# Patient Record
Sex: Female | Born: 1960 | Race: White | Hispanic: No | Marital: Married | Smoking: Never smoker
Health system: Southern US, Community
[De-identification: ages and names within clinical notes are randomized; demographics above are authoritative.]

## PROBLEM LIST (undated history)

## (undated) ENCOUNTER — Emergency Department (HOSPITAL_COMMUNITY): Admission: EM | Disposition: A | Discharge status: Other Acute Inpatient Hospital | Payer: Self-pay

## (undated) DIAGNOSIS — S46002A Unspecified injury of muscle(s) and tendon(s) of the rotator cuff of left shoulder, initial encounter: Secondary | ICD-10-CM

## (undated) DIAGNOSIS — M545 Low back pain: Secondary | ICD-10-CM

## (undated) DIAGNOSIS — Q676 Pectus excavatum: Secondary | ICD-10-CM

## (undated) DIAGNOSIS — K219 Gastro-esophageal reflux disease without esophagitis: Secondary | ICD-10-CM

## (undated) DIAGNOSIS — E669 Obesity, unspecified: Secondary | ICD-10-CM

## (undated) DIAGNOSIS — Z9289 Personal history of other medical treatment: Secondary | ICD-10-CM

## (undated) DIAGNOSIS — Z86010 Personal history of colonic polyps: Secondary | ICD-10-CM

## (undated) DIAGNOSIS — S39012A Strain of muscle, fascia and tendon of lower back, initial encounter: Secondary | ICD-10-CM

## (undated) DIAGNOSIS — Z806 Family history of leukemia: Secondary | ICD-10-CM

## (undated) DIAGNOSIS — G4700 Insomnia, unspecified: Secondary | ICD-10-CM

## (undated) DIAGNOSIS — N179 Acute kidney failure, unspecified: Secondary | ICD-10-CM

## (undated) DIAGNOSIS — I10 Essential (primary) hypertension: Secondary | ICD-10-CM

## (undated) DIAGNOSIS — N3942 Incontinence without sensory awareness: Secondary | ICD-10-CM

## (undated) DIAGNOSIS — J320 Chronic maxillary sinusitis: Secondary | ICD-10-CM

## (undated) DIAGNOSIS — M1990 Unspecified osteoarthritis, unspecified site: Secondary | ICD-10-CM

## (undated) DIAGNOSIS — N183 Chronic kidney disease, stage 3 unspecified: Secondary | ICD-10-CM

## (undated) DIAGNOSIS — K5792 Diverticulitis of intestine, part unspecified, without perforation or abscess without bleeding: Secondary | ICD-10-CM

## (undated) DIAGNOSIS — F410 Panic disorder [episodic paroxysmal anxiety] without agoraphobia: Secondary | ICD-10-CM

## (undated) DIAGNOSIS — R7303 Prediabetes: Secondary | ICD-10-CM

## (undated) DIAGNOSIS — E041 Nontoxic single thyroid nodule: Secondary | ICD-10-CM

## (undated) DIAGNOSIS — Z9089 Acquired absence of other organs: Secondary | ICD-10-CM

## (undated) DIAGNOSIS — R110 Nausea: Secondary | ICD-10-CM

## (undated) DIAGNOSIS — H8110 Benign paroxysmal vertigo, unspecified ear: Secondary | ICD-10-CM

## (undated) DIAGNOSIS — R112 Nausea with vomiting, unspecified: Secondary | ICD-10-CM

## (undated) DIAGNOSIS — Z0000 Encounter for general adult medical examination without abnormal findings: Secondary | ICD-10-CM

## (undated) DIAGNOSIS — K611 Rectal abscess: Secondary | ICD-10-CM

## (undated) DIAGNOSIS — I872 Venous insufficiency (chronic) (peripheral): Secondary | ICD-10-CM

## (undated) DIAGNOSIS — E039 Hypothyroidism, unspecified: Secondary | ICD-10-CM

## (undated) DIAGNOSIS — I209 Angina pectoris, unspecified: Secondary | ICD-10-CM

## (undated) DIAGNOSIS — R809 Proteinuria, unspecified: Secondary | ICD-10-CM

## (undated) DIAGNOSIS — E781 Pure hyperglyceridemia: Secondary | ICD-10-CM

## (undated) DIAGNOSIS — F32A Depression, unspecified: Secondary | ICD-10-CM

## (undated) DIAGNOSIS — F329 Major depressive disorder, single episode, unspecified: Secondary | ICD-10-CM

## (undated) DIAGNOSIS — G8929 Other chronic pain: Secondary | ICD-10-CM

## (undated) DIAGNOSIS — D509 Iron deficiency anemia, unspecified: Secondary | ICD-10-CM

## (undated) DIAGNOSIS — K760 Fatty (change of) liver, not elsewhere classified: Secondary | ICD-10-CM

## (undated) DIAGNOSIS — E8881 Metabolic syndrome: Secondary | ICD-10-CM

## (undated) DIAGNOSIS — Z8614 Personal history of Methicillin resistant Staphylococcus aureus infection: Secondary | ICD-10-CM

## (undated) DIAGNOSIS — R197 Diarrhea, unspecified: Secondary | ICD-10-CM

## (undated) HISTORY — DX: Essential (primary) hypertension: I10

## (undated) HISTORY — DX: Major depressive disorder, single episode, unspecified: F32.9

## (undated) HISTORY — DX: Insomnia, unspecified: G47.00

## (undated) HISTORY — DX: Chronic kidney disease, stage 3 (moderate): N18.3

## (undated) HISTORY — DX: Fatty (change of) liver, not elsewhere classified: K76.0

## (undated) HISTORY — DX: Chronic maxillary sinusitis: J32.0

## (undated) HISTORY — DX: Proteinuria, unspecified: R80.9

## (undated) HISTORY — DX: Nontoxic single thyroid nodule: E04.1

## (undated) HISTORY — DX: Iron deficiency anemia, unspecified: D50.9

## (undated) HISTORY — DX: Diarrhea, unspecified: R19.7

## (undated) HISTORY — DX: Low back pain: M54.5

## (undated) HISTORY — DX: Personal history of colonic polyps: Z86.010

## (undated) HISTORY — DX: Prediabetes: R73.03

## (undated) HISTORY — DX: Pectus excavatum: Q67.6

## (undated) HISTORY — DX: Encounter for general adult medical examination without abnormal findings: Z00.00

## (undated) HISTORY — DX: Nausea with vomiting, unspecified: R11.2

## (undated) HISTORY — DX: Pure hyperglyceridemia: E78.1

## (undated) HISTORY — DX: Hypothyroidism, unspecified: E03.9

## (undated) HISTORY — DX: Family history of leukemia: Z80.6

## (undated) HISTORY — DX: Venous insufficiency (chronic) (peripheral): I87.2

## (undated) HISTORY — DX: Benign paroxysmal vertigo, unspecified ear: H81.10

## (undated) HISTORY — DX: Chronic kidney disease, stage 3 unspecified: N18.30

## (undated) HISTORY — DX: Obesity, unspecified: E66.9

## (undated) HISTORY — DX: Panic disorder (episodic paroxysmal anxiety): F41.0

## (undated) HISTORY — DX: Unspecified injury of muscle(s) and tendon(s) of the rotator cuff of left shoulder, initial encounter: S46.002A

## (undated) HISTORY — DX: Other chronic pain: G89.29

## (undated) HISTORY — DX: Strain of muscle, fascia and tendon of lower back, initial encounter: S39.012A

## (undated) HISTORY — DX: Incontinence without sensory awareness: N39.42

## (undated) HISTORY — DX: Metabolic syndrome: E88.81

## (undated) HISTORY — DX: Acute kidney failure, unspecified: N17.9

## (undated) HISTORY — DX: Nausea: R11.0

## (undated) HISTORY — DX: Acquired absence of other organs: Z90.89

## (undated) HISTORY — DX: Rectal abscess: K61.1

## (undated) HISTORY — DX: Depression, unspecified: F32.A

---

## 1985-10-12 HISTORY — PX: DILATION AND CURETTAGE OF UTERUS: SHX78

## 1998-05-12 ENCOUNTER — Emergency Department (HOSPITAL_COMMUNITY): Admission: EM | Admit: 1998-05-12 | Discharge: 1998-05-12 | Payer: Self-pay | Admitting: *Deleted

## 1998-11-02 ENCOUNTER — Emergency Department (HOSPITAL_COMMUNITY): Admission: EM | Admit: 1998-11-02 | Discharge: 1998-11-02 | Payer: Self-pay | Admitting: Emergency Medicine

## 1999-04-08 ENCOUNTER — Emergency Department (HOSPITAL_COMMUNITY): Admission: EM | Admit: 1999-04-08 | Discharge: 1999-04-08 | Payer: Self-pay

## 1999-04-10 ENCOUNTER — Emergency Department (HOSPITAL_COMMUNITY): Admission: EM | Admit: 1999-04-10 | Discharge: 1999-04-10 | Payer: Self-pay | Admitting: Emergency Medicine

## 1999-07-26 ENCOUNTER — Inpatient Hospital Stay (HOSPITAL_COMMUNITY): Admission: EM | Admit: 1999-07-26 | Discharge: 1999-07-27 | Payer: Self-pay | Admitting: Emergency Medicine

## 1999-07-26 ENCOUNTER — Encounter (INDEPENDENT_AMBULATORY_CARE_PROVIDER_SITE_OTHER): Payer: Self-pay | Admitting: Specialist

## 1999-07-26 ENCOUNTER — Encounter: Payer: Self-pay | Admitting: Emergency Medicine

## 1999-07-30 HISTORY — PX: CHOLECYSTECTOMY: SHX55

## 2000-01-09 ENCOUNTER — Encounter: Payer: Self-pay | Admitting: *Deleted

## 2000-01-09 ENCOUNTER — Emergency Department (HOSPITAL_COMMUNITY): Admission: EM | Admit: 2000-01-09 | Discharge: 2000-01-09 | Payer: Self-pay | Admitting: *Deleted

## 2000-04-07 ENCOUNTER — Emergency Department (HOSPITAL_COMMUNITY): Admission: EM | Admit: 2000-04-07 | Discharge: 2000-04-07 | Payer: Self-pay | Admitting: Emergency Medicine

## 2000-04-07 ENCOUNTER — Encounter: Payer: Self-pay | Admitting: Emergency Medicine

## 2000-06-09 ENCOUNTER — Emergency Department (HOSPITAL_COMMUNITY): Admission: EM | Admit: 2000-06-09 | Discharge: 2000-06-09 | Payer: Self-pay | Admitting: Emergency Medicine

## 2000-06-21 ENCOUNTER — Encounter: Payer: Self-pay | Admitting: Surgery

## 2000-06-21 ENCOUNTER — Encounter (INDEPENDENT_AMBULATORY_CARE_PROVIDER_SITE_OTHER): Payer: Self-pay | Admitting: Internal Medicine

## 2000-06-21 ENCOUNTER — Encounter: Admission: RE | Admit: 2000-06-21 | Discharge: 2000-06-21 | Payer: Self-pay | Admitting: Surgery

## 2000-07-04 ENCOUNTER — Emergency Department (HOSPITAL_COMMUNITY): Admission: EM | Admit: 2000-07-04 | Discharge: 2000-07-04 | Payer: Self-pay | Admitting: Emergency Medicine

## 2001-05-28 ENCOUNTER — Emergency Department (HOSPITAL_COMMUNITY): Admission: EM | Admit: 2001-05-28 | Discharge: 2001-05-28 | Payer: Self-pay | Admitting: Emergency Medicine

## 2001-06-22 ENCOUNTER — Encounter: Payer: Self-pay | Admitting: Emergency Medicine

## 2001-06-22 ENCOUNTER — Emergency Department (HOSPITAL_COMMUNITY): Admission: EM | Admit: 2001-06-22 | Discharge: 2001-06-23 | Payer: Self-pay | Admitting: Emergency Medicine

## 2002-03-29 ENCOUNTER — Emergency Department (HOSPITAL_COMMUNITY): Admission: EM | Admit: 2002-03-29 | Discharge: 2002-03-29 | Payer: Self-pay | Admitting: Emergency Medicine

## 2002-03-29 ENCOUNTER — Encounter: Payer: Self-pay | Admitting: Emergency Medicine

## 2003-04-24 ENCOUNTER — Emergency Department (HOSPITAL_COMMUNITY): Admission: EM | Admit: 2003-04-24 | Discharge: 2003-04-24 | Payer: Self-pay | Admitting: Emergency Medicine

## 2003-05-05 ENCOUNTER — Ambulatory Visit (HOSPITAL_COMMUNITY): Admission: RE | Admit: 2003-05-05 | Discharge: 2003-05-05 | Payer: Self-pay | Admitting: Sports Medicine

## 2003-05-05 ENCOUNTER — Encounter: Payer: Self-pay | Admitting: Sports Medicine

## 2004-07-22 ENCOUNTER — Emergency Department (HOSPITAL_COMMUNITY): Admission: EM | Admit: 2004-07-22 | Discharge: 2004-07-22 | Payer: Self-pay | Admitting: Emergency Medicine

## 2004-08-12 DIAGNOSIS — F410 Panic disorder [episodic paroxysmal anxiety] without agoraphobia: Secondary | ICD-10-CM

## 2004-08-12 HISTORY — DX: Panic disorder (episodic paroxysmal anxiety): F41.0

## 2004-08-18 ENCOUNTER — Ambulatory Visit: Payer: Self-pay | Admitting: Internal Medicine

## 2004-12-12 ENCOUNTER — Ambulatory Visit: Payer: Self-pay | Admitting: Internal Medicine

## 2005-01-08 ENCOUNTER — Ambulatory Visit: Payer: Self-pay | Admitting: Internal Medicine

## 2005-01-12 ENCOUNTER — Encounter: Admission: RE | Admit: 2005-01-12 | Discharge: 2005-04-12 | Payer: Self-pay | Admitting: Infectious Diseases

## 2005-03-13 ENCOUNTER — Ambulatory Visit: Payer: Self-pay | Admitting: Internal Medicine

## 2005-05-18 ENCOUNTER — Emergency Department (HOSPITAL_COMMUNITY): Admission: EM | Admit: 2005-05-18 | Discharge: 2005-05-18 | Payer: Self-pay | Admitting: Family Medicine

## 2005-06-18 ENCOUNTER — Ambulatory Visit: Payer: Self-pay | Admitting: Internal Medicine

## 2006-01-07 ENCOUNTER — Ambulatory Visit: Payer: Self-pay | Admitting: Internal Medicine

## 2006-02-23 ENCOUNTER — Ambulatory Visit: Payer: Self-pay | Admitting: Internal Medicine

## 2006-03-17 ENCOUNTER — Emergency Department (HOSPITAL_COMMUNITY): Admission: EM | Admit: 2006-03-17 | Discharge: 2006-03-17 | Payer: Self-pay | Admitting: Family Medicine

## 2006-07-02 ENCOUNTER — Ambulatory Visit: Payer: Self-pay | Admitting: Hospitalist

## 2006-08-09 ENCOUNTER — Ambulatory Visit: Payer: Self-pay

## 2006-08-31 ENCOUNTER — Ambulatory Visit: Payer: Self-pay | Admitting: Internal Medicine

## 2006-08-31 DIAGNOSIS — F4323 Adjustment disorder with mixed anxiety and depressed mood: Secondary | ICD-10-CM

## 2006-08-31 DIAGNOSIS — F410 Panic disorder [episodic paroxysmal anxiety] without agoraphobia: Secondary | ICD-10-CM

## 2006-08-31 DIAGNOSIS — R0789 Other chest pain: Secondary | ICD-10-CM

## 2006-08-31 DIAGNOSIS — E781 Pure hyperglyceridemia: Secondary | ICD-10-CM

## 2006-08-31 DIAGNOSIS — E669 Obesity, unspecified: Secondary | ICD-10-CM

## 2006-08-31 DIAGNOSIS — Z9089 Acquired absence of other organs: Secondary | ICD-10-CM

## 2006-08-31 HISTORY — DX: Obesity, unspecified: E66.9

## 2006-08-31 HISTORY — DX: Panic disorder (episodic paroxysmal anxiety): F41.0

## 2007-02-06 ENCOUNTER — Emergency Department (HOSPITAL_COMMUNITY): Admission: EM | Admit: 2007-02-06 | Discharge: 2007-02-06 | Payer: Self-pay | Admitting: Family Medicine

## 2007-02-22 ENCOUNTER — Emergency Department (HOSPITAL_COMMUNITY): Admission: EM | Admit: 2007-02-22 | Discharge: 2007-02-22 | Payer: Self-pay | Admitting: Family Medicine

## 2007-02-24 ENCOUNTER — Emergency Department (HOSPITAL_COMMUNITY): Admission: EM | Admit: 2007-02-24 | Discharge: 2007-02-24 | Payer: Self-pay | Admitting: Family Medicine

## 2007-02-26 ENCOUNTER — Emergency Department (HOSPITAL_COMMUNITY): Admission: EM | Admit: 2007-02-26 | Discharge: 2007-02-26 | Payer: Self-pay | Admitting: Family Medicine

## 2007-04-12 HISTORY — PX: ABDOMINAL HYSTERECTOMY: SHX81

## 2007-04-22 ENCOUNTER — Emergency Department (HOSPITAL_COMMUNITY): Admission: EM | Admit: 2007-04-22 | Discharge: 2007-04-22 | Payer: Self-pay | Admitting: Family Medicine

## 2007-09-15 ENCOUNTER — Emergency Department (HOSPITAL_COMMUNITY): Admission: EM | Admit: 2007-09-15 | Discharge: 2007-09-15 | Payer: Self-pay | Admitting: Emergency Medicine

## 2007-10-03 ENCOUNTER — Telehealth: Payer: Self-pay | Admitting: *Deleted

## 2007-11-02 ENCOUNTER — Telehealth (INDEPENDENT_AMBULATORY_CARE_PROVIDER_SITE_OTHER): Payer: Self-pay | Admitting: *Deleted

## 2008-02-05 ENCOUNTER — Emergency Department (HOSPITAL_COMMUNITY): Admission: EM | Admit: 2008-02-05 | Discharge: 2008-02-05 | Payer: Self-pay | Admitting: Family Medicine

## 2008-02-22 ENCOUNTER — Emergency Department (HOSPITAL_COMMUNITY): Admission: EM | Admit: 2008-02-22 | Discharge: 2008-02-22 | Payer: Self-pay | Admitting: Emergency Medicine

## 2008-02-23 ENCOUNTER — Ambulatory Visit: Payer: Self-pay | Admitting: Internal Medicine

## 2008-02-25 ENCOUNTER — Emergency Department (HOSPITAL_COMMUNITY): Admission: EM | Admit: 2008-02-25 | Discharge: 2008-02-25 | Payer: Self-pay | Admitting: Family Medicine

## 2008-02-29 ENCOUNTER — Emergency Department (HOSPITAL_COMMUNITY): Admission: EM | Admit: 2008-02-29 | Discharge: 2008-02-29 | Payer: Self-pay | Admitting: Emergency Medicine

## 2008-03-12 DIAGNOSIS — Z9289 Personal history of other medical treatment: Secondary | ICD-10-CM

## 2008-03-12 HISTORY — DX: Personal history of other medical treatment: Z92.89

## 2008-03-27 ENCOUNTER — Inpatient Hospital Stay (HOSPITAL_COMMUNITY): Admission: EM | Admit: 2008-03-27 | Discharge: 2008-03-28 | Payer: Self-pay | Admitting: Emergency Medicine

## 2008-04-05 ENCOUNTER — Ambulatory Visit: Payer: Self-pay | Admitting: Internal Medicine

## 2008-04-05 ENCOUNTER — Encounter: Payer: Self-pay | Admitting: Internal Medicine

## 2008-04-05 DIAGNOSIS — D509 Iron deficiency anemia, unspecified: Secondary | ICD-10-CM

## 2008-04-05 DIAGNOSIS — E039 Hypothyroidism, unspecified: Secondary | ICD-10-CM

## 2008-04-05 HISTORY — DX: Iron deficiency anemia, unspecified: D50.9

## 2008-04-08 LAB — CONVERTED CEMR LAB
ALT: 16 units/L (ref 0–35)
Alkaline Phosphatase: 68 units/L (ref 39–117)
Basophils Relative: 1 % (ref 0–1)
CO2: 26 meq/L (ref 19–32)
Cholesterol: 158 mg/dL (ref 0–200)
Creatinine, Ser: 0.9 mg/dL (ref 0.40–1.20)
Eosinophils Absolute: 0.2 10*3/uL (ref 0.0–0.7)
Eosinophils Relative: 3 % (ref 0–5)
HDL: 39 mg/dL — ABNORMAL LOW (ref >39)
Homocysteine: 10.5 micromoles/L (ref 4.0–15.4)
Lymphs Abs: 1 10*3/uL (ref 0.7–4.0)
MCHC: 34.1 g/dL (ref 30.0–36.0)
MCV: 73 fL — ABNORMAL LOW (ref 78.0–100.0)
Monocytes Relative: 10 % (ref 3–12)
Platelets: 273 10*3/uL (ref 150–400)
TSH: 10.046 microintl units/mL — ABNORMAL HIGH (ref 0.350–5.50)
Total Bilirubin: 0.8 mg/dL (ref 0.3–1.2)
Total CHOL/HDL Ratio: 4.1
VLDL: 25 mg/dL (ref 0–40)
WBC: 7 10*3/uL (ref 4.0–10.5)

## 2008-04-11 ENCOUNTER — Ambulatory Visit: Payer: Self-pay | Admitting: Gynecology

## 2008-04-11 ENCOUNTER — Other Ambulatory Visit: Admission: RE | Admit: 2008-04-11 | Discharge: 2008-04-11 | Payer: Self-pay | Admitting: Gynecology

## 2008-04-11 ENCOUNTER — Encounter (INDEPENDENT_AMBULATORY_CARE_PROVIDER_SITE_OTHER): Payer: Self-pay | Admitting: Gynecology

## 2008-04-11 LAB — HM PAP SMEAR: HM Pap smear: NEGATIVE

## 2008-04-19 ENCOUNTER — Ambulatory Visit: Payer: Self-pay | Admitting: Internal Medicine

## 2008-05-01 ENCOUNTER — Inpatient Hospital Stay (HOSPITAL_COMMUNITY): Admission: RE | Admit: 2008-05-01 | Discharge: 2008-05-03 | Payer: Self-pay | Admitting: Gynecology

## 2008-05-01 ENCOUNTER — Encounter (INDEPENDENT_AMBULATORY_CARE_PROVIDER_SITE_OTHER): Payer: Self-pay | Admitting: Gynecology

## 2008-05-01 ENCOUNTER — Ambulatory Visit: Payer: Self-pay | Admitting: Gynecology

## 2008-05-10 ENCOUNTER — Ambulatory Visit: Payer: Self-pay | Admitting: *Deleted

## 2008-05-21 ENCOUNTER — Inpatient Hospital Stay (HOSPITAL_COMMUNITY): Admission: AD | Admit: 2008-05-21 | Discharge: 2008-05-21 | Payer: Self-pay | Admitting: Obstetrics & Gynecology

## 2008-05-21 ENCOUNTER — Ambulatory Visit: Payer: Self-pay | Admitting: Physician Assistant

## 2008-06-03 ENCOUNTER — Emergency Department (HOSPITAL_COMMUNITY): Admission: EM | Admit: 2008-06-03 | Discharge: 2008-06-03 | Payer: Self-pay | Admitting: Emergency Medicine

## 2008-06-14 ENCOUNTER — Ambulatory Visit: Payer: Self-pay | Admitting: Obstetrics and Gynecology

## 2008-06-27 ENCOUNTER — Emergency Department (HOSPITAL_COMMUNITY): Admission: EM | Admit: 2008-06-27 | Discharge: 2008-06-27 | Payer: Self-pay | Admitting: Family Medicine

## 2008-07-03 ENCOUNTER — Encounter (INDEPENDENT_AMBULATORY_CARE_PROVIDER_SITE_OTHER): Payer: Self-pay | Admitting: Internal Medicine

## 2008-07-10 ENCOUNTER — Ambulatory Visit: Payer: Self-pay | Admitting: Internal Medicine

## 2008-07-12 ENCOUNTER — Telehealth: Payer: Self-pay | Admitting: *Deleted

## 2008-08-31 ENCOUNTER — Emergency Department (HOSPITAL_COMMUNITY): Admission: EM | Admit: 2008-08-31 | Discharge: 2008-08-31 | Payer: Self-pay | Admitting: Emergency Medicine

## 2008-11-28 ENCOUNTER — Ambulatory Visit: Payer: Self-pay | Admitting: Internal Medicine

## 2008-11-28 ENCOUNTER — Encounter (INDEPENDENT_AMBULATORY_CARE_PROVIDER_SITE_OTHER): Payer: Self-pay | Admitting: Internal Medicine

## 2008-11-28 DIAGNOSIS — R030 Elevated blood-pressure reading, without diagnosis of hypertension: Secondary | ICD-10-CM | POA: Insufficient documentation

## 2008-12-24 LAB — CONVERTED CEMR LAB
AST: 22 units/L (ref 0–37)
Albumin: 4 g/dL (ref 3.5–5.2)
Basophils Absolute: 0 10*3/uL (ref 0.0–0.1)
Basophils Relative: 1 % (ref 0–1)
Calcium: 9.2 mg/dL (ref 8.4–10.5)
Creatinine, Ser: 1.02 mg/dL (ref 0.40–1.20)
Eosinophils Absolute: 0.1 10*3/uL (ref 0.0–0.7)
Glucose, Bld: 98 mg/dL (ref 70–99)
HCT: 39.9 % (ref 36.0–46.0)
Hemoglobin: 12.1 g/dL (ref 12.0–15.0)
MCV: 85.4 fL (ref 78.0–100.0)
Neutro Abs: 4.3 10*3/uL (ref 1.7–7.7)
Neutrophils Relative %: 65 % (ref 43–77)
Platelets: 270 10*3/uL (ref 150–400)
Rhuematoid fact SerPl-aCnc: <20 intl units/mL (ref 0–20)
Total Bilirubin: 0.5 mg/dL (ref 0.3–1.2)
Total Protein: 7.4 g/dL (ref 6.0–8.3)
WBC: 6.6 10*3/uL (ref 4.0–10.5)

## 2008-12-27 ENCOUNTER — Ambulatory Visit: Payer: Self-pay | Admitting: Internal Medicine

## 2009-01-18 ENCOUNTER — Ambulatory Visit: Payer: Self-pay | Admitting: Internal Medicine

## 2009-02-04 ENCOUNTER — Emergency Department (HOSPITAL_COMMUNITY): Admission: EM | Admit: 2009-02-04 | Discharge: 2009-02-04 | Payer: Self-pay | Admitting: Emergency Medicine

## 2009-02-13 ENCOUNTER — Emergency Department (HOSPITAL_COMMUNITY): Admission: EM | Admit: 2009-02-13 | Discharge: 2009-02-13 | Payer: Self-pay | Admitting: Emergency Medicine

## 2009-02-15 ENCOUNTER — Ambulatory Visit: Payer: Self-pay | Admitting: Infectious Disease

## 2009-02-15 ENCOUNTER — Encounter (INDEPENDENT_AMBULATORY_CARE_PROVIDER_SITE_OTHER): Payer: Self-pay | Admitting: Internal Medicine

## 2009-02-15 LAB — CONVERTED CEMR LAB
ALT: 25 units/L (ref 0–35)
AST: 19 units/L (ref 0–37)
Albumin: 3.5 g/dL (ref 3.5–5.2)
Alkaline Phosphatase: 100 units/L (ref 39–117)
Basophils Absolute: 0.1 10*3/uL (ref 0.0–0.1)
Bilirubin Urine: NEGATIVE
Calcium: 9 mg/dL (ref 8.4–10.5)
Chloride: 105 meq/L (ref 96–112)
Creatinine, Ser: 0.97 mg/dL (ref 0.40–1.20)
Eosinophils Relative: 2 % (ref 0–5)
Glucose, Bld: 95 mg/dL (ref 70–99)
Hemoglobin: 12.8 g/dL (ref 12.0–15.0)
Leukocytes, UA: NEGATIVE
Lymphs Abs: 1.5 10*3/uL (ref 0.7–4.0)
Monocytes Relative: 7 % (ref 3–12)
Neutro Abs: 4.4 10*3/uL (ref 1.7–7.7)
Neutrophils Relative %: 67 % (ref 43–77)
Potassium: 4 meq/L (ref 3.5–5.3)
RDW: 15.9 % — ABNORMAL HIGH (ref 11.5–15.5)
Total Protein: 7 g/dL (ref 6.0–8.3)
Urine Glucose: NEGATIVE mg/dL
WBC: 6.6 10*3/uL (ref 4.0–10.5)

## 2009-02-20 ENCOUNTER — Ambulatory Visit: Payer: Self-pay | Admitting: *Deleted

## 2009-02-20 ENCOUNTER — Encounter (INDEPENDENT_AMBULATORY_CARE_PROVIDER_SITE_OTHER): Payer: Self-pay | Admitting: *Deleted

## 2009-02-20 LAB — CONVERTED CEMR LAB
BUN: 17 mg/dL (ref 6–23)
Basophils Absolute: 0 10*3/uL (ref 0.0–0.1)
Basophils Relative: 0 % (ref 0–1)
Calcium: 8.9 mg/dL (ref 8.4–10.5)
Chloride: 102 meq/L (ref 96–112)
Eosinophils Absolute: 0.2 10*3/uL (ref 0.0–0.7)
Eosinophils Relative: 2 % (ref 0–5)
GFR calc non Af Amer: >60 mL/min (ref >60)
Glucose, Bld: 90 mg/dL (ref 70–99)
HCT: 41 % (ref 36.0–46.0)
Hemoglobin: 13.3 g/dL (ref 12.0–15.0)
Lymphs Abs: 1.5 10*3/uL (ref 0.7–4.0)
MCHC: 32.4 g/dL (ref 30.0–36.0)
Monocytes Absolute: 0.4 10*3/uL (ref 0.1–1.0)
Monocytes Relative: 6 % (ref 3–12)
Neutro Abs: 5.4 10*3/uL (ref 1.7–7.7)
Sodium: 138 meq/L (ref 135–145)

## 2009-03-06 ENCOUNTER — Emergency Department (HOSPITAL_COMMUNITY): Admission: EM | Admit: 2009-03-06 | Discharge: 2009-03-06 | Payer: Self-pay | Admitting: Family Medicine

## 2009-03-15 ENCOUNTER — Encounter (INDEPENDENT_AMBULATORY_CARE_PROVIDER_SITE_OTHER): Payer: Self-pay | Admitting: Internal Medicine

## 2009-03-15 ENCOUNTER — Ambulatory Visit: Payer: Self-pay | Admitting: Internal Medicine

## 2009-03-21 LAB — CONVERTED CEMR LAB
Free T4: 1.02 ng/dL (ref 0.80–1.80)
HDL: 38 mg/dL — ABNORMAL LOW (ref >39)
TSH: 8.431 microintl units/mL — ABNORMAL HIGH (ref 0.350–4.500)
VLDL: 49 mg/dL — ABNORMAL HIGH (ref 0–40)

## 2009-06-06 ENCOUNTER — Emergency Department (HOSPITAL_COMMUNITY): Admission: EM | Admit: 2009-06-06 | Discharge: 2009-06-06 | Payer: Self-pay | Admitting: Family Medicine

## 2009-06-14 ENCOUNTER — Ambulatory Visit: Payer: Self-pay | Admitting: Infectious Diseases

## 2009-06-14 DIAGNOSIS — IMO0001 Reserved for inherently not codable concepts without codable children: Secondary | ICD-10-CM

## 2009-06-21 IMAGING — CT CT HEAD W/O CM
1 of 2 series · 13 of 30 positions shown, 17 images · non-contrast
Comparison: None available.

CLINICAL DATA: Headache.  Neck pain.

CT HEAD WITHOUT CONTRAST
TECHNIQUE: Contiguous axial images were obtained from the base of
the skull through the vertex without contrast.

[Series 2: brain · axial · 0.47mm/px · z∈[+123,+259]mm · 13 of 32 slices shown, 17 images]
[im 3/32  brain]
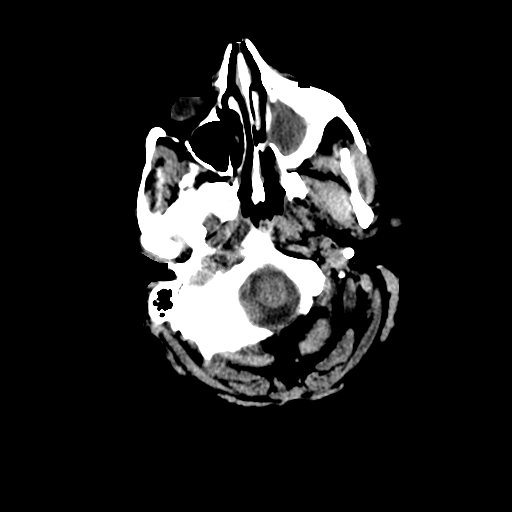
[im 3/32  bone]
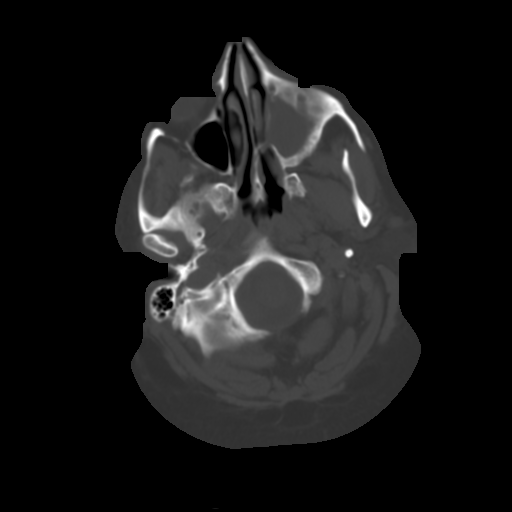
[im 5/32  brain]
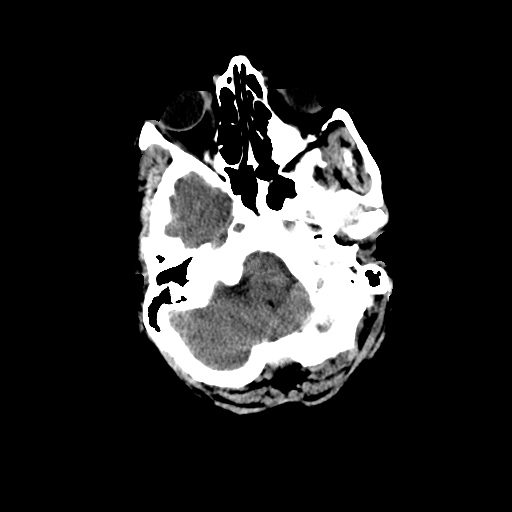
[im 7/32  brain]
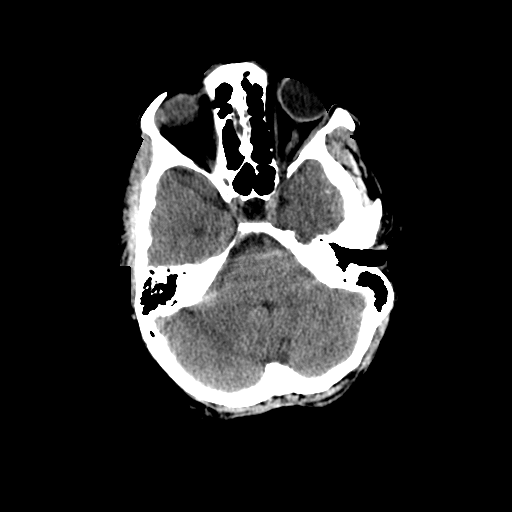
[im 9/32  brain]
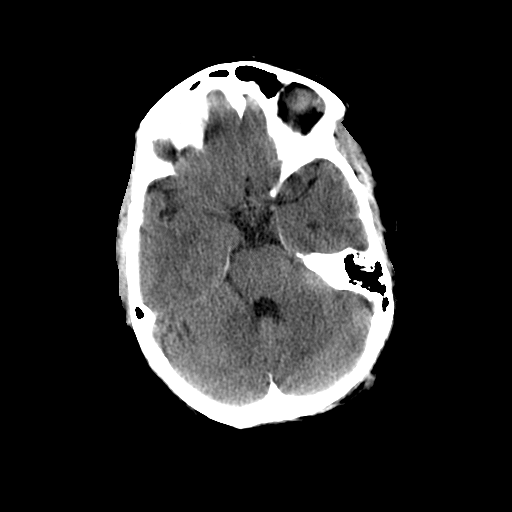
[im 12/32  brain]
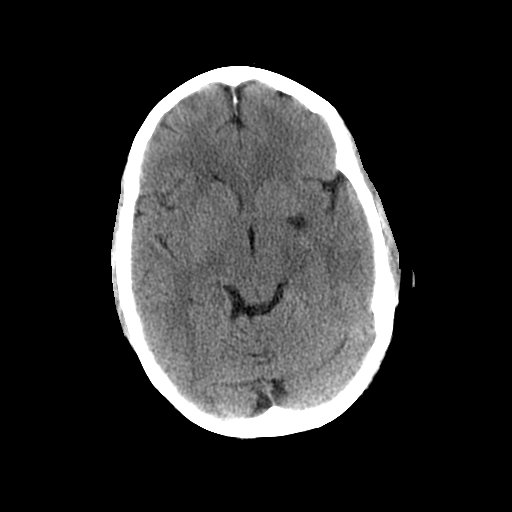
[im 12/32  bone]
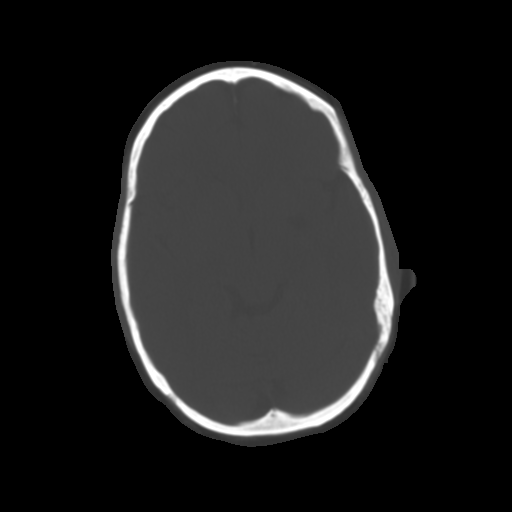
[im 14/32  brain]
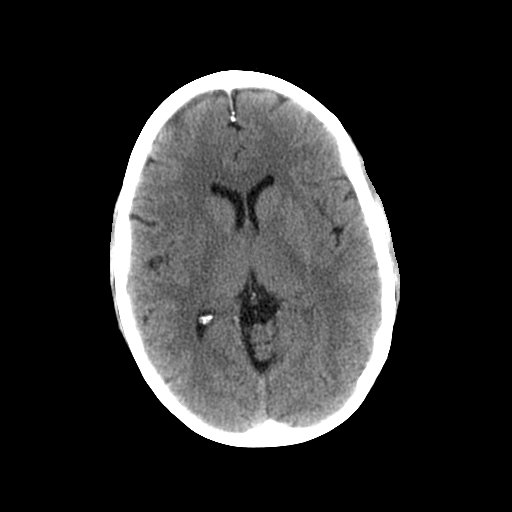
[im 16/32  brain]
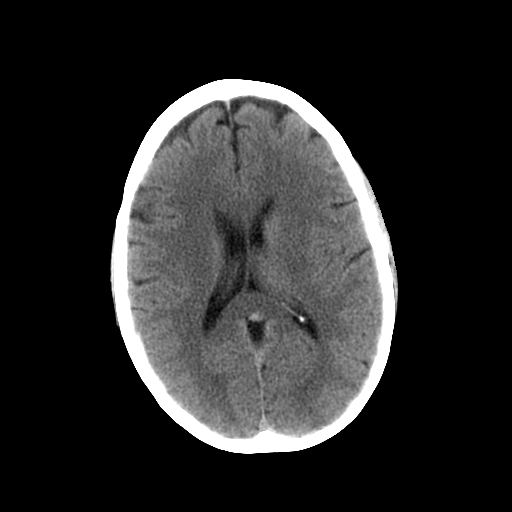
[im 18/32  brain]
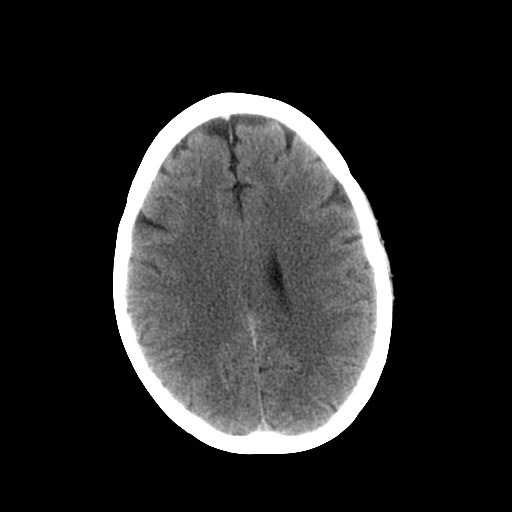
[im 20/32  brain]
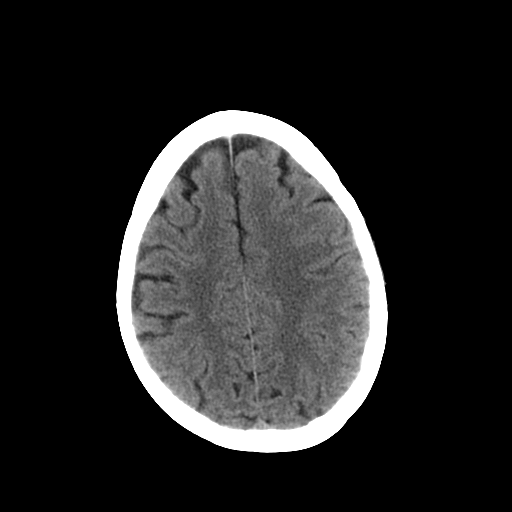
[im 20/32  bone]
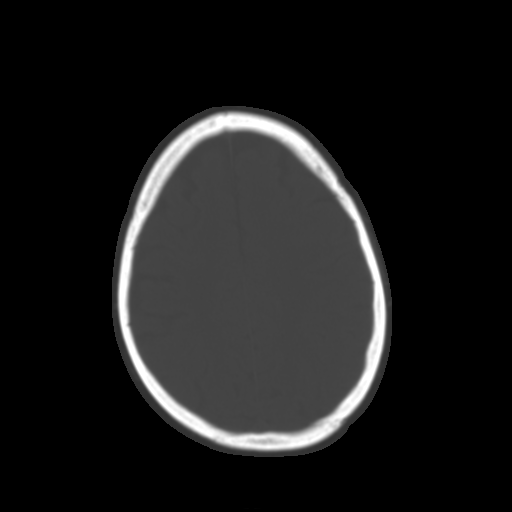
[im 23/32  brain]
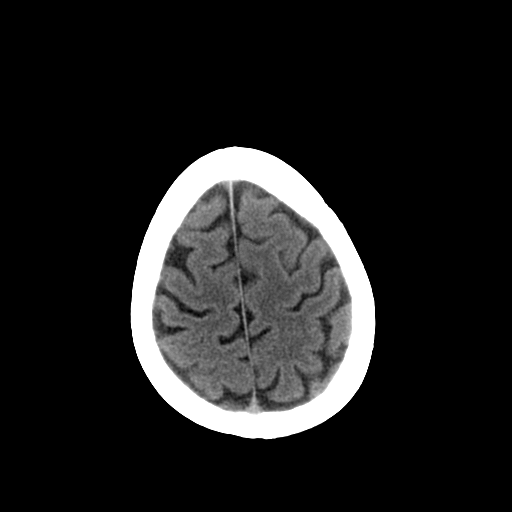
[im 25/32  brain]
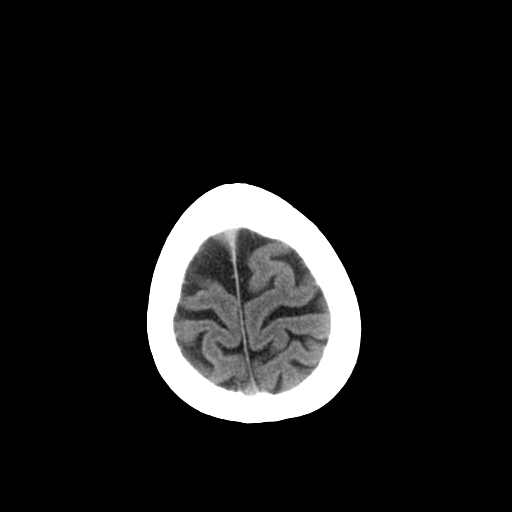
[im 27/32  brain]
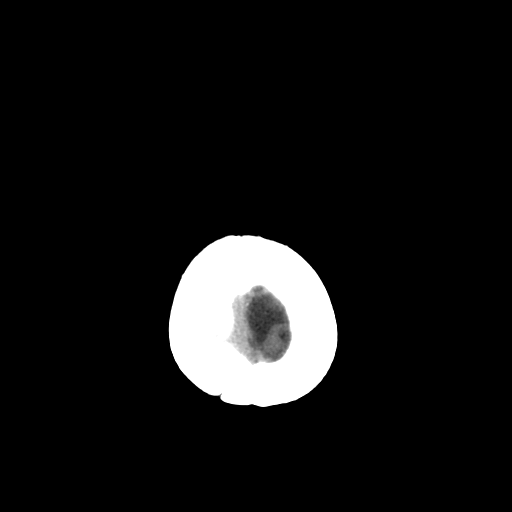
[im 29/32  brain]
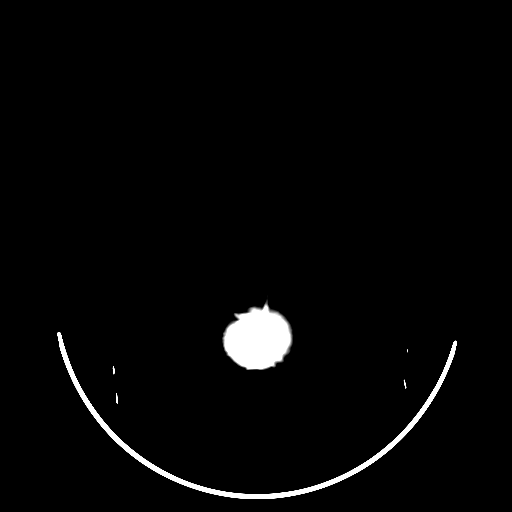
[im 29/32  bone]
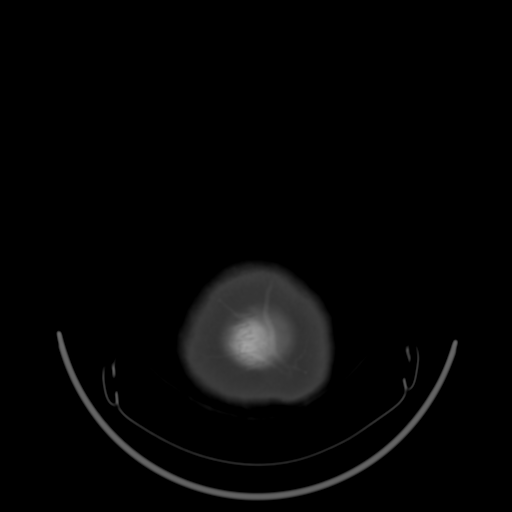

[13 of 30 positions shown; findings below may reference images not displayed]

FINDINGS: The brain appears normal without evidence of acute
infarction, hemorrhage, mass lesion, mass effect, midline shift or
abnormal extra-axial fluid collection.  No hydrocephalus.  There is
complete opacification of the left maxillary sinus with the walls
of the sinus thickened and sclerosed.  Imaged paranasal sinuses
mastoid air cells otherwise clear.
IMPRESSION: 1.  No acute intracranial abnormality.
2.  Chronic left maxillary sinus disease.

## 2009-07-10 ENCOUNTER — Emergency Department (HOSPITAL_COMMUNITY): Admission: EM | Admit: 2009-07-10 | Discharge: 2009-07-10 | Payer: Self-pay | Admitting: Emergency Medicine

## 2009-12-06 ENCOUNTER — Ambulatory Visit: Payer: Self-pay | Admitting: Internal Medicine

## 2009-12-06 DIAGNOSIS — J301 Allergic rhinitis due to pollen: Secondary | ICD-10-CM

## 2009-12-14 LAB — CONVERTED CEMR LAB
ALT: 18 units/L (ref 0–35)
AST: 15 units/L (ref 0–37)
Alkaline Phosphatase: 99 units/L (ref 39–117)
Calcium: 9.2 mg/dL (ref 8.4–10.5)
Cholesterol: 181 mg/dL (ref 0–200)
Creatinine, Ser: 0.96 mg/dL (ref 0.40–1.20)
Glucose, Bld: 96 mg/dL (ref 70–99)
Potassium: 4.3 meq/L (ref 3.5–5.3)
Total CHOL/HDL Ratio: 4.9
Triglycerides: 164 mg/dL — ABNORMAL HIGH (ref <150)

## 2010-03-28 ENCOUNTER — Telehealth (INDEPENDENT_AMBULATORY_CARE_PROVIDER_SITE_OTHER): Payer: Self-pay | Admitting: *Deleted

## 2010-03-28 ENCOUNTER — Ambulatory Visit: Payer: Self-pay | Admitting: Internal Medicine

## 2010-03-28 ENCOUNTER — Ambulatory Visit (HOSPITAL_COMMUNITY): Admission: RE | Admit: 2010-03-28 | Discharge: 2010-03-28 | Payer: Self-pay | Admitting: Internal Medicine

## 2010-03-28 ENCOUNTER — Encounter (INDEPENDENT_AMBULATORY_CARE_PROVIDER_SITE_OTHER): Payer: Self-pay | Admitting: Internal Medicine

## 2010-05-01 ENCOUNTER — Encounter: Payer: Self-pay | Admitting: Internal Medicine

## 2010-05-01 ENCOUNTER — Ambulatory Visit: Payer: Self-pay | Admitting: Internal Medicine

## 2010-05-01 ENCOUNTER — Ambulatory Visit (HOSPITAL_COMMUNITY): Admission: RE | Admit: 2010-05-01 | Discharge: 2010-05-01 | Payer: Self-pay | Admitting: Internal Medicine

## 2010-05-01 DIAGNOSIS — G4700 Insomnia, unspecified: Secondary | ICD-10-CM

## 2010-05-01 DIAGNOSIS — M25569 Pain in unspecified knee: Secondary | ICD-10-CM | POA: Insufficient documentation

## 2010-05-01 HISTORY — DX: Insomnia, unspecified: G47.00

## 2010-05-08 ENCOUNTER — Ambulatory Visit (HOSPITAL_COMMUNITY): Admission: RE | Admit: 2010-05-08 | Discharge: 2010-05-08 | Payer: Self-pay | Admitting: Internal Medicine

## 2010-05-08 LAB — HM MAMMOGRAPHY: HM Mammogram: NEGATIVE

## 2010-05-15 ENCOUNTER — Ambulatory Visit: Payer: Self-pay | Admitting: Internal Medicine

## 2010-05-15 DIAGNOSIS — H5710 Ocular pain, unspecified eye: Secondary | ICD-10-CM

## 2010-05-20 ENCOUNTER — Encounter: Admission: RE | Admit: 2010-05-20 | Discharge: 2010-06-04 | Payer: Self-pay | Admitting: Internal Medicine

## 2010-05-21 ENCOUNTER — Encounter: Payer: Self-pay | Admitting: Internal Medicine

## 2010-05-30 ENCOUNTER — Encounter: Payer: Self-pay | Admitting: Internal Medicine

## 2010-05-30 ENCOUNTER — Ambulatory Visit (HOSPITAL_COMMUNITY): Admission: RE | Admit: 2010-05-30 | Discharge: 2010-05-30 | Payer: Self-pay | Admitting: Internal Medicine

## 2010-05-30 ENCOUNTER — Ambulatory Visit: Payer: Self-pay | Admitting: Cardiology

## 2010-05-30 ENCOUNTER — Ambulatory Visit: Payer: Self-pay

## 2010-06-02 ENCOUNTER — Telehealth: Payer: Self-pay | Admitting: *Deleted

## 2010-06-15 ENCOUNTER — Emergency Department (HOSPITAL_COMMUNITY): Admission: EM | Admit: 2010-06-15 | Discharge: 2010-06-15 | Payer: Self-pay | Admitting: Emergency Medicine

## 2010-06-30 ENCOUNTER — Telehealth: Payer: Self-pay | Admitting: Cardiology

## 2010-07-09 ENCOUNTER — Encounter (INDEPENDENT_AMBULATORY_CARE_PROVIDER_SITE_OTHER): Payer: Self-pay | Admitting: *Deleted

## 2010-10-02 ENCOUNTER — Emergency Department (HOSPITAL_COMMUNITY)
Admission: EM | Admit: 2010-10-02 | Discharge: 2010-10-02 | Payer: Self-pay | Source: Home / Self Care | Admitting: Emergency Medicine

## 2010-11-11 NOTE — Assessment & Plan Note (Signed)
Summary: RA/NEEDS 1 MONTH F/U VISIT/CH   Vital Signs:  Patient profile:   50 year old female Height:      61 inches (154.94 cm) Weight:      253.4 pounds (115.18 kg) BMI:     48.05 Temp:     97.5 degrees F (36.39 degrees C) oral Pulse rate:   71 / minute BP sitting:   136 / 87  (right arm)  Vitals Entered By: Stanton Kidney Ditzler RN (May 01, 2010 1:55 PM) Is Patient Diabetic? No Pain Assessment Patient in pain? yes     Location: right  leg and chest Intensity: 6-8 Type: sharp Onset of pain  since last visit Nutritional Status BMI of > 30 = obese Nutritional Status Detail appetite down  Have you ever been in a relationship where you felt threatened, hurt or afraid?denies   Does patient need assistance? Functional Status Self care Ambulation Normal Comments Husband with pt. FU - right leg and chest worse since last visit.   Primary Care Provider:  Elby Showers MD   History of Present Illness: Patient's CC is pain on lateral side of right leg worse with movement, with and without weight bearing. Described as pulling pain on the side of the knee.  Naproxen doesn't help at all.  Patient states feels like it is going to give out when she tries to step up on a stool or ladder.   Patient's second complaint is new chest pain.  She states the chest pain she had at her last visit has resolved and this new pain has started.  She describes it as a pressure-like pain in the center of her chest that occurs with exertion and at rest.  She can start sweating with the pain.  It lasts approximately 2-15 minutes and is relieved by sitting and resting or by taking an aspirin.  It has occured 2-3 times a day for the past few weeks.  She does get short of breath with the pain.  Also headache, "sqeezing" that lasts for 18min-12h.  Relieved with ibuprofen and sleep.  Some nausea with the pain but not always.    Endorses having  8-9 loose stools yesterday that were brown and watery and unrelated to the  pain.  No fevers.  endorses poor appetite for past several weeks but no weight loss. no fevers, chills, reflux symptoms, vomiting, abdominal pain, no sick contacts.  no urinary symptoms.  Last week was her mother' birthday and this made her upset, but did not experience any pain that day, just sadness.  No increased use of xanax. She has had trouble sleeping since taking care of her mother for the last 2 years of her life (she died approx 4 months ago of metastatic lung cancer)  Depression History:      The patient denies a depressed mood most of the day and a diminished interest in her usual daily activities.         Preventive Screening-Counseling & Management  Alcohol-Tobacco     Alcohol drinks/day: 0     Smoking Status: never  Caffeine-Diet-Exercise     Does Patient Exercise: yes  Current Problems (verified): 1)  Allergic Rhinitis, Seasonal  (ICD-477.0) 2)  Myalgia  (ICD-729.1) 3)  Elevated Blood Pressure Without Diagnosis of Hypertension  (ICD-796.2) 4)  Unspecified Hypothyroidism  (ICD-244.9) 5)  Anemia, Iron Deficiency  (ICD-280.9) 6)  Health Screening  (ICD-V70.0) 7)  Chest Pain, Atypical  (ICD-786.59) 8)  Screening For Malignant Neoplasm, Cervix  (ICD-V76.2) 9)  Knee Pain, Left  (ICD-719.46) 10)  Obesity  (ICD-278.00) 11)  Panic Disorder  (ICD-300.01) 12)  Hypertriglyceridemia  (ICD-272.1) 13)  Cholecystectomy, Hx of  (ICD-V45.79)  Current Medications (verified): 1)  Paxil 40 Mg Tabs (Paroxetine Hcl) .... Take One Tablet Daily. 2)  Alprazolam 0.5 Mg Tabs (Alprazolam) .... Take One Tab By Mouth Once Every 8 Hours As Needed For Anxiety 3)  [facility] Iron 325 (65 Fe) Mg  Tabs (Ferrous Sulfate) .... Take 1 Tablet By Mouth Two Times A Day 4)  Levothyroxine Sodium 125 Mcg Tabs (Levothyroxine Sodium) .... Take 1 Tablet By Mouth Once A Day (On Empty Stomach and 45 Minutes Apart From Iron Pills). 5)  Protonix 40 Mg Tbec (Pantoprazole Sodium) .... Take 1 Tablet By Mouth Once A Day 30  Minutes Before Breakfast. 6)  Pravastatin Sodium 40 Mg Tabs (Pravastatin Sodium) .... Take One Tab Once Daily 7)  Pain Relief 500 Mg Tabs (Acetaminophen) .... Take 1-2 Tabs Every 4-6 Hours As Needed For Relief of Pain 8)  Nitrostat 0.4 Mg Subl (Nitroglycerin) .... Take 1 Tablet Under Your Tongue As Needed For Chest Pain.  If Severe, Repeat in 10 Minutes, and If Persists, Go To [facility].  Allergies: 1)  ! Doxycycline 2)  ! Septra  Family History: Mother:  MI; died of metastatic lung cancer (+smoker); brain anurysms; HTN; hypercholesterolemia Significant cancer history maternal side  Father:  HTN, died of plumonary-esophageal fistula  Social History: Currently unemployed. 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 Married. Has 2 children, one lives with her (youngest son). Denies smoking, EtOH, drugs.Smoking Status:  never  Review of Systems       see HPI  Physical Exam  General:  NAD obese female Head:  no abnormalities observed.   Eyes:  pupils equal, pupils round, and pupils reactive to light.   Ears:  R ear normal, L ear normal, and no external deformities.   Nose:  no external erythema and no nasal discharge.   Mouth:  upper dentures; pharynx pink and moist, no erythema, no exudates, and no tongue abnormalities.   Neck:  supple, full ROM, and no thyromegaly.   Chest Wall:  no deformities; slight tenderness to palpation anterior chest wall over mid-sternum  Lungs:  normal respiratory effort, normal breath sounds, no crackles, and no wheezes.   Heart:  normal rate, regular rhythm, and no murmur.   Abdomen:  obese. soft, non-tender, and normal bowel sounds.   Msk:  significant for pain on palpation lateral left knee joint, pain produced with weight bearing, also with hamstring flexion.  No joint warmth or effusion.  Other joints are nontender.  some tenderness extending superiorly at base of left lateral thigh.   Impression & Recommendations:  Problem # 1:  CHEST  PAIN, ATYPICAL (ICD-786.59) Unlikely to be heart disease - her 10 year risk of coronary event is less than 1%.  EKG in the clinic showed normal sinus rhythm.  However, will give patient nitroglycerine for pain (to evaluate, if she has it again, if it responds and therefore might justify referral for myoview  from cardiology).  Pt is to try the NTG x1 and then once more if she gets the CP and was instructed that if much worse and not relieved by NTG to go to the ED immediately.  She is to follow-up in 2 weeks with [place] to reassess.   Problem # 2:  INSOMNIA UNSPECIFIED (ICD-780.52) This is long standing.  The patinet was instructed to try taking a  xanax before bed to help her sleep.  We will reevaluate at her next visit.    Problem # 3:  OTHER SCREENING MAMMOGRAM (ICD-V76.12)  Orders: Mammogram (Mammogram)  Problem # 4:  KNEE PAIN, RIGHT (ICD-719.46) The patient is weaker on the right lower extremity as compared to the left.  NSAIDS have not helped.  We will try physical therapy to see if strengthening may helpher pain by improving her joint stability.     The following medications were removed from the medication list:    Naproxen 375 Mg Tabs (Naproxen) .Marland Kitchen... Take 1 tablet by mouth two times a day Her updated medication list for this problem includes:    Pain Relief 500 Mg Tabs (Acetaminophen) .Marland Kitchen... Take 1-2 tabs every 4-6 hours as needed for relief of pain  Orders: Physical Therapy Referral (PT)  Problem # 5:  OBESITY (ICD-278.00) The patient has not lost any weight since her last visit.  She states that she is still trying to work on improving her diet.  Will continue to encourage and counsel her to try to exercise at least 5 times a week and avoid high fat foods.  Problem # 6:  PANIC DISORDER (ICD-300.01) The patient reports that she is doing well on Paxil and using xanax less than once a week.  Stable.    Her updated medication list for this problem includes:    Paxil 40 Mg Tabs  (Paroxetine hcl) .Marland Kitchen... Take one tablet daily.    Alprazolam 0.5 Mg Tabs (Alprazolam) .Marland Kitchen... Take one tab by mouth once every 8 hours as needed for anxiety  Problem # 7:  UNSPECIFIED HYPOTHYROIDISM (ICD-244.9) The patient has been taking her synthroid with her iron pills.  She was instructed to wait at least 2 hours between taking these two medications as to not affect absorption of her synthroid.     Her updated medication list for this problem includes:    Levothyroxine Sodium 125 Mcg Tabs (Levothyroxine sodium) .Marland Kitchen... Take 1 tablet by mouth once a day (on empty stomach and 45 minutes apart from iron pills).  Complete Medication List: 1)  Paxil 40 Mg Tabs (Paroxetine hcl) .... Take one tablet daily. 2)  Alprazolam 0.5 Mg Tabs (Alprazolam) .... Take one tab by mouth once every 8 hours as needed for anxiety 3)  Cvs Iron 325 (65 Fe) Mg Tabs (Ferrous sulfate) .... Take 1 tablet by mouth two times a day 4)  Levothyroxine Sodium 125 Mcg Tabs (Levothyroxine sodium) .... Take 1 tablet by mouth once a day (on empty stomach and 45 minutes apart from iron pills). 5)  Protonix 40 Mg Tbec (Pantoprazole sodium) .... Take 1 tablet by mouth once a day 30 minutes before breakfast. 6)  Pravastatin Sodium 40 Mg Tabs (Pravastatin sodium) .... Take one tab once daily 7)  Pain Relief 500 Mg Tabs (Acetaminophen) .... Take 1-2 tabs every 4-6 hours as needed for relief of pain 8)  Nitrostat 0.4 Mg Subl (Nitroglycerin) .... Take 1 tablet under your tongue as needed for chest pain.  if severe, repeat in 10 minutes, and if persists, go to nearest hospital.   Patient Instructions: 1)  Please schedule a follow-up appointment in 2 weeks. 2)  Today you were evaluated for: 3)  1) Chest pain: Given your age and medical history it is unlikely that your chest pain is related to your heart.  However, if your pain recurs, please try taking one nitroglycerine tablet under your tongue.  Note if the pain improves.  If the pain is  severe and does not improve, try taking another tablet.  If the pain persists, please go to the nearest Emergency Department.  A prescription for this drug has been sent to your pharmacy.   4)  2) leg pain:  You will be given a referral for physical therapy; this should help to further evaluate your knee and hopefully improve your strength. 5)  3)  hypothyroidism:  You were incorrectly taking your synthroid, and I believe this was leading to inadequate treatment of your hypothyroidism.  Please take your iron pills at least one hour after you take your synthroid. 6)  4) difficulty sleeping:  Try taking one xanax prior to going to bed to see if this helps you get better sleep during the next two weeks.     7)  5) health prevention:  Today we will schedule your mammography.   8)  6) obesity:  It is important that you exercise regularly at least 20 minutes 5 times a week. If you develop chest pain, have severe difficulty breathing, or feel very tired , stop exercising immediately and seek medical attention.  You need to lose weight. Consider a lower calorie diet and regular exercise.  9)  We will re-evaluate all of these problems at your next visit in 2 weeks. Prescriptions: NITROSTAT 0.4 MG SUBL (NITROGLYCERIN) Take 1 tablet under your tongue as needed for chest pain.  If severe, repeat in 10 minutes, and if persists, go to nearest hospital.  #30 x 1   Entered and Authorized by:   Danelle Berry, MD   Signed by:   Danelle Berry, MD on 05/01/2010   Method used:   Electronically to        [facility] (retail)       [address]       [place], [place]  [address]       Ph: [phone]       Fax: [phone]   RxID:   [id number]    Prevention & Chronic Care Immunizations   Influenza vaccine: Not documented   Influenza vaccine deferral: Deferred  (06/14/2009)    Tetanus booster: 01/18/2009: Tdap   Tetanus booster due: 01/19/2019     Pneumococcal vaccine: Not documented  Other Screening   Pap smear: Not documented   Pap smear action/deferral: Not indicated S/P hysterectomy  (06/14/2009)    Mammogram: Not documented   Mammogram action/deferral: Ordered  (03/28/2010)   Smoking status: never  (05/01/2010)  Lipids   Total Cholesterol: 181  (12/06/2009)   LDL: 111  (12/06/2009)   LDL Direct: Not documented   HDL: 37  (12/06/2009)   Triglycerides: 164  (12/06/2009)    SGOT (AST): 15  (12/06/2009)   SGPT (ALT): 18  (12/06/2009)   Alkaline phosphatase: 99  (12/06/2009)   Total bilirubin: 0.6  (12/06/2009)  Self-Management Support :   Personal Goals (by the next clinic visit) :      Personal LDL goal: 160  (06/14/2009)    Patient will work on the following items until the next clinic visit to reach self-care goals:     Medications and monitoring: take my medicines every day, bring all of my medications to every visit, weigh myself weekly  (05/01/2010)     Eating: drink diet soda or water instead of juice or soda, eat more vegetables, use fresh or frozen vegetables, eat foods that are low in salt,  eat baked foods instead of fried foods, eat fruit for snacks and desserts, limit or avoid alcohol  (05/01/2010)     Activity: take a 30 minute walk every day, take the stairs instead of the elevator, park at the far end of the parking lot  (05/01/2010)    Lipid self-management support: Written self-care plan, Education handout, Resources for patients handout  (05/01/2010)   Lipid self-care plan printed.   Lipid education handout printed      Resource handout printed.

## 2010-11-11 NOTE — Assessment & Plan Note (Signed)
Summary: est-2 week recheck/ch   Vital Signs:  Patient profile:   50 year old female Height:      61 inches (154.94 cm) Weight:      250.4 pounds (113.82 kg) BMI:     47.48 Temp:     97.9 degrees F (36.61 degrees C) oral Pulse rate:   78 / minute BP sitting:   143 / 92  (right arm)  Vitals Entered By: Stanton Kidney Ditzler RN (May 15, 2010 1:39 PM) Is Patient Diabetic? No Pain Assessment Patient in pain? yes     Location: left eye Intensity: 5.5 Type: sharp Onset of pain  today Nutritional Status BMI of > 30 = obese Nutritional Status Detail appetite good  Have you ever been in a relationship where you felt threatened, hurt or afraid?denies   Does patient need assistance? Functional Status Self care Ambulation Normal Comments Discuss results mammogram and ck left eye.   Primary Care Provider:  Elby Showers MD   History of Present Illness: Patient comes today with left eye pain.  Started this morning, woke up with it with watery red, painful eye.  Feels like stabbing pain, constant.  Red and watering, felt like something was in her eye.  sore under the eye as well.  No vision changes, no headache.  Very bright lights bother her.   Patient continues to have episodes of chest pain.  Since she was last seen in clinic she has had 2 episodes, once while sweeping and then once when sitting at computer working.  She describes the pain as a pressure/sqeezing in the middle of her chest that radiates to her back and jaw.  She waited for 5 minutes and when it persisted, she took nitroglycerine and it was relieved within a few seconds.  Patient has trouble sleeping; she has had improvement with taking her xanax at night before bed.  She has not needed the xanax during the day.   Continues to have knee pain (right), not worse.  PT starts next week.  Patient has been walking to try to lose weight and improved diet (baking, grilling, broiling).  And cut down on soda consumption to 1 time a  week.  no fevers, chills, abd pain, nausea, vomiting, changes in bowels or bladder function.  intentional 3lb weight loss  Depression History:      The patient denies a depressed mood most of the day and a diminished interest in her usual daily activities.         Preventive Screening-Counseling & Management  Alcohol-Tobacco     Alcohol drinks/day: 0     Smoking Status: never     Year Quit: 30 yrs ago  Caffeine-Diet-Exercise     Does Patient Exercise: yes     Type of exercise: WALKING     Times/week: 2-3  Current Problems (verified): 1)  Eye Pain, Left  (ICD-379.91) 2)  Other Screening Mammogram  (ICD-V76.12) 3)  Insomnia Unspecified  (ICD-780.52) 4)  Allergic Rhinitis, Seasonal  (ICD-477.0) 5)  Myalgia  (ICD-729.1) 6)  Elevated Blood Pressure Without Diagnosis of Hypertension  (ICD-796.2) 7)  Unspecified Hypothyroidism  (ICD-244.9) 8)  Anemia, Iron Deficiency  (ICD-280.9) 9)  Health Screening  (ICD-V70.0) 10)  Chest Pain, Atypical  (ICD-786.59) 11)  Screening For Malignant Neoplasm, Cervix  (ICD-V76.2) 12)  Knee Pain, Right  (ICD-719.46) 13)  Obesity  (ICD-278.00) 14)  Panic Disorder  (ICD-300.01) 15)  Hypertriglyceridemia  (ICD-272.1) 16)  Cholecystectomy, Hx of  (ICD-V45.79)  Current Medications (verified):  1)  Paxil 40 Mg Tabs (Paroxetine Hcl) .... Take One Tablet Daily. 2)  Alprazolam 0.5 Mg Tabs (Alprazolam) .... Take One Tablet By Mouth Every Evening As Needed For Sleep 3)  Cvs Iron 325 (65 Fe) Mg  Tabs (Ferrous Sulfate) .... Take 1 Tablet By Mouth Two Times A Day 4)  Levothyroxine Sodium 125 Mcg Tabs (Levothyroxine Sodium) .... Take 1 Tablet By Mouth Once A Day (On Empty Stomach and 45 Minutes Apart From Iron Pills). 5)  Protonix 40 Mg Tbec (Pantoprazole Sodium) .... Take 1 Tablet By Mouth Once A Day 30 Minutes Before Breakfast. 6)  Pravastatin Sodium 40 Mg Tabs (Pravastatin Sodium) .... Take One Tab Once Daily 7)  Pain Relief 500 Mg Tabs (Acetaminophen) .... Take  1-2 Tabs Every 4-6 Hours As Needed For Relief of Pain 8)  Nitrostat 0.4 Mg Subl (Nitroglycerin) .... Take 1 Tablet Under Your Tongue As Needed For Chest Pain.  If Severe, Repeat in 10 Minutes, and If Persists, Go To [facility].  Allergies: 1)  ! Doxycycline 2)  ! Septra  Past History:  Past Medical History: Reviewed history from 02/20/2009 and no changes required. 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  Past Surgical History: Reviewed history from 02/20/2009 and no changes required. s/p chole s/p hysterectomy  Family History: Reviewed history from 05/01/2010 and no changes required. Mother:  MI; died of metastatic lung cancer (+smoker); brain anurysms; HTN; hypercholesterolemia Significant cancer history maternal side  Father:  HTN, died of plumonary-esophageal fistula  Social History: Reviewed history from 05/01/2010 and no changes required. Currently unemployed. 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 Married. Has 2 children, one lives with her (youngest son). Denies smoking, EtOH, drugs.  Review of Systems       see HPI   Impression & Recommendations:  Problem # 1:  CHEST PAIN, ATYPICAL (ICD-786.59) Though patient is at low risk for CAD given her age and risk factors, she gives a good story for anginal pain (sqeezing, pressure-like, radiates to jaw, occurs usually with exertion, lasts 15-20 minutes and is relieved by nitro).  She also has a family history of CAD. At her last visit, she was given nitroglycerine to take with the CP and it relieved her pain.  We will refer her to cardiology for a stress ECHO - the patient is in the process of obtaining her orange card, and once she has this she can go ahead with the  referral.  We will see her back in 1 month.  Orders: Cardiology Referral (Cardiology)  Problem # 2:  EYE PAIN, LEFT (ICD-379.91) Possibly 2/2 foreign body that is now not present, viral conjunctivitis (though no known exposures), or early sty. Unlikely acute angle closure glaucoma, iritis, bacterial conjunctivitis, or acute corneal process.   Reassured the patinet and recommended warm compresses and artificial tears.  Instructed her to return tot he clinic or her local ED if the pain worsens, she becomes more photophobic or has such a painful eye that she cannot keep it open, starts to feel nauseous with the pain, or her vision changes.  Problem # 3:  INSOMNIA UNSPECIFIED (ICD-780.52) Improved with nightly xanax. Will give patient prescription for xanax 0.5mg  by mouth at bedtime as needed sleep #90 today.  Problem #  4:  ELEVATED BLOOD PRESSURE WITHOUT DIAGNOSIS OF HYPERTENSION (ICD-796.2) Wil continue to monitor.  Patient said she has had a stressful day and she thinks this might have contributed to her higher than normal BP.  AT last visit the patient was normal but borderline to high, so will need to closely monitor to see if this is going to be a new problem for her.    Problem # 5:  KNEE PAIN, RIGHT (ICD-719.46) Unchanged, stable - patient is to start PT next week, as her pain likely related to IT band and could respond well to strengthening.  Her updated medication list for this problem includes:    Pain Relief 500 Mg Tabs (Acetaminophen) .Marland Kitchen... Take 1-2 tabs every 4-6 hours as needed for relief of pain  Problem # 6:  OBESITY (ICD-278.00) Patient has lost 3 pounds; is actively working with her diet and has started exercising.  Encouraged the patient to lose 1 pound a week, and she agrees with this goal.  Problem # 7:  UNSPECIFIED HYPOTHYROIDISM (ICD-244.9) Patient has been taking her thyroid meds apart from her iron for the last 2 weeks, consider retesting TSH at next visit to see if  taking her synthroid and iron together could have beeen contributing to her suboptimal response to her synthroid.  Her updated medication list for this problem includes:    Levothyroxine Sodium 125 Mcg Tabs (Levothyroxine sodium) .Marland Kitchen... Take 1 tablet by mouth once a day (on empty stomach and 45 minutes apart from iron pills).  Problem # 8:  ANEMIA, IRON DEFICIENCY (ICD-280.9) Continue oral supplementaion.  Her updated medication list for this problem includes:    Cvs Iron 325 (65 Fe) Mg Tabs (Ferrous sulfate) .Marland Kitchen... Take 1 tablet by mouth two times a day  Complete Medication List: 1)  Paxil 40 Mg Tabs (Paroxetine hcl) .... Take one tablet daily. 2)  Alprazolam 0.5 Mg Tabs (Alprazolam) .... Take one tablet by mouth every evening as needed for sleep 3)  Cvs Iron 325 (65 Fe) Mg Tabs (Ferrous sulfate) .... Take 1 tablet by mouth two times a day 4)  Levothyroxine Sodium 125 Mcg Tabs (Levothyroxine sodium) .... Take 1 tablet by mouth once a day (on empty stomach and 45 minutes apart from iron pills). 5)  Protonix 40 Mg Tbec (Pantoprazole sodium) .... Take 1 tablet by mouth once a day 30 minutes before breakfast. 6)  Pravastatin Sodium 40 Mg Tabs (Pravastatin sodium) .... Take one tab once daily 7)  Pain Relief 500 Mg Tabs (Acetaminophen) .... Take 1-2 tabs every 4-6 hours as needed for relief of pain 8)  Nitrostat 0.4 Mg Subl (Nitroglycerin) .... Take 1 tablet under your tongue as needed for chest pain.  if severe, repeat in 10 minutes, and if persists, go to nearest hospital.  Patient Instructions: 1)  For your eye pain, you can try warm compresses and artificial tears (NOT visine).  If your pain becomes worse, if you cannot keep your eye open because of pain/discomfort, please call us at the clinic or go to your emergency room if it is after-hours.   2)  We will make a referral to the cardiologist once you have your orange card - please bring the required paperwork to the office as soon as you can  to expedite this. 3)  Please continue to take your medicines as prescribed.  Don't forget to take your thyroid medicine and iron pills at least 2 hours apart. 4)  Keep up the good work with your  weight loss; your goal is to lose approximately 1 pound a week through sensible eating and regular exercise. 5)  We will see you back for follow-up in approximately one month. Prescriptions: ALPRAZOLAM 0.5 MG TABS (ALPRAZOLAM) take one tablet by mouth every evening as needed for sleep  #90 x 0   Entered and Authorized by:   Danelle Berry, MD   Signed by:   Danelle Berry, MD on 05/15/2010   Method used:   Print then Give to Patient   RxID:   [id number]    Prevention & Chronic Care Immunizations   Influenza vaccine: Not documented   Influenza vaccine deferral: Deferred  (06/14/2009)    Tetanus booster: 01/18/2009: Tdap   Tetanus booster due: 01/19/2019    Pneumococcal vaccine: Not documented  Other Screening   Pap smear: Not documented   Pap smear action/deferral: Not indicated S/P hysterectomy  (06/14/2009)    Mammogram: ASSESSMENT: Negative - BI-RADS 1^MS DIGITAL SCREENING  (05/08/2010)   Mammogram action/deferral: Ordered  (03/28/2010)   Smoking status: never  (05/15/2010)  Lipids   Total Cholesterol: 181  (12/06/2009)   LDL: 111  (12/06/2009)   LDL Direct: Not documented   HDL: 37  (12/06/2009)   Triglycerides: 164  (12/06/2009)    SGOT (AST): 15  (12/06/2009)   SGPT (ALT): 18  (12/06/2009)   Alkaline phosphatase: 99  (12/06/2009)   Total bilirubin: 0.6  (12/06/2009)  Self-Management Support :   Personal Goals (by the next clinic visit) :      Personal LDL goal: 160  (06/14/2009)    Patient will work on the following items until the next clinic visit to reach self-care goals:     Medications and monitoring: take my medicines every day, bring all of my medications to every visit, weigh myself weekly  (05/15/2010)     Eating: drink diet soda or water instead of  juice or soda, eat more vegetables, use fresh or frozen vegetables, eat foods that are low in salt, eat baked foods instead of fried foods, eat fruit for snacks and desserts  (05/15/2010)     Activity: take a 30 minute walk every day, take the stairs instead of the elevator, park at the far end of the parking lot  (05/15/2010)    Lipid self-management support: Written self-care plan, Education handout, Resources for patients handout  (05/15/2010)   Lipid self-care plan printed.   Lipid education handout printed      Resource handout printed.  Primary Care Provider:  Elby Showers MD   History of Present Illness: Patient comes today with left eye pain.  Started this morning, woke up with it with watery red, painful eye.  Feels like stabbing pain, constant.  Red and watering, felt like something was in her eye.  sore under the eye as well.  No vision changes, no headache.  Very bright lights bother her.   Patient continues to have episodes of chest pain.  Since she was last seen in clinic she has had 2 episodes, once while sweeping and then once when sitting at computer working.  She describes the pain as a pressure/sqeezing in the middle of her chest that radiates to her back and jaw.  She waited for 5 minutes and when it persisted, she took nitroglycerine and it was relieved within a few seconds.  Patient has trouble sleeping; she has had improvement with taking her xanax at night before bed.  She has not needed the xanax during the day.   Continues  to have knee pain (right), not worse.  PT starts next week.  Patient has been walking to try to lose weight and improved diet (baking, grilling, broiling).  And cut down on soda consumption to 1 time a week.  no fevers, chills, abd pain, nausea, vomiting, changes in bowels or bladder function.  intentional 3lb weight loss  Depression History:      The patient denies a depressed mood most of the day and a diminished interest in her usual daily  activities.         Appended Document: est-2 week recheck/ch I saw Ms Mcadams with Dr Claudette Laws. I agree with her hx, PE, and A/P as outlined above. Dr Rogelia Boga

## 2010-11-11 NOTE — Letter (Signed)
Summary: Appointment - Missed  [facility], Main Office  [address]   [place], [place] [address]   Phone: [phone]  Fax: [phone]     July 09, 2010 MRN: [id number]   [name] [address] [place], [place]  [address]   Dear Ms. Hanger,  Our records indicate you missed your appointment on   06-30-2010    with  Dr. Antoine Poche .  It is very important that we reach you to reschedule this appointment. We look forward to participating in your health care needs. Please contact us at the number listed above at your earliest convenience to reschedule this appointment.     Sincerely,      Lorne Skeens  [facility] Scheduling Team

## 2010-11-11 NOTE — Assessment & Plan Note (Signed)
Summary: ACUTE-WANTS STRONGER MEDS FOR NERVES/CFB([facility])   Vital Signs:  Patient profile:   50 year old female Height:      61 inches (154.94 cm) Weight:      250.5 pounds (113.86 kg) BMI:     47.50 Temp:     97.4 degrees F (36.33 degrees C) oral Pulse rate:   78 / minute BP sitting:   134 / 92  (right arm)  Vitals Entered By: Stanton Kidney Ditzler RN (December 06, 2009 9:59 AM) Is Patient Diabetic? No Pain Assessment Patient in pain? yes     Location: sius h/a Intensity: 4 Type: pressure Onset of pain  this AM Nutritional Status BMI of > 30 = obese Nutritional Status Detail appetite good  Have you ever been in a relationship where you felt threatened, hurt or afraid?denies   Does patient need assistance? Functional Status Self care Ambulation Normal Comments Refill Xanax - mother at [facility] dx lung ca to brain - doing radiation.   Primary Care Provider:  Elby Showers MD   History of Present Illness: 50y/o obese woman with pmh as outlined in the EMR; who comes to the clinic for regular followupand to get refill on her medications. Patient reports having increased nerves breakdown and difficulty copping after her mother was diagnosed with lung cancer and metastasis to her brain.  Patient is also complaining of non productive cough, runny nose, nasal congestion and HA's.  Patient denies CP, SOB, fever, chills, abdominal pain, constipation, diarrhea, reflux or indigestion.  Patient is compliant with her medications, except for cholesterol and synthroid (prescription was not given for generic and she is unable to pay that much for thyroid medication).   Depression History:      The patient denies a depressed mood most of the day and a diminished interest in her usual daily activities.        The patient denies that she feels like life is not worth living, denies that she wishes that she were dead, and denies that she has thought about ending her life.         Preventive  Screening-Counseling & Management  Alcohol-Tobacco     Alcohol drinks/day: 0     Smoking Status: quit     Year Quit: 30 yrs ago  Caffeine-Diet-Exercise     Does Patient Exercise: yes     Type of exercise: WALKING     Times/week: 2-3  Problems Prior to Update: 1)  Allergic Rhinitis, Seasonal  (ICD-477.0) 2)  Myalgia  (ICD-729.1) 3)  Nausea With Vomiting  (ICD-787.01) 4)  Rlq Pain  (ICD-789.03) 5)  Laceration, Right Thumb  (ICD-883.0) 6)  Elevated Blood Pressure Without Diagnosis of Hypertension  (ICD-796.2) 7)  Pruritus  (ICD-698.9) 8)  Elbow Pain, Left  (ICD-719.42) 9)  Fibroids, Uterus  (ICD-218.9) 10)  Unspecified Hypothyroidism  (ICD-244.9) 11)  Anemia, Iron Deficiency  (ICD-280.9) 12)  Health Screening  (ICD-V70.0) 13)  Hemorrhoids  (ICD-455.6) 14)  Chest Pain, Atypical  (ICD-786.59) 15)  Screening For Malignant Neoplasm, Cervix  (ICD-V76.2) 16)  Knee Pain, Left  (ICD-719.46) 17)  Obesity  (ICD-278.00) 18)  Panic Disorder  (ICD-300.01) 19)  Hypertriglyceridemia  (ICD-272.1) 20)  Cholecystectomy, Hx of  (ICD-V45.79)  Current Problems (verified): 1)  Myalgia  (ICD-729.1) 2)  Nausea With Vomiting  (ICD-787.01) 3)  Rlq Pain  (ICD-789.03) 4)  Laceration, Right Thumb  (ICD-883.0) 5)  Elevated Blood Pressure Without Diagnosis of Hypertension  (ICD-796.2) 6)  Pruritus  (ICD-698.9) 7)  Elbow Pain, Left  (  ZOX-096.04) 8)  Fibroids, Uterus  (ICD-218.9) 9)  Unspecified Hypothyroidism  (ICD-244.9) 10)  Anemia, Iron Deficiency  (ICD-280.9) 11)  Health Screening  (ICD-V70.0) 12)  Hemorrhoids  (ICD-455.6) 13)  Chest Pain, Atypical  (ICD-786.59) 14)  Screening For Malignant Neoplasm, Cervix  (ICD-V76.2) 15)  Knee Pain, Left  (ICD-719.46) 16)  Obesity  (ICD-278.00) 17)  Panic Disorder  (ICD-300.01) 18)  Hypertriglyceridemia  (ICD-272.1) 19)  Cholecystectomy, Hx of  (ICD-V45.79)  Medications Prior to Update: 1)  Paxil 40 Mg Tabs (Paroxetine Hcl) .... Take One Tablet Daily. 2)   Alprazolam 0.5 Mg Tabs (Alprazolam) .... Take One Tab By Mouth Once Every 8 Hours As Needed For Anxiety 3)  Cvs Iron 325 (65 Fe) Mg  Tabs (Ferrous Sulfate) .... Take 1 Tablet By Mouth Three Times A Day 4)  Synthroid 125 Mcg  Tabs (Levothyroxine Sodium) .... Take 1 Tablet By Mouth Once A Day 5)  Allergy 25 Mg Tabs (Diphenhydramine Hcl) .... Take 1 Tablet By Mouth At Night As Needed For Itching 6)  Lidex 0.05 % Crea (Fluocinonide) .... Please Apply To Itchy Areas Twice A Day 7)  Zyrtec Allergy 10 Mg Tabs (Cetirizine Hcl) .... Take One Tab By Mouth Twice Daily, Morning and Afternoon, As Needed For Itching 8)  Promethazine Hcl 25 Mg Tabs (Promethazine Hcl) .... Take One Tab By Mouth Every 4-6 Hours As Needed For Nausea. 9)  Protonix 40 Mg Tbec (Pantoprazole Sodium) .... Take 1 Tablet By Mouth Once A Day 30 Minutes Before Breakfast. 10)  Pravastatin Sodium 40 Mg Tabs (Pravastatin Sodium) .... Take One Tab Once Daily 11)  Amitriptyline Hcl 25 Mg Tabs (Amitriptyline Hcl) .... Take 1 Tablet By Mouth Once A Day  Current Medications (verified): 1)  Paxil 40 Mg Tabs (Paroxetine Hcl) .... Take One Tablet Daily. 2)  Alprazolam 0.5 Mg Tabs (Alprazolam) .... Take One Tab By Mouth Once Every 8 Hours As Needed For Anxiety 3)  Cvs Iron 325 (65 Fe) Mg  Tabs (Ferrous Sulfate) .... Take 1 Tablet By Mouth Three Times A Day 4)  Synthroid 125 Mcg  Tabs (Levothyroxine Sodium) .... Take 1 Tablet By Mouth Once A Day 5)  Zyrtec Allergy 10 Mg Tabs (Cetirizine Hcl) .... Take One Tab By Mouth Twice Daily, Morning and Afternoon, As Needed For Itching 6)  Protonix 40 Mg Tbec (Pantoprazole Sodium) .... Take 1 Tablet By Mouth Once A Day 30 Minutes Before Breakfast. 7)  Pravastatin Sodium 40 Mg Tabs (Pravastatin Sodium) .... Take One Tab Once Daily 8)  Amitriptyline Hcl 25 Mg Tabs (Amitriptyline Hcl) .... Take 1 Tablet By Mouth Once A Day  Allergies: 1)  ! Doxycycline 2)  ! Septra  Past History:  Past Medical History: Last  updated: 02/20/2009 Hypertriglyceridemia Panic Disorder Obesity Knee Pain due to DJD Screening for cervical cancer Aytpical Chest pain- neg cardiolite at [facility] 11/07 Iron Def Anemia (required 2 units PRBC 03/2008)      -menometrorrhagia           -uterine fibroids Unspecified hypothyroidism.  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 Married. Denies smoking, EtOH, drugs.  Risk Factors: Smoking Status: quit (12/06/2009)  Review of Systems       The patient complains of prolonged cough and headaches.  The patient denies anorexia, fever, chest pain, syncope, dyspnea on exertion, hemoptysis, abdominal pain, melena, hematochezia, and severe indigestion/heartburn.  Physical Exam  General:  alert, well-developed, well-nourished, well-hydrated, and overweight-appearing.   Nose:  clear nasal discharge appreciated. Patient with paranasal sinuses tenderness Lungs:  normal respiratory effort, normal breath sounds, no crackles, and no wheezes.   Heart:  normal rate, regular rhythm, no murmur, and no JVD.   Abdomen:  soft, non-tender, normal bowel sounds, no distention, and no masses.   Extremities:  No edema. Neurologic:  alert & oriented X3, cranial nerves II-XII intact, strength normal in all extremities, sensation intact to light touch, sensation intact to pinprick, and DTRs symmetrical and normal.     Impression & Recommendations:  Problem # 1:  ALLERGIC RHINITIS, SEASONAL (ICD-477.0) Patient with cough, nasal congestion and HA's.She has been w/o her allergy medications for the last 3-4 months, since she ran out and she was feeling good. Now her symptoms and dry cough are most likely 2/2 to PND and it's an indication to refill and start taking her medications. Will refill her zyrtec daily and will also start her on  nasonex spray.  Problem # 2:  PANIC DISORDER (ICD-300.01) Stable and well controlled with the use of paxil and alprazolam as needed. Will refille her medications today and will follow her symptoms, she under a lot of stress dealing with her mother; she has been advised to call the clinic if anxiety worsen or if she need any further assistance.  Her updated medication list for this problem includes:    Paxil 40 Mg Tabs (Paroxetine hcl) .Marland Kitchen... Take one tablet daily.    Alprazolam 0.5 Mg Tabs (Alprazolam) .Marland Kitchen... Take one tab by mouth once every 8 hours as needed for anxiety    Amitriptyline Hcl 25 Mg Tabs (Amitriptyline hcl) .Marland Kitchen... Take 1 tablet by mouth once a day  Problem # 3:  HYPERTRIGLYCERIDEMIA (ICD-272.1) Patient has not been taking her statins for the last couple of months. She is fasting today, will get a lipid profile and will change or adjust her medications upon results.  Her updated medication list for this problem includes:    Pravastatin Sodium 40 Mg Tabs (Pravastatin sodium) .Marland Kitchen... Take one tab once daily  Orders: T-Lipid Profile ([id number])  Problem # 4:  UNSPECIFIED HYPOTHYROIDISM (ICD-244.9) Will check TSH and will continue levothyroxin medication daily; will adjust dose if needed. Patient instructed to take medication on empty stomach and to wait at least 45 minutes before taking her iron pills.  Her updated medication list for this problem includes:    Levothyroxine Sodium 125 Mcg Tabs (Levothyroxine sodium) .Marland Kitchen... Take 1 tablet by mouth once a day (on empty stomach and 45 minutes apart from iron pills).  Orders: T-TSH [phone]) T-T4, Free [phone]) T-Comprehensive Metabolic Panel [phone])  Problem # 5:  MYALGIA (ICD-729.1) Well controlled and not bothering her at this point. Will continue amitriptylene as directed. Patient compliant with her medications.  Complete Medication List: 1)  Paxil 40 Mg Tabs (Paroxetine hcl) .... Take one tablet daily. 2)   Alprazolam 0.5 Mg Tabs (Alprazolam) .... Take one tab by mouth once every 8 hours as needed for anxiety 3)  Cvs Iron 325 (65 Fe) Mg Tabs (Ferrous sulfate) .... Take 1 tablet by mouth three times a day 4)  Levothyroxine Sodium 125 Mcg Tabs (Levothyroxine sodium) .... Take 1 tablet by mouth once a day (on empty stomach and 45 minutes apart from iron pills). 5)  Zyrtec Allergy 10 Mg Tabs (Cetirizine hcl) .... Take one tab by mouth twice daily, morning and afternoon, as needed for itching 6)  Protonix 40 Mg Tbec (Pantoprazole sodium) .... Take 1 tablet by mouth once a day 30 minutes before breakfast. 7)  Pravastatin Sodium 40 Mg Tabs (Pravastatin sodium) .... Take one tab once daily 8)  Amitriptyline Hcl 25 Mg Tabs (Amitriptyline hcl) .... Take 1 tablet by mouth once a day 9)  Nasonex 50 Mcg/act Susp (Mometasone furoate) .... 2 spray inside each nostril once a day.  Patient Instructions: 1)  Take your medications as prescribed. 2)  Please schedule a follow-up appointment in 4 months. 3)  You will be called with any abnormalities in the tests scheduled or performed today.  If you don't hear from [place] within a week from when the test was performed, you can assume that your test was normal. 4)  Start taking your levothyroxine (medication for hypothyroidism) and also your cholesterol medications. Prescriptions sent to [facility] [address] Prescriptions: ALPRAZOLAM 0.5 MG TABS (ALPRAZOLAM) Take one tab by mouth once every 8 hours as needed for anxiety  #90 x 0   Entered and Authorized by:   Vassie Loll MD   Signed by:   Vassie Loll MD on 12/06/2009   Method used:   Print then Give to Patient   RxID:   [id number] PAXIL 40 MG TABS (PAROXETINE HCL) Take one tablet daily.  #32 x 3   Entered and Authorized by:   Vassie Loll MD   Signed by:   Vassie Loll MD on 12/06/2009   Method used:   Print then Give to Patient   RxID:   [id number] AMITRIPTYLINE HCL 25 MG TABS (AMITRIPTYLINE HCL) Take 1  tablet by mouth once a day  #31 x 2   Entered and Authorized by:   Vassie Loll MD   Signed by:   Vassie Loll MD on 12/06/2009   Method used:   Electronically to        [facility] [address] (retail)       [address]       [place], [place]  [address]       Ph: [phone]       Fax: [phone]   RxID:   [id number] PRAVASTATIN SODIUM 40 MG TABS (PRAVASTATIN SODIUM) Take one tab once daily  #31 x 2   Entered and Authorized by:   Vassie Loll MD   Signed by:   Vassie Loll MD on 12/06/2009   Method used:   Electronically to        [facility] [address] (retail)       [address], [address]       [place], [place]  [address]       Ph: [phone]       Fax: [phone]   RxID:   [id number] ZYRTEC ALLERGY 10 MG TABS (CETIRIZINE HCL) Take one tab by mouth twice daily, morning and afternoon, as needed for itching  #62 x 3   Entered and Authorized by:   Vassie Loll MD   Signed by:   Vassie Loll MD on 12/06/2009   Method used:   Electronically to        [facility] [address] (retail)       [address], [address]       [place], [place]  [address]       Ph: [phone]  Fax: [phone]   RxID:   [id number] LEVOTHYROXINE SODIUM 125 MCG TABS (LEVOTHYROXINE SODIUM) Take 1 tablet by mouth once a day (on empty stomach and 45 minutes apart from iron pills).  #31 x 4   Entered and Authorized by:   Vassie Loll MD   Signed by:   Vassie Loll MD on 12/06/2009   Method used:   Electronically to        [facility] [address] (retail)       [address]       [place], [place]  [address]       Ph: [phone]       Fax: [phone]   RxID:   [id number] NASONEX 50 MCG/ACT SUSP (MOMETASONE FUROATE) 2 spray inside each nostril once a day.  #1 x 1   Entered and Authorized by:   Vassie Loll MD   Signed by:   Vassie Loll MD  on 12/06/2009   Method used:   Electronically to        [facility] [address] (retail)       [address]       [place], [place]  [address]       Ph: [phone]       Fax: [phone]   RxID:   [id number]   Prevention & Chronic Care Immunizations   Influenza vaccine: Not documented   Influenza vaccine deferral: Deferred  (06/14/2009)    Tetanus booster: 01/18/2009: Tdap   Tetanus booster due: 01/19/2019    Pneumococcal vaccine: Not documented  Other Screening   Pap smear: Not documented   Pap smear action/deferral: Not indicated S/P hysterectomy  (06/14/2009)    Mammogram: Not documented   Mammogram action/deferral: Ordered  (06/14/2009)   Smoking status: quit  (12/06/2009)  Lipids   Total Cholesterol: 202  (03/15/2009)   LDL: 115  (03/15/2009)   LDL Direct: Not documented   HDL: 38  (03/15/2009)   Triglycerides: 245  (03/15/2009)    SGOT (AST): 19  (02/15/2009)   SGPT (ALT): 25  (02/15/2009) CMP ordered    Alkaline phosphatase: 100  (02/15/2009)   Total bilirubin: 0.7  (02/15/2009)  Self-Management Support :   Personal Goals (by the next clinic visit) :      Personal LDL goal: 160  (06/14/2009)    Patient will work on the following items until the next clinic visit to reach self-care goals:     Medications and monitoring: take my medicines every day, check my blood pressure, weigh myself weekly  (12/06/2009)     Eating: drink diet soda or water instead of juice or soda, eat more vegetables, use fresh or frozen vegetables, eat foods that are low in salt, eat baked foods instead of fried foods, eat fruit for snacks and desserts, limit or avoid alcohol  (12/06/2009)     Activity: take a 30 minute walk every day, park at the far end of the parking lot  (12/06/2009)    Lipid self-management support: Education handout, Written self-care plan, Resources for patients handout  (12/06/2009)   Lipid self-care plan printed.    Lipid education handout printed      Resource handout printed.   Process Orders Check Orders Results:     [facility]: ABN not required for this insurance Tests Sent for requisitioning (December 14, 2009 2:20 PM):  12/06/2009: [facility] -- T-TSH [phone] (signed)     12/06/2009: [facility] -- [place], [place] [[id number]] (signed)     12/06/2009: [facility] -- T-Lipid Profile [id number] (signed)     12/06/2009: [facility] -- T-Comprehensive Metabolic Panel [id number] (signed)

## 2010-11-11 NOTE — Assessment & Plan Note (Signed)
Summary: leg pain/gg   to arrive at 3:00   Vital Signs:  Patient profile:   50 year old female Height:      61 inches (154.94 cm) Weight:      252.6 pounds (113.86 kg) BMI:     47.90 Temp:     97.0 degrees F (36.11 degrees C) oral Pulse rate:   73 / minute BP sitting:   141 / 81  (left arm) Cuff size:   large  Vitals Entered By: Theotis Barrio NT II (March 28, 2010 3:17 PM) CC: PATIENT IS HERE FOR RIGHT LEG PAIN - FOR ABOUT A WEEK - # 7/ ON- OFF CHEST PAIN FOR ABOUT 2-3 DAYS Is Patient Diabetic? No  Have you ever been in a relationship where you felt threatened, hurt or afraid?No   Does patient need assistance? Functional Status Self care Ambulation Normal Comments PTIENT IS HERE FOR RIGHT LEG PAIN - FOR ABOUT A WEEK # 7/ ON - OFF CHEST PAIN FOR ABOUT 2-3   Primary Care Provider:  Elby Showers MD  CC:  PATIENT IS HERE FOR RIGHT LEG PAIN - FOR ABOUT A WEEK - # 7/ ON- OFF CHEST PAIN FOR ABOUT 2-3 DAYS.  History of Present Illness: R leg pain hamstring and down to feet. Tried ice and ibuprofen which have no helped. Started about 1 week ago. Woke up one morning and when she went to move it felt like everything in her hamstring "had ripped". Hurts more with movement. Rest makes the pain better. At best 0/10, at worst it is a 10/10. Has never had any problems with her R leg in the past.   Has on and off again chest pain. Says that she feels short of breath at times. Denies sweating, nausea or vomiting. Pain starts in chest and goes to back. Nothing makes it better. Deep breaths make it worse. Movements do not exacerbate the pain. Denies trauma. At best, pain is rated at 1/10. At worst, pain is 9/10. Pain radiates to back mostly, but has gone to neck once or twice and R arm has felt heavy once as well. States that palpation of the back and chest where it hurts does not change the pain. Has been ongoing for the past couple of days, although the pain seems to come and go. Stress test in the  past was completely normal about 6 years ago. Family history of heart disease - mother had heart attack one year ago and had three stents placed. Mother died of lung cancer 2 months ago today. Does not think that the timing of her death is related to any of this pain.  Also complaint of ongoing fatigue for the past 2 years since she was taking care of her mom 24/7.  Preventive Screening-Counseling & Management  Alcohol-Tobacco     Alcohol drinks/day: 0     Smoking Status: quit     Year Quit: 30 yrs ago  Caffeine-Diet-Exercise     Does Patient Exercise: yes     Type of exercise: WALKING     Times/week: 2-3  Current Medications (verified): 1)  Paxil 40 Mg Tabs (Paroxetine Hcl) .... Take One Tablet Daily. 2)  Alprazolam 0.5 Mg Tabs (Alprazolam) .... Take One Tab By Mouth Once Every 8 Hours As Needed For Anxiety 3)  Cvs Iron 325 (65 Fe) Mg  Tabs (Ferrous Sulfate) .... Take 1 Tablet By Mouth Three Times A Day 4)  Levothyroxine Sodium 125 Mcg Tabs (Levothyroxine Sodium) .... Take 1  Tablet By Mouth Once A Day (On Empty Stomach and 45 Minutes Apart From Iron Pills). 5)  Protonix 40 Mg Tbec (Pantoprazole Sodium) .... Take 1 Tablet By Mouth Once A Day 30 Minutes Before Breakfast. 6)  Pravastatin Sodium 40 Mg Tabs (Pravastatin Sodium) .... Take One Tab Once Daily  Allergies (verified): 1)  ! Doxycycline 2)  ! Septra  Past History:  Past Surgical History: Last updated: 02/20/2009 s/p chole s/p hysterectomy  Social History: Currently unemployed. Married. Has 2 children, one lives with her (youngest son). Denies smoking, EtOH, drugs.  Review of Systems CV:  Complains of swelling of feet; denies fainting, near fainting, and palpitations; Swelling in R foot since leg injury..  Physical Exam  General:  alert, well-developed, well-nourished, well-hydrated, and overweight-appearing.   Head:  normocephalic and atraumatic.   Eyes:  vision grossly intact.   Ears:  no external deformities.     Nose:  no external deformity, no external erythema, and no nasal discharge.   Mouth:  Dentures in place for upper teeth. Pharynx pink and moist. Lungs:  normal respiratory effort, no crackles, and no wheezes.   Heart:  normal rate, regular rhythm, no murmur, no gallop, and no rub.   Abdomen:  soft and non-tender.   Msk:  Pain in R hamstring with extension of R knee and elevation of right leg. Pain better with flexion of R knee and relaxation of the hamstring.  Neurologic:  alert & oriented X3 and cranial nerves II-XII intact.   Skin:  turgor normal, color normal, and no rashes.   Psych:  Oriented X3, memory intact for recent and remote, normally interactive, and good eye contact.     Impression & Recommendations:  Problem # 1:  MYALGIA (ICD-729.1) Patient describes tearing sensation in R hamstring after awakening and moving. Has been using ibuprofen and ice without significant relief. Pain reproduced and exacerbated with movement. Tender with leg raise and stretching of hamstring on the affected side. No neurologic symptoms and distal extremity appears warm and well-perfused. Continue conservative management. Advised patient to return to clinic if symptoms do not resolve or if there is swelling or discoloration in the affected area.  Her updated medication list for this problem includes:    Naproxen 375 Mg Tabs (Naproxen) .Marland Kitchen... Take 1 tablet by mouth two times a day    Pain Relief 500 Mg Tabs (Acetaminophen) .Marland Kitchen... Take 1-2 tabs every 4-6 hours as needed for relief of pain  Problem # 2:  CHEST PAIN, ATYPICAL (ICD-786.59) EKG completely normal, sinus rhythm at 70 bpm. No ST changes or interval abnormalities noted. Pleuritic component and no nausea, diaphoresis noted. Pain not reproucible with palpation in the chest, but patient is very pleasant and interactive in no apparent distress throughout entire examination. Reassured patient that the pain is unlikely to be cardiac related. No family  history given of early CAD and had negative stress test in 2007. May be related to costal inflammation. Treat with NSAIDs and Tylenol as needed. Advised of any changes that produce more concerning symptoms would warrant immediate re-evaluation.  Problem # 3:  OBESITY (ICD-278.00) Discussed weight loss goal of 1 lb. per week. Patient motivated and states that she has already begun to make changes to her diet. Will reassess at next office visit and continue to encourage weight loss with BMI goal of <30 initially and <25 once that goal is attained. expressed confidence that the patient will be successful if she continues to watch her diet and keep  track of her goal of weight loss frequently. Advised patient that weight can also contribute to fatigue symptoms and that she may feel more energy as she loses weight.  Complete Medication List: 1)  Paxil 40 Mg Tabs (Paroxetine hcl) .... Take one tablet daily. 2)  Alprazolam 0.5 Mg Tabs (Alprazolam) .... Take one tab by mouth once every 8 hours as needed for anxiety 3)  Cvs Iron 325 (65 Fe) Mg Tabs (Ferrous sulfate) .... Take 1 tablet by mouth three times a day 4)  Levothyroxine Sodium 125 Mcg Tabs (Levothyroxine sodium) .... Take 1 tablet by mouth once a day (on empty stomach and 45 minutes apart from iron pills). 5)  Protonix 40 Mg Tbec (Pantoprazole sodium) .... Take 1 tablet by mouth once a day 30 minutes before breakfast. 6)  Pravastatin Sodium 40 Mg Tabs (Pravastatin sodium) .... Take one tab once daily 7)  Naproxen 375 Mg Tabs (Naproxen) .... Take 1 tablet by mouth two times a day 8)  Pain Relief 500 Mg Tabs (Acetaminophen) .... Take 1-2 tabs every 4-6 hours as needed for relief of pain  Other Orders: Mammogram (Screening) (Mammo)  Patient Instructions: 1)  Please schedule a follow-up appointment in 1 month. 2)  You may move around but avoid painful motions. Apply ice and may alternate with heat to sore area for 20 minutes 3-4 times a day for 2-3  days. 3)  Take 650-1000mg  of Tylenol every 4-6 hours as needed for relief of pain or comfort of fever AVOID taking more than 4000mg   in a 24 hour period (can cause liver damage in higher doses). 4)  It is important that you exercise regularly at least 20 minutes 5 times a week. If you develop chest pain, have severe difficulty breathing, or feel very tired , stop exercising immediately and seek medical attention. 5)  You need to lose weight. Consider a lower calorie diet and regular exercise.  Prescriptions: PAIN RELIEF 500 MG TABS (ACETAMINOPHEN) Take 1-2 tabs every 4-6 hours as needed for relief of pain  #60 x 1   Entered and Authorized by:   Nilda Riggs MD   Signed by:   Nilda Riggs MD on 03/28/2010   Method used:   Electronically to        [facility] [address] (retail)       [address], [address]       [place], [place]  [address]       Ph: [phone]       Fax: [phone]   RxID:   [id number] NAPROXEN 375 MG TABS (NAPROXEN) Take 1 tablet by mouth two times a day  #30 x 0   Entered and Authorized by:   Nilda Riggs MD   Signed by:   Nilda Riggs MD on 03/28/2010   Method used:   Electronically to        [facility] [address] (retail)       [address]       [place], [place]  [address]       Ph: [phone]       Fax: [phone]   RxID:   [id number]    Prevention & Chronic Care Immunizations   Influenza vaccine: Not documented   Influenza vaccine deferral: Deferred  (06/14/2009)    Tetanus booster: 01/18/2009: Tdap   Tetanus booster due: 01/19/2019  Pneumococcal vaccine: Not documented  Other Screening   Pap smear: Not documented   Pap smear action/deferral: Not indicated S/P hysterectomy  (06/14/2009)    Mammogram: Not documented   Mammogram action/deferral: Ordered  (03/28/2010)   Smoking status: quit  (03/28/2010)  Lipids   Total Cholesterol: 181  (12/06/2009)    LDL: 111  (12/06/2009)   LDL Direct: Not documented   HDL: 37  (12/06/2009)   Triglycerides: 164  (12/06/2009)    SGOT (AST): 15  (12/06/2009)   SGPT (ALT): 18  (12/06/2009)   Alkaline phosphatase: 99  (12/06/2009)   Total bilirubin: 0.6  (12/06/2009)    Lipid flowsheet reviewed?: Yes   Progress toward LDL goal: At goal  Self-Management Support :   Personal Goals (by the next clinic visit) :      Personal LDL goal: 160  (06/14/2009)    Patient will work on the following items until the next clinic visit to reach self-care goals:     Medications and monitoring: take my medicines every day, bring all of my medications to every visit  (03/28/2010)     Eating: drink diet soda or water instead of juice or soda, eat more vegetables, use fresh or frozen vegetables, eat foods that are low in salt, eat baked foods instead of fried foods, eat fruit for snacks and desserts, limit or avoid alcohol  (03/28/2010)     Activity: take a 30 minute walk every day  (03/28/2010)    Lipid self-management support: Resources for patients handout, Written self-care plan  (03/28/2010)   Lipid self-care plan printed.      Resource handout printed.   Nursing Instructions: Schedule screening mammogram (see order)

## 2010-11-11 NOTE — Progress Notes (Signed)
Summary: appt 9/19  Phone Note Outgoing Call   Summary of Call: Pt. not here for appointment at 4:00 today. Left message for pt to call back.  Initial call taken by: Dossie Arbour, RN, BSN,  June 30, 2010 4:27 PM

## 2010-11-11 NOTE — Miscellaneous (Signed)
Summary: [place]-INITIAL SUMMARY FOR PT SERVICES  [place]-INITIAL SUMMARY FOR PT SERVICES   Imported By: Louretta Parma 06/25/2010 14:48:50  _____________________________________________________________________  External Attachment:    Type:   Image     Comment:   External Document

## 2010-11-11 NOTE — Progress Notes (Signed)
  Phone Note Outgoing Call   Call placed by: Theotis Barrio NT II,  June 02, 2010 3:39 PM Call placed to: Patient Details for Reason: CARDIOLOGY REFERRAL Summary of Call: SPOKE WITH MS [name], GAVE HER APPT INFORMATION  / [facility] - SEPT 19. 011 @4 :00 PM / LOCATED AT [address]. PATIENT IS AWARE OF THIS APPT.Marland Kitchen

## 2010-11-11 NOTE — Progress Notes (Signed)
Summary: phone/gg  Phone Note Call from Patient   Caller: Patient Summary of Call: Pt called c/o rt leg pain from thigh to foot, pain is constant, hurts to walk.  Onset 1 week ago.  no known injury.  She tried IBU and ice without relief. Also states when she takes a deep breath her chest hurts, midsternal.  Pain is sharp lasting 10 minutes.  Onset 2 - 3 days.  Deneis nausea, diaphoresis and SOB.  THis  pain is new. We have no appointments today Pt # [phone] Initial call taken by: Merrie Roof RN,  March 28, 2010 9:57 AM  Follow-up for Phone Call        OK to add to open appt slot Dr Logan Bores this afternoon. Follow-up by: Blanch Media MD,  March 28, 2010 11:02 AM  Additional Follow-up for Phone Call Additional follow up Details #1::        Pt informed and will come in today Additional Follow-up by: Merrie Roof RN,  March 28, 2010 11:20 AM

## 2010-11-14 ENCOUNTER — Other Ambulatory Visit: Payer: Self-pay | Admitting: *Deleted

## 2010-11-14 MED ORDER — PAROXETINE HCL 40 MG PO TABS: 40.0000 mg | ORAL_TABLET | ORAL | Status: DC

## 2010-11-14 NOTE — Telephone Encounter (Signed)
Pt has appointment 2/23

## 2010-12-04 ENCOUNTER — Encounter: Payer: Self-pay | Admitting: Internal Medicine

## 2010-12-22 LAB — URINALYSIS, ROUTINE W REFLEX MICROSCOPIC
Leukocytes, UA: NEGATIVE
Nitrite: NEGATIVE
pH: 7 (ref 5.0–8.0)

## 2010-12-22 LAB — CBC
HCT: 42.1 % (ref 36.0–46.0)
Hemoglobin: 14.4 g/dL (ref 12.0–15.0)
MCV: 90.7 fL (ref 78.0–100.0)
RBC: 4.64 MIL/uL (ref 3.87–5.11)
WBC: 16.9 10*3/uL — ABNORMAL HIGH (ref 4.0–10.5)

## 2010-12-22 LAB — COMPREHENSIVE METABOLIC PANEL
ALT: 18 U/L (ref 0–35)
AST: 21 U/L (ref 0–37)
Albumin: 3.9 g/dL (ref 3.5–5.2)
BUN: 13 mg/dL (ref 6–23)
Creatinine, Ser: 1.01 mg/dL (ref 0.4–1.2)
GFR calc Af Amer: >60 mL/min (ref >60)
Glucose, Bld: 103 mg/dL — ABNORMAL HIGH (ref 70–99)
Potassium: 4.2 mEq/L (ref 3.5–5.1)
Sodium: 139 mEq/L (ref 135–145)
Total Bilirubin: 1.3 mg/dL — ABNORMAL HIGH (ref 0.3–1.2)

## 2010-12-22 LAB — URINE MICROSCOPIC-ADD ON

## 2010-12-22 LAB — DIFFERENTIAL
Lymphocytes Relative: 6 % — ABNORMAL LOW (ref 12–46)
Neutrophils Relative %: 87 % — ABNORMAL HIGH (ref 43–77)

## 2010-12-25 LAB — URINALYSIS, ROUTINE W REFLEX MICROSCOPIC
Bilirubin Urine: NEGATIVE
Glucose, UA: NEGATIVE mg/dL
Hgb urine dipstick: NEGATIVE
Ketones, ur: NEGATIVE mg/dL
Nitrite: POSITIVE — AB
Specific Gravity, Urine: 1.011 (ref 1.005–1.030)
pH: 6 (ref 5.0–8.0)

## 2010-12-25 LAB — URINE MICROSCOPIC-ADD ON

## 2011-01-06 ENCOUNTER — Other Ambulatory Visit: Payer: Self-pay | Admitting: Internal Medicine

## 2011-01-12 ENCOUNTER — Ambulatory Visit: Payer: Self-pay | Admitting: Internal Medicine

## 2011-01-13 ENCOUNTER — Emergency Department (HOSPITAL_COMMUNITY)
Admission: EM | Admit: 2011-01-13 | Discharge: 2011-01-13 | Disposition: A | Discharge status: Home / Self Care | Payer: Self-pay | Attending: Emergency Medicine | Admitting: Emergency Medicine

## 2011-01-13 DIAGNOSIS — M545 Low back pain, unspecified: Secondary | ICD-10-CM | POA: Insufficient documentation

## 2011-01-13 DIAGNOSIS — IMO0002 Reserved for concepts with insufficient information to code with codable children: Secondary | ICD-10-CM | POA: Insufficient documentation

## 2011-01-16 LAB — D-DIMER, QUANTITATIVE: D-Dimer, Quant: 0.59 ug/mL-FEU — ABNORMAL HIGH (ref 0.00–0.48)

## 2011-01-16 LAB — DIFFERENTIAL
Eosinophils Relative: 1 % (ref 0–5)
Lymphocytes Relative: 10 % — ABNORMAL LOW (ref 12–46)
Lymphs Abs: 1 10*3/uL (ref 0.7–4.0)
Monocytes Relative: 5 % (ref 3–12)
Neutrophils Relative %: 84 % — ABNORMAL HIGH (ref 43–77)

## 2011-01-16 LAB — POCT I-STAT, CHEM 8
BUN: 16 mg/dL (ref 6–23)
Chloride: 110 mEq/L (ref 96–112)
Creatinine, Ser: 1 mg/dL (ref 0.4–1.2)
Sodium: 141 mEq/L (ref 135–145)

## 2011-01-16 LAB — POCT CARDIAC MARKERS
Myoglobin, poc: 68.6 ng/mL (ref 12–200)
Myoglobin, poc: 78.2 ng/mL (ref 12–200)
Troponin i, poc: <0.05 ng/mL (ref 0.00–0.09)

## 2011-01-16 LAB — CBC
HCT: 44.3 % (ref 36.0–46.0)
MCV: 88.2 fL (ref 78.0–100.0)
Platelets: 240 10*3/uL (ref 150–400)
RBC: 5.03 MIL/uL (ref 3.87–5.11)
WBC: 9.9 10*3/uL (ref 4.0–10.5)

## 2011-01-20 LAB — POCT I-STAT, CHEM 8
Creatinine, Ser: 0.7 mg/dL (ref 0.4–1.2)
HCT: 39 % (ref 36.0–46.0)
Hemoglobin: 13.3 g/dL (ref 12.0–15.0)
Sodium: 138 mEq/L (ref 135–145)
TCO2: 18 mmol/L (ref 0–100)

## 2011-01-20 LAB — URINE MICROSCOPIC-ADD ON

## 2011-01-20 LAB — DIFFERENTIAL
Basophils Relative: 1 % (ref 0–1)
Monocytes Relative: 7 % (ref 3–12)
Neutro Abs: 5.2 10*3/uL (ref 1.7–7.7)
Neutrophils Relative %: 73 % (ref 43–77)

## 2011-01-20 LAB — CBC
MCHC: 34.5 g/dL (ref 30.0–36.0)
RBC: 4.4 MIL/uL (ref 3.87–5.11)
WBC: 7.1 10*3/uL (ref 4.0–10.5)

## 2011-01-20 LAB — URINALYSIS, ROUTINE W REFLEX MICROSCOPIC
Glucose, UA: NEGATIVE mg/dL
Hgb urine dipstick: NEGATIVE
Specific Gravity, Urine: 1.014 (ref 1.005–1.030)
pH: 6 (ref 5.0–8.0)

## 2011-01-21 LAB — POCT I-STAT, CHEM 8
BUN: 15 mg/dL (ref 6–23)
Chloride: 104 mEq/L (ref 96–112)
Chloride: 107 mEq/L (ref 96–112)
Glucose, Bld: 96 mg/dL (ref 70–99)
HCT: 41 % (ref 36.0–46.0)
Potassium: 6 mEq/L — ABNORMAL HIGH (ref 3.5–5.1)
Sodium: 139 mEq/L (ref 135–145)

## 2011-02-06 ENCOUNTER — Encounter: Payer: Self-pay | Admitting: Ophthalmology

## 2011-02-24 NOTE — H&P (Signed)
[name], [name]               ACCOUNT NO.:  [id number]   MEDICAL RECORD NO.:  [id number]          PATIENT TYPE:  EMS   LOCATION:  [facility]O                         FACILITY:  [facility]   PHYSICIAN:  Dr. Daphine Deutscher             DATE OF BIRTH:  1961/03/15   DATE OF ADMISSION:  02/29/2008  DATE OF DISCHARGE:  02/29/2008                              HISTORY & PHYSICAL   ADMITTING PHYSICIAN:  Dr. Daphine Deutscher.   PRIMARY CARE PHYSICIAN:  [facility] at [facility].   CHIEF COMPLAINT:  Worsening perirectal abscess.   HISTORY OF PRESENT ILLNESS:  [name] is a 50 year old morbidly obese  patient with anxiety disorder who has not had a complete physical exam  per internal medicine for at least 2 years.  She was last seen at the  [facility] on Feb 22, 2008, for perirectal abscess.  I&D was  not done at that time.  She was started on Septra p.o. and discharged  back to her home.  The patient took the medicine for several days but  then developed nausea and vomiting.  She called the clinic and was  instructed to stop the medication which she did.  Since that time, she  has had further worsening of this perirectal abscess with increasing  pain and swelling, so she presented to the ER today.  She was found to  have a very large perirectal abscess, quite tender to touch with a white  count of 16,000.  Because of the size and location, general surgical  consultation was requested.   REVIEW OF SYSTEMS:  Again, the patient states there has not been any  drainage from this wound.  She has had increased pain and pain with  defecation.  No fevers, chills or myalgias.  She still has her menstrual  cycles.  She denies heavy periods.  She says she uses regular pads,  usually two pads per day with a 5-day cycle as her usual.  Her last  period was on Feb 12, 2008, lasting 5 days.  She has been having ice pica  for several months.   PAST MEDICAL HISTORY:  1. Anxiety disorder.  2. Obesity.   PAST  SURGICAL HISTORY:  Bilateral tubal ligation.   FAMILY HISTORY:  Noncontributory.   SOCIAL HISTORY:  No alcohol and no tobacco.   DRUG ALLERGIES:  DOXYCYCLINE, HYDROCODONE, and PERCOCET all caused  nausea when taken p.o.   PHYSICAL EXAMINATION:  GENERAL CONSTITUTIONAL:  Pleasant female patient  complaining of severe perirectal pain.  VITAL SINGS: Temperature 98.7, BP 130/69, pulse 95, respirations 20.  HEENT:  Head is normocephalic.  Sclerae are slightly injected.  Oral  mucous membranes are pink and moist.  Nose is midline.  No drainage.  Ears are atraumatic.  The patient has poor fitting dentures and she has  chronic drooling because of this.  NECK:  Trachea is midline.  Thyroid is down and nonpalpable.  CHEST:  Bilateral lung sounds are clear to auscultation anteriorly.  The  patient is sating 98% on room air.  CARDIOVASCULAR:  Heart sounds are S1-S2.  No rubs, murmurs or gallops.  Neck is thick but no appreciable JVD.  No tachycardia.  No peripheral  edema.  ABDOMEN:  Soft and obese, nontender, nondistended.  No obvious hernias.  No obvious hepatosplenomegaly, masses or bruits.  EXTREMITIES:  Symmetrical in appearance without cyanosis or clubbing.  SKIN:  The patient has a very large estimated 8 x 6 cm left gluteal  abscess extending into the rectum.  There is point tenderness with light  palpation with gentle pressure applied over the rectal sphincter region.  I was unable to accomplish a digital rectal exam.  NEUROLOGICAL:  Cranial nerves II-XII are grossly intact on this patient.  She moves all extremities x4 equally.  Sensation is intact in the upper  and lower extremities bilaterally.  PSYCH:  The patient is alert and oriented x3.  Her affect is  appropriate.   LABS:  White count 16,000, hemoglobin 8.2, hematocrit 26.2.  On the  iSTAT earlier, her hemoglobin was 9.9.  No prior baseline to compare.  Platelets 442,000, MCV 67.2.  Sodium 138, potassium 3.8, CO2 21, glucose   114, BUN 7, and creatinine 1.1.   IMPRESSION:  1. Large perirectal abscess with leukocytosis, failed outpatient      antibiotic therapy.  2. Microcytic anemia of uncertain etiology.   PLAN:  1. We will admit the patient and plan for operative intervention for      I&D of this complex abscess today.  2. Begin IV vancomycin pharmacy to dose, initial dose preop will be 1      g, creatinine is normal, no p.o. doxycycline.  If needed, we can      convert to IV doxycycline.  3. Treat symptoms with pain medications and antiemetics.  We will use      morphine, Zofran and Phenergan.  4. Because of her anemia, we will check an anemia panel.  We would do      guaiac stools but given the location of the perirectal abscess and      postoperative bleeding, these will give a false positive results.  5. We may need to obtain a teaching service consult while she is here      to workup the anemia.  She seems to be hemodynamically stable, so      we may be able to contact and arrange for outpatient follow-up      because of this.  6. Because of the patient's weight, we will check a hemoglobin A1c.      She was mildly hyperglycemic.  We want to rule out underlying      diabetic issues, especially, with severe infection.  7. We will go ahead and check a serum H. pylori on this patient with      her anemia to make sure she does not have a cold ulcerative disease      which may warrant GI consultation, although I did leave this out on      the review of systems.  The patient also reports that she does have      trouble with constipation.      Allison L. Rennis Harding, N.P.    ______________________________  Dr. Daphine Deutscher    ALE/MEDQ  D:  02/29/2008  T:  03/01/2008  Job:  [id number]   cc:   Internal Medicine Clinic

## 2011-02-24 NOTE — Discharge Summary (Signed)
[name], [name]               ACCOUNT NO.:  [id number]   MEDICAL RECORD NO.:  [id number]          PATIENT TYPE:  INP   LOCATION:  9307                          FACILITY:  [facility]   PHYSICIAN:  Phil D. Okey Dupre, M.D.     DATE OF BIRTH:  Mar 02, 1961   DATE OF ADMISSION:  05/01/2008  DATE OF DISCHARGE:  05/03/2008                               DISCHARGE SUMMARY   The patient is a 50 year old Caucasian female who underwent total  abdominal hysterectomy and bilateral salpingo-oophorectomy for  symptomatic leiomyomata uteri on the day of admission.  The surgery went  well, although she did have significant blood loss during surgery and  was transfused 1 unit of packed cells in the recovery room.  She has  done very well postoperatively.  She has been afebrile without  complaints.  She is being discharged today to home with a hemoglobin of  8.9 and a hematocrit of 26.8.  A day prior to discharge, her white blood  count was 15.1, although she has been afebrile.  Her vital signs have  been stable with a blood pressure of 109/69 and a pulse of 92 per  minute.  All other labs have been normal with the exception of a random  blood sugar of 142.  The pathology revealed benign leiomyomata uteri.  The only other significant finding was an endometrial polyp.  She has a  midline incision and will return to the GYN Clinic in 1 week for staple  removal.  She has been given detailed instructions with her daughter in  the room as to activity, diet, and medication. We are giving her  prescription to take home of 5 mg Percocet to take 2 q.4 h p.r.n. I have  encouraged her to supplement that with over-the-counter ibuprofen on a  regular basis, so that she could take less of the Percocet and not end  up constipated.  I encouraged her to take increased fluids as stool  softener.  She states she loves fruits and vegetables, and I encouraged  that.  We have discussed activity, especially that related to heavy  lifting and stairs for the first 2 weeks at home and not to drive until  she feels comfortable she could slam on the brake without discomfort or  thinking about her incision.  The patient states that she has enough of  ferrous sulfate at home that she has been on prior to coming and will  continue on that at home to correct that anemia and we will recheck CBC  when she comes in 1 week and then in her followup appointment a month  later.   DISCHARGE DIAGNOSIS:  Satisfactory progress following total abdominal  hysterectomy and bilateral salpingo-oophorectomy.      Phil D. Okey Dupre, M.D.  Electronically Signed     PDR/MEDQ  D:  05/03/2008  T:  05/04/2008  Job:  [id number]

## 2011-02-24 NOTE — Group Therapy Note (Signed)
NAMEROCKLYN, MAYBERRY NO.:  [id number]   MEDICAL RECORD NO.:  [id number]          PATIENT TYPE:  WOC   LOCATION:  [facility]                   FACILITY:  [facility]   PHYSICIAN:  Ginger Carne, MD DATE OF BIRTH:  August 29, 1961   DATE OF SERVICE:                                  CLINIC NOTE   REASON FOR CONSULTATION:  An 18-week leiomyomatous uterus, anemia of  chronic blood loss.   HISTORY OF PRESENT ILLNESS:  This patient is a 50 year old Caucasian  female gravida 2, para 2-0-0-2 who was seen today after admission to the  hospital on March 27, 2008 because of an admitting hemoglobin of 6.6 and  menorrhagia.  She had received 2 units of packed red blood cells during  the course of her admission which raised her hemoglobin to 8.5.  Indices  were consistent with microcytic form of anemia.  The patient states that  over the past 3-6 months, she has had to wear Depends pads about 3 out  of 4 weeks because of heavy bleeding.  The patient takes no medication  to enhance her bleeding propensity and has no personal family history of  bleeding diatheses.  A CT of the abdomen and pelvis demonstrated an  enlarged uterus in the pelvis.  Also there was bilateral renal scarring  with prominent ureters bilaterally.  The scarring was considered  nonspecific and was deemed to be due to possible reflux.  The patient  states that up until approximately 6 months ago, her menses were heavy,  lasting approximately 5-7 days.  She was also noted to have an elevated  TSH of 6.551 with a free T4 that was below normal.   The patient has complained of pelvic pain and also pressure in the  pelvis.  At the time of her admission in the middle of June 2009 her  creatinine was 1.1 and she had a hemoglobin A1c that was in normal  range.   OBSTETRIC/GYNECOLOGIC HISTORY:  The patient has had 2 full-term vaginal  deliveries.  She had had a tubal ligation in the past.   ALLERGIES:  None to iodine or  to latex or medication.  SHE STATES SHE  BECOMES NAUSEATED FROM DOXYCYCLINE AND SULFA MEDICATION.   CURRENT MEDICATIONS:  1. Paxil 20 mg 1 a day.  2. Xanax 1 mg p.r.n.  3. Iron 325 mg 3 times a day.   MEDICAL HISTORY:  Includes anxiety attacks.   PAST SURGICAL HISTORY:  1. Cholecystectomy in October 2000.  2. A bilateral tubal ligation in 1988.   FAMILY HISTORY:  She is unsure if her mother had ovarian or cervical  cancer in the past but otherwise no first-degree relatives with breast,  colon, ovarian, or uterine carcinoma.  Mother has hypertension.   SOCIAL HISTORY:  Patient is a nonsmoker.  Denies alcohol or illicit drug  abuse.   REVIEW OF SYSTEMS:  A 14-point comprehensive review of systems is within  normal limits.   PHYSICAL EXAMINATION:  VITAL SIGNS:  Blood pressure 134/73, weight 242  pounds, height 60 inches, pulse 78 and regular, temperature 97.4.  HEENT:  Grossly  normal.  BREASTS:  Exam without masses, discharge, thickenings or tenderness.  CHEST:  Clear to percussion and auscultation.  CARDIOVASCULAR:  Exam without murmurs or enlargements.  Regular rate and  rhythm.  Extremity, lymphatic, skin, neurological, musculoskeletal systems within  normal limits.  ABDOMEN:  Soft with 18 to 20-week mass consistent with a leiomyomatous  uterus.  PELVIC:  Pap smear performed and endometrial biopsy performed.  External  genitalia:  Vulva and vagina normal.  Cervix is smooth without erosions  or lesions.  Pelvic exam reveals palpable 18-week leiomyomatous uterus.  Both adnexa are difficult to palpate due to the patient's central  obesity.   IMPRESSION:  1. Leiomyomatous uterus with menorrhagia.  2. Anemia of chronic blood loss.  3. Hypothyroidism.   PLAN:  Pending endometrial biopsy results which may alter the approach  of surgery, the patient and will be scheduled for a total abdominal  hysterectomy with preservation of both tubes and ovaries.  She  understands that  sometimes it is difficult to save one or both ovaries  due to adhesive disease secondary to leiomyoma and then would require  possible use of hormone-replacement therapy.  The nature of said  procedure was discussed in detail.  Risks including possible injuries to  ureter, bowel and bladder, possible hemorrhage requiring a blood  transfusion, postoperative infection including superficial wound  dehiscence, fascial dehiscence, cuff cellulitis and pulmonary  complications were discussed and understood by said patient.  The  patient is a class I [place] cardiac classification.  She has no  chest pain at rest or exertion or in ordinary activity.  1. Anemia of chronic blood loss.  The patient's hemoglobin was 8.5 on      discharge from June 17th, and a repeat CBC was performed today.      She was encouraged to continue taking her iron supplementation.  2. The patient was started on 175 mcg of Synthroid to be taken 1      daily, and a repeat TSH will be performed in approximately 6 weeks.           ______________________________  Ginger Carne, MD     SHB/MEDQ  D:  04/11/2008  T:  04/11/2008  Job:  [id number]

## 2011-02-24 NOTE — Group Therapy Note (Signed)
[name], [name] NO.:  [id number]   MEDICAL RECORD NO.:  [id number]          PATIENT TYPE:  WOC   LOCATION:  [facility]                   FACILITY:  [facility]   PHYSICIAN:  Argentina Donovan, MD        DATE OF BIRTH:  02-12-1961   DATE OF SERVICE:  06/14/2008                                  CLINIC NOTE   CLINICAL NOTE:  The patient is a 50 year old white female who underwent  total abdominal hysterectomy and bilateral salpingo-oophorectomy for  large uterus on July 21.  She has done very well postoperatively.  Incision looks completely well-healed.  The patient has not yet been  started on supplemental estrogen.  Will started her on 0.25 of Premarin  and have her come back for follow-up in 6 months.  At this time after  surgery, she has adequate healing 6 weeks post total abdominal  hysterectomy and bilateral salpingo-oophorectomy.           ______________________________  Argentina Donovan, MD     PR/MEDQ  D:  06/14/2008  T:  06/14/2008  Job:  [id number]

## 2011-02-24 NOTE — Op Note (Signed)
[name], [name]               ACCOUNT NO.:  [id number]   MEDICAL RECORD NO.:  [id number]          PATIENT TYPE:  INP   LOCATION:  9307                          [facility]   PHYSICIAN:  Ginger Carne, MD  DATE OF BIRTH:  [birth year]   DATE OF PROCEDURE:  05/01/2008  DATE OF DISCHARGE:                               OPERATIVE REPORT   PREOPERATIVE DIAGNOSIS:  A 18-20 weeks leiomyomatous uterus.   POSTOPERATIVE DIAGNOSIS:  A 18-20 weeks leiomyomatous uterus.   OPERATIVE PROCEDURE:  Total abdominal hysterectomy with bilateral  salpingo-oophorectomy.   SURGEON:  Ginger Carne, MD   ASSISTANT:  Javier Glazier. Rose, MD   ESTIMATED BLOOD LOSS:  1200 mL.   ANESTHESIA:  General.   SPECIMEN:  Uterus, cervix, right and left tube and ovary weighing 1764  grams.   OPERATIVE FINDINGS:  Upon opening the abdomen, an 18-20 weeks size  cannonball-appearing leiomyomatous uterus was noted.  There was no  evidence of the utero-ovarian ligament on either side that would  facilitate preservation of both tubes and ovaries.  For this reason,  both adnexa were removed.  No evidence of ascites.  The ovaries appeared  normal as well as the tubes. Ureters identified throughout their pelvic  course during the procedure.   OPERATIVE PROCEDURE:  The patient was prepped and draped in usual  finding and placed in the supine position.  Betadine solution used for  antiseptic and the patient was catheterized prior to procedure.  After  adequate general anesthesia, a vertical paraumbilical and infraumbilical  incision was made and the abdomen opened.  Self-retaining retractor were  utilized.  Following this, the round ligaments were clamped, cut, and  ligated with 0 Vicryl suture.  Afterwards the infundibulopelvic  ligaments were clamped, cut, and ligated with 0 Vicryl suture twice.  The ureters were carefully identified throughout their pelvic course  during the procedure.  The anterior and posterior  peritoneal reflections  were then opened and in a standard Richardson fashion, the uterine  vasculature was clamped, cut, and ligated with 0 Vicryl suture.  The  uterus was amputated above the cervix to facilitate removal of the  cervix.  At this point, continuation of clamping, cutting, and ligating  the uterine vasculature followed.  At this point, the cervix was removed  in its entirety from the vaginal cuff.  Separate angle sutures were  utilized on the cuff and then closure with figure-of-eight interrupted 0  Vicryl sutures.  Bleeding points hemostatically checked.  Blood clots  removed.  Ureters rechecked and found to be peristalsis and without  injury.  Copious irrigation with lactated Ringer's followed.  Closure of  the fascia from either end to midline with double looped 0 PDS running  suture, 0  Vicryl interrupted sutures for the subcutaneous layer including Scarpa  fascia, and skin staples for the skin.  Instrument and sponge count were  correct.  The patient tolerated the procedure well and returned to the  Postanesthesia Recovery Room in excellent condition.      Ginger Carne, MD  Electronically Signed  SHB/MEDQ  D:  05/01/2008  T:  05/02/2008  Job:  [id number]

## 2011-02-24 NOTE — Discharge Summary (Signed)
NAMEFALISA, LAMORA NO.:  [id number]   MEDICAL RECORD NO.:  [id number]          PATIENT TYPE:  INP   LOCATION:  5118                         FACILITY:  [facility]   PHYSICIAN:  Eliseo Gum, M.D.   DATE OF BIRTH:  09-14-1961   DATE OF ADMISSION:  03/27/2008  DATE OF DISCHARGE:  03/28/2008                               DISCHARGE SUMMARY   DISCHARGE DIAGNOSES:  Anemia, microcytic hypochromic secondary to  uterine bleeding.   SECONDARY DIAGNOSIS:  Fibroids.   OTHER DIAGNOSES:  1. Anxiety.  2. History of panic attack.  3. Morbid obesity.  4. Hemorrhoid.  5. Atypical chest pain.  6. Hypertriglyceridemia.  7. Perirectal abscess with an incision and drainage on Feb 29, 2008.  8. History of mastoiditis.   PAST SURGICAL HISTORY:  1. Cholecystectomy.  2. Tubal ligation.   DISCHARGE MEDICATIONS:  1. Paxil 20 mg by mouth daily.  2. Darvocet-N 100, 650 mg every 6 hours for pain as needed.  3. Ferrous sulfate 325 mg by mouth t.i.d.   DISPOSITION AND FOLLOWUP:  Ms. Simerson has an appointment at the  outpatient clinic on April 05, 2008, at 3.15.  She will need to be  followed up on her CBC to follow her anemia, and also the resolution of  the abdominal pain need to be followed up.  Labs that need to be  followed during that office visit are: TSH level, T3, and T4 level.  Also, methylmalonic acid and homocysteine level.  If these are  consistent with B12 deficiency, intrinsic factor antibody need to be  order.   PROCEDURE PERFORMED:  CT abdomen showed no acute process in the abdomen.  Bilateral renal scarring, prominent uterus.  The ureteric prominence is  likely related to uterine process below.  The scarring is nonspecific  but can be seen with reflux nephropathy.  CT of the pelvis shows massive  uterine enlargement most likely related to numerous ill-defined  fibroids.  Next, possible right rectal vault thickening, recommended  physical exam correlation.   BRIEF HISTORY OF PRESENT ILLNESS:  A 50 year old woman with past medical  history significant for anxiety, microcytic anemia noted on Feb 19, 2008.  Perirectal abscess with incision and drainage on Feb 29, 2008.  History of hemorrhoid and hypertriglyceridemia, who presented to the  emergency department complaining of abdominal pain.  The patient reports  that one day prior to admission, she had a left upper quadrant abdominal  pain, the patient reports pain as sharp, 3/10, not radiating,  intermittent, not worse with movements and not associated with fevers,  chills, vomiting, melena or hematochezia, hematuria, dysuria or flank  pain, dizziness and no chest pain.  It is associated with exertional  shortness of breath.  This is chronic and she has increased urinary  frequency and some history of constipation for several weeks ago,  associated with her perirectal abscess.  She has not had a colonoscopy.  No recent travel, no sick contacts.   PHYSICAL EXAMINATION:  VITAL SIGNS:  Temperature 97.3, blood pressure  122/75, pulse 80, respiration 20, oxygen saturation 98% on room air,  weight 243.  GENERAL:  She was in no acute distress, obese patient.  EYES:  Pupils equal and react to light.  Extraocular muscles intact.  RESPIRATION:  Clear to auscultation.  CARDIOVASCULAR:  S1 and S2, regular rhythm and rate.  No murmurs, no  rubs, or no gallops.  GASTROINTESTINAL:  Positive bowel sounds.  Left upper quadrant  tenderness.  No guarding, no rigidity, and no rebound.  No left lower  quadrant pain on exam.  EXTREMITIES:  Mild bilateral lower extremity edema.  No cyanosis.  SKIN:  No jaundice and no rashes.  NEURO:  She was alert and oriented.  Cranial nerves intact.  Motor exam  5/5.   LABORATORY DATA:  Sodium 139, potassium 3.8, chloride 103, bicarb 6, BUN  12, creatinine 1.1, glucose 92, white blood cells, CBC 7.8, hemoglobin  6.6, hematocrit 20.9, platelet 304, and MCV 67.6, RDW 19.  UA  negative,  FOBT negative.   HOSPITAL COURSE:  1. Anemia Microcytic Hypochromic secondary to uterine bleeding.  CT of      the abdomen was performed for her abdominal pain.  She was found to      have uterine fibroid, she was admitted to regular floor.  She      received 2 packs of red blood cell.  It was considered that her      anemia could be also secondary to B12 deficiency.  B12 was      borderline low at 300.  Methylmalonic acid and homocysteine is      pending.  After transfusion, hemoglobin increased to 8.6.  Patient      during hospitalization, she did not have any vaginal bleeding.      Guaiac is still negative x2.  She will follow up as an outpatient      with the gynecologist for her fibroids.   1. Abdominal pain.  The patient was admitted to regular floor.  CT of      the abdomen was performed.  On CT, the only finding was the fibroid      and some possible right rectal wall thickening.  During the first      day of hospitalization, the abdominal pain resolved.  Unclear      etiology of abdominal pain could be secondary to the fibroids.   1. S3 heart sound.  On physical exam, the patient had an S3 heart      sound.  This can be a normal variant or this could be secondary to      flow related secondary to the anemia, this need to be followed.  If      the patient continues to have the S3 sounds or she develops      dyspnea, a 2-D echo will be recommended.   DISCHARGE LABS AND VITALS:  On the day of discharge, Ms. Trivedi was  stable in good condition.  Her systolic blood pressure was 120/62, pulse  83, respirations 18, sat 97% on room air, and temperature 98.4.  PT/OT  evaluated the patient and the patient was at baseline.  Labs, white  blood cells 8.6, hemoglobin 8.6, hematocrit 26.1, platelets 288, sodium  137, potassium 3.5, chloride 105, bicarb 25, BUN 7, creatinine 0.87, and  glucose 135.      Hartley Barefoot, MD  Electronically Signed      Eliseo Gum,  M.D.  Electronically Signed    BR/MEDQ  D:  03/28/2008  T:  03/29/2008  Job:  [id number]

## 2011-02-24 NOTE — Discharge Summary (Signed)
[name], [name] NO.:  [id number]   MEDICAL RECORD NO.:  [id number]          PATIENT TYPE:  INP   LOCATION:  5118                         FACILITY:  [facility]   PHYSICIAN:  Eliseo Gum, M.D.   DATE OF BIRTH:  [birth year]   DATE OF ADMISSION:  03/27/2008  DATE OF DISCHARGE:  03/28/2008                               DISCHARGE SUMMARY   CONTINUITY DOCTOR:  Dr. Onalee Hua, gynecologist at [facility].   CONSULTANTS:  None.   DISCHARGE DIAGNOSES:  1. Anemia microcytic hypochromic secondary to uterine bleeding,      secondary to fibroid.  2. Uterine fibroids.  3. History of panic attack, anxiety.  4. Morbid obesity.  5. History of hemorrhoids.  6. Hypertriglyceridemia.  7. History of perirectal abscess with incision and drainage on Feb 29, 2008.  8. History of mastoiditis and sinusitis.   PAST SURGICAL HISTORY:  Cholecystectomy and tubal ligation.   DISCHARGE MEDICATIONS:  1. Paxil 20 mg by mouth daily.  2. Darvocet 100, 650 mg 1 p.o. every 6 hours for pain as needed.  3. Ferrous sulphate 325 mg p.o. 3 times a day.   DISPOSITION AND FOLLOWUP:  [name] has an appointment at the  outpatient clinic with Dr. Onalee Hua.  At that moment, she needs to re-  follow up on resolution of the abdominal pain and of her anemia.  During  that appointment also methylmalonic acid and homocysteine level need to  be followed up, and if positive, consider intrinsic factor to rule out  pernicious anemia.  Also, she needs to be followed up on a result of  TSH, free T4 level during that appointment.  She will also follow up  with a gynecologist at [facility].  She has an appointment  on April 11, 2008, at 1:45 p.m.   PROCEDURE PERFORMED:  CT abdomen: 1.  No acute process in the abdomen.  Bilateral renal scarring with prominent ureters bilaterally.The ureteric  prominence is likely related to uterine process below.    Scarring is nonspecific but can be seen  with reflux nephropathy  (predominance at the upper and lower pole regions)      CT pelvis: 1.  Massive uterine enlargement most likely related to  numerous ill-defined fibroids.  Of note, the uterus is not described as  abnormal on the 06/22/2001 study.  This presumably relates to interval  development of uterine fibroids. Possible right rectal wall thickening.  Recommend physical exam    correlation.    BRIEF HISTORY OF PRESENT ILLNESS:  A 50 year old woman with past medical  history significant for anxiety, microcytic anemia noted on Feb 19, 2008.  Perirectal abscess with incision and drainage on Feb 29, 2008.  History of hemorrhoid and hypertriglyceridemia, who presented to the  emergency department complaining of abdominal pain.  The patient reports  that one day prior to admission, she had a left upper quadrant abdominal  pain, the patient reports pain as sharp, 3/10, not radiating,  intermittent, not worse with movements and not associated with fevers,  chills, vomiting, melena or hematochezia,  hematuria, dysuria or flank  pain, dizziness and no chest pain.  It is associated with exertional  shortness of breath.  This is chronic and she has increased urinary  frequency and some history of constipation for several weeks ago,  associated with her perirectal abscess.  She has not had a colonoscopy.  No recent travel, no sick contacts.   PHYSICAL EXAMINATION:  VITAL SIGNS:  Temperature 97.3, blood pressure  122/75, pulse 80, respiration 20, oxygen saturation 98% on room air,  weight 243.  GENERAL:  She was in no acute distress, obese patient.  EYES:  Pupils equal and react to light.  Extraocular muscles intact.  RESPIRATION:  Clear to auscultation.  CARDIOVASCULAR:  S1 and S2, regular rhythm and rate.  No murmurs, no  rubs, or no gallops.  GASTROINTESTINAL:  Positive bowel sounds.  Left upper quadrant  tenderness.  No guarding, no rigidity, and no rebound.  No left lower   quadrant pain on exam.  EXTREMITIES:  Mild bilateral lower extremity edema.  No cyanosis.  SKIN:  No jaundice and no rashes.  NEURO:  She was alert and oriented.  Cranial nerves intact.  Motor exam  5/5.   LABORATORY DATA:  Sodium 139, potassium 3.8, chloride 103, bicarb 6, BUN  12, creatinine 1.1, glucose 92, white blood cells, CBC 7.8, hemoglobin  6.6, hematocrit 20.9, platelet 304, and MCV 67.6, RDW 19.  UA negative,  FOBT negative.   HOSPITAL COURSE:  1. Anemia Microcytic Hypochromic secondary to uterine bleeding.  CT of      the abdomen was performed for her abdominal pain. She was found to      have uterine fibroid, she was admitted to regular floor.  She      received 2 packs of red blood cell.  It was considered that her      anemia could be also secondary to B12 deficiency.  B12 was      borderline low at 300.  Methylmalonic acid and homocysteine is      pending.  After transfusion, hemoglobin increased to 8.6.  Patient      during hospitalization, she did not have any vaginal bleeding.      Guaiac is still negative x2.  She will follow up as an outpatient      with the gynecologist for her fibroids.   1. Abdominal pain.  The patient was admitted to regular floor.  CT of      the abdomen was performed.  On CT, the only finding was the fibroid      and some possible right rectal wall thickening.  During the first      day of hospitalization, the abdominal pain resolved.  Unclear      etiology of abdominal pain could be secondary to the fibroids.   1. S3 heart sound.  On physical exam, the patient had an S3 heart      sound.  This can be a normal variant or this could be secondary to      flow related secondary to the anemia, this need to be followed.  If      the patient continues to have the S3 sounds or she develops      dyspnea, a 2-D echo will be recommended.   DISCHARGE LABS AND VITALS:  On the day of discharge, [name] was  stable in good condition.  Her systolic  blood pressure was 120/62, pulse  83, respirations 18, sat 97% on room air,  and temperature 98.4.  PT/OT  evaluated the patient and the patient was at baseline.  Labs, white  blood cells 8.6, hemoglobin 8.6, hematocrit 26.1, platelets 288, sodium  137, potassium 3.5, chloride 105, bicarb 25, BUN 7, creatinine 0.87, and  glucose 135.       DICTATION ENDS AT THIS POINT.      Hartley Barefoot, MD  Electronically Signed      Eliseo Gum, M.D.  Electronically Signed    BR/MEDQ  D:  03/28/2008  T:  03/29/2008  Job:  [id number]   cc:   [facility]  Dr. Onalee Hua [facility]

## 2011-02-24 NOTE — Group Therapy Note (Signed)
NAMEDALLIE, PATTON NO.:  [id number]   MEDICAL RECORD NO.:  [id number]          PATIENT TYPE:  WOC   LOCATION:  [facility]                   FACILITY:  [facility]   PHYSICIAN:  Karlton Lemon, MD      DATE OF BIRTH:  03-05-1961   DATE OF SERVICE:                                  CLINIC NOTE   REASON FOR VISIT:  One-week followup from total abdominal hysterectomy  with bilateral salpingo-oophorectomy on postoperative day #9 and staple  removal.   HISTORY OF PRESENT ILLNESS:  Ms. Wismer is a 50 year old female that  had a total abdominal hysterectomy and bilateral salpingo-oophorectomy  for a 20-week leiomyomatous uterus performed by Dr. Mia Creek on May 01, 2008.  The patient is 9 days postop and presents today for staple  removal.  She is doing fairly well, but states she is having some  redness around the incision.  She started feeling nauseous last night,  but has had no vomiting.  She states she is having normal bowel  movements.  She is otherwise doing well.   PHYSICAL EXAMINATION:  Abdomen is soft.  Incision looks good with some  mild erythema around insertion of the staples.  Staples are removed  without any complication or wound suppuration.  Steri-Strips were  placed.   ASSESSMENT/PLAN:  A 50 year old female postoperative day #9 from a total  abdominal hysterectomy and bilateral salpingo-oophorectomy.  1. Staples are removed today.  Steri-Strips are then placed.  The      patient was counseled to remove the Steri-Strips in 1 week if they      have not fallen off.  2. The patient is to follow up in 5 weeks for her postoperative      examination in the G[facility] Clinic.           ______________________________  Karlton Lemon, MD     NS/MEDQ  D:  05/10/2008  T:  05/10/2008  Job:  [id number]

## 2011-03-10 ENCOUNTER — Encounter: Payer: Self-pay | Admitting: Internal Medicine

## 2011-04-27 ENCOUNTER — Ambulatory Visit (INDEPENDENT_AMBULATORY_CARE_PROVIDER_SITE_OTHER): Payer: Self-pay

## 2011-04-27 ENCOUNTER — Inpatient Hospital Stay (INDEPENDENT_AMBULATORY_CARE_PROVIDER_SITE_OTHER)
Admission: RE | Admit: 2011-04-27 | Discharge: 2011-04-27 | Disposition: A | Discharge status: Home / Self Care | Payer: Self-pay | Source: Ambulatory Visit | Attending: Family Medicine | Admitting: Family Medicine

## 2011-04-27 DIAGNOSIS — S93609A Unspecified sprain of unspecified foot, initial encounter: Secondary | ICD-10-CM

## 2011-05-25 ENCOUNTER — Emergency Department (HOSPITAL_COMMUNITY): Payer: Self-pay

## 2011-05-25 ENCOUNTER — Encounter: Payer: Self-pay | Admitting: Internal Medicine

## 2011-05-25 ENCOUNTER — Observation Stay (HOSPITAL_COMMUNITY)
Admission: EM | Admit: 2011-05-25 | Discharge: 2011-05-27 | Disposition: A | Discharge status: Home / Self Care | Payer: Self-pay | Attending: Internal Medicine | Admitting: Internal Medicine

## 2011-05-25 DIAGNOSIS — F410 Panic disorder [episodic paroxysmal anxiety] without agoraphobia: Secondary | ICD-10-CM | POA: Insufficient documentation

## 2011-05-25 DIAGNOSIS — R0609 Other forms of dyspnea: Principal | ICD-10-CM | POA: Insufficient documentation

## 2011-05-25 DIAGNOSIS — R079 Chest pain, unspecified: Secondary | ICD-10-CM | POA: Insufficient documentation

## 2011-05-25 DIAGNOSIS — F411 Generalized anxiety disorder: Secondary | ICD-10-CM | POA: Insufficient documentation

## 2011-05-25 DIAGNOSIS — R0989 Other specified symptoms and signs involving the circulatory and respiratory systems: Principal | ICD-10-CM | POA: Insufficient documentation

## 2011-05-25 DIAGNOSIS — E039 Hypothyroidism, unspecified: Secondary | ICD-10-CM | POA: Insufficient documentation

## 2011-05-25 DIAGNOSIS — E669 Obesity, unspecified: Secondary | ICD-10-CM | POA: Insufficient documentation

## 2011-05-25 LAB — URINALYSIS, ROUTINE W REFLEX MICROSCOPIC
Bilirubin Urine: NEGATIVE
Glucose, UA: NEGATIVE mg/dL
Hgb urine dipstick: NEGATIVE
Ketones, ur: NEGATIVE mg/dL
Protein, ur: 100 mg/dL — AB
Urobilinogen, UA: 0.2 mg/dL (ref 0.0–1.0)

## 2011-05-25 LAB — COMPREHENSIVE METABOLIC PANEL
ALT: 23 U/L (ref 0–35)
AST: 23 U/L (ref 0–37)
Albumin: 3.9 g/dL (ref 3.5–5.2)
Alkaline Phosphatase: 105 U/L (ref 39–117)
CO2: 28 mEq/L (ref 19–32)
Chloride: 104 mEq/L (ref 96–112)
Creatinine, Ser: 0.9 mg/dL (ref 0.50–1.10)
GFR calc non Af Amer: >60 mL/min (ref >60)
Potassium: 4.2 mEq/L (ref 3.5–5.1)
Total Bilirubin: 0.5 mg/dL (ref 0.3–1.2)

## 2011-05-25 LAB — CBC
HCT: 40.8 % (ref 36.0–46.0)
MCV: 89.7 fL (ref 78.0–100.0)
RBC: 4.55 MIL/uL (ref 3.87–5.11)
WBC: 8.4 10*3/uL (ref 4.0–10.5)

## 2011-05-25 LAB — DIFFERENTIAL
Lymphocytes Relative: 16 % (ref 12–46)
Lymphs Abs: 1.3 10*3/uL (ref 0.7–4.0)
Neutro Abs: 6.6 10*3/uL (ref 1.7–7.7)
Neutrophils Relative %: 78 % — ABNORMAL HIGH (ref 43–77)

## 2011-05-25 LAB — CARDIAC PANEL(CRET KIN+CKTOT+MB+TROPI)
CK, MB: 1.4 ng/mL (ref 0.3–4.0)
Relative Index: INVALID (ref 0.0–2.5)
Total CK: 36 U/L (ref 7–177)
Troponin I: <0.3 ng/mL (ref <0.30)

## 2011-05-25 LAB — PRO B NATRIURETIC PEPTIDE: Pro B Natriuretic peptide (BNP): 38.1 pg/mL (ref 0–125)

## 2011-05-25 LAB — TSH: TSH: 6.316 u[IU]/mL — ABNORMAL HIGH (ref 0.350–4.500)

## 2011-05-25 NOTE — H&P (Signed)
Hospital Admission Note Date: 05/25/2011  Patient name: [name] Medical record number: [id number] Date of birth: 10/10/1961 Age: 50 y.o. Gender: female PCP: Dierdre Searles, NA, MD, MD  Medical Service: Teaching Service  Attending physician:  Margarito Liner   Resident (R2/R3):  Henreitta Cea   Pager: [phone] Resident (R1):  Kathreen Cosier Pager: [phone]  Chief Complaint: Shortness of breath  History of Present Illness: This is a 50 year old woman with a history of anxiety who presents with 2 weeks of dyspnea on exertion. Patient noted that roughly 2 weeks ago she started feeling short of breath with minimal ambulation (chores around the house), a while prior to that she could walk however long she wanted. This dyspnea is nonprogressive and has been stable for the past weeks, and the patient denies orthopnea, PND, or cough. She's been a noted snorer, but does not have a formal diagnosis of obstructive sleep apnea.  In addition to her dyspnea, the patient had 2 episodes of chest pain over the past 2 weeks. The first was 2 weeks ago and occurred while she was doing chores around the house. She sat down and had relief from the chest pain. The pain is described as a chest discomfort, as the patient has difficulty describing exactly how it feels. The second episode was similar and occurred on Saturday night (2 nights ago) while she was doing some chores around the house. She again had relief with rest. She has successfully done chores around the house without chest pain aside from these 2 incidents. She also notes occasional chest pain with cough though she rarely coughs. She has no known coronary artery disease, no hypertension, no hyperlipidemia, and she does not smoke.  Her family history is not significant for myocardial infarction at a young age.  The patient notes recent stressors in life including open heart surgery for her husband, as well as her daughter and her daughter's boyfriend fighting  frequently. She verbalized that she felt that it was stress related that she wanted to come get evaluated. When she called the clinic to make an appointment they told her to come to the emergency room instead.  Past Medical Hx: Diagnosis Date  . Hypertriglyceridemia     164 - 2/11  . Panic disorder 11/05  . Obesity     250 lbs 8/11  . DJD (degenerative joint disease)     Right knee pain - no imaging noted, no DJD on left knee.  Marland Kitchen Atypical chest pain     Negative cardiolite at [facility] cards 11/07  . Anemia, iron deficiency     Requried 2u prbc 6/09, menometrorrhagia, uterine fibroids.  . Hypothyroidism   . Insomnia   . Allergic rhinitis   . Elevated BP    Past Surgical History  Procedure Date  . Cholecystectomy 2000  . Other hysterectomy     Meds: Paxil 40mg  PO daily Occasional xanax prn (less than once/month)  Allergies: Doxycycline and Sulfamethoxazole w/trimethoprim  Family History  Problem Relation Age of Onset  . Heart attack Mother   . Lung cancer Mother     metestatic, + smoker  . Aneurysm Mother   . Hyperlipidemia Mother   . Hypertension Mother   . Hypertension Father    History   Social History  . Marital Status: Married    Spouse Name: N/A    Number of Children: N/A  . Years of Education: N/A   Occupational History  . Not on file.   Social History Main Topics  .  Smoking status: Never Smoker   . Smokeless tobacco: Not on file  . Alcohol Use: No  . Drug Use: No  . Sexually Active: Not on file   Other Topics Concern  . Not on file   Social History Narrative   Currently unemployed.  Married, has 2 children lives with her youngest son.      Review of Systems: As per history of present illness  Physical Exam: 98.7 117/101 76 18 97%/RA GEN: No apparent distress.  Alert and oriented x 3.  Pleasant, conversant, and cooperative to exam. HEENT: head is autraumatic and normocephalic.  EOMI.  PERRLA.  Sclerae anicteric. RESP:  Lungs are clear to  ascultation bilaterally with good air movement.  No wheezes, ronchi, or rubs. CARDIOVASCULAR: regular rate, normal rhythm.  Clear S1, S2, 2/6 holosystolic murmur at the right upper sternal border.  ABDOMEN: soft, non-tender, non-distended.  Bowels sounds present in all quadrants and normoactive.  No palpable masses. EXT: warm and dry. No clubbing or cyanosis.  No edema in b/l lower extremeties SKIN: warm and dry with normal turgor.  No rashes or abnormal lesions observed. NEURO: CN II-XII grossly intact.  Muscle strength grossly intact.  Sensation is grossly intact.  No focal deficit.  Lab results: CBC: Recent Labs  Basename 05/25/11 1135   WBC 8.4   NEUTROABS 6.6   HGB 13.3   HCT 40.8   MCV 89.7   PLT 230   Basic Metabolic Panel: Recent Labs  Basename 05/25/11 1135   NA 140   K 4.2   CL 104   CO2 28   GLUCOSE 102*   BUN 12   CREATININE 0.90   CALCIUM 9.4   MG --   PHOS --   Liver Function Tests: Recent Labs  Basename 05/25/11 1135   AST 23   ALT 23   ALKPHOS 105   BILITOT 0.5   PROT 8.0   ALBUMIN 3.9   Cardiac Enzymes: Troponin POC: 0  BNP: Recent Labs  Basename 05/25/11 1135   POCBNP 38.1   D-Dimer: Recent Labs  Basename 05/25/11 1135   DDIMER 0.41    Urinalysis: Negative  Imaging results:  Chest x-ray: Mild left lower lobe atelectasis, otherwise unremarkable.  Other results: EKG: Normal sinus rhythm with T wave inversions in V1 (similar when compared with prior) otherwise unremarkable  Assessment & Plan by Problem: #Chest pain This patient has had 2 episodes of chest pain over the past 2 weeks, both exertional in nature and relieved with rest. This chest pain does not have classic symptoms of squeezing, crushing, or radiation to the jaw/arm, but the patient has had such typical chest pain previously.  She has no known CAD and has had negative workups for chest pain the past, however her last negative stress test was in 2007.  Aside from MI,  possible etiologies include PE (negative d-dimer), GERD (patient had previously been prescribed PPI), or anxiety. We will continue to cycle the patient's cardiac enzymes and follow her closely. --Stress test in a.m. --2-D echo --Cycle cardiac enzymes --Fasting lipid panel --HbA1c --TSH --ASA 81 --Nitroglycerin as needed  #Dyspnea on exertion This patient presents with 2 weeks of nonprogressive dyspnea on exertion with no extensive cardiac history though multiple ED and clinic visits for both typical and atypical chest pain. Possible etiologies include CHF (normal chest x-ray, normal proBNP, normal exam), pulmonary embolism (negative d-dimer), myocardial infarction (initial troponin negative, nonreproducible and atypical angina). This could also be related to her  ongoing anxiety, and the patient does admit to recent social stresses. Less likely causes include an organic pulmonary etiology (negative chest x-ray) or and obesity hypoventilation syndrome. --2-D echo  #Anxiety Patient not feeling anxious at this time though she does note a history of panic attacks. We'll continue her on her home dose of Paxil. --Paxil 40 mg by mouth daily  #?Hypothryoidism Patient and records indicate a history of hypothyroidism formerly on Synthroid replacement, but has not been on it for some time. She says that at her last clinic appointment they did not mention it and she assumed that it was normal. --TSH  #Ppx: --Lovenox   R2/3______________________________      R1________________________________  ATTENDING: I performed and/or observed a history and physical examination of the patient.  I discussed the case with the residents as noted and reviewed the residents' notes.  I agree with the findings and plan--please refer to the attending physician note for more details.  Signature________________________________  Printed Name_____________________________

## 2011-05-26 ENCOUNTER — Observation Stay (HOSPITAL_COMMUNITY): Payer: Self-pay

## 2011-05-26 DIAGNOSIS — R072 Precordial pain: Secondary | ICD-10-CM

## 2011-05-26 LAB — LIPID PANEL
Cholesterol: 202 mg/dL — ABNORMAL HIGH (ref 0–200)
HDL: 38 mg/dL — ABNORMAL LOW (ref >39)
Total CHOL/HDL Ratio: 5.3 RATIO

## 2011-05-26 LAB — CARDIAC PANEL(CRET KIN+CKTOT+MB+TROPI)
Relative Index: INVALID (ref 0.0–2.5)
Relative Index: INVALID (ref 0.0–2.5)
Total CK: 36 U/L (ref 7–177)
Troponin I: <0.3 ng/mL (ref <0.30)

## 2011-05-26 LAB — HEMOGLOBIN A1C: Hgb A1c MFr Bld: 6 % — ABNORMAL HIGH (ref <5.7)

## 2011-05-27 DIAGNOSIS — R079 Chest pain, unspecified: Secondary | ICD-10-CM

## 2011-05-27 MED ORDER — TECHNETIUM TC 99M TETROFOSMIN IV KIT
30.0000 | PACK | Freq: Once | INTRAVENOUS | Status: AC | PRN
  Administered 2011-05-27: 30 via INTRAVENOUS

## 2011-05-27 MED ORDER — TECHNETIUM TC 99M TETROFOSMIN IV KIT
30.0000 | PACK | Freq: Once | INTRAVENOUS | Status: AC | PRN
  Administered 2011-05-26: 30 via INTRAVENOUS

## 2011-06-09 ENCOUNTER — Other Ambulatory Visit: Payer: Self-pay | Admitting: Family Medicine

## 2011-06-09 DIAGNOSIS — Z1231 Encounter for screening mammogram for malignant neoplasm of breast: Secondary | ICD-10-CM

## 2011-06-09 NOTE — Discharge Summary (Signed)
[name], [name] NO.:  [id number]  MEDICAL RECORD NO.:  [id number]  LOCATION:  2018                         FACILITY:  [facility]  PHYSICIAN:  Ileana Roup, M.D.  DATE OF BIRTH:  Aug 17, 1961  DATE OF ADMISSION:  05/25/2011 DATE OF DISCHARGE:  05/27/2011                              DISCHARGE SUMMARY   DISCHARGE DIAGNOSES: 1. Dyspnea on exertion. 2. Chest pain. 3. Anxiety. 4. Panic disorder. 5. Obesity. 6. Hypothyroidism.  DISCHARGE MEDICATIONS: 1. Levothyroxine 50 mcg p.o. daily. 2. Paroxetine 20 mg p.o. daily. 3. Xanax 0.5 mg p.o. daily as needed.  DISPOSITION AND FOLLOWUP:  [facility] with Dr. Manson Passey on June 17, 2011 at 9:30 a.m.  At this appointment, the patient's dyspnea on exertion should be evaluated.  If it has continued, she should probably be set up for PFTs given her normal echo and normal nuclear medicine study to evaluate for possible pulmonary etiology.  She should also have her TSH rechecked given that she just started Synthroid, but potentially not this soon after starting.  PROCEDURES PERFORMED:  A 2-D echocardiogram, Myoview nuclear medicine study.  CONSULTATIONS:  None.  BRIEF ADMITTING HISTORY AND PHYSICAL:  This is a 50 year old woman with a history of anxiety who presents with two weeks of dyspnea on exertion. The patient noted roughly 2 weeks ago she started feeling short of breath with minimal ambulation while prior she could walk, cover a long she wanted to.  The dyspnea is nonprogressive and has been stable for the past 2 weeks and the patient denies orthopnea, PND, or cough.  In addition to dyspnea, she has noted two episodes of chest pain both exertional in nature that have seated with rest.  It is difficult for her to describe the chest pain, but it is a chest discomfort as she points to the center or left side of her chest.  She also has notes occasional chest pain when she coughs, but she  really coughs.  She has no known CAD, no hypertension, no hyperlipidemia, and does not smoke. Her family history is not significant for MI at young age.  The patient does note recent stressors in her life including open heart surgery for her husband as well as her daughter and her daughter's boyfriend biting frequently.  She feels that this all may be stress-related, but wanted to come and get evaluated.  ADMISSION PHYSICAL EXAMINATION:  VITAL SIGNS:  Temperature 98.7, blood pressure 117/101, pulse of 76, respiratory rate 18, O2 sat 97% on room air. GENERAL:  She is a middle-aged, obese woman in no acute distress. LUNGS:  Clear to auscultation bilaterally. CARDIOVASCULAR:  Regular rate and rhythm.  Normal S1-S2 and a 2/6 holosystolic murmur at the right upper sternal border. ABDOMEN:  Soft, nontender, and nondistended.  Bowel sounds present. EXTREMITIES:  Warm and dry.  No edema. NEUROLOGIC:  Grossly intact.  LABORATORY DATA:  CBC; WBC 8.4, hemoglobin 13.3, hematocrit 40.8, platelets 230.  Sodium 148, potassium 4.2, chloride 104, CO2 28, BUN 12, creatinine 0.9, glucose 102.  Cardiac enzymes were initially negative. BNP was 38.1 and D-dimer was 0.41.  HOSPITAL COURSE BY PROBLEM: 1. Chest pain.  This patient  has two episodes of chest pain with     exertion were concerning for cardiac etiology, initial cardiac     enzymes were negative.  D-dimer ruled her out for pulmonary     embolism and an echocardiogram showed a normal ejection fraction     and normal cardiac motion.  The patient received a Myoview scan     while in the hospital that was also negative essentially ruling out     acute coronary pathology.  This patient has risk factors and given     the lack of continued pain, acute coronary syndrome was felt to be     minimal.  Other etiologies of the chest pain were worked up and     also found to be negative. 2. Dyspnea on exertion.  This patient reported 2 weeks of      nonprogressive dyspnea on exertion concerning for congestive heart     failure or other noncardiac pathology with a normal echo and     negative Myoview.  It was felt that acute pathologies were ruled     out.  Next step with this the patient would likely be PFTs as an     outpatient.  This could also be all anxiety related and the patient     notes that she has stressors in her life at this time, this could     also be related to her hypothyroidism and she could be feeling     fatigued from that. 3. Hypothyroidism.  The patient has a history of hypothyroidism, but     was not on any thyroid supplementation at this time.  She was found     to have a TSH of 6 and was started on 50 mcg of Synthroid that most     likely will have to be titrated up slowly over time.  DISCHARGE LABORATORY STUDIES AND VITAL SIGNS:  Vital signs at discharge; temperature 97.9, blood pressure 127/74, pulse of 71, respirations 20, O2 sat 99% on room air.  Laboratory studies; hemoglobin A1c 6.0.  No other new labs.  Again, this patient was discharged home in stable condition with negative cardiac enzymes, negative Myoview and normal echo with outpatient follow up for continued workup of her dyspnea on exertion with likely next step being her PFTs and whatever else to be the [facility] feels is appropriate.    ______________________________ Kathreen Cosier, MD   ______________________________ Ileana Roup, M.D.    BW/MEDQ  D:  05/27/2011  T:  05/28/2011  Job:  [id number]  cc:   [facility] Janalyn Harder, MD  Electronically Signed by Kathreen Cosier MD on 05/29/2011 12:15:25 PM Electronically Signed by Margarito Liner M.D. on 06/09/2011 07:03:07 PM

## 2011-06-12 ENCOUNTER — Ambulatory Visit (HOSPITAL_COMMUNITY)
Admission: RE | Admit: 2011-06-12 | Discharge: 2011-06-12 | Disposition: A | Discharge status: Home / Self Care | Payer: Self-pay | Source: Ambulatory Visit | Attending: Family Medicine | Admitting: Family Medicine

## 2011-06-12 DIAGNOSIS — Z1231 Encounter for screening mammogram for malignant neoplasm of breast: Secondary | ICD-10-CM

## 2011-06-17 ENCOUNTER — Ambulatory Visit (INDEPENDENT_AMBULATORY_CARE_PROVIDER_SITE_OTHER): Payer: Self-pay | Admitting: Internal Medicine

## 2011-06-17 ENCOUNTER — Encounter: Payer: Self-pay | Admitting: Internal Medicine

## 2011-06-17 VITALS — BP 128/79 | HR 78 | Temp 97.7°F | Ht 61.0 in | Wt 281.2 lb

## 2011-06-17 DIAGNOSIS — E039 Hypothyroidism, unspecified: Secondary | ICD-10-CM

## 2011-06-17 DIAGNOSIS — R0602 Shortness of breath: Secondary | ICD-10-CM | POA: Insufficient documentation

## 2011-06-17 DIAGNOSIS — F410 Panic disorder [episodic paroxysmal anxiety] without agoraphobia: Secondary | ICD-10-CM

## 2011-06-17 MED ORDER — ALBUTEROL 90 MCG/ACT IN AERS: 2.0000 | INHALATION_SPRAY | Freq: Four times a day (QID) | RESPIRATORY_TRACT | Status: DC | PRN

## 2011-06-17 MED ORDER — PAROXETINE HCL 40 MG PO TABS: 40.0000 mg | ORAL_TABLET | ORAL | Status: DC

## 2011-06-17 NOTE — Assessment & Plan Note (Addendum)
Diagnosed 2008 via TSH, no treatment/surgery pursued, patient started on synthroid 125 mcg at that time, took the medication for 2-3 months, then was untreated until 05/2011, when she was restarted on synthroid 50.  Patient reports full compliance with this medication, stating that it is now less expensive than it previously had been.  She reports an increase in energy level since starting this medication. -continue synthroid 50

## 2011-06-17 NOTE — Progress Notes (Signed)
Hospital Follow-up  HPI The patient is a 50 yo woman with a history of anxiety, hyperlipidemia, and panic disorder, presenting for a hospital follow-up for shortness of breath.  The patient notes a 1 month history of shortness of breath when walking <1 block, which is new for her (previously could walk as long as she wanted), and has remained constant, with no orthopnea, PND, or LE edema.  She was hospitalized about 2 weeks ago (8/13-8/15) for this complaint, and an Echo (EF = 55-60%) and myoview were negative, and a d-dimer was also negative.  She notes no significant change in symptoms during hospitalization, or since discharge.  She notes no history of asthma or COPD, no smoking history, and no prior inhaler use.  The patient also notes a history of hypothyroidism, diagnosed in 2008 by an outside physician.  No procedures/surgery were performed, the patient was started on synthroid 125 mcg, took this medication for a couple of months, then stopped taking this medication, and was untreated until her recent hospitalization 2 weeks ago, where she was started on Synthroid 50 mcg, which she has been taking daily.  She believes she is already feeling more energetic on this medication, though it has not impacted her SOB.  No fevers, coughing, or wheezing.  ROS: General: no fevers, chills, changes in weight, changes in appetite Skin: no rash HEENT: no blurry vision, hearing changes, sore throat Pulm: see HPI CV: no chest pain, palpitations, shortness of breath Abd: no abdominal pain, nausea/vomiting, diarrhea/constipation GU: no dysuria, hematuria, polyuria Ext: no arthralgias, myalgias Neuro: no weakness, numbness, or tingling  Filed Vitals:   06/17/11 0945  BP: 128/79  Pulse: 78  Temp: 97.7 F (36.5 C)    PEX General: alert, cooperative, and in no apparent distress HEENT: pupils equal round and reactive to light, vision grossly intact, oropharynx clear and non-erythematous  Neck: supple, no  lymphadenopathy, JVD, or carotid bruits Lungs: clear to ascultation bilaterally, normal work of respiration, no wheezes, rales, ronchi Heart: regular rate and rhythm, no murmurs, gallops, or rubs Abdomen: soft, obese, non-tender, non-distended, normal bowel sounds Msk: no joint edema, warmth, or erythema Extremities: 2+ DP/PT pulses bilaterally, no cyanosis, clubbing, or edema Neurologic: alert & oriented X3, cranial nerves II-XII intact, strength grossly intact, sensation intact to light touch  Assessment/Plan

## 2011-06-17 NOTE — Assessment & Plan Note (Signed)
The patient notes a 43-month history of shortness of breath, unchanged since hospital discharge 05/27/11.  Echo, myoview normal.  No history of asthma, COPD, or tobacco use. -patient referred for outpatient PFT's to evaluate obstructive vs restrictive lung disease -patient started on an albuterol inhaler for symptomatic relief.  Patient was given a sample inhaler for the purposes of demonstrating how to use an inhaler, and due to cited financial difficulties in obtaining medications.

## 2011-06-17 NOTE — Patient Instructions (Signed)
For your shortness of breath, we want to obtain Pulmonary Function Tests to evaluate your breathing -in the meantime, you may use an albuterol inhaler, 2 puffs, up to every 4 hours as needed -if you find that you are using your albuterol inhaler daily, return to clinic, and we may need to start you on a different daily inhaler  For your hypothyroidism, continue to take Synthroid.  We will re-check your thyroid levels in a few months  Please return for a follow-up visit in 1-2 months, or sooner if your symptoms persist.

## 2011-06-18 NOTE — Progress Notes (Signed)
I have reviewed and discussed the care of this patient in detail with the resident physician including pertinent patient records, physical exam findings and data. I agree with details of this encounter.   

## 2011-07-08 LAB — DIFFERENTIAL
Basophils Relative: 0
Eosinophils Relative: 1
Lymphs Abs: 0.6 — ABNORMAL LOW
Monocytes Relative: 5
Neutrophils Relative %: 90 — ABNORMAL HIGH

## 2011-07-08 LAB — CBC
HCT: 26.2 — ABNORMAL LOW
Hemoglobin: 8.2 — ABNORMAL LOW
MCHC: 31.2
RBC: 3.88
RDW: 19.2 — ABNORMAL HIGH

## 2011-07-08 LAB — CULTURE, ROUTINE-ABSCESS

## 2011-07-08 LAB — POCT I-STAT, CHEM 8
BUN: 7
Creatinine, Ser: 1.1
Glucose, Bld: 114 — ABNORMAL HIGH
Potassium: 3.8
Sodium: 138

## 2011-07-09 LAB — BASIC METABOLIC PANEL
BUN: 7
CO2: 25
Chloride: 105
Creatinine, Ser: 0.97
Glucose, Bld: 135 — ABNORMAL HIGH

## 2011-07-09 LAB — IRON AND TIBC
Iron: 12 — ABNORMAL LOW
UIBC: 354

## 2011-07-09 LAB — CBC
HCT: 26.1 — ABNORMAL LOW
HCT: 26.5 — ABNORMAL LOW
Hemoglobin: 8.5 — ABNORMAL LOW
MCHC: 31.6
MCV: 67.6 — ABNORMAL LOW
MCV: 71.8 — ABNORMAL LOW
MCV: 72.5 — ABNORMAL LOW
Platelets: 285
Platelets: 304
Platelets: 304
RBC: 3.09 — ABNORMAL LOW
RBC: 3.6 — ABNORMAL LOW
WBC: 7.8
WBC: 8.6
WBC: 8.8

## 2011-07-09 LAB — CROSSMATCH: ABO/RH(D): A NEG

## 2011-07-09 LAB — DIFFERENTIAL
Basophils Absolute: 0.1
Lymphocytes Relative: 19
Lymphs Abs: 1.5
Monocytes Relative: 7
Neutrophils Relative %: 70

## 2011-07-09 LAB — COMPREHENSIVE METABOLIC PANEL
ALT: 14
AST: 17
Alkaline Phosphatase: 71
CO2: 27
Calcium: 8.8
Chloride: 106
GFR calc Af Amer: >60
GFR calc non Af Amer: 56 — ABNORMAL LOW
Glucose, Bld: 101 — ABNORMAL HIGH
Potassium: 4
Sodium: 139
Total Bilirubin: 0.6

## 2011-07-09 LAB — TROPONIN I: Troponin I: 0.02

## 2011-07-09 LAB — POCT I-STAT, CHEM 8
Calcium, Ion: 1.17
Chloride: 103
Glucose, Bld: 92
HCT: 23 — ABNORMAL LOW
Hemoglobin: 7.8 — CL

## 2011-07-09 LAB — LIPASE, BLOOD: Lipase: 60 — ABNORMAL HIGH

## 2011-07-09 LAB — POCT PREGNANCY, URINE
Operator id: 295131
Operator id: 148111
Preg Test, Ur: NEGATIVE

## 2011-07-09 LAB — URINALYSIS, ROUTINE W REFLEX MICROSCOPIC
Ketones, ur: NEGATIVE
Leukocytes, UA: NEGATIVE
Nitrite: NEGATIVE
Specific Gravity, Urine: 1.017
pH: 7

## 2011-07-09 LAB — HEMOGLOBIN A1C
Hgb A1c MFr Bld: 5.8
Mean Plasma Glucose: 129

## 2011-07-09 LAB — TSH: TSH: 8.529 — ABNORMAL HIGH

## 2011-07-09 LAB — RETICULOCYTES: Retic Ct Pct: 2

## 2011-07-09 LAB — HAPTOGLOBIN: Haptoglobin: 164

## 2011-07-09 LAB — URINE MICROSCOPIC-ADD ON

## 2011-07-09 LAB — CARDIAC PANEL(CRET KIN+CKTOT+MB+TROPI): Troponin I: 0.01

## 2011-07-09 LAB — METHYLMALONIC ACID, SERUM: Methylmalonic Acid, Quantitative: 175 nmol/L (ref 87–318)

## 2011-07-10 LAB — URINALYSIS, ROUTINE W REFLEX MICROSCOPIC
Glucose, UA: NEGATIVE
Leukocytes, UA: NEGATIVE
Protein, ur: 30 — AB
Specific Gravity, Urine: 1.02
Urobilinogen, UA: 0.2

## 2011-07-10 LAB — CROSSMATCH: Antibody Screen: NEGATIVE

## 2011-07-10 LAB — COMPREHENSIVE METABOLIC PANEL
ALT: 16
AST: 15
Albumin: 3.5
Alkaline Phosphatase: 92
BUN: 11
Chloride: 103
GFR calc Af Amer: >60
Potassium: 4.5
Total Bilirubin: 0.5

## 2011-07-10 LAB — BASIC METABOLIC PANEL
CO2: 23
Calcium: 8 — ABNORMAL LOW
Creatinine, Ser: 0.85
GFR calc Af Amer: >60
GFR calc non Af Amer: >60

## 2011-07-10 LAB — CBC
HCT: 29.3 — ABNORMAL LOW
MCHC: 33
MCHC: 32.9
MCV: 74.7 — ABNORMAL LOW
Platelets: 302
RBC: 3.38 — ABNORMAL LOW
RDW: 22.9 — ABNORMAL HIGH
RDW: 21.5 — ABNORMAL HIGH
WBC: 7.2

## 2011-07-10 LAB — ABO/RH: ABO/RH(D): A NEG

## 2011-07-10 LAB — HEMOGLOBIN AND HEMATOCRIT, BLOOD
HCT: 29.5 — ABNORMAL LOW
Hemoglobin: 9.7 — ABNORMAL LOW

## 2011-07-10 LAB — URINE MICROSCOPIC-ADD ON

## 2011-07-10 LAB — DIFFERENTIAL
Basophils Absolute: 0
Eosinophils Absolute: 0.1
Monocytes Absolute: 0.5
Neutrophils Relative %: 74

## 2011-07-14 LAB — POCT CARDIAC MARKERS: Myoglobin, poc: 74

## 2011-07-14 LAB — POCT I-STAT, CHEM 8
Hemoglobin: 12.2
Sodium: 140
TCO2: 25

## 2011-07-17 ENCOUNTER — Encounter: Payer: Self-pay | Admitting: Internal Medicine

## 2011-07-17 ENCOUNTER — Ambulatory Visit (INDEPENDENT_AMBULATORY_CARE_PROVIDER_SITE_OTHER): Payer: Self-pay | Admitting: Internal Medicine

## 2011-07-17 VITALS — BP 121/76 | HR 84 | Temp 97.0°F | Ht 61.0 in | Wt 285.2 lb

## 2011-07-17 DIAGNOSIS — Z23 Encounter for immunization: Secondary | ICD-10-CM

## 2011-07-17 DIAGNOSIS — N611 Abscess of the breast and nipple: Secondary | ICD-10-CM | POA: Insufficient documentation

## 2011-07-17 MED ORDER — CLINDAMYCIN HCL 150 MG PO CAPS: 150.0000 mg | ORAL_CAPSULE | Freq: Two times a day (BID) | ORAL | Status: AC

## 2011-07-17 NOTE — Progress Notes (Signed)
  Subjective:   Patient ID: [name] female   DOB: 05/29/1961 50 y.o.   MRN: [id number]  HPI: [name] is a 50 y.o. female with PMH significant as outlined below who presented to the clinic with a right breast lesion. Patient noted it started on Monday . First it was swollen and painful and then some it opened  up and some blood came out. Denies any pus, significant redness , fevers or chills. Does not recall of any insect bite.     Past Medical History  Diagnosis Date  . Hypertriglyceridemia     164 - 2/11  . Panic disorder 11/05  . Obesity     250 lbs 8/11  . DJD (degenerative joint disease)     Right knee pain - no imaging noted, no DJD on left knee.  Marland Kitchen Atypical chest pain     Negative cardiolite at [facility] cards 11/07  . Anemia, iron deficiency     Requried 2u prbc 6/09, menometrorrhagia, uterine fibroids.  . Hypothyroidism   . Insomnia   . Allergic rhinitis   . Elevated BP    Current Outpatient Prescriptions  Medication Sig Dispense Refill  . albuterol (PROVENTIL,VENTOLIN) 90 MCG/ACT inhaler Inhale 2 puffs into the lungs every 6 (six) hours as needed for wheezing.  17 g  12  . ALPRAZolam (XANAX) 0.5 MG tablet Take one tablet by mouth every evening as needed for sleep       . clindamycin (CLEOCIN) 150 MG capsule Take 1 capsule (150 mg total) by mouth 2 (two) times daily.  14 capsule  0  . levothyroxine (SYNTHROID, LEVOTHROID) 50 MCG tablet Take 50 mcg by mouth daily.        Marland Kitchen PARoxetine (PAXIL) 40 MG tablet Take 1 tablet (40 mg total) by mouth every morning.  30 tablet  11  Review of Systems: Constitutional: Denies fever, chills, diaphoresis, Respiratory: Denies SOB, DOE, cough, chest tightness,  and wheezing.   Cardiovascular: Denies chest pain, palpitations and leg swelling.  Gastrointestinal: Denies nausea, vomiting, abdominal pain, diarrhea, constipation, blood in stool and abdominal distention.  Neurological: Denies dizziness,weakness, light-headedness,  numbness and headaches.    Objective:  Physical Exam: Filed Vitals:   07/17/11 1112  BP: 121/76  Pulse: 84  Temp: 97 F (36.1 C)  TempSrc: Oral  Height: 5\' 1"  (1.549 m)  Weight: 285 lb 3.2 oz (129.366 kg)  SpO2: 96%   Constitutional: Vital signs reviewed.  Patient is a well-developed and well-nourished in no acute distress and cooperative with exam. Alert and oriented x3. .  Neck: Supple,  Warm, dry and intact. No rash, cyanosis, or clubbing.  Cardiovascular: RRR, S1 normal, S2 normal, no MRG, pulses symmetric and intact bilaterally Pulmonary/Chest: CTAB, no wheezes, rales, or rhonchi Abdominal: Soft. Non-tender, non-distended, bowel sounds are normal,  Neurological: A&O x3,  no focal motor deficit, sensory intact to light touch bilaterally.  Skin: right breast ( medial, left lower quadrant): red, bumpy lesion without any discharge. Warm to touch. One inch in size with surrounding erythema

## 2011-07-17 NOTE — Assessment & Plan Note (Signed)
Likely furuncle. Will start her on clindamycin since patient has allergy to doxycycline and sulfa for 7 days. Furthermore recommended to use warm compresses in that area. I underlined the importance to come back to the clinic if patient notices significant increase in size  associated with fever and chills.

## 2011-07-17 NOTE — Progress Notes (Deleted)
  Subjective:    Patient ID: [name], female    DOB: [birth year], 50 y.o.   MRN: [id number]  HPI    Review of Systems     Objective:   Physical Exam        Assessment & Plan:

## 2011-08-23 ENCOUNTER — Emergency Department (HOSPITAL_COMMUNITY)
Admission: EM | Admit: 2011-08-23 | Discharge: 2011-08-23 | Disposition: A | Discharge status: Home / Self Care | Payer: Self-pay | Attending: Emergency Medicine | Admitting: Emergency Medicine

## 2011-08-23 ENCOUNTER — Encounter (HOSPITAL_COMMUNITY): Payer: Self-pay | Admitting: Emergency Medicine

## 2011-08-23 ENCOUNTER — Emergency Department (HOSPITAL_COMMUNITY): Payer: Self-pay

## 2011-08-23 DIAGNOSIS — R1013 Epigastric pain: Secondary | ICD-10-CM | POA: Insufficient documentation

## 2011-08-23 DIAGNOSIS — Z79899 Other long term (current) drug therapy: Secondary | ICD-10-CM | POA: Insufficient documentation

## 2011-08-23 DIAGNOSIS — R10816 Epigastric abdominal tenderness: Secondary | ICD-10-CM | POA: Insufficient documentation

## 2011-08-23 DIAGNOSIS — E039 Hypothyroidism, unspecified: Secondary | ICD-10-CM | POA: Insufficient documentation

## 2011-08-23 DIAGNOSIS — R112 Nausea with vomiting, unspecified: Secondary | ICD-10-CM | POA: Insufficient documentation

## 2011-08-23 DIAGNOSIS — Z9889 Other specified postprocedural states: Secondary | ICD-10-CM | POA: Insufficient documentation

## 2011-08-23 LAB — LIPASE, BLOOD: Lipase: 23 U/L (ref 11–59)

## 2011-08-23 LAB — COMPREHENSIVE METABOLIC PANEL
ALT: 17 U/L (ref 0–35)
Albumin: 3.6 g/dL (ref 3.5–5.2)
Calcium: 9.7 mg/dL (ref 8.4–10.5)
GFR calc Af Amer: 88 mL/min — ABNORMAL LOW (ref >90)
Glucose, Bld: 91 mg/dL (ref 70–99)
Potassium: 4.4 mEq/L (ref 3.5–5.1)
Sodium: 140 mEq/L (ref 135–145)
Total Protein: 7.8 g/dL (ref 6.0–8.3)

## 2011-08-23 LAB — CBC
MCH: 31.2 pg (ref 26.0–34.0)
MCHC: 34.2 g/dL (ref 30.0–36.0)
MCV: 91.1 fL (ref 78.0–100.0)
Platelets: 227 10*3/uL (ref 150–400)
RDW: 13.5 % (ref 11.5–15.5)

## 2011-08-23 LAB — URINALYSIS, ROUTINE W REFLEX MICROSCOPIC
Hgb urine dipstick: NEGATIVE
Nitrite: NEGATIVE
Specific Gravity, Urine: 1.012 (ref 1.005–1.030)
Urobilinogen, UA: 0.2 mg/dL (ref 0.0–1.0)
pH: 6 (ref 5.0–8.0)

## 2011-08-23 LAB — DIFFERENTIAL
Basophils Absolute: 0 10*3/uL (ref 0.0–0.1)
Basophils Relative: 0 % (ref 0–1)
Eosinophils Absolute: 0.1 10*3/uL (ref 0.0–0.7)
Eosinophils Relative: 1 % (ref 0–5)
Lymphs Abs: 1.4 10*3/uL (ref 0.7–4.0)
Neutrophils Relative %: 81 % — ABNORMAL HIGH (ref 43–77)

## 2011-08-23 LAB — URINE MICROSCOPIC-ADD ON

## 2011-08-23 MED ORDER — SODIUM CHLORIDE 0.9 % IV BOLUS (SEPSIS)
1000.0000 mL | Freq: Once | INTRAVENOUS | Status: AC
  Administered 2011-08-23: 1000 mL via INTRAVENOUS

## 2011-08-23 MED ORDER — SODIUM CHLORIDE 0.9 % IV SOLN: Freq: Once | INTRAVENOUS | Status: DC

## 2011-08-23 MED ORDER — ONDANSETRON HCL 4 MG PO TABS: 4.0000 mg | ORAL_TABLET | Freq: Four times a day (QID) | ORAL | Status: AC

## 2011-08-23 MED ORDER — ONDANSETRON HCL 4 MG/2ML IJ SOLN
4.0000 mg | Freq: Once | INTRAMUSCULAR | Status: AC
  Administered 2011-08-23: 4 mg via INTRAVENOUS
  Filled 2011-08-23: qty 2

## 2011-08-23 MED ORDER — MORPHINE SULFATE 4 MG/ML IJ SOLN
4.0000 mg | Freq: Once | INTRAMUSCULAR | Status: AC
  Administered 2011-08-23: 4 mg via INTRAVENOUS
  Filled 2011-08-23: qty 1

## 2011-08-23 MED ORDER — IOHEXOL 300 MG/ML  SOLN
100.0000 mL | Freq: Once | INTRAMUSCULAR | Status: AC | PRN
  Administered 2011-08-23: 100 mL via INTRAVENOUS

## 2011-08-23 MED ORDER — ONDANSETRON HCL 4 MG PO TABS: 4.0000 mg | ORAL_TABLET | Freq: Four times a day (QID) | ORAL | Status: DC

## 2011-08-23 NOTE — ED Notes (Signed)
Pt  Drinking contrast , tol well.

## 2011-08-23 NOTE — ED Provider Notes (Signed)
History     CSN: [id number] Arrival date & time: 08/23/2011 10:43 AM   First MD Initiated Contact with Patient 08/23/11 1147      Chief Complaint  Patient presents with  . Abdominal Pain  . Nausea  . Emesis    (Consider location/radiation/quality/duration/timing/severity/associated sxs/prior treatment) HPI Comments: Patient reports that she has had epigastric pain, nausea, and vomiting for the past two days.  She denies any fevers.  Denies diarrhea.  She reports that the epigastric pain improves somewhat when she is in the fetal position.  She has been having regular bowel movements.  Last BM was earlier today and was normal.  PMH significant for cholecystectomy and hysterectomy.  She denies any recent alcohol use.  Patient is a 50 y.o. female presenting with abdominal pain and vomiting. The history is provided by the patient.  Abdominal Pain The primary symptoms of the illness include abdominal pain, nausea and vomiting. The primary symptoms of the illness do not include fever, fatigue, shortness of breath, diarrhea, hematemesis, hematochezia, dysuria, vaginal discharge or vaginal bleeding. The current episode started 2 days ago. The onset of the illness was gradual. The problem has been gradually worsening.  Symptoms associated with the illness do not include chills, diaphoresis, constipation, hematuria or frequency. Significant associated medical issues do not include diverticulitis.  Emesis  Associated symptoms include abdominal pain. Pertinent negatives include no chills, no diarrhea, no fever and no headaches.    Past Medical History  Diagnosis Date  . Hypertriglyceridemia     164 - 2/11  . Panic disorder 11/05  . Obesity     250 lbs 8/11  . Atypical chest pain     Negative cardiolite at [facility] cards 11/07  . Anemia, iron deficiency     Requried 2u prbc 6/09, menometrorrhagia, uterine fibroids.  . Hypothyroidism   . Insomnia   . Allergic rhinitis   . Elevated BP      Past Surgical History  Procedure Date  . Cholecystectomy 2000  . Other hysterectomy     Family History  Problem Relation Age of Onset  . Heart attack Mother   . Lung cancer Mother     metestatic, + smoker  . Aneurysm Mother   . Hyperlipidemia Mother   . Hypertension Mother   . Hypertension Father     History  Substance Use Topics  . Smoking status: Never Smoker   . Smokeless tobacco: Not on file  . Alcohol Use: No    OB History    Grav Para Term Preterm Abortions TAB SAB Ect Mult Living                  Review of Systems  Constitutional: Negative for fever, chills, diaphoresis and fatigue.  HENT: Negative for neck pain and neck stiffness.   Respiratory: Negative for shortness of breath.   Gastrointestinal: Positive for nausea, vomiting and abdominal pain. Negative for diarrhea, constipation, hematochezia, abdominal distention, anal bleeding and hematemesis.  Genitourinary: Negative for dysuria, frequency, hematuria, vaginal bleeding and vaginal discharge.  Skin: Negative for color change.  Neurological: Negative for dizziness, syncope, weakness, light-headedness and headaches.    Allergies  Doxycycline; Oxycodone; and Sulfamethoxazole w/trimethoprim  Home Medications   Current Outpatient Rx  Name Route Sig Dispense Refill  . ALBUTEROL 90 MCG/ACT IN AERS Inhalation Inhale 2 puffs into the lungs every 6 (six) hours as needed for wheezing. 17 g 12  . ALPRAZOLAM 0.5 MG PO TABS Oral Take 0.5 mg by mouth 3 (  three) times daily as needed. Take one tablet by mouth every evening as needed for sleep    . LEVOTHYROXINE SODIUM 50 MCG PO TABS Oral Take 50 mcg by mouth daily.      Marland Kitchen PAROXETINE HCL 40 MG PO TABS Oral Take 1 tablet (40 mg total) by mouth every morning. 30 tablet 11  . ONDANSETRON HCL 4 MG PO TABS Oral Take 1 tablet (4 mg total) by mouth every 6 (six) hours. 12 tablet 0    BP 116/68  Pulse 62  Temp(Src) 97.6 F (36.4 C) (Oral)  Resp 18  SpO2 98%  LMP  06/17/2007  Physical Exam  Constitutional: She is oriented to person, place, and time. Vital signs are normal. She appears well-developed and well-nourished.  Non-toxic appearance.       Uncomfortable appearing.  HENT:  Head: Normocephalic and atraumatic.  Eyes: EOM are normal. Pupils are equal, round, and reactive to light.  Neck: Normal range of motion. Neck supple.  Cardiovascular: Normal rate, regular rhythm and normal heart sounds.  Exam reveals no gallop and no friction rub.   No murmur heard. Pulmonary/Chest: Effort normal and breath sounds normal. No respiratory distress. She has no wheezes. She has no rales. She exhibits no tenderness.  Abdominal: Soft. Bowel sounds are normal. She exhibits no distension, no ascites and no mass. There is no hepatosplenomegaly. There is tenderness in the epigastric area. There is no rigidity, no rebound, no guarding and no CVA tenderness.  Musculoskeletal: Normal range of motion.  Neurological: She is alert and oriented to person, place, and time.  Skin: Skin is warm and dry. She is not diaphoretic.  Psychiatric: She has a normal mood and affect. Her behavior is normal.    ED Course  Procedures (including critical care time)  Labs Reviewed  CBC - Abnormal; Notable for the following:    WBC 10.8 (*)    All other components within normal limits  DIFFERENTIAL - Abnormal; Notable for the following:    Neutrophils Relative 81 (*)    Neutro Abs 8.8 (*)    All other components within normal limits  COMPREHENSIVE METABOLIC PANEL - Abnormal; Notable for the following:    GFR calc non Af Amer 76 (*)    GFR calc Af Amer 88 (*)    All other components within normal limits  URINALYSIS, ROUTINE W REFLEX MICROSCOPIC - Abnormal; Notable for the following:    Protein, ur 30 (*)    All other components within normal limits  LIPASE, BLOOD  URINE MICROSCOPIC-ADD ON  LAB REPORT - SCANNED   Ct Abdomen Pelvis W Contrast  08/23/2011  *RADIOLOGY REPORT*   Clinical Data: Epigastric pain, prior cholecystectomy and hysterectomy  CT ABDOMEN AND PELVIS WITH CONTRAST  Technique:  Multidetector CT imaging of the abdomen and pelvis was performed following the standard protocol during bolus administration of intravenous contrast.  Contrast: OMNIPAQUE IOHEXOL 300 MG/ML IV SOLN  Comparison: 10/02/2010  Findings: Lung bases are essentially clear.  Hepatic steatosis.  Spleen, pancreas, and adrenal glands are within normal limits.  Status post cholecystectomy.  No intrahepatic or extrahepatic ductal dilatation.  Kidneys are notable for scattered areas of cortical scarring.  No hydronephrosis.  No evidence of bowel obstruction.  Normal appendix.  Colonic diverticulosis, without associated inflammatory changes.  No evidence of abdominal aortic aneurysm.  No abdominopelvic ascites.  No suspicious abdominopelvic lymphadenopathy.  Status post hysterectomy.  No adnexal masses.  Bladder is within normal limits.  Mild degenerative changes  of the visualized thoracolumbar spine.  IMPRESSION: Normal appendix.  No evidence of bowel obstruction.  Colonic diverticulosis, without associate inflammatory changes.  Hepatic steatosis.  Original Report Authenticated By: Charline Bills, M.D.     1. Abdominal pain       MDM  Normal CT scan.  Pain improved with pain medication.  Feel patient is stable for discharge.  Patient reports that she did not want any prescription pain medication at discharge.  Instructed patient to follow up with PCP.        Pascal Lux [facility] 08/24/11 2108

## 2011-08-23 NOTE — ED Notes (Signed)
Pt reports upper abdominal pain with N/V x a few days.

## 2011-08-25 NOTE — ED Provider Notes (Signed)
Medical screening examination/treatment/procedure(s) were conducted as a shared visit with non-physician practitioner(s) and myself.  I personally evaluated the patient during the encounter  Toy Baker, MD 08/25/11 [phone]

## 2011-09-16 ENCOUNTER — Encounter (HOSPITAL_COMMUNITY): Payer: Self-pay | Admitting: Emergency Medicine

## 2011-09-16 ENCOUNTER — Emergency Department (HOSPITAL_COMMUNITY)
Admission: EM | Admit: 2011-09-16 | Discharge: 2011-09-17 | Disposition: A | Discharge status: Home / Self Care | Payer: Self-pay | Attending: Emergency Medicine | Admitting: Emergency Medicine

## 2011-09-16 ENCOUNTER — Other Ambulatory Visit: Payer: Self-pay | Admitting: Internal Medicine

## 2011-09-16 ENCOUNTER — Telehealth: Payer: Self-pay | Admitting: *Deleted

## 2011-09-16 DIAGNOSIS — F410 Panic disorder [episodic paroxysmal anxiety] without agoraphobia: Secondary | ICD-10-CM

## 2011-09-16 DIAGNOSIS — R10819 Abdominal tenderness, unspecified site: Secondary | ICD-10-CM | POA: Insufficient documentation

## 2011-09-16 DIAGNOSIS — E039 Hypothyroidism, unspecified: Secondary | ICD-10-CM | POA: Insufficient documentation

## 2011-09-16 DIAGNOSIS — K5900 Constipation, unspecified: Secondary | ICD-10-CM | POA: Insufficient documentation

## 2011-09-16 DIAGNOSIS — R109 Unspecified abdominal pain: Secondary | ICD-10-CM | POA: Insufficient documentation

## 2011-09-16 MED ORDER — PAROXETINE HCL 20 MG PO TABS: 20.0000 mg | ORAL_TABLET | Freq: Every day | ORAL | Status: DC

## 2011-09-16 NOTE — Telephone Encounter (Signed)
Pt states that the 20 mg is much more cost efficent for her, it is $4.00 at [facility], could we please change this and send a new script to [facility]?

## 2011-09-16 NOTE — ED Notes (Signed)
Pt alert, nad, c/o lower abd pain, onset a few days ago, pt states decreased bowel movements, loss of appetite, resp even unlabored, skin pwd

## 2011-09-17 ENCOUNTER — Emergency Department (HOSPITAL_COMMUNITY): Payer: Self-pay

## 2011-09-17 LAB — COMPREHENSIVE METABOLIC PANEL
ALT: 22 U/L (ref 0–35)
AST: 19 U/L (ref 0–37)
Albumin: 3.8 g/dL (ref 3.5–5.2)
Alkaline Phosphatase: 119 U/L — ABNORMAL HIGH (ref 39–117)
Chloride: 103 mEq/L (ref 96–112)
Potassium: 3.8 mEq/L (ref 3.5–5.1)
Sodium: 139 mEq/L (ref 135–145)
Total Bilirubin: 0.6 mg/dL (ref 0.3–1.2)

## 2011-09-17 LAB — CBC
MCHC: 33.5 g/dL (ref 30.0–36.0)
RDW: 13.3 % (ref 11.5–15.5)
WBC: 13.6 10*3/uL — ABNORMAL HIGH (ref 4.0–10.5)

## 2011-09-17 LAB — URINE MICROSCOPIC-ADD ON

## 2011-09-17 LAB — URINALYSIS, ROUTINE W REFLEX MICROSCOPIC
Bilirubin Urine: NEGATIVE
Glucose, UA: NEGATIVE mg/dL
Hgb urine dipstick: NEGATIVE
Ketones, ur: NEGATIVE mg/dL
Leukocytes, UA: NEGATIVE
pH: 6 (ref 5.0–8.0)

## 2011-09-17 LAB — DIFFERENTIAL
Basophils Absolute: 0.1 10*3/uL (ref 0.0–0.1)
Basophils Relative: 0 % (ref 0–1)
Lymphocytes Relative: 13 % (ref 12–46)
Neutro Abs: 10.9 10*3/uL — ABNORMAL HIGH (ref 1.7–7.7)
Neutrophils Relative %: 80 % — ABNORMAL HIGH (ref 43–77)

## 2011-09-17 MED ORDER — MAGNESIUM CITRATE PO SOLN
1.0000 | Freq: Once | ORAL | Status: AC
  Administered 2011-09-17: 1 via ORAL
  Filled 2011-09-17: qty 296

## 2011-09-17 MED ORDER — POLYETHYLENE GLYCOL 3350 17 GM/SCOOP PO POWD: 17.0000 g | Freq: Every day | ORAL | Status: AC

## 2011-09-17 NOTE — ED Notes (Signed)
Dr Norlene Campbell in to see/ continues to pass stool

## 2011-09-17 NOTE — ED Notes (Signed)
Up to BR starting to get small results from mag citrate

## 2011-09-17 NOTE — ED Provider Notes (Signed)
History     CSN: [id number] Arrival date & time: 09/16/2011  8:36 PM   First MD Initiated Contact with Patient 09/16/11 2359      Chief Complaint  Patient presents with  . Abdominal Pain    lower abd     (Consider location/radiation/quality/duration/timing/severity/associated sxs/prior treatment) Patient is a 51 y.o. female presenting with abdominal pain.  Abdominal Pain The primary symptoms of the illness include abdominal pain.   50 year old female presents to emergency department with lower abdominal pain and constipation for 2 days. Patient denies any fever or bloating or vomiting. Patient reports last bm was 3 days ago. She has taken Dulcolax twice without improvement in symptoms. Patient reports remote history of diverticulitis. Patient had CT scan 3 weeks ago that showed diverticulosis without diverticulitis. Patient reports pain is dull and crampy in nature. Patient has had similar problems before with constipation. Past Medical History  Diagnosis Date  . Hypertriglyceridemia     164 - 2/11  . Panic disorder 11/05  . Obesity     250 lbs 8/11  . Atypical chest pain     Negative cardiolite at [facility] cards 11/07  . Anemia, iron deficiency     Requried 2u prbc 6/09, menometrorrhagia, uterine fibroids.  . Hypothyroidism   . Insomnia   . Allergic rhinitis   . Elevated BP     Past Surgical History  Procedure Date  . Cholecystectomy 2000  . Other hysterectomy     Family History  Problem Relation Age of Onset  . Heart attack Mother   . Lung cancer Mother     metestatic, + smoker  . Aneurysm Mother   . Hyperlipidemia Mother   . Hypertension Mother   . Hypertension Father     History  Substance Use Topics  . Smoking status: Never Smoker   . Smokeless tobacco: Not on file  . Alcohol Use: No    OB History    Grav Para Term Preterm Abortions TAB SAB Ect Mult Living                  Review of Systems  Gastrointestinal: Positive for abdominal pain.  All  other systems reviewed and are negative.    Allergies  Doxycycline; Oxycodone; and Sulfamethoxazole w/trimethoprim  Home Medications   Current Outpatient Rx  Name Route Sig Dispense Refill  . ALBUTEROL 90 MCG/ACT IN AERS Inhalation Inhale 2 puffs into the lungs every 6 (six) hours as needed for wheezing. 17 g 12  . ALPRAZOLAM 0.5 MG PO TABS Oral Take 0.5 mg by mouth 3 (three) times daily as needed. Take one tablet by mouth every evening as needed for sleep    . IBUPROFEN 200 MG PO TABS Oral Take 200 mg by mouth every 6 (six) hours as needed.      Marland Kitchen LEVOTHYROXINE SODIUM 50 MCG PO TABS Oral Take 50 mcg by mouth daily.      Marland Kitchen PAROXETINE HCL 20 MG PO TABS Oral Take 1 tablet (20 mg total) by mouth daily. 30 tablet 2  . POLYETHYLENE GLYCOL 3350 PO POWD Oral Take 17 g by mouth daily. 255 g 0    BP 148/91  Pulse 93  Temp(Src) 97.9 F (36.6 C) (Oral)  Resp 18  SpO2 98%  LMP 06/17/2007  Physical Exam  Nursing note and vitals reviewed. Constitutional: She is oriented to person, place, and time. She appears well-developed and well-nourished.       Morbidly obese  HENT:  Head: Normocephalic  and atraumatic.  Nose: Nose normal.  Mouth/Throat: Oropharynx is clear and moist.  Eyes: Conjunctivae and EOM are normal. Pupils are equal, round, and reactive to light.  Neck: Normal range of motion. Neck supple. No JVD present. No tracheal deviation present. No thyromegaly present.  Cardiovascular: Normal rate, regular rhythm, normal heart sounds and intact distal pulses.  Exam reveals no gallop and no friction rub.   No murmur heard. Pulmonary/Chest: Effort normal and breath sounds normal. No stridor. No respiratory distress. She has no wheezes. She has no rales. She exhibits no tenderness.  Abdominal: Soft. Bowel sounds are normal. She exhibits no distension and no mass. There is tenderness (mild tenderness across lower abdomen). There is no rebound and no guarding.  Musculoskeletal: Normal range  of motion. She exhibits no edema and no tenderness.  Lymphadenopathy:    She has no cervical adenopathy.  Neurological: She is oriented to person, place, and time. She exhibits normal muscle tone. Coordination normal.  Skin: Skin is dry. No rash noted. No erythema. No pallor.  Psychiatric: She has a normal mood and affect. Her behavior is normal. Judgment and thought content normal.    ED Course  Procedures (including critical care time)  Labs Reviewed  CBC - Abnormal; Notable for the following:    WBC 13.6 (*)    All other components within normal limits  DIFFERENTIAL - Abnormal; Notable for the following:    Neutrophils Relative 80 (*)    Neutro Abs 10.9 (*)    All other components within normal limits  COMPREHENSIVE METABOLIC PANEL - Abnormal; Notable for the following:    Glucose, Bld 102 (*)    Total Protein 8.4 (*)    Alkaline Phosphatase 119 (*)    GFR calc non Af Amer 62 (*)    GFR calc Af Amer 72 (*)    All other components within normal limits  URINALYSIS, ROUTINE W REFLEX MICROSCOPIC - Abnormal; Notable for the following:    Protein, ur >300 (*)    All other components within normal limits  URINE MICROSCOPIC-ADD ON - Abnormal; Notable for the following:    Squamous Epithelial / LPF FEW (*)    Bacteria, UA FEW (*)    All other components within normal limits  LIPASE, BLOOD   Dg Abd Acute W/chest  09/17/2011  *RADIOLOGY REPORT*  Clinical Data: Low abdominal pain with abdominal distension and constipation.  ACUTE ABDOMEN SERIES (ABDOMEN 2 VIEW & CHEST 1 VIEW)  Comparison: Chest radiographs 08/03 02/12, acute abdominal series 10/02/2010 and abdominal CT 08/23/2011.  Findings: The heart size and mediastinal contours are stable.  The lungs are clear.  There is no pleural effusion or pneumothorax.  The bowel gas pattern remains normal.  There is no free intraperitoneal air.  Cholecystectomy clips and a right pelvic phlebolith are unchanged.  There are no suspicious osseous  findings.  IMPRESSION: Stable examination.  No acute cardiopulmonary or abdominal process.  Original Report Authenticated By: Gerrianne Scale, M.D.     1. Abdominal pain   2. Constipation       MDM  50 year old female with lower abdominal pain and constipation. Patient with mildly elevated white blood cell count. Patient reevaluated and pain is better after having a small bowel movement. Discuss with patient her elevated wbc count. At this time we have decided to hold on repeating CAT scan as she just had one 3 weeks ago. Patient to return to the emergency department for worsening pain fever vomiting or other  concerns        Olivia Mackie, MD 09/17/11 [phone]

## 2011-11-13 ENCOUNTER — Encounter (HOSPITAL_COMMUNITY): Payer: Self-pay | Admitting: Emergency Medicine

## 2011-11-13 ENCOUNTER — Inpatient Hospital Stay (HOSPITAL_COMMUNITY)
Admission: EM | Admit: 2011-11-13 | Discharge: 2011-11-17 | DRG: 348 | Disposition: A | Discharge status: Home / Self Care | Payer: Self-pay | Attending: General Surgery | Admitting: General Surgery

## 2011-11-13 DIAGNOSIS — E039 Hypothyroidism, unspecified: Secondary | ICD-10-CM | POA: Diagnosis present

## 2011-11-13 DIAGNOSIS — A498 Other bacterial infections of unspecified site: Secondary | ICD-10-CM | POA: Diagnosis present

## 2011-11-13 DIAGNOSIS — F410 Panic disorder [episodic paroxysmal anxiety] without agoraphobia: Secondary | ICD-10-CM | POA: Diagnosis present

## 2011-11-13 DIAGNOSIS — T40605A Adverse effect of unspecified narcotics, initial encounter: Secondary | ICD-10-CM | POA: Diagnosis not present

## 2011-11-13 DIAGNOSIS — J309 Allergic rhinitis, unspecified: Secondary | ICD-10-CM | POA: Diagnosis present

## 2011-11-13 DIAGNOSIS — K612 Anorectal abscess: Secondary | ICD-10-CM

## 2011-11-13 DIAGNOSIS — Z79899 Other long term (current) drug therapy: Secondary | ICD-10-CM

## 2011-11-13 DIAGNOSIS — Z888 Allergy status to other drugs, medicaments and biological substances status: Secondary | ICD-10-CM

## 2011-11-13 DIAGNOSIS — Z6841 Body Mass Index (BMI) 40.0 and over, adult: Secondary | ICD-10-CM

## 2011-11-13 DIAGNOSIS — Y9210 Unspecified residential institution as the place of occurrence of the external cause: Secondary | ICD-10-CM | POA: Diagnosis not present

## 2011-11-13 DIAGNOSIS — L299 Pruritus, unspecified: Secondary | ICD-10-CM | POA: Diagnosis not present

## 2011-11-13 DIAGNOSIS — E781 Pure hyperglyceridemia: Secondary | ICD-10-CM | POA: Diagnosis present

## 2011-11-13 DIAGNOSIS — K611 Rectal abscess: Secondary | ICD-10-CM | POA: Diagnosis present

## 2011-11-13 NOTE — ED Notes (Signed)
Pt c/o rectal pain for approx 1 week.  Pt st's she had a rectal fissure approx 5 yrs ago st's feels the same

## 2011-11-14 ENCOUNTER — Encounter (HOSPITAL_COMMUNITY): Admission: EM | Disposition: A | Discharge status: Home / Self Care | Payer: Self-pay | Source: Home / Self Care

## 2011-11-14 ENCOUNTER — Emergency Department (HOSPITAL_COMMUNITY): Payer: Self-pay

## 2011-11-14 ENCOUNTER — Encounter (HOSPITAL_COMMUNITY): Payer: Self-pay

## 2011-11-14 ENCOUNTER — Encounter (HOSPITAL_COMMUNITY): Payer: Self-pay | Admitting: Critical Care Medicine

## 2011-11-14 ENCOUNTER — Observation Stay (HOSPITAL_COMMUNITY): Payer: Self-pay | Admitting: Critical Care Medicine

## 2011-11-14 DIAGNOSIS — K611 Rectal abscess: Secondary | ICD-10-CM

## 2011-11-14 HISTORY — PX: INCISION AND DRAINAGE PERIRECTAL ABSCESS: SHX1804

## 2011-11-14 HISTORY — DX: Rectal abscess: K61.1

## 2011-11-14 LAB — POCT I-STAT, CHEM 8
HCT: 39 % (ref 36.0–46.0)
Hemoglobin: 13.3 g/dL (ref 12.0–15.0)
Potassium: 3.9 mEq/L (ref 3.5–5.1)
Sodium: 141 mEq/L (ref 135–145)

## 2011-11-14 LAB — DIFFERENTIAL
Basophils Relative: 0 % (ref 0–1)
Eosinophils Absolute: 0.2 10*3/uL (ref 0.0–0.7)
Lymphs Abs: 1.7 10*3/uL (ref 0.7–4.0)
Neutrophils Relative %: 81 % — ABNORMAL HIGH (ref 43–77)

## 2011-11-14 LAB — CBC
MCH: 30.5 pg (ref 26.0–34.0)
Platelets: 256 10*3/uL (ref 150–400)
RBC: 4.29 MIL/uL (ref 3.87–5.11)

## 2011-11-14 LAB — SURGICAL PCR SCREEN
MRSA, PCR: POSITIVE — AB
Staphylococcus aureus: POSITIVE — AB

## 2011-11-14 SURGERY — INCISION AND DRAINAGE, ABSCESS, PERIRECTAL
Anesthesia: General | Site: Perineum | Wound class: Dirty or Infected

## 2011-11-14 MED ORDER — DEXTROSE 5 % IV SOLN
2.0000 g | INTRAVENOUS | Status: AC
  Administered 2011-11-14: 2 g via INTRAVENOUS
  Filled 2011-11-14: qty 2

## 2011-11-14 MED ORDER — HYDROCODONE-ACETAMINOPHEN 5-325 MG PO TABS
2.0000 | ORAL_TABLET | ORAL | Status: DC | PRN
  Administered 2011-11-14 – 2011-11-15 (×2): 2 via ORAL
  Filled 2011-11-14 (×2): qty 2

## 2011-11-14 MED ORDER — ONDANSETRON HCL 4 MG/2ML IJ SOLN
4.0000 mg | Freq: Four times a day (QID) | INTRAMUSCULAR | Status: DC | PRN
  Administered 2011-11-14 (×3): 4 mg via INTRAVENOUS
  Filled 2011-11-14 (×2): qty 2

## 2011-11-14 MED ORDER — IOHEXOL 300 MG/ML  SOLN
20.0000 mL | INTRAMUSCULAR | Status: DC
  Administered 2011-11-14: 20 mL via ORAL

## 2011-11-14 MED ORDER — PROMETHAZINE HCL 25 MG/ML IJ SOLN: 6.2500 mg | INTRAMUSCULAR | Status: DC | PRN

## 2011-11-14 MED ORDER — SODIUM CHLORIDE 0.9 % IV SOLN
Freq: Once | INTRAVENOUS | Status: AC
  Administered 2011-11-14: 100 mL via INTRAVENOUS

## 2011-11-14 MED ORDER — IOHEXOL 300 MG/ML  SOLN
100.0000 mL | Freq: Once | INTRAMUSCULAR | Status: AC | PRN
  Administered 2011-11-14: 100 mL via INTRAVENOUS

## 2011-11-14 MED ORDER — TRAMADOL HCL 50 MG PO TABS
50.0000 mg | ORAL_TABLET | Freq: Four times a day (QID) | ORAL | Status: DC | PRN
  Administered 2011-11-15 – 2011-11-16 (×2): 50 mg via ORAL
  Filled 2011-11-14 (×2): qty 1

## 2011-11-14 MED ORDER — SUCCINYLCHOLINE CHLORIDE 20 MG/ML IJ SOLN
INTRAMUSCULAR | Status: DC | PRN
  Administered 2011-11-14: 160 mg via INTRAVENOUS

## 2011-11-14 MED ORDER — PANTOPRAZOLE SODIUM 40 MG IV SOLR
40.0000 mg | Freq: Every day | INTRAVENOUS | Status: DC
  Administered 2011-11-14 – 2011-11-15 (×2): 40 mg via INTRAVENOUS
  Filled 2011-11-14 (×3): qty 40

## 2011-11-14 MED ORDER — PAROXETINE HCL 20 MG PO TABS
20.0000 mg | ORAL_TABLET | Freq: Every day | ORAL | Status: DC
  Administered 2011-11-15 – 2011-11-17 (×3): 20 mg via ORAL
  Filled 2011-11-14 (×4): qty 1

## 2011-11-14 MED ORDER — ONDANSETRON HCL 4 MG/2ML IJ SOLN
INTRAMUSCULAR | Status: DC | PRN
  Administered 2011-11-14: 4 mg via INTRAVENOUS

## 2011-11-14 MED ORDER — DEXTROSE 5 % IV SOLN
2.0000 g | Freq: Four times a day (QID) | INTRAVENOUS | Status: DC
  Administered 2011-11-14 – 2011-11-17 (×11): 2 g via INTRAVENOUS
  Filled 2011-11-14 (×16): qty 2

## 2011-11-14 MED ORDER — PROPOFOL 10 MG/ML IV EMUL
INTRAVENOUS | Status: DC | PRN
  Administered 2011-11-14: 200 mg via INTRAVENOUS

## 2011-11-14 MED ORDER — FENTANYL CITRATE 0.05 MG/ML IJ SOLN
INTRAMUSCULAR | Status: DC | PRN
  Administered 2011-11-14: 100 ug via INTRAVENOUS

## 2011-11-14 MED ORDER — LEVOTHYROXINE SODIUM 50 MCG PO TABS
50.0000 ug | ORAL_TABLET | Freq: Every day | ORAL | Status: DC
  Administered 2011-11-15 – 2011-11-17 (×3): 50 ug via ORAL
  Filled 2011-11-14 (×4): qty 1

## 2011-11-14 MED ORDER — MIDAZOLAM HCL 5 MG/5ML IJ SOLN
INTRAMUSCULAR | Status: DC | PRN
  Administered 2011-11-14: 2 mg via INTRAVENOUS

## 2011-11-14 MED ORDER — DEXAMETHASONE SODIUM PHOSPHATE 4 MG/ML IJ SOLN
INTRAMUSCULAR | Status: DC | PRN
  Administered 2011-11-14: 4 mg via INTRAVENOUS

## 2011-11-14 MED ORDER — LACTATED RINGERS IV SOLN
INTRAVENOUS | Status: DC | PRN
  Administered 2011-11-14: 12:00:00 via INTRAVENOUS

## 2011-11-14 MED ORDER — LACTATED RINGERS IV SOLN: INTRAVENOUS | Status: DC

## 2011-11-14 MED ORDER — MORPHINE SULFATE 4 MG/ML IJ SOLN
4.0000 mg | INTRAMUSCULAR | Status: DC | PRN
  Administered 2011-11-14 – 2011-11-17 (×6): 4 mg via INTRAVENOUS
  Filled 2011-11-14 (×7): qty 1

## 2011-11-14 MED ORDER — HYDROMORPHONE HCL PF 1 MG/ML IJ SOLN
0.5000 mg | Freq: Once | INTRAMUSCULAR | Status: AC
  Administered 2011-11-14: 0.5 mg via INTRAVENOUS
  Filled 2011-11-14: qty 1

## 2011-11-14 MED ORDER — KCL IN DEXTROSE-NACL 20-5-0.9 MEQ/L-%-% IV SOLN
INTRAVENOUS | Status: DC
  Administered 2011-11-14 – 2011-11-15 (×4): via INTRAVENOUS
  Administered 2011-11-16: 100 mL/h via INTRAVENOUS
  Administered 2011-11-16: 21:00:00 via INTRAVENOUS
  Administered 2011-11-17: 100 mL/h via INTRAVENOUS
  Filled 2011-11-14 (×13): qty 1000

## 2011-11-14 MED ORDER — HYDROMORPHONE HCL PF 1 MG/ML IJ SOLN: 0.2500 mg | INTRAMUSCULAR | Status: DC | PRN

## 2011-11-14 SURGICAL SUPPLY — 32 items
BLADE SURG 15 STRL LF DISP TIS (BLADE) ×1 IMPLANT
BLADE SURG 15 STRL SS (BLADE) ×1
CANISTER SUCTION 2500CC (MISCELLANEOUS) ×2 IMPLANT
CLEANER TIP ELECTROSURG 2X2 (MISCELLANEOUS) IMPLANT
CLOTH BEACON ORANGE TIMEOUT ST (SAFETY) ×2 IMPLANT
COVER SURGICAL LIGHT HANDLE (MISCELLANEOUS) ×2 IMPLANT
DRAPE UTILITY 15X26 W/TAPE STR (DRAPE) ×4 IMPLANT
DRSG PAD ABDOMINAL 8X10 ST (GAUZE/BANDAGES/DRESSINGS) ×2 IMPLANT
ELECT REM PT RETURN 9FT ADLT (ELECTROSURGICAL)
ELECTRODE REM PT RTRN 9FT ADLT (ELECTROSURGICAL) IMPLANT
GAUZE PACKING IODOFORM 1 (PACKING) IMPLANT
GAUZE SPONGE 4X4 16PLY XRAY LF (GAUZE/BANDAGES/DRESSINGS) ×2 IMPLANT
GLOVE BIO SURGEON STRL SZ8 (GLOVE) ×2 IMPLANT
GLOVE BIOGEL PI IND STRL 8 (GLOVE) ×1 IMPLANT
GLOVE BIOGEL PI INDICATOR 8 (GLOVE) ×1
GOWN PREVENTION PLUS XLARGE (GOWN DISPOSABLE) ×2 IMPLANT
GOWN STRL NON-REIN LRG LVL3 (GOWN DISPOSABLE) ×4 IMPLANT
KIT BASIN OR (CUSTOM PROCEDURE TRAY) ×2 IMPLANT
KIT ROOM TURNOVER OR (KITS) ×2 IMPLANT
NS IRRIG 1000ML POUR BTL (IV SOLUTION) ×2 IMPLANT
PACK LITHOTOMY IV (CUSTOM PROCEDURE TRAY) ×2 IMPLANT
PAD ARMBOARD 7.5X6 YLW CONV (MISCELLANEOUS) ×4 IMPLANT
PENCIL BUTTON HOLSTER BLD 10FT (ELECTRODE) IMPLANT
SPONGE GAUZE 4X4 12PLY (GAUZE/BANDAGES/DRESSINGS) ×2 IMPLANT
SWAB COLLECTION DEVICE MRSA (MISCELLANEOUS) ×2 IMPLANT
TOWEL OR 17X24 6PK STRL BLUE (TOWEL DISPOSABLE) ×2 IMPLANT
TOWEL OR 17X26 10 PK STRL BLUE (TOWEL DISPOSABLE) ×2 IMPLANT
TUBE ANAEROBIC SPECIMEN COL (MISCELLANEOUS) ×2 IMPLANT
TUBE CONNECTING 12X1/4 (SUCTIONS) ×2 IMPLANT
UNDERPAD 30X30 INCONTINENT (UNDERPADS AND DIAPERS) ×2 IMPLANT
WATER STERILE IRR 1000ML POUR (IV SOLUTION) ×2 IMPLANT
YANKAUER SUCT BULB TIP NO VENT (SUCTIONS) ×2 IMPLANT

## 2011-11-14 NOTE — ED Notes (Signed)
Report received, assumed care.  

## 2011-11-14 NOTE — Op Note (Signed)
11/13/2011 - 11/14/2011  1:31 PM  PATIENT:  [name]  51 y.o. female  PRE-OPERATIVE DIAGNOSIS:  perirectal abscess  POST-OPERATIVE DIAGNOSIS:  perirectal abscess  PROCEDURE:  Procedure(s): INCISION AND DRAINAGE L  PERIRECTAL ABSCESS  SURGEON:  Surgeon(s): Liz Malady, MD  PHYSICIAN ASSISTANT:   ASSISTANTS: none   ANESTHESIA:   general  EBL:  Total I/O In: 948.3 [I.V.:948.3] Out: 325 [Urine:225; Blood:100]  BLOOD ADMINISTERED:none  DRAINS: none   SPECIMEN:  No Specimen  DISPOSITION OF SPECIMEN:  N/A  COUNTS:  YES  DICTATION: .Dragon Dictation Patient was identified in the pre-op holding area.  Consent had been obtained.  Patient is on IV antibiotics.  She was brought to the OR.  General anesthesia was administered by the anesthesia staff. She was placed in lithotomy position.  We did a time-out procedure.  Perirectal area was prepped and draped in a sterile fashion.  Fluctuant area was easily palpable in the L perirectal area.  This was incised with release of pus.  This was sent for culture.  It tracked anteriorly and this was opened up.  Wound was irrigated.  Hemostasis was obtained with cautery.  Wound was packed with 1in iodoform followed by sterile gauze.  No complications.  All counts were correct.  Patient was taken to PACU in stable condition.  PATIENT DISPOSITION:  PACU - hemodynamically stable.   Delay start of Pharmacological VTE agent (>24hrs) due to surgical blood loss or risk of bleeding:  no  Violeta Gelinas, MD, MPH, FACS Pager: [phone]  2/2/20131:31 PM

## 2011-11-14 NOTE — Progress Notes (Signed)
Patient ID: [name], female   DOB: [birth year], 51 y.o.   MRN: [id number] Patient seen and I discussed with Dr. Carolynne Edouard.  Plan I&D perirectal abscess in the OR.  Consent obtained.  Patient agreeable. Violeta Gelinas, MD, MPH, FACS Pager: [phone]

## 2011-11-14 NOTE — ED Notes (Signed)
Consent obtained for surgery by husband due to pt receiving pain med earlier.

## 2011-11-14 NOTE — ED Notes (Signed)
The pt says she thinks she has an anal fissure.  She had one 5 years ago..  She has had rectal pain for one week

## 2011-11-14 NOTE — Preoperative (Signed)
Beta Blockers   Reason not to administer Beta Blockers:Not Applicable 

## 2011-11-14 NOTE — Anesthesia Preprocedure Evaluation (Addendum)
Anesthesia Evaluation  Patient identified by MRN, date of birth, ID band Patient awake    Reviewed: Allergy & Precautions, H&P , NPO status , Patient's Chart, lab work & pertinent test results  History of Anesthesia Complications Negative for: history of anesthetic complications  Airway Mallampati: III TM Distance: >3 FB Neck ROM: Full    Dental  (+) Edentulous Upper and Edentulous Lower   Pulmonary shortness of breath and with exertion,  clear to auscultation  Pulmonary exam normal       Cardiovascular neg cardio ROS Regular Normal Stress test/ECHO '11  Normal LVF    Neuro/Psych PSYCHIATRIC DISORDERS Anxiety Depression    GI/Hepatic negative GI ROS, Neg liver ROS,   Endo/Other  Hypothyroidism (treated) Morbid obesity  Renal/GU negative Renal ROS     Musculoskeletal   Abdominal (+) obese,   Peds  Hematology   Anesthesia Other Findings   Reproductive/Obstetrics                          Anesthesia Physical Anesthesia Plan  ASA: III  Anesthesia Plan: General   Post-op Pain Management:    Induction: Intravenous  Airway Management Planned: Oral ETT  Additional Equipment:   Intra-op Plan:   Post-operative Plan: Extubation in OR  Informed Consent: I have reviewed the patients History and Physical, chart, labs and discussed the procedure including the risks, benefits and alternatives for the proposed anesthesia with the patient or authorized representative who has indicated his/her understanding and acceptance.   Dental advisory given  Plan Discussed with: CRNA, Anesthesiologist and Surgeon  Anesthesia Plan Comments: (Plan routine monitors, GETA)       Anesthesia Quick Evaluation

## 2011-11-14 NOTE — H&P (Signed)
[name] is an 51 y.o. female.   Chief Complaint: rectal pain HPI: 51yo wf who is morbidly obese presents with rectal pain for the last week. She says it feels like she is sitting on a ball. Denies fever. She has had some diarrhea. Pain is not getting any better. She has had one other similar episode in the last 5 years  Past Medical History  Diagnosis Date  . Hypertriglyceridemia     164 - 2/11  . Panic disorder 11/05  . Obesity     250 lbs 8/11  . Atypical chest pain     Negative cardiolite at [facility] cards 11/07  . Anemia, iron deficiency     Requried 2u prbc 6/09, menometrorrhagia, uterine fibroids.  . Hypothyroidism   . Insomnia   . Allergic rhinitis   . Elevated BP     Past Surgical History  Procedure Date  . Cholecystectomy 2000  . Other hysterectomy     Family History  Problem Relation Age of Onset  . Heart attack Mother   . Lung cancer Mother     metestatic, + smoker  . Aneurysm Mother   . Hyperlipidemia Mother   . Hypertension Mother   . Hypertension Father    Social History:  reports that she has never smoked. She does not have any smokeless tobacco history on file. She reports that she does not drink alcohol or use illicit drugs.  Allergies:  Allergies  Allergen Reactions  . Doxycycline Nausea And Vomiting  . Oxycodone Itching    itching  . Sulfamethoxazole W/Trimethoprim Nausea And Vomiting    Medications Prior to Admission  Medication Dose Route Frequency Provider Last Rate Last Dose  . 0.9 %  sodium chloride infusion   Intravenous Once Arman Filter, NP 100 mL/hr at 11/14/11 0212 100 mL at 11/14/11 9604  . HYDROmorphone (DILAUDID) injection 0.5 mg  0.5 mg Intravenous Once Arman Filter, NP   0.5 mg at 11/14/11 0541  . iohexol (OMNIPAQUE) 300 MG/ML solution 100 mL  100 mL Intravenous Once PRN Medication Radiologist, MD   100 mL at 11/14/11 0426  . iohexol (OMNIPAQUE) 300 MG/ML solution 20 mL  20 mL Oral Q1 Hr x 2 Ethelda Chick, MD   20 mL at  11/14/11 0156   Medications Prior to Admission  Medication Sig Dispense Refill  . levothyroxine (SYNTHROID, LEVOTHROID) 50 MCG tablet Take 50 mcg by mouth daily.        Marland Kitchen PARoxetine (PAXIL) 20 MG tablet Take 1 tablet (20 mg total) by mouth daily.  30 tablet  2    Results for orders placed during the hospital encounter of 11/13/11 (from the past 48 hour(s))  CBC     Status: Abnormal   Collection Time   11/14/11  1:30 AM      Component Value Range Comment   WBC 13.3 (*) 4.0 - 10.5 (K/uL)    RBC 4.29  3.87 - 5.11 (MIL/uL)    Hemoglobin 13.1  12.0 - 15.0 (g/dL)    HCT 54.0  98.1 - 19.1 (%)    MCV 88.8  78.0 - 100.0 (fL)    MCH 30.5  26.0 - 34.0 (pg)    MCHC 34.4  30.0 - 36.0 (g/dL)    RDW 47.8  29.5 - 62.1 (%)    Platelets 256  150 - 400 (K/uL)   DIFFERENTIAL     Status: Abnormal   Collection Time   11/14/11  1:30 AM  Component Value Range Comment   Neutrophils Relative 81 (*) 43 - 77 (%)    Neutro Abs 10.7 (*) 1.7 - 7.7 (K/uL)    Lymphocytes Relative 13  12 - 46 (%)    Lymphs Abs 1.7  0.7 - 4.0 (K/uL)    Monocytes Relative 5  3 - 12 (%)    Monocytes Absolute 0.7  0.1 - 1.0 (K/uL)    Eosinophils Relative 1  0 - 5 (%)    Eosinophils Absolute 0.2  0.0 - 0.7 (K/uL)    Basophils Relative 0  0 - 1 (%)    Basophils Absolute 0.0  0.0 - 0.1 (K/uL)   POCT I-STAT, CHEM 8     Status: Abnormal   Collection Time   11/14/11  1:46 AM      Component Value Range Comment   Sodium 141  135 - 145 (mEq/L)    Potassium 3.9  3.5 - 5.1 (mEq/L)    Chloride 108  96 - 112 (mEq/L)    BUN 14  6 - 23 (mg/dL)    Creatinine, Ser 2.95  0.50 - 1.10 (mg/dL)    Glucose, Bld 621 (*) 70 - 99 (mg/dL)    Calcium, Ion 3.08  1.12 - 1.32 (mmol/L)    TCO2 23  0 - 100 (mmol/L)    Hemoglobin 13.3  12.0 - 15.0 (g/dL)    HCT 65.7  84.6 - 96.2 (%)    Ct Pelvis W Contrast  11/14/2011  *RADIOLOGY REPORT*  Clinical Data:  Rectal pain, evaluate for abscess  CT PELVIS WITH CONTRAST  Technique:  Multidetector CT imaging of  the pelvis was performed using the standard protocol following the bolus administration of intravenous contrast.  Contrast: OMNIPAQUE IOHEXOL 300 MG/ML IV SOLN  Comparison:  08/23/2011  Findings:  Intraperitoneal contents within normal limits.  No bowel obstruction.  The rectum is normal in appearance. Within the left posterior subcutaneous fat adjacent to the gluteal cleft is a 3.6 x 3.0 cm peripherally enhancing fluid collection.  The collection extends via a tract superiorly the left perianal/ischioanal fat. No bowel contrast appreciated within the tract or abscess.  IMPRESSION: Abscess within the left posterior subcutaneous fat.  There does appear to be a thin tract which extends superiorly toward the left perianal fat. However, no rectal contrast communicates with the tract or abscess.  Original Report Authenticated By: Waneta Martins, M.D.    Review of Systems  Constitutional: Negative.   HENT: Negative.   Eyes: Negative.   Respiratory: Negative.   Cardiovascular: Negative.   Gastrointestinal: Negative.   Genitourinary: Negative.   Musculoskeletal: Negative.   Skin: Negative.   Neurological: Negative.   Endo/Heme/Allergies: Negative.   Psychiatric/Behavioral: Negative.     Blood pressure 142/84, pulse 79, temperature 97.8 F (36.6 C), temperature source Oral, resp. rate 20, last menstrual period 06/17/2007, SpO2 96.00%. Physical Exam  Constitutional: She is oriented to person, place, and time.       Morbidly obese  HENT:  Head: Normocephalic and atraumatic.  Eyes: Conjunctivae and EOM are normal. Pupils are equal, round, and reactive to light.  Neck: Normal range of motion. Neck supple.  Cardiovascular: Normal rate, regular rhythm and normal heart sounds.   Respiratory: Effort normal and breath sounds normal.  GI: Soft. Bowel sounds are normal.  Genitourinary:       3-4 cm painful lump in left perirectal space. Not much overlying cellullitis  Musculoskeletal: Normal  range of motion.  Neurological: She is  alert and oriented to person, place, and time.  Skin: Skin is warm and dry.  Psychiatric: She has a normal mood and affect. Her behavior is normal.     Assessment/Plan 51 yo morbidly obese female with panic disorder and a perirectal abscess. She would probably benefit from having this drained in the or. i have discussed with her the risks and benefits of surgery including some of the technical aspects and she understands and wishes to proceed.  TOTH III,Yocelin Vanlue S 11/14/2011, 6:05 AM

## 2011-11-14 NOTE — ED Provider Notes (Signed)
History     CSN: [id number]  Arrival date & time 11/13/11  2326   First MD Initiated Contact with Patient 11/14/11 0055      Chief Complaint  Patient presents with  . Rectal Pain    (Consider location/radiation/quality/duration/timing/severity/associated sxs/prior treatment) HPI Comments: Patient has had 3-4 days of rectal pain.  She is able to have bowel movements without difficulty, but it is painful.  She has a history of a perirectal abscess for 5 years ago.  That had to be I&D  The history is provided by the patient.    Past Medical History  Diagnosis Date  . Hypertriglyceridemia     164 - 2/11  . Panic disorder 11/05  . Obesity     250 lbs 8/11  . Atypical chest pain     Negative cardiolite at [facility] cards 11/07  . Anemia, iron deficiency     Requried 2u prbc 6/09, menometrorrhagia, uterine fibroids.  . Hypothyroidism   . Insomnia   . Allergic rhinitis   . Elevated BP     Past Surgical History  Procedure Date  . Cholecystectomy 2000  . Other hysterectomy     Family History  Problem Relation Age of Onset  . Heart attack Mother   . Lung cancer Mother     metestatic, + smoker  . Aneurysm Mother   . Hyperlipidemia Mother   . Hypertension Mother   . Hypertension Father     History  Substance Use Topics  . Smoking status: Never Smoker   . Smokeless tobacco: Not on file  . Alcohol Use: No    OB History    Grav Para Term Preterm Abortions TAB SAB Ect Mult Living                  Review of Systems  Constitutional: Negative for fatigue.  Gastrointestinal: Positive for rectal pain. Negative for diarrhea and constipation.  Skin: Negative for color change.    Allergies  Doxycycline; Oxycodone; and Sulfamethoxazole w/trimethoprim  Home Medications   Current Outpatient Rx  Name Route Sig Dispense Refill  . ACETAMINOPHEN 500 MG PO TABS Oral Take 1,000 mg by mouth every 4 (four) hours as needed. Every 4-6 hours as needed for pain.    Marland Kitchen  LEVOTHYROXINE SODIUM 50 MCG PO TABS Oral Take 50 mcg by mouth daily.      Marland Kitchen PAROXETINE HCL 20 MG PO TABS Oral Take 1 tablet (20 mg total) by mouth daily. 30 tablet 2    BP 142/84  Pulse 79  Temp(Src) 97.8 F (36.6 C) (Oral)  Resp 20  SpO2 96%  LMP 06/17/2007  Physical Exam  Constitutional: She is oriented to person, place, and time. She appears well-developed and well-nourished.  HENT:  Head: Normocephalic.  Cardiovascular: Normal rate.   Abdominal: Soft.  Genitourinary: Rectal exam shows mass and tenderness. Rectal exam shows no external hemorrhoid, no internal hemorrhoid, no fissure and anal tone normal.  Neurological: She is alert and oriented to person, place, and time.  Skin: Skin is warm and dry.    ED Course  Procedures (including critical care time)  Labs Reviewed  CBC - Abnormal; Notable for the following:    WBC 13.3 (*)    All other components within normal limits  DIFFERENTIAL - Abnormal; Notable for the following:    Neutrophils Relative 81 (*)    Neutro Abs 10.7 (*)    All other components within normal limits  POCT I-STAT, CHEM 8 - Abnormal;  Notable for the following:    Glucose, Bld 127 (*)    All other components within normal limits   Ct Pelvis W Contrast  11/14/2011  *RADIOLOGY REPORT*  Clinical Data:  Rectal pain, evaluate for abscess  CT PELVIS WITH CONTRAST  Technique:  Multidetector CT imaging of the pelvis was performed using the standard protocol following the bolus administration of intravenous contrast.  Contrast: OMNIPAQUE IOHEXOL 300 MG/ML IV SOLN  Comparison:  08/23/2011  Findings:  Intraperitoneal contents within normal limits.  No bowel obstruction.  The rectum is normal in appearance. Within the left posterior subcutaneous fat adjacent to the gluteal cleft is a 3.6 x 3.0 cm peripherally enhancing fluid collection.  The collection extends via a tract superiorly the left perianal/ischioanal fat. No bowel contrast appreciated within the tract  or abscess.  IMPRESSION: Abscess within the left posterior subcutaneous fat.  There does appear to be a thin tract which extends superiorly toward the left perianal fat. However, no rectal contrast communicates with the tract or abscess.  Original Report Authenticated By: Waneta Martins, M.D.     1. Perirectal abscess       MDM  Rectal abscess.  Will CT pelvis with contrast to see extent of the abscess Dr. Carolynne Edouard in to evaluate patient and agrees to admit.  He will be taking the patient to the OR for incision and drainage        Arman Filter, NP 11/14/11 0244  Arman Filter, NP 11/14/11 1308  Arman Filter, NP 11/14/11 [phone]

## 2011-11-14 NOTE — Anesthesia Procedure Notes (Addendum)
Performed by: Elon Alas   Procedure Name: Intubation Date/Time: 11/14/2011 12:38 PM Performed by: Elon Alas Pre-anesthesia Checklist: Emergency Drugs available, Patient identified, Timeout performed, Suction available and Patient being monitored Patient Re-evaluated:Patient Re-evaluated prior to inductionOxygen Delivery Method: Circle System Utilized Intubation Type: IV induction and Cricoid Pressure applied Laryngoscope Size: Mac and 3 Grade View: Grade I Tube type: Oral Tube size: 7.0 mm Number of attempts: 1 Airway Equipment and Method: stylet and patient positioned with wedge pillow Placement Confirmation: ETT inserted through vocal cords under direct vision,  positive ETCO2 and breath sounds checked- equal and bilateral Secured at: 21 cm Tube secured with: Tape Dental Injury: Teeth and Oropharynx as per pre-operative assessment and Dental damage

## 2011-11-14 NOTE — ED Provider Notes (Signed)
Medical screening examination/treatment/procedure(s) were conducted as a shared visit with non-physician practitioner(s) and myself.  I personally evaluated the patient during the encounter  Pt seen and examined, large fluctuant abscess on left buttock with extension to the rectal area.  Rectal exam reveals tenderness at inferior margin of rectum.  Consult called to Dr. Carolynne Edouard, surgery who has seen the patient, he plans for OR drainage and admission.    Ethelda Chick, MD 11/14/11 [phone]

## 2011-11-14 NOTE — ED Notes (Signed)
Surgeon in to speak with pt at this time.

## 2011-11-14 NOTE — ED Notes (Signed)
#   20 angiocath inserted in the lt a-c  .  angiocath threaded 3/4 way in then unable to finish.  Iv running wekk.

## 2011-11-14 NOTE — ED Notes (Signed)
Pt c/o diarrhea, states pain in rectum since last Friday, feels like "I'm sitting on a rock".

## 2011-11-14 NOTE — Anesthesia Postprocedure Evaluation (Signed)
  Anesthesia Post-op Note  Patient: [name]  Procedure(s) Performed:  IRRIGATION AND DEBRIDEMENT PERIRECTAL ABSCESS  Patient Location: PACU  Anesthesia Type: General  Level of Consciousness: awake, alert  and oriented  Airway and Oxygen Therapy: Patient Spontanous Breathing  Post-op Pain: none  Post-op Assessment: Post-op Vital signs reviewed, Patient's Cardiovascular Status Stable, Respiratory Function Stable, Patent Airway, No signs of Nausea or vomiting and Pain level controlled  Post-op Vital Signs: Reviewed and stable  Complications: No apparent anesthesia complications

## 2011-11-14 NOTE — Transfer of Care (Signed)
Immediate Anesthesia Transfer of Care Note  Patient: [name]  Procedure(s) Performed:  IRRIGATION AND DEBRIDEMENT PERIRECTAL ABSCESS  Patient Location: PACU  Anesthesia Type: General  Level of Consciousness: awake, alert  and oriented  Airway & Oxygen Therapy: Patient Spontanous Breathing and Patient connected to nasal cannula oxygen  Post-op Assessment: Report given to PACU RN, Post -op Vital signs reviewed and stable and Patient moving all extremities X 4  Post vital signs: Reviewed and stable  Complications: No apparent anesthesia complications

## 2011-11-15 MED ORDER — DIPHENHYDRAMINE HCL 50 MG/ML IJ SOLN
25.0000 mg | Freq: Four times a day (QID) | INTRAMUSCULAR | Status: DC | PRN
  Administered 2011-11-15: 25 mg via INTRAVENOUS

## 2011-11-15 MED ORDER — CHLORHEXIDINE GLUCONATE CLOTH 2 % EX PADS
6.0000 | MEDICATED_PAD | Freq: Every day | CUTANEOUS | Status: DC
  Administered 2011-11-15 – 2011-11-17 (×3): 6 via TOPICAL

## 2011-11-15 MED ORDER — MUPIROCIN 2 % EX OINT
1.0000 "application " | TOPICAL_OINTMENT | Freq: Two times a day (BID) | CUTANEOUS | Status: DC
  Administered 2011-11-15 – 2011-11-17 (×5): 1 via NASAL
  Filled 2011-11-15: qty 22

## 2011-11-15 MED ORDER — DIPHENHYDRAMINE HCL 50 MG/ML IJ SOLN
INTRAMUSCULAR | Status: AC
  Filled 2011-11-15: qty 1

## 2011-11-15 NOTE — Progress Notes (Signed)
Patient ID: [name], female   DOB: Feb 16, 1961, 51 y.o.   MRN: [id number] 1 Day Post-Op  Subjective: Feels some better. Had severe itching with Vicodin and has had itching with oxycodone as well  Objective: Vital signs in last 24 hours: Temp:  [97.6 F (36.4 C)-98.3 F (36.8 C)] 97.6 F (36.4 C) (02/03 0622) Pulse Rate:  [61-92] 61  (02/03 0622) Resp:  [9-22] 18  (02/03 0622) BP: (107-140)/(53-85) 107/53 mmHg (02/03 0622) SpO2:  [94 %-100 %] 98 % (02/03 0622)   Intake/Output from previous day: 02/02 0701 - 02/03 0700 In: 1888.3 [P.O.:480; I.V.:1408.3] Out: 1725 [Urine:1625; Blood:100] Intake/Output this shift:     General appearance: alert and no distress   Incision:  scant drainage  Lab Results:   Basename 11/14/11 0146 11/14/11 0130  WBC -- 13.3*  HGB 13.3 13.1  HCT 39.0 38.1  PLT -- 256   BMET  Basename 11/14/11 0146  NA 141  K 3.9  CL 108  CO2 --  GLUCOSE 127*  BUN 14  CREATININE 0.90  CALCIUM --   PT/INR No results found for this basename: LABPROT:2,INR:2 in the last 72 hours ABG No results found for this basename: PHART:2,PCO2:2,PO2:2,HCO3:2 in the last 72 hours  MEDS, Scheduled    . cefOXitin  2 g Intravenous Q6H  . Chlorhexidine Gluconate Cloth  6 each Topical Q0600  . diphenhydrAMINE      . levothyroxine  50 mcg Oral QAC breakfast  . mupirocin ointment  1 application Nasal BID  . pantoprazole (PROTONIX) IV  40 mg Intravenous QHS  . PARoxetine  20 mg Oral Daily    Studies/Results: Ct Pelvis W Contrast  11/14/2011  *RADIOLOGY REPORT*  Clinical Data:  Rectal pain, evaluate for abscess  CT PELVIS WITH CONTRAST  Technique:  Multidetector CT imaging of the pelvis was performed using the standard protocol following the bolus administration of intravenous contrast.  Contrast: OMNIPAQUE IOHEXOL 300 MG/ML IV SOLN  Comparison:  08/23/2011  Findings:  Intraperitoneal contents within normal limits.  No bowel obstruction.  The rectum is  normal in appearance. Within the left posterior subcutaneous fat adjacent to the gluteal cleft is a 3.6 x 3.0 cm peripherally enhancing fluid collection.  The collection extends via a tract superiorly the left perianal/ischioanal fat. No bowel contrast appreciated within the tract or abscess.  IMPRESSION: Abscess within the left posterior subcutaneous fat.  There does appear to be a thin tract which extends superiorly toward the left perianal fat. However, no rectal contrast communicates with the tract or abscess.  Original Report Authenticated By: Waneta Martins, M.D.    Assessment/Plan: s/p Procedure(s): IRRIGATION AND DEBRIDEMENT PERIRECTAL ABSCESS Improved post op Allergic reaction (itch) to vicodin  Will start sitz today, stop vicodin and try dilaudid  LOS: 2 days    Conroy Goracke J 11/15/2011

## 2011-11-16 ENCOUNTER — Encounter (HOSPITAL_COMMUNITY): Payer: Self-pay | Admitting: General Surgery

## 2011-11-16 MED ORDER — PANTOPRAZOLE SODIUM 40 MG PO TBEC
40.0000 mg | DELAYED_RELEASE_TABLET | Freq: Every day | ORAL | Status: DC
  Administered 2011-11-16 – 2011-11-17 (×2): 40 mg via ORAL
  Filled 2011-11-16 (×2): qty 1

## 2011-11-16 NOTE — Progress Notes (Signed)
UR of chart completed.  

## 2011-11-16 NOTE — Progress Notes (Signed)
Patient examined and I agree with the assessment and plan  Violeta Gelinas, MD, MPH, FACS Pager: [phone]  11/16/2011 9:22 AM

## 2011-11-16 NOTE — Progress Notes (Signed)
2 Days Post-Op  Subjective: Pt ok. Still sore, but pain control better.   Objective: Vital signs in last 24 hours: Temp:  [97.6 F (36.4 C)-98.7 F (37.1 C)] 98.4 F (36.9 C) (02/04 0631) Pulse Rate:  [69-86] 69  (02/03 2306) Resp:  [18] 18  (02/03 2306) BP: (103-129)/(51-69) 116/55 mmHg (02/04 0631) SpO2:  [95 %-100 %] 95 % (02/04 0631) Last BM Date: 11/15/11  Intake/Output this shift:    Physical Exam: BP 116/55  Pulse 69  Temp(Src) 98.4 F (36.9 C) (Oral)  Resp 18  Ht 5\' 1"  (1.549 m)  Wt 129.275 kg (285 lb)  BMI 53.85 kg/m2  SpO2 95%  LMP 06/17/2007 Wound dressing intact  Labs: CBC  Basename 11/14/11 0146 11/14/11 0130  WBC -- 13.3*  HGB 13.3 13.1  HCT 39.0 38.1  PLT -- 256   BMET  Basename 11/14/11 0146  NA 141  K 3.9  CL 108  CO2 --  GLUCOSE 127*  BUN 14  CREATININE 0.90  CALCIUM --   LFT No results found for this basename: PROT,ALBUMIN,AST,ALT,ALKPHOS,BILITOT,BILIDIR,IBILI,LIPASE in the last 72 hours PT/INR No results found for this basename: LABPROT:2,INR:2 in the last 72 hours ABG No results found for this basename: PHART:2,PCO2:2,PO2:2,HCO3:2 in the last 72 hours  Studies/Results: No results found.  Assessment: Principal Problem:  *Perirectal abscess   Procedure(s): IRRIGATION AND DEBRIDEMENT PERIRECTAL ABSCESS  Plan: Begin BID dressing changes, NS packing, husband feels he is capable of doing dressing at home. Culture pending.  LOS: 3 days    Alyse Low 11/16/2011 8:32 AM

## 2011-11-17 LAB — CULTURE, ROUTINE-ABSCESS

## 2011-11-17 MED ORDER — CIPROFLOXACIN HCL 500 MG PO TABS: 500.0000 mg | ORAL_TABLET | Freq: Two times a day (BID) | ORAL | Status: AC

## 2011-11-17 MED ORDER — TRAMADOL HCL 50 MG PO TABS: 50.0000 mg | ORAL_TABLET | Freq: Four times a day (QID) | ORAL | Status: AC | PRN

## 2011-11-17 NOTE — Discharge Summary (Signed)
Physician Discharge Summary  Patient ID: [name] MRN: [id number] DOB/AGE: 15-Dec-1960 51 y.o.  Admit date: 11/13/2011 Discharge date: 11/17/2011  Admission Diagnoses: Perirectal abscess Discharge Diagnoses:  Principal Problem:  *Perirectal abscess    Procedures: Procedure(s): IRRIGATION AND DEBRIDEMENT PERIRECTAL ABSCESS  Discharged Condition: good  Hospital Course: HPI: 51yo wf who is morbidly obese presents with rectal pain for the last week. She says it feels like she is sitting on a ball. Denies fever. She has had some diarrhea. Pain is not getting any better. She has had one other similar episode in the last 5 years .  She was admitted by Dr. Carolynne Edouard and taken to the OR on 11/14/11 by Dr. Janee Morn and underwent I&D. She tolerated procedure well. Post-op, no major issues, pain much better. Tolerated dressing changes well. Cx has grown out E. Coli. Her husband will be able to do the dressing change.  Consults: None   Discharge Exam: Blood pressure 137/71, pulse 69, temperature 97.9 F (36.6 C), temperature source Oral, resp. rate 18, height 5\' 1"  (1.549 m), weight 129.275 kg (285 lb), last menstrual period 06/17/2007, SpO2 99.00%. Perirectal wound clean, no induration, no odor.  Disposition: Home or Self Care  Discharge Orders    Future Orders Please Complete By Expires   Diet general      Increase activity slowly      May shower / Bathe      Activity as tolerated - No restrictions      Change dressing (specify)      Comments:   Dressing change: Pack wound with saline moistened gauze. Cover with dry dressing. Change daily and as needed.   Call MD for:  redness, tenderness, or signs of infection (pain, swelling, redness, odor or green/yellow discharge around incision site)      Call MD for:  severe uncontrolled pain      Call MD for:  persistant nausea and vomiting      Call MD for:  temperature >100.4        Medication List  As of 11/17/2011  8:51 AM   TAKE these  medications         acetaminophen 500 MG tablet   Commonly known as: TYLENOL   Take 1,000 mg by mouth every 4 (four) hours as needed. Every 4-6 hours as needed for pain.      ciprofloxacin 500 MG tablet   Commonly known as: CIPRO   Take 1 tablet (500 mg total) by mouth 2 (two) times daily.      levothyroxine 50 MCG tablet   Commonly known as: SYNTHROID, LEVOTHROID   Take 50 mcg by mouth daily.      PARoxetine 20 MG tablet   Commonly known as: PAXIL   Take 1 tablet (20 mg total) by mouth daily.      traMADol 50 MG tablet   Commonly known as: ULTRAM   Take 1 tablet (50 mg total) by mouth every 6 (six) hours as needed for pain.           Follow-up Information    Follow up with CCS,MD, MD on 11/24/2011. ([facility] clinic 2:40pm)    Contact information:   [facility] [address] [place] [address] [phone]          Signed: Alyse Low 11/17/2011, 8:51 AM

## 2011-11-17 NOTE — Progress Notes (Signed)
3 Days Post-Op  Subjective: Pt ok. Feeling better  Objective: Vital signs in last 24 hours: Temp:  [97.9 F (36.6 C)-98.4 F (36.9 C)] 97.9 F (36.6 C) (02/05 0556) Pulse Rate:  [69-80] 69  (02/05 0556) Resp:  [18-20] 18  (02/05 0556) BP: (120-137)/(69-75) 137/71 mmHg (02/05 0556) SpO2:  [98 %-99 %] 99 % (02/05 0556) Last BM Date: 11/16/11  Intake/Output this shift:    Physical Exam: BP 137/71  Pulse 69  Temp(Src) 97.9 F (36.6 C) (Oral)  Resp 18  Ht 5\' 1"  (1.549 m)  Wt 129.275 kg (285 lb)  BMI 53.85 kg/m2  SpO2 99%  LMP 06/17/2007 Dressing stable, induration resolved.  Labs: Culture: E.Coli Assessment: Principal Problem:  *Perirectal abscess   Procedure(s): IRRIGATION AND DEBRIDEMENT PERIRECTAL ABSCESS  Plan: Will Rx Cipro for 10 days Daily NS packing by pt family. Will see next week in office DC home today  LOS: 4 days    Alyse Low 11/17/2011 8:45 AM

## 2011-11-17 NOTE — Progress Notes (Signed)
Agree Mattox Schorr, MD, MPH, FACS Pager: [phone]  

## 2011-11-17 NOTE — Discharge Summary (Signed)
Lonnell Chaput, MD, MPH, FACS Pager: [phone]  

## 2011-11-18 ENCOUNTER — Telehealth (INDEPENDENT_AMBULATORY_CARE_PROVIDER_SITE_OTHER): Payer: Self-pay | Admitting: General Surgery

## 2011-11-18 NOTE — Telephone Encounter (Signed)
Please call this in for her.  It was sent in [facility] electronically but something must have happened. Please call her in Tramadol 50mg  PO Q  6 hours prn pain.

## 2011-11-19 NOTE — Telephone Encounter (Signed)
rx called to [facility] in [facility] for Tramadol 50mg  1q6h prn pain #30

## 2011-11-23 LAB — ANAEROBIC CULTURE

## 2011-11-24 ENCOUNTER — Ambulatory Visit (INDEPENDENT_AMBULATORY_CARE_PROVIDER_SITE_OTHER): Payer: Self-pay | Admitting: General Surgery

## 2011-11-24 ENCOUNTER — Encounter (INDEPENDENT_AMBULATORY_CARE_PROVIDER_SITE_OTHER): Payer: Self-pay | Admitting: General Surgery

## 2011-11-24 VITALS — BP 128/92 | HR 68 | Temp 97.8°F | Resp 18 | Ht 61.0 in | Wt 273.6 lb

## 2011-11-24 DIAGNOSIS — Z9889 Other specified postprocedural states: Secondary | ICD-10-CM

## 2011-11-24 DIAGNOSIS — K611 Rectal abscess: Secondary | ICD-10-CM

## 2011-11-24 DIAGNOSIS — K612 Anorectal abscess: Secondary | ICD-10-CM

## 2011-11-24 NOTE — Progress Notes (Signed)
[name] 02-05-2061  HPI: [name] presents today for a postoperative check s/p I&D of perirectal abscess.  The patient is doing well.  Her husband is packing her wound once a day.  She had some slight bleeding on Saturday, but this resolved.  PE: Buttock: wound is still approximately 3cm in length, but about 1-2cm in depth.  It is 100% clean with good granulation tissue.  No surrounding erythema or induration.  A/P: 1. S/p I&D of perirectal abscess.  The patient will return to see [place] in clinic in 3 weeks as she will be out of town in 2 weeks.  I have informed her to continue with daily NS WD dressing changes.

## 2011-11-24 NOTE — Patient Instructions (Signed)
Continue daily dressing changes to wound.

## 2011-12-15 ENCOUNTER — Encounter (INDEPENDENT_AMBULATORY_CARE_PROVIDER_SITE_OTHER): Payer: Self-pay

## 2011-12-28 LAB — AFB CULTURE WITH SMEAR (NOT AT ARMC)

## 2012-01-04 ENCOUNTER — Ambulatory Visit (HOSPITAL_COMMUNITY)
Admission: RE | Admit: 2012-01-04 | Discharge: 2012-01-04 | Disposition: A | Discharge status: Home / Self Care | Payer: Self-pay | Source: Ambulatory Visit | Attending: Internal Medicine | Admitting: Internal Medicine

## 2012-01-04 ENCOUNTER — Telehealth: Payer: Self-pay | Admitting: *Deleted

## 2012-01-04 ENCOUNTER — Ambulatory Visit (INDEPENDENT_AMBULATORY_CARE_PROVIDER_SITE_OTHER): Payer: Self-pay | Admitting: Internal Medicine

## 2012-01-04 ENCOUNTER — Encounter: Payer: Self-pay | Admitting: Internal Medicine

## 2012-01-04 VITALS — BP 171/92 | HR 89 | Temp 98.1°F | Ht 61.0 in | Wt 276.0 lb

## 2012-01-04 DIAGNOSIS — H6090 Unspecified otitis externa, unspecified ear: Secondary | ICD-10-CM | POA: Insufficient documentation

## 2012-01-04 DIAGNOSIS — H60399 Other infective otitis externa, unspecified ear: Secondary | ICD-10-CM

## 2012-01-04 DIAGNOSIS — R030 Elevated blood-pressure reading, without diagnosis of hypertension: Secondary | ICD-10-CM

## 2012-01-04 DIAGNOSIS — R059 Cough, unspecified: Secondary | ICD-10-CM | POA: Insufficient documentation

## 2012-01-04 DIAGNOSIS — E039 Hypothyroidism, unspecified: Secondary | ICD-10-CM

## 2012-01-04 DIAGNOSIS — R05 Cough: Secondary | ICD-10-CM

## 2012-01-04 LAB — T4, FREE: Free T4: 0.96 ng/dL (ref 0.80–1.80)

## 2012-01-04 MED ORDER — OFLOXACIN 0.3 % OT SOLN: 5.0000 [drp] | Freq: Every day | OTIC | Status: AC

## 2012-01-04 MED ORDER — GUAIFENESIN-CODEINE 100-10 MG/5ML PO SYRP: 5.0000 mL | ORAL_SOLUTION | Freq: Three times a day (TID) | ORAL | Status: AC | PRN

## 2012-01-04 NOTE — Assessment & Plan Note (Addendum)
Check TSH and free T4, based upon lab results we'll decide whether to continue or adjust her current dose of Synthroid Addendum: TSH elevated however free T4 is normal, no adjustments at this point given that the patient is asymptomatic. We'll recheck TSH and free T4 at next visit.

## 2012-01-04 NOTE — Assessment & Plan Note (Signed)
Prescribe ofloxacin, and for the patient to return if her symptoms are not improved

## 2012-01-04 NOTE — Patient Instructions (Signed)
--   Please take your medications as prescribed.

## 2012-01-04 NOTE — Telephone Encounter (Signed)
Pt calls and c/o cough, congestion and wheezing, cough nonproductive "tight". Denies fever, any/ all other problems, states she cannot sleep. appt per [id number] dr Gilford Rile

## 2012-01-04 NOTE — Assessment & Plan Note (Signed)
Lungs clear to exam, likely secondary to viral URI, she's not on any medication to explain her cough. Prescribed cough syrup and obtain a chest x-ray today.

## 2012-01-04 NOTE — Progress Notes (Signed)
Patient ID: [name], female   DOB: Mar 25, 1961, 51 y.o.   MRN: [id number]  HPI:   Patient is a 51 year old female with a past medical history listed below, presents to the outpatient clinic with complaints of dry cough since Friday, denies any fever or chills, but reports the cough is preventing her from performing daily activities or sleep, reports also bilateral ear pain, especially on manipulation of her ears. No other complaints.   Review of Systems: Negative except per history of present illness  Physical Exam:  Nursing notes and vitals reviewed General:  alert, well-developed, and cooperative to examination.   ENT: Throat clear, ear show bilaterally intact tympanic membrane, with sign of inflammation around the ear canal.  Lungs:  normal respiratory effort, no accessory muscle use, normal breath sounds, no crackles, and no wheezes. Heart:  normal rate, regular rhythm, no murmurs, no gallop, and no rub.   Abdomen:  soft, non-tender, normal bowel sounds, no distention, no guarding, no rebound tenderness, no hepatomegaly, and no splenomegaly.   Extremities:  No cyanosis, clubbing, edema Neurologic:  alert & oriented X3, nonfocal exam  Meds: Medications Prior to Admission  Medication Sig Dispense Refill  . acetaminophen (TYLENOL) 500 MG tablet Take 1,000 mg by mouth every 4 (four) hours as needed. Every 4-6 hours as needed for pain.      Marland Kitchen levothyroxine (SYNTHROID, LEVOTHROID) 50 MCG tablet Take 50 mcg by mouth daily.        Marland Kitchen PARoxetine (PAXIL) 20 MG tablet Take 1 tablet (20 mg total) by mouth daily.  30 tablet  2   No current facility-administered medications on file as of 01/04/2012.    Allergies: Doxycycline; Hydrocodone; Oxycodone; and Sulfamethoxazole w/trimethoprim Past Medical History  Diagnosis Date  . Hypertriglyceridemia     164 - 2/11  . Panic disorder 11/05  . Obesity     250 lbs 8/11  . Atypical chest pain     Negative cardiolite at [facility] cards 11/07  .  Anemia, iron deficiency     Requried 2u prbc 6/09, menometrorrhagia, uterine fibroids.  . Hypothyroidism   . Insomnia   . Allergic rhinitis   . Elevated BP    Past Surgical History  Procedure Date  . Other hysterectomy   . Incision and drainage perirectal abscess 11/14/2011    Procedure: IRRIGATION AND DEBRIDEMENT PERIRECTAL ABSCESS;  Surgeon: Liz Malady, MD;  Location: [facility] OR;  Service: General;  Laterality: N/A;  . Cholecystectomy 07/30/1999  . Abdominal hysterectomy 04/2007   Family History  Problem Relation Age of Onset  . Heart attack Mother   . Lung cancer Mother     metestatic, + smoker  . Aneurysm Mother   . Hyperlipidemia Mother   . Hypertension Mother   . Hypertension Father    History   Social History  . Marital Status: Married    Spouse Name: N/A    Number of Children: N/A  . Years of Education: N/A   Occupational History  . Not on file.   Social History Main Topics  . Smoking status: Never Smoker   . Smokeless tobacco: Never Used  . Alcohol Use: No  . Drug Use: No  . Sexually Active: Yes   Other Topics Concern  . Not on file   Social History Narrative   Currently unemployed. Starting college in Aug for accounting.  Married, has 2 children lives with her youngest son.

## 2012-01-04 NOTE — Assessment & Plan Note (Signed)
Elevated likely secondary to acute presentation, recheck at next visit

## 2012-01-20 ENCOUNTER — Ambulatory Visit (INDEPENDENT_AMBULATORY_CARE_PROVIDER_SITE_OTHER): Payer: Self-pay | Admitting: Internal Medicine

## 2012-01-20 ENCOUNTER — Encounter: Payer: Self-pay | Admitting: Internal Medicine

## 2012-01-20 VITALS — BP 155/92 | HR 80 | Temp 97.3°F | Ht 61.0 in | Wt 273.4 lb

## 2012-01-20 DIAGNOSIS — F410 Panic disorder [episodic paroxysmal anxiety] without agoraphobia: Secondary | ICD-10-CM

## 2012-01-20 DIAGNOSIS — H6090 Unspecified otitis externa, unspecified ear: Secondary | ICD-10-CM

## 2012-01-20 DIAGNOSIS — J020 Streptococcal pharyngitis: Secondary | ICD-10-CM

## 2012-01-20 DIAGNOSIS — H60399 Other infective otitis externa, unspecified ear: Secondary | ICD-10-CM

## 2012-01-20 MED ORDER — PENICILLIN V POTASSIUM 500 MG PO TABS: 500.0000 mg | ORAL_TABLET | Freq: Three times a day (TID) | ORAL | Status: AC

## 2012-01-20 MED ORDER — CLONAZEPAM 0.5 MG PO TABS: 0.5000 mg | ORAL_TABLET | Freq: Every evening | ORAL | Status: DC | PRN

## 2012-01-20 NOTE — Patient Instructions (Signed)
1. Please finish the PCN V 2. Please take Klonopin for your panic attacks 3. Follow up in 2 weeks.

## 2012-01-20 NOTE — Progress Notes (Addendum)
Patient ID: [name], female   DOB: 05-05-1961, 51 y.o.   MRN: [id number]  Subjective:   Patient ID: [name] female   DOB: Jul 01, 1961 51 y.o.   MRN: [id number]  HPI:   Patient is a 51 yo female with a past medical history listed below, presents to the outpatient clinic for follow up after last visit. She states that she started to have cold-like symptoms over 2 weeks ago, including nonproductive cough, ear aching w/o drainage and sore throat. She denies fever, chills or SOB. She was evaluated by Dr. Gilford Rile who ordered CXR which was negative for acute abnormality. She was given ofloxacin ear drops for her ear aching. Patient states that she did not fill her prescription since she could not afford it. She reports that her cough has completely resolved and her ear aching is better.  she states that she still have sore throat worsening with coughing or swallowing. She also noticed a tender "lump on her left neck. She is here for further evalaution.   No headache, fever.  No shortness of breath or dyspnea on exertion. No chest pain, chest pressure or palpitation No nausea, vomiting, or abdominal pain. No melena, diarrhea or incontinence. No muscle weakness.                   Denies depression. No appetite or weight changes.   Meds: Medications Prior to Admission  Medication Sig Dispense Refill  . acetaminophen (TYLENOL) 500 MG tablet Take 1,000 mg by mouth every 4 (four) hours as needed. Every 4-6 hours as needed for pain.      Marland Kitchen levothyroxine (SYNTHROID, LEVOTHROID) 50 MCG tablet Take 50 mcg by mouth daily.        Marland Kitchen PARoxetine (PAXIL) 20 MG tablet Take 1 tablet (20 mg total) by mouth daily.  30 tablet  2   No current facility-administered medications on file as of 01/20/2012.    Allergies: Doxycycline; Hydrocodone; Oxycodone; and Sulfamethoxazole w/trimethoprim Past Medical History  Diagnosis Date  . Hypertriglyceridemia     164 - 2/11  . Panic disorder 11/05  . Obesity     250  lbs 8/11  . Atypical chest pain     Negative cardiolite at [facility] cards 11/07  . Anemia, iron deficiency     Requried 2u prbc 6/09, menometrorrhagia, uterine fibroids.  . Hypothyroidism   . Insomnia   . Allergic rhinitis   . Elevated BP    Past Surgical History  Procedure Date  . Other hysterectomy   . Incision and drainage perirectal abscess 11/14/2011    Procedure: IRRIGATION AND DEBRIDEMENT PERIRECTAL ABSCESS;  Surgeon: Liz Malady, MD;  Location: [facility] OR;  Service: General;  Laterality: N/A;  . Cholecystectomy 07/30/1999  . Abdominal hysterectomy 04/2007   Family History  Problem Relation Age of Onset  . Heart attack Mother   . Lung cancer Mother     metestatic, + smoker  . Aneurysm Mother   . Hyperlipidemia Mother   . Hypertension Mother   . Hypertension Father    History   Social History  . Marital Status: Married    Spouse Name: N/A    Number of Children: N/A  . Years of Education: N/A   Occupational History  . Not on file.   Social History Main Topics  . Smoking status: Never Smoker   . Smokeless tobacco: Never Used  . Alcohol Use: No  . Drug Use: No  . Sexually Active: Yes  Other Topics Concern  . Not on file   Social History Narrative   Currently unemployed. Starting college in Aug for accounting.  Married, has 2 children lives with her youngest son.           Past Medical History  Diagnosis Date  . Hypertriglyceridemia     164 - 2/11  . Panic disorder 11/05  . Obesity     250 lbs 8/11  . Atypical chest pain     Negative cardiolite at [facility] cards 11/07  . Anemia, iron deficiency     Requried 2u prbc 6/09, menometrorrhagia, uterine fibroids.  . Hypothyroidism   . Insomnia   . Allergic rhinitis   . Elevated BP    Current Outpatient Prescriptions  Medication Sig Dispense Refill  . acetaminophen (TYLENOL) 500 MG tablet Take 1,000 mg by mouth every 4 (four) hours as needed. Every 4-6 hours as needed for pain.      Marland Kitchen levothyroxine  (SYNTHROID, LEVOTHROID) 50 MCG tablet Take 50 mcg by mouth daily.        Marland Kitchen PARoxetine (PAXIL) 20 MG tablet Take 1 tablet (20 mg total) by mouth daily.  30 tablet  2  . clonazePAM (KLONOPIN) 0.5 MG tablet Take 1 tablet (0.5 mg total) by mouth at bedtime as needed for anxiety.  30 tablet  0  . penicillin v potassium (VEETID) 500 MG tablet Take 1 tablet (500 mg total) by mouth 3 (three) times daily.  30 tablet  0   Family History  Problem Relation Age of Onset  . Heart attack Mother   . Lung cancer Mother     metestatic, + smoker  . Aneurysm Mother   . Hyperlipidemia Mother   . Hypertension Mother   . Hypertension Father    History   Social History  . Marital Status: Married    Spouse Name: N/A    Number of Children: N/A  . Years of Education: N/A   Social History Main Topics  . Smoking status: Never Smoker   . Smokeless tobacco: Never Used  . Alcohol Use: No  . Drug Use: No  . Sexually Active: Yes   Other Topics Concern  . None   Social History Narrative   Currently unemployed. Starting college in Aug for accounting.  Married, has 2 children lives with her youngest son.     Review of Systems: See HPI  Objective:  Physical Exam: Filed Vitals:   01/20/12 1629  BP: 155/92  Pulse: 80  Temp: 97.3 F (36.3 C)  TempSrc: Oral  Height: 5\' 1"  (1.549 m)  Weight: 273 lb 6.4 oz (124.013 kg)   General: alert, well-developed, and cooperative to examination.  Head: normocephalic and atraumatic.  Eyes: vision grossly intact, pupils equal, pupils round, pupils reactive to light, no injection and anicteric.  Mouth: erythema noted on her pharynx, Left Tonsil exudates noted. B/L submandibular lymph nodes mild enlargement with tenderness to palpation noted on the left. No local erythema or drainage noted  Neck: supple, full ROM, no thyromegaly, no JVD, and no carotid bruits.  Lungs: normal respiratory effort, no accessory muscle use, normal breath sounds, no crackles, and no  wheezes. Heart: normal rate, regular rhythm, no murmur, no gallop, and no rub.  Abdomen: soft, non-tender, normal bowel sounds, no distention, no guarding, no rebound tenderness, no hepatomegaly, and no splenomegaly.  Msk: no joint swelling, no joint warmth, and no redness over joints.  Pulses: 2+ DP/PT pulses bilaterally Extremities: No cyanosis, clubbing, edema Neurologic:  alert & oriented X3, cranial nerves II-XII intact, strength normal in all extremities, sensation intact to light touch, and gait normal.  Skin: turgor normal and no rashes.  Psych: Oriented X3, memory intact for recent and remote, normally interactive, good eye contact, not anxious appearing, and not depressed appearing.   Assessment & Plan:

## 2012-01-21 ENCOUNTER — Encounter: Payer: Self-pay | Admitting: Internal Medicine

## 2012-01-21 DIAGNOSIS — J020 Streptococcal pharyngitis: Secondary | ICD-10-CM | POA: Insufficient documentation

## 2012-01-21 NOTE — Assessment & Plan Note (Signed)
Resolving. No s/s inflammation noted  - will give her oral PCN

## 2012-01-21 NOTE — Progress Notes (Signed)
Patient ID: [name], female   DOB: 1961-01-21, 51 y.o.   MRN: [id number]

## 2012-01-21 NOTE — Assessment & Plan Note (Signed)
Patient states that she still has occasional panic attacks  - add Klonopin daily PRN for panic attacks.

## 2012-01-21 NOTE — Assessment & Plan Note (Addendum)
The clinical picture is consistent with the Dx.   - ABX>>>PCN x 10 days -follow up in 2 weeks.

## 2012-01-21 NOTE — Progress Notes (Signed)
Addended byDierdre Searles, Jeannine Pennisi on: 01/21/2012 09:42 AM   Modules accepted: Level of Service

## 2012-02-19 ENCOUNTER — Other Ambulatory Visit: Payer: Self-pay | Admitting: *Deleted

## 2012-02-19 MED ORDER — PAROXETINE HCL 20 MG PO TABS: 20.0000 mg | ORAL_TABLET | Freq: Every day | ORAL | Status: DC

## 2012-02-21 ENCOUNTER — Emergency Department (HOSPITAL_COMMUNITY): Payer: Self-pay

## 2012-02-21 ENCOUNTER — Emergency Department (HOSPITAL_COMMUNITY)
Admission: EM | Admit: 2012-02-21 | Discharge: 2012-02-22 | Disposition: A | Discharge status: Home / Self Care | Payer: Self-pay | Attending: Emergency Medicine | Admitting: Emergency Medicine

## 2012-02-21 DIAGNOSIS — E669 Obesity, unspecified: Secondary | ICD-10-CM | POA: Insufficient documentation

## 2012-02-21 DIAGNOSIS — Z79899 Other long term (current) drug therapy: Secondary | ICD-10-CM | POA: Insufficient documentation

## 2012-02-21 DIAGNOSIS — R079 Chest pain, unspecified: Secondary | ICD-10-CM | POA: Insufficient documentation

## 2012-02-21 DIAGNOSIS — R110 Nausea: Secondary | ICD-10-CM | POA: Insufficient documentation

## 2012-02-21 DIAGNOSIS — E039 Hypothyroidism, unspecified: Secondary | ICD-10-CM | POA: Insufficient documentation

## 2012-02-21 LAB — CBC
HCT: 39.3 % (ref 36.0–46.0)
MCV: 88.3 fL (ref 78.0–100.0)
Platelets: 236 10*3/uL (ref 150–400)
RBC: 4.45 MIL/uL (ref 3.87–5.11)
WBC: 8.5 10*3/uL (ref 4.0–10.5)

## 2012-02-21 LAB — BASIC METABOLIC PANEL
CO2: 26 mEq/L (ref 19–32)
Calcium: 8.9 mg/dL (ref 8.4–10.5)
Chloride: 103 mEq/L (ref 96–112)
Glucose, Bld: 143 mg/dL — ABNORMAL HIGH (ref 70–99)
Potassium: 3.6 mEq/L (ref 3.5–5.1)
Sodium: 139 mEq/L (ref 135–145)

## 2012-02-21 LAB — DIFFERENTIAL
Basophils Absolute: 0 10*3/uL (ref 0.0–0.1)
Eosinophils Relative: 2 % (ref 0–5)
Lymphocytes Relative: 18 % (ref 12–46)
Lymphs Abs: 1.5 10*3/uL (ref 0.7–4.0)
Neutro Abs: 6.4 10*3/uL (ref 1.7–7.7)

## 2012-02-21 LAB — POCT I-STAT TROPONIN I: Troponin i, poc: 0 ng/mL (ref 0.00–0.08)

## 2012-02-21 MED ORDER — MORPHINE SULFATE 4 MG/ML IJ SOLN: 4.0000 mg | INTRAMUSCULAR | Status: DC

## 2012-02-21 MED ORDER — ONDANSETRON 4 MG PO TBDP
4.0000 mg | ORAL_TABLET | Freq: Once | ORAL | Status: AC
  Administered 2012-02-21: 4 mg via ORAL
  Filled 2012-02-21: qty 1

## 2012-02-21 MED ORDER — DIPHENHYDRAMINE HCL 50 MG/ML IJ SOLN: 12.5000 mg | Freq: Once | INTRAMUSCULAR | Status: DC

## 2012-02-21 MED ORDER — MORPHINE SULFATE 4 MG/ML IJ SOLN
4.0000 mg | INTRAMUSCULAR | Status: AC
  Administered 2012-02-21: 4 mg via INTRAMUSCULAR
  Filled 2012-02-21: qty 1

## 2012-02-21 NOTE — ED Notes (Signed)
PT. REPORTS MID CHEST PAIN WITH SOB AND NAUSEA ONSET THIS EVENING , DENIES VOMITTING OR DIAPHORESIS.

## 2012-02-21 NOTE — ED Provider Notes (Signed)
History     CSN: [id number]  Arrival date & time 02/21/12  2005   None     Chief Complaint  Patient presents with  . Chest Pain    (Consider location/radiation/quality/duration/timing/severity/associated sxs/prior treatment) Patient is a 51 y.o. female presenting with chest pain. The history is provided by the patient.  Chest Pain The chest pain began 1 - 2 hours ago. Duration of episode(s) is 1 hour. Chest pain occurs constantly. The chest pain is improving. Associated with: unknown. At its most intense, the pain is at 7/10. The pain is currently at 4/10. The severity of the pain is mild. The quality of the pain is described as squeezing. The pain does not radiate. Primary symptoms include nausea. Pertinent negatives for primary symptoms include no fever, no fatigue, no shortness of breath, no cough, no abdominal pain, no vomiting and no dizziness. She tried nothing for the symptoms. Risk factors: HTN, HLP.     Past Medical History  Diagnosis Date  . Hypertriglyceridemia     164 - 2/11  . Panic disorder 11/05  . Obesity     250 lbs 8/11  . Atypical chest pain     Negative cardiolite at [facility] cards 11/07  . Anemia, iron deficiency     Requried 2u prbc 6/09, menometrorrhagia, uterine fibroids.  . Hypothyroidism   . Insomnia   . Allergic rhinitis   . Elevated BP     Past Surgical History  Procedure Date  . Other hysterectomy   . Incision and drainage perirectal abscess 11/14/2011    Procedure: IRRIGATION AND DEBRIDEMENT PERIRECTAL ABSCESS;  Surgeon: Liz Malady, MD;  Location: [facility] OR;  Service: General;  Laterality: N/A;  . Cholecystectomy 07/30/1999  . Abdominal hysterectomy 04/2007    Family History  Problem Relation Age of Onset  . Heart attack Mother   . Lung cancer Mother     metestatic, + smoker  . Aneurysm Mother   . Hyperlipidemia Mother   . Hypertension Mother   . Hypertension Father     History  Substance Use Topics  . Smoking status: Never Smoker    . Smokeless tobacco: Never Used  . Alcohol Use: No    OB History    Grav Para Term Preterm Abortions TAB SAB Ect Mult Living                  Review of Systems  Constitutional: Negative for fever and fatigue.  HENT: Negative for congestion, drooling and neck pain.   Eyes: Negative for pain.  Respiratory: Negative for cough and shortness of breath.   Cardiovascular: Positive for chest pain.  Gastrointestinal: Positive for nausea. Negative for vomiting, abdominal pain and diarrhea.  Genitourinary: Negative for dysuria and hematuria.  Musculoskeletal: Negative for back pain and gait problem.  Skin: Negative for color change.  Neurological: Negative for dizziness and headaches.  Hematological: Negative for adenopathy.  Psychiatric/Behavioral: Negative for behavioral problems.  All other systems reviewed and are negative.    Allergies  Doxycycline; Hydrocodone; Oxycodone; Codeine; and Sulfamethoxazole w-trimethoprim  Home Medications   Current Outpatient Rx  Name Route Sig Dispense Refill  . LEVOTHYROXINE SODIUM 50 MCG PO TABS Oral Take 50 mcg by mouth daily.      Marland Kitchen PAROXETINE HCL 20 MG PO TABS Oral Take 1 tablet (20 mg total) by mouth daily. 30 tablet 2    BP 109/69  Pulse 66  Temp(Src) 97.6 F (36.4 C) (Oral)  Resp 20  SpO2 97%  LMP 06/17/2007  Physical Exam  Constitutional: She is oriented to person, place, and time. She appears well-developed and well-nourished.       Obese.  HENT:  Head: Normocephalic.  Mouth/Throat: No oropharyngeal exudate.  Eyes: Conjunctivae and EOM are normal. Pupils are equal, round, and reactive to light.  Neck: Normal range of motion. Neck supple.  Cardiovascular: Normal rate, regular rhythm, normal heart sounds and intact distal pulses.  Exam reveals no gallop and no friction rub.   No murmur heard. Pulmonary/Chest: Effort normal and breath sounds normal. No respiratory distress. She has no wheezes.  Abdominal: Soft. Bowel sounds  are normal. There is no tenderness.  Musculoskeletal: Normal range of motion. She exhibits no edema and no tenderness.  Neurological: She is alert and oriented to person, place, and time.  Skin: Skin is warm and dry.  Psychiatric: She has a normal mood and affect. Her behavior is normal.    ED Course  Procedures (including critical care time)  Labs Reviewed  BASIC METABOLIC PANEL - Abnormal; Notable for the following:    Glucose, Bld 143 (*)    GFR calc non Af Amer 67 (*)    GFR calc Af Amer 78 (*)    All other components within normal limits  CBC  DIFFERENTIAL  POCT I-STAT TROPONIN I  TROPONIN I   Dg Chest 2 View  02/21/2012  *RADIOLOGY REPORT*  Clinical Data: Chest pain.  Nausea.  CHEST - 2 VIEW  Comparison: 01/04/2012  Findings:  Pectus excavatum noted on the lateral projection.  Cardiac and mediastinal contours appear normal.  The lungs appear clear.  No pleural effusion is identified.  IMPRESSION:  1.  Pectus excavatum. 2.   Otherwise, no significant abnormality identified.  Original Report Authenticated By: Dellia Cloud, M.D.     1. Chest pain   2. Nausea       MDM  12:44 AM 51 y.o. female w hx of atypical cp who pw chest pain and nausea that began approx 1 hr pta while at rest. Pain initially 7/10, now 4/10 on exam w/out tx. Pt AFVSS here, denies sob, appears comfortable on exam. Has had a negative stress Myoview test in August of 2012. Will get labs, cxr, delta trop.  Pain now 2/10 w/out tx. Will give morphine for pain.   12:44 AM: Delta trop negative. Pt appears well on exam, denies cp. Low suspicion for cardiac source of pt's sx. I have discussed the diagnosis/risks/treatment options with the patient and believe the pt to be eligible for discharge home to follow-up with pcp in 1 day if no better. We also discussed returning to the ED immediately if new or worsening sx occur. We discussed the sx which are most concerning (e.g., further cp, sob) that necessitate  immediate return. Any new prescriptions provided to the patient are listed below.  New Prescriptions   No medications on file    Clinical Impression 1. Chest pain   2. Nausea       Purvis Sheffield, MD 02/22/12 [phone]

## 2012-02-21 NOTE — ED Provider Notes (Signed)
51 year-old female history of atypical chest pain had onset this evening of the chest tightness which is similar to prior episodes. There was some dyspnea and minimal nausea. No vomiting and no diaphoresis. Her lungs are clear and heart has regular rate and rhythm. She does have 1-2+ peripheral edema. Workup in the emergency department his unremarkable and her old records of been reviewed and she had a negative stress Myoview test in August of last year. She says that her symptoms are similar to what she had at that time. She will be treated symptomatically. No evidence of cardiac disease currently.   Date: 02/21/2012  Rate: 80  Rhythm: normal sinus rhythm  QRS Axis: normal  Intervals: normal  ST/T Wave abnormalities: normal  Conduction Disutrbances:none  Narrative Interpretation: Poor R. wave progression. When compared with ECG of 05/25/2011, no significant changes are seen.  Old EKG Reviewed: unchanged    Dione Booze, MD 02/21/12 2232

## 2012-02-21 NOTE — ED Notes (Signed)
Pt c/o CP x 1 hour. States pain feels like throbbing. Originates in center chest and radiates to back. Pain exacerbated by movement, and relieved by nothing with symptom of nausea. Denies vomiting, diaphoresis, and burping.

## 2012-02-22 NOTE — Discharge Instructions (Signed)
Chest Pain (Nonspecific) Chest pain has many causes. Your pain could be caused by something serious, such as a heart attack or a blood clot in the lungs. It could also be caused by something less serious, such as a chest bruise or a virus. Follow up with your doctor. More lab tests or other studies may be needed to find the cause of your pain. Most of the time, nonspecific chest pain will improve within 2 to 3 days of rest and mild pain medicine. HOME CARE  For chest bruises, you may put ice on the sore area for 15 to 20 minutes, 3 to 4 times a day. Do this only if it makes you or your child feel better.   Put ice in a plastic bag.   Place a towel between the skin and the bag.   Rest for the next 2 to 3 days.   Go back to work if the pain improves.   See your doctor if the pain lasts longer than 1 to 2 weeks.   Only take medicine as told by your doctor.   Quit smoking if you smoke.  GET HELP RIGHT AWAY IF:   There is more pain or pain that spreads to the arm, neck, jaw, back, or belly (abdomen).   You or your child has shortness of breath.   You or your child coughs more than usual or coughs up blood.   You or your child has very bad back or belly pain, feels sick to his or her stomach (nauseous), or throws up (vomits).   You or your child has very bad weakness.   You or your child passes out (faints).   You or your child has a temperature by mouth above 102 F (38.9 C), not controlled by medicine.  Any of these problems may be serious and may be an emergency. Do not wait to see if the problems will go away. Get medical help right away. Call your local emergency services 911 in [place] Do not drive yourself to the hospital. MAKE SURE YOU:   Understand these instructions.   Will watch this condition.   Will get help right away if you or your child is not doing well or gets worse.  Document Released: 03/16/2008 Document Revised: 09/17/2011 Document Reviewed:  03/16/2008 [employer] Patient Information 2012 [employer]. 

## 2012-02-22 NOTE — ED Notes (Signed)
States understanding of discharge instructions.

## 2012-02-24 NOTE — ED Provider Notes (Signed)
I saw and evaluated the patient, reviewed the resident's note and I agree with the findings and plan.   Dione Booze, MD 02/24/12 1500

## 2012-05-18 ENCOUNTER — Encounter (HOSPITAL_COMMUNITY): Payer: Self-pay | Admitting: General Practice

## 2012-05-18 ENCOUNTER — Encounter: Payer: Self-pay | Admitting: Internal Medicine

## 2012-05-18 ENCOUNTER — Ambulatory Visit (INDEPENDENT_AMBULATORY_CARE_PROVIDER_SITE_OTHER): Payer: Self-pay | Admitting: Internal Medicine

## 2012-05-18 ENCOUNTER — Observation Stay (HOSPITAL_COMMUNITY)
Admission: AD | Admit: 2012-05-18 | Discharge: 2012-05-19 | Disposition: A | Discharge status: Home / Self Care | Payer: Self-pay | Source: Ambulatory Visit | Attending: Internal Medicine | Admitting: Internal Medicine

## 2012-05-18 VITALS — BP 131/82 | HR 60 | Temp 97.0°F | Resp 20 | Ht 61.0 in | Wt 264.2 lb

## 2012-05-18 DIAGNOSIS — R079 Chest pain, unspecified: Principal | ICD-10-CM | POA: Insufficient documentation

## 2012-05-18 DIAGNOSIS — F411 Generalized anxiety disorder: Secondary | ICD-10-CM | POA: Insufficient documentation

## 2012-05-18 DIAGNOSIS — F410 Panic disorder [episodic paroxysmal anxiety] without agoraphobia: Secondary | ICD-10-CM

## 2012-05-18 DIAGNOSIS — E039 Hypothyroidism, unspecified: Secondary | ICD-10-CM | POA: Insufficient documentation

## 2012-05-18 DIAGNOSIS — R0989 Other specified symptoms and signs involving the circulatory and respiratory systems: Secondary | ICD-10-CM | POA: Insufficient documentation

## 2012-05-18 DIAGNOSIS — R42 Dizziness and giddiness: Secondary | ICD-10-CM | POA: Insufficient documentation

## 2012-05-18 DIAGNOSIS — E669 Obesity, unspecified: Secondary | ICD-10-CM | POA: Insufficient documentation

## 2012-05-18 DIAGNOSIS — R0789 Other chest pain: Secondary | ICD-10-CM

## 2012-05-18 DIAGNOSIS — R0609 Other forms of dyspnea: Secondary | ICD-10-CM | POA: Insufficient documentation

## 2012-05-18 DIAGNOSIS — R209 Unspecified disturbances of skin sensation: Secondary | ICD-10-CM | POA: Insufficient documentation

## 2012-05-18 DIAGNOSIS — R0602 Shortness of breath: Secondary | ICD-10-CM

## 2012-05-18 DIAGNOSIS — R29898 Other symptoms and signs involving the musculoskeletal system: Secondary | ICD-10-CM | POA: Insufficient documentation

## 2012-05-18 DIAGNOSIS — E785 Hyperlipidemia, unspecified: Secondary | ICD-10-CM | POA: Insufficient documentation

## 2012-05-18 DIAGNOSIS — I10 Essential (primary) hypertension: Secondary | ICD-10-CM | POA: Insufficient documentation

## 2012-05-18 DIAGNOSIS — R110 Nausea: Secondary | ICD-10-CM | POA: Insufficient documentation

## 2012-05-18 DIAGNOSIS — D509 Iron deficiency anemia, unspecified: Secondary | ICD-10-CM | POA: Insufficient documentation

## 2012-05-18 HISTORY — DX: Angina pectoris, unspecified: I20.9

## 2012-05-18 LAB — DIFFERENTIAL
Basophils Absolute: 0 10*3/uL (ref 0.0–0.1)
Basophils Relative: 0 % (ref 0–1)
Monocytes Absolute: 0.5 10*3/uL (ref 0.1–1.0)
Neutro Abs: 7 10*3/uL (ref 1.7–7.7)
Neutrophils Relative %: 77 % (ref 43–77)

## 2012-05-18 LAB — COMPREHENSIVE METABOLIC PANEL
BUN: 14 mg/dL (ref 6–23)
CO2: 27 mEq/L (ref 19–32)
Chloride: 105 mEq/L (ref 96–112)
Creatinine, Ser: 1.03 mg/dL (ref 0.50–1.10)
GFR calc Af Amer: 73 mL/min — ABNORMAL LOW (ref >90)
GFR calc non Af Amer: 63 mL/min — ABNORMAL LOW (ref >90)
Glucose, Bld: 129 mg/dL — ABNORMAL HIGH (ref 70–99)
Total Bilirubin: 0.4 mg/dL (ref 0.3–1.2)

## 2012-05-18 LAB — T4, FREE: Free T4: 0.75 ng/dL — ABNORMAL LOW (ref 0.80–1.80)

## 2012-05-18 LAB — CK TOTAL AND CKMB (NOT AT ARMC): CK, MB: 1 ng/mL (ref 0.3–4.0)

## 2012-05-18 LAB — CBC
MCH: 29.9 pg (ref 26.0–34.0)
MCV: 89.4 fL (ref 78.0–100.0)
Platelets: 229 10*3/uL (ref 150–400)
RDW: 13.5 % (ref 11.5–15.5)
WBC: 9.1 10*3/uL (ref 4.0–10.5)

## 2012-05-18 LAB — GLUCOSE, CAPILLARY: Glucose-Capillary: 152 mg/dL — ABNORMAL HIGH (ref 70–99)

## 2012-05-18 LAB — TROPONIN I: Troponin I: <0.3 ng/mL (ref <0.30)

## 2012-05-18 LAB — CARDIAC PANEL(CRET KIN+CKTOT+MB+TROPI): Relative Index: INVALID (ref 0.0–2.5)

## 2012-05-18 MED ORDER — HEPARIN SODIUM (PORCINE) 5000 UNIT/ML IJ SOLN
5000.0000 [IU] | Freq: Three times a day (TID) | INTRAMUSCULAR | Status: DC
  Administered 2012-05-18 – 2012-05-19 (×2): 5000 [IU] via SUBCUTANEOUS
  Filled 2012-05-18 (×5): qty 1

## 2012-05-18 MED ORDER — MUPIROCIN 2 % EX OINT
1.0000 "application " | TOPICAL_OINTMENT | Freq: Two times a day (BID) | CUTANEOUS | Status: DC
  Administered 2012-05-18 – 2012-05-19 (×2): 1 via NASAL
  Filled 2012-05-18: qty 22

## 2012-05-18 MED ORDER — MORPHINE SULFATE 4 MG/ML IJ SOLN: 4.0000 mg | INTRAMUSCULAR | Status: DC | PRN

## 2012-05-18 MED ORDER — ALUM & MAG HYDROXIDE-SIMETH 200-200-20 MG/5ML PO SUSP: 30.0000 mL | Freq: Four times a day (QID) | ORAL | Status: DC | PRN

## 2012-05-18 MED ORDER — SODIUM CHLORIDE 0.9 % IV SOLN: INTRAVENOUS | Status: DC

## 2012-05-18 MED ORDER — SODIUM CHLORIDE 0.9 % IJ SOLN: 3.0000 mL | Freq: Two times a day (BID) | INTRAMUSCULAR | Status: DC

## 2012-05-18 MED ORDER — CHLORHEXIDINE GLUCONATE CLOTH 2 % EX PADS
6.0000 | MEDICATED_PAD | Freq: Every day | CUTANEOUS | Status: DC
  Administered 2012-05-19: 6 via TOPICAL

## 2012-05-18 MED ORDER — ONDANSETRON HCL 4 MG PO TABS: 4.0000 mg | ORAL_TABLET | Freq: Four times a day (QID) | ORAL | Status: DC | PRN

## 2012-05-18 MED ORDER — ACETAMINOPHEN 650 MG RE SUPP: 650.0000 mg | Freq: Four times a day (QID) | RECTAL | Status: DC | PRN

## 2012-05-18 MED ORDER — ONDANSETRON HCL 4 MG/2ML IJ SOLN: 4.0000 mg | Freq: Four times a day (QID) | INTRAMUSCULAR | Status: DC | PRN

## 2012-05-18 MED ORDER — ACETAMINOPHEN 325 MG PO TABS: 650.0000 mg | ORAL_TABLET | Freq: Four times a day (QID) | ORAL | Status: DC | PRN

## 2012-05-18 NOTE — Assessment & Plan Note (Addendum)
1. Chest pain rule out.    The patient is a 51 year old woman who presented with intermittent chest pain, shortness of breath and fatigue for 2 weeks. Her assessment was unremarkable. Cardiac enzyme x 1 negative at the clinic. Her EKG was unremarkable for any ischemia event.   The differential diagnosis include PE/PNA/aortic dissection/pericarditis/musculoskeletal pain/GERD/anxiety.   For PE>>> no tachycardia or tachypnea noted at the clinic, denies long-distance travel, does not take oral contraceptives (h/o TAH), no s/s of DVT, Geneva score 0 = low probability. Of noted. Negative CT angio noted in 2010.    For pneumonia>>> no cough or fever,  lung examination unremarkable, no leukocytosis.  Normal chest x-ray . PNA less likely.   For aortic dissection>>> the clinical manifestations is not consistent with aortic dissection. Her chest pain been 2 weeks.. She is hemodynamically stable. Even if she reports elevated BP with her chest pain episodes at times. The highest BP was 165/95. Her BP is 130's/80's at the clinic today. The aortic dissection is less likely.  For pericarditis>>> no precipitating factors. Her chest pain is not positional. No Rub noted on exam. EKG is unremarkable.   Plan:   - CE x 1 negative - EKG unremarkable  - Risk stratification including lipid panel and hemoglobin A1c  - likely noncardiac chest pain.      Patient presents with chest pain/pressure.  No abnormality noted on Exam. She has had a negative cardiac work up last August 2012 including Myoview.  - CE x1 - EKG

## 2012-05-18 NOTE — Patient Instructions (Signed)
1. Will admit to inpatient Tele for presyncope

## 2012-05-18 NOTE — H&P (Signed)
Hospital Admission Note Date: 05/18/2012  Patient name: [name] Medical record number: [id number] Date of birth: [birth year] Age: 51 y.o. Gender: female PCP: LI, NA, MD  Medical Service: Internal Medicine Teaching Service  Attending physician:  Dr. Kem Kays    1st Contact: Dr. Collier Bullock Pager: [phone] 2nd Contact: Dr. Dorthula Rue Pager: [phone]  After 5 pm or weekends: 1st Contact:      Pager: [phone] 2nd Contact:      Pager: [phone]  Chief Complaint: Room spinning  History of Present Illness: [name] is a 51 yo woman with a history of hypothyroidism, atypical chest pain with negative past work ups), obesity, HTN, HLD, anxiety, chronic iron def anemia, who presents from the IM outpatient clinic with an episode of dizziness during which "the room would not stop spinning". This happened after she ate something and she was walking from the bathroom back to the room. She was also having symptoms of leg weakness and tingling, nausea, blurry vision and maybe some chest pressure. She was helped into a chair and given some water but was still unable to stand without nausea and dizziness so she was referred for hospital admission.  [name] had come in to the clinic with complaints of chest pain and dyspnea on exertion for several weeks as well as fatigue for 1 month. She states that she has episodes of chest pain at home that occur sometimes with exertion, sometimes at rest. She describes the pain as "pressure" 9/10, radiating to right shoulder. At times, she also has dyspnea. ?maybe some palpitations also, which she calls "flutters".   When we see her, she denies currently having any dizziness or nausea or weakness. No chest pain, no shortness of breath. She states that the episode resolved shortly after arriving in her room. She does state that she had not been drinking much water/fluids today and she has also been under a lot of stress with the goings on at home. Her adult children live with her  and there is tension in those relationships at times, and in addition she is concerned about her husband's health. She says she has had an 11lb intentional weight loss since April, she has been walking more. No melena, no BRBPR, no fever or chills, no recent illnesses, no diarrhea, constipation. She states she has been taking synthroid as directed.  Of note, she did have an episode of chest pain/dysnpea yesterday which was relieved when she sat down and took a nitroglycerin SL tablet.  Meds: Outpatient medications include: synthroid, paxil  Allergies: Allergies as of 05/18/2012 - Review Complete 05/18/2012  Allergen Reaction Noted  . Codeine Itching 02/21/2012  . Doxycycline Nausea And Vomiting 02/15/2009  . Hydrocodone Itching 11/24/2011  . Oxycodone Itching 08/23/2011  . Sulfamethoxazole w-trimethoprim Nausea And Vomiting 02/15/2009   Past Medical History  Diagnosis Date  . Hypertriglyceridemia     164 - 2/11  . Panic disorder 11/05  . Obesity     250 lbs 8/11  . Atypical chest pain     Negative cardiolite at [facility] cards 11/07  . Anemia, iron deficiency     Requried 2u prbc 6/09, menometrorrhagia, uterine fibroids.  . Hypothyroidism   . Insomnia   . Allergic rhinitis   . Elevated BP   . Anginal pain   . Shortness of breath 05/18/2012    "at rest; lying down; w/exertion"   Past Surgical History  Procedure Date  . Incision and drainage perirectal abscess 11/14/2011    Procedure: IRRIGATION AND  DEBRIDEMENT PERIRECTAL ABSCESS;  Surgeon: Liz Malady, MD;  Location: [facility] OR;  Service: General;  Laterality: N/A;  . Cholecystectomy 07/30/1999  . Abdominal hysterectomy 04/2007  . Dilation and curettage of uterus 1987   Family History  Problem Relation Age of Onset  . Heart attack Mother   . Lung cancer Mother     metestatic, + smoker  . Aneurysm Mother   . Hyperlipidemia Mother   . Hypertension Mother   . Hypertension Father    History   Social History  . Marital  Status: Married    Spouse Name: N/A    Number of Children: N/A  . Years of Education: N/A   Occupational History  . Not on file.   Social History Main Topics  . Smoking status: Never Smoker   . Smokeless tobacco: Never Used  . Alcohol Use: Yes     05/18/12 "have a taste of a drink maybe once/yr"  . Drug Use: No  . Sexually Active: Not Currently   Other Topics Concern  . Not on file   Social History Narrative   Currently unemployed. Starting college in Aug for accounting.  Married, has 2 children lives with her youngest son.      Review of Systems: all other ROS negative except as noted in the HPI  Physical Exam: Blood pressure 123/87, pulse 58, temperature 98.6 F (37 C), temperature source Oral, resp. rate 20, height 5\' 1"  (1.549 m), weight 254 lb 12.8 oz (115.577 kg), last menstrual period 06/17/2007, SpO2 97.00%. Physical Exam Blood pressure 123/87, pulse 58, temperature 98.6 F (37 C), temperature source Oral, resp. rate 20, height 5\' 1"  (1.549 m), weight 254 lb 12.8 oz (115.577 kg), last menstrual period 06/17/2007, SpO2 97.00%. General:  No acute distress, alert and oriented x 3, well-appearing  HEENT:  Pupils appear unequal but round and reactive to light. R is about 7mm, L is 6mm. Equally reactive. No nystagmus noted. EOMI, no lymphadenopathy, moist mucous membranes, upper dentures in place Cardiovascular:  Regular rate and rhythm, no murmurs, rubs or gallops Respiratory:  Clear to auscultation bilaterally, no wheezes, rales, or rhonchi Abdomen:  Soft, obese, nondistended, nontender, normoactive bowel sounds Extremities:  Warm and well-perfused, no clubbing, cyanosis, or edema.  Skin: Warm, dry, no rashes Neuro: Not anxious appearing, no depressed mood, normal affect Strength 5/5 upper and lower extremities Cranial nerves in tact, no facial muscle asymmetry Patient has good balance with eyes closed, no pronator drift   Lab results: Cardiac Enzymes:  Basename  05/18/12 1114  CKTOTAL 29  CKMB 1.0  CKMBINDEX --  TROPONINI <0.30   CBG:  Basename 05/18/12 1354  GLUCAP 152*   Thyroid Function Tests: Lab Results  Component Value Date   TSH 8.195* 01/04/2012    Misc. Labs: Lab Results  Component Value Date   WBC 8.5 02/21/2012   HGB 13.6 02/21/2012   HCT 39.3 02/21/2012   MCV 88.3 02/21/2012   PLT 236 02/21/2012   Lab Results  Component Value Date   HGBA1C 6.0* 05/26/2011   Lab Results  Component Value Date   CREATININE 0.98 02/21/2012   BUN 14 02/21/2012   NA 139 02/21/2012   K 3.6 02/21/2012   CL 103 02/21/2012   CO2 26 02/21/2012    Imaging results:  No results found.   Assessment & Plan by Problem: Active Problems:  * No active hospital problems. *   [name] is a 51 yo with a history of atypical chest pain (  neg cardiolite at [facility] 2007), HTN, and hypothryoidism who presents from clinic with vertigo and lightheadedness and a 3 week history of chest pain episodes.  1. Episode of Dizziness/Chest pain -no evidence of ACS, cardiac ischemia Etiology unclear but DDX includes vertigo, anxiety/panic attacks, arrhythmia, stroke, vasovagal syncope, orthostatic hypotension, TIA, anemia, cardiac ischemia. Suspect that these episodes most likely related to anxiety/family stressors in patients life. Neuro exam normal except asymmetric pupils  -admit to hospital for observation with telemetry -cycle CE x 3 -repeat EKG tomorrow morning -cont repeat neuro exam -will give IV fluids at 75cc/hr  Hypothyroidism -TSH from clinic elevated -will increase synthroid from 50 to  DVT PPX -heparin   Nutrition -regular diet   Signed: Denton Ar 05/18/2012, 6:56 PM

## 2012-05-18 NOTE — Progress Notes (Signed)
05/18/12 1600 - Attempted to start NSL with 22G angiocath in left forearm; unable to flush site with NS. Catheter removed; site unremarkable.

## 2012-05-18 NOTE — Progress Notes (Signed)
Subjective:   Patient ID: [name] female   DOB: September 17, 1961 51 y.o.   MRN: [id number]  HPI: [name] is a 51 y.o. women with a past medical history for atypical chest pain with Negative Cardiolite at [facility] cards 11/07, Obesity, HTN, HLD, hypothyroidism, panic disorder, presents to the outpatient clinic for follow up visit and medications refill.  1. Patient states that she has has chest pain, SOB, fatigue for past 2 weeks.  She reports right sided chest pain/pressure 9/10, sharp/dull/ "squeeze pain", radiation to right shoulder, lasting for 5-20 minutes, accompanied with SOB and palpitation at times. Patient reports activity can aggravate her chest pain, but sometimes she has chest pain while resting. Resting alleviates pain. She noticed elevated BP ranges at 140's -165/95. Her last chest pain just happened when she walked from the parting lot to the clinic. She does not have chest pain now. No sick contact or long distance travel. Denies heart burn.  She also reports feeling shortness, fatigue and tiredness for past two weeks. She used to be able to walk for few blocks, do house works and climb stairs without problems. She states that she can only walk 1/2 block before she has to stop and catch her breath. And she is unable to do house work due to her SOB.   Patient was hospitalized in August, 2013 for similar problem of DOE and chest pain.  She has had negative cardiac workups including cardiac enzymes, EKG, echo and myoview.  Her D-Dimer was negative as well. She was noted to have elevated TSH level and was started on Synthroid 50 Mcg.    2. Hypothyroidism Diagnosed 2008 via TSH, no treatment/surgery pursued, patient started on synthroid 125 mcg at that time, took the medication for 2-3 months, then was untreated until 05/2011, when she was restarted on synthroid 50. Patient reports full compliance with this medication, stating that it is now less expensive than it previously  had been. She reports an increase in energy level since starting this medication.  She was seen by Dr.Tobbia on 01/04/12 and Dr. Dierdre Searles on 4/10 for cough and strep throat, and her repeated TSH ~8 with normal Free T4 0.96. Her medication was not adjusted. And she was instructed to come back in 2 weeks after her strep throat resolves to follow up on her Thyroid function. But she did not follow up.   Review of Systems:  Constitutional:  Denies fever, chills, diaphoresis, appetite change and positive for tiredness and fatigue.   HEENT:  Denies congestion, sore throat, rhinorrhea, sneezing, mouth sores, trouble swallowing, neck pain   Respiratory:  Positive for SOB, DOE, denies cough, and wheezing.   Cardiovascular:  Positive for palpitations and chest pain. Denies leg swelling.   Gastrointestinal:  Denies nausea, vomiting, abdominal pain, diarrhea, constipation, blood in stool and abdominal distention.   Genitourinary:  Denies dysuria, urgency, frequency, hematuria, flank pain and difficulty urinating.   Musculoskeletal:  Denies myalgias, back pain, joint swelling, arthralgias and gait problem.   Skin:  Denies pallor, rash and wound.   Neurological:  Denies dizziness, seizures, syncope, weakness, light-headedness, numbness and headaches.    .    Past Medical History  Diagnosis Date  . Hypertriglyceridemia     164 - 2/11  . Panic disorder 11/05  . Obesity     250 lbs 8/11  . Atypical chest pain     Negative cardiolite at [facility] cards 11/07  . Anemia, iron deficiency     Requried  2u prbc 6/09, menometrorrhagia, uterine fibroids.  . Hypothyroidism   . Insomnia   . Allergic rhinitis   . Elevated BP    Current Outpatient Prescriptions  Medication Sig Dispense Refill  . levothyroxine (SYNTHROID, LEVOTHROID) 50 MCG tablet Take 50 mcg by mouth daily.        Marland Kitchen PARoxetine (PAXIL) 20 MG tablet Take 1 tablet (20 mg total) by mouth daily.  30 tablet  2   Family History  Problem Relation Age of Onset   . Heart attack Mother   . Lung cancer Mother     metestatic, + smoker  . Aneurysm Mother   . Hyperlipidemia Mother   . Hypertension Mother   . Hypertension Father    History   Social History  . Marital Status: Married    Spouse Name: N/A    Number of Children: N/A  . Years of Education: N/A   Social History Main Topics  . Smoking status: Never Smoker   . Smokeless tobacco: Never Used  . Alcohol Use: No  . Drug Use: No  . Sexually Active: Yes   Other Topics Concern  . None   Social History Narrative   Currently unemployed. Starting college in Aug for accounting.  Married, has 2 children lives with her youngest son.     Review of Systems: See HPI Objective:  Physical Exam: Filed Vitals:   05/18/12 1021  BP: 130/78  Pulse: 65  Temp: 97 F (36.1 C)  TempSrc: Oral  Height: 5\' 1"  (1.549 m)  Weight: 264 lb 3.2 oz (119.84 kg)  SpO2: 98%   General: morbid obesity Head: normocephalic and atraumatic.  Eyes: vision grossly intact, pupils equal, pupils round, pupils reactive to light, no injection and anicteric.  Mouth: pharynx pink and moist, no erythema, and no exudates.  Neck: supple, full ROM, no thyromegaly, unable to assess JVD due to body habitus,and no carotid bruits.  Lungs: normal respiratory effort, no accessory muscle use, normal breath sounds, no crackles, and no wheezes. Heart: normal rate, regular rhythm, no murmur, no gallop, and no rub.  Abdomen: soft, non-tender, normal bowel sounds, no distention, no guarding, no rebound tenderness, no hepatomegaly, and no splenomegaly.  Msk: no joint swelling, no joint warmth, and no redness over joints.  Pulses: 2+ DP/PT pulses bilaterally Extremities: No cyanosis, clubbing, edema Neurologic: alert & oriented X3, cranial nerves II-XII intact, strength normal in all extremities, sensation intact to light touch, and gait normal.  Skin: turgor normal and no rashes.  Psych: Oriented X3, memory intact for recent and  remote, normally interactive, good eye contact, not anxious appearing, and not depressed appearing.   Assessment & Plan:  Addendum  Dizziness and vertigo. Patient complains of sudden onset of dizziness and vertigo 2 minutes after her lunch, accompanied with  Mild blurred vision and mild right sided chest pain/pressure.  Endorses bilateral lower extremity generalized weakness.  Denies speech difficulties. Denies headache, double vision, numbness and tingling. Denies shortness breath or palpitation.  Denies nausea, vomiting or abdominal pain.  No seizure activities noted.  No loss of consciousness.  Blood pressure 130s/ 80s to 140s/90s.  Orthostatic signs negative.  Sitting 118/70, HR 70.  Standing 138/74, HR 72. Dix-Hallpike maneuver negative   General : NAD. Closes eyes Lungs CTAB HEART: RRR. No M/G/R Abd: central obesity. BS x 4, soft. NT Ext: No edema Neuro:  Mental Status: A&O x 3, communicating freely CN: B/L pupils mild dilated 4mm. PERRLA.  Sensation: SILT in UE/LE b/l. Strength:  5/5 except for 4/5 on B/L, however, I feel that she "gives in" When I performed LE strength exam. Reflexes: Normal.  A/P - discuss with Dr. Criselda Peaches - will admit her for presyncope. - oncall resident called.

## 2012-05-18 NOTE — Progress Notes (Signed)
IV team unable to get IV; MD made aware, told to hold off on IV and IVF and encourage pt to drink PO fluids

## 2012-05-18 NOTE — Progress Notes (Signed)
Report was called to nurse on [address]..  Pt transported to room [address] 23 via wheelchair.  Angelina Ok, RN 05/18/2012 4:29 PM.

## 2012-05-18 NOTE — Assessment & Plan Note (Addendum)
Reports compliance with her medications. C/o tiredness and fatigue for 2 weeks.    - TSH - Free T4

## 2012-05-19 ENCOUNTER — Encounter: Payer: Self-pay | Admitting: Internal Medicine

## 2012-05-19 DIAGNOSIS — E669 Obesity, unspecified: Secondary | ICD-10-CM

## 2012-05-19 DIAGNOSIS — R079 Chest pain, unspecified: Secondary | ICD-10-CM | POA: Diagnosis present

## 2012-05-19 LAB — CBC
MCH: 29.9 pg (ref 26.0–34.0)
MCV: 88.3 fL (ref 78.0–100.0)
Platelets: 227 10*3/uL (ref 150–400)
RBC: 4.45 MIL/uL (ref 3.87–5.11)

## 2012-05-19 LAB — CARDIAC PANEL(CRET KIN+CKTOT+MB+TROPI)
Relative Index: INVALID (ref 0.0–2.5)
Total CK: 27 U/L (ref 7–177)
Troponin I: <0.3 ng/mL (ref <0.30)

## 2012-05-19 LAB — COMPREHENSIVE METABOLIC PANEL
ALT: 14 U/L (ref 0–35)
AST: 18 U/L (ref 0–37)
Albumin: 3.4 g/dL — ABNORMAL LOW (ref 3.5–5.2)
Chloride: 103 mEq/L (ref 96–112)
Creatinine, Ser: 1.03 mg/dL (ref 0.50–1.10)
Potassium: 3.8 mEq/L (ref 3.5–5.1)
Sodium: 139 mEq/L (ref 135–145)
Total Bilirubin: 0.4 mg/dL (ref 0.3–1.2)

## 2012-05-19 MED ORDER — LEVOTHYROXINE SODIUM 50 MCG PO TABS: 50.0000 ug | ORAL_TABLET | Freq: Every day | ORAL | Status: DC

## 2012-05-19 MED ORDER — PAROXETINE HCL 20 MG PO TABS
20.0000 mg | ORAL_TABLET | Freq: Every day | ORAL | Status: DC
  Administered 2012-05-19: 20 mg via ORAL
  Filled 2012-05-19: qty 1

## 2012-05-19 MED ORDER — LEVOTHYROXINE SODIUM 75 MCG PO TABS: 75.0000 ug | ORAL_TABLET | Freq: Every day | ORAL | Status: DC

## 2012-05-19 MED ORDER — LEVOTHYROXINE SODIUM 75 MCG PO TABS
75.0000 ug | ORAL_TABLET | Freq: Every day | ORAL | Status: DC
  Filled 2012-05-19: qty 1

## 2012-05-19 NOTE — Progress Notes (Signed)
Subjective:  Patient is doing well this morning, symptoms completely resolved. No chest pain, SOB, dizziness, nausea or vomiting. She states again that she thinks this is related to stress at home and that she "needs to get away" with her husband to de-stress.  Of note, patient states that her pupils have always been unequal and she has noticed the right being bigger than the left for years.   Objective: Vital signs in last 24 hours: Filed Vitals:   05/18/12 1730 05/18/12 2100 05/19/12 0500  BP: 123/87 156/85 151/71  Pulse: 58 66 54  Temp: 98.6 F (37 C) 98.1 F (36.7 C) 98.3 F (36.8 C)  TempSrc: Oral Oral Oral  Resp: 20    Height: 5\' 1"  (1.549 m)    Weight: 254 lb 12.8 oz (115.577 kg)    SpO2: 97% 100% 99%   Weight change:  No intake or output data in the 24 hours ending 05/19/12 0731  Physical Exam Blood pressure 152/71, pulse 64, temperature 98.3 F (36.8 C), temperature source Oral, resp. rate 20, height 5\' 1"  (1.549 m), weight 254 lb 12.8 oz (115.577 kg), last menstrual period 06/17/2007, SpO2 99.00%. General:  No acute distress, alert and oriented x 3, well-appearing  HEENT:  Pupils round and reactive R>L, EOMI, no lymphadenopathy, moist mucous membranes Cardiovascular:  Regular rate and rhythm, no murmurs, rubs or gallops Respiratory:  Clear to auscultation bilaterally, no wheezes, rales, or rhonchi Abdomen:  Soft, nondistended, nontender, normoactive bowel sounds Extremities:  Warm and well-perfused, no clubbing, cyanosis, or edema.  Skin: Warm, dry, no rashes Neuro: Not anxious appearing, no depressed mood, normal affect  Lab Results: Basic Metabolic Panel:  Lab 05/19/12 2130 05/18/12 2010  NA 139 142  K 3.8 3.9  CL 103 105  CO2 26 27  GLUCOSE 110* 129*  BUN 12 14  CREATININE 1.03 1.03  CALCIUM 9.1 9.2  MG -- --  PHOS -- --   Liver Function Tests:  Lab 05/19/12 0206 05/18/12 2010  AST 18 17  ALT 14 17  ALKPHOS 86 91  BILITOT 0.4 0.4  PROT 7.2 7.5    ALBUMIN 3.4* 3.6     Lab 05/19/12 0206 05/18/12 2010  WBC 10.5 9.1  NEUTROABS -- 7.0  HGB 13.3 13.5  HCT 39.3 40.4  MCV 88.3 89.4  PLT 227 229   Cardiac Enzymes:  Lab 05/19/12 0206 05/18/12 2010 05/18/12 1114  CKTOTAL 27 25 29   CKMB 0.9 1.1 1.0  CKMBINDEX -- -- --  TROPONINI <0.30 <0.30 <0.30     Lab 05/18/12 1354  GLUCAP 152*     Lab 05/18/12 1114  TSH 7.451*  T4TOTAL --  FREET4 0.75*  T3FREE --  THYROIDAB --     Micro Results: Recent Results (from the past 240 hour(s))  MRSA PCR SCREENING     Status: Abnormal   Collection Time   05/18/12  5:14 PM      Component Value Range Status Comment   MRSA by PCR POSITIVE (*) NEGATIVE Final    Studies/Results: No results found. Medications: I have reviewed the patient's current medications. Scheduled Meds:   . Chlorhexidine Gluconate Cloth  6 each Topical Q0600  . heparin  5,000 Units Subcutaneous Q8H  . mupirocin ointment  1 application Nasal BID  . sodium chloride  3 mL Intravenous Q12H   Continuous Infusions:   . sodium chloride     PRN Meds:.acetaminophen, acetaminophen, alum & mag hydroxide-simeth, ondansetron (ZOFRAN) IV, ondansetron, DISCONTD:  morphine injection Assessment/Plan:  51 yo female with history of atypical chest pain and hypothyroidism who presents with chest pain and dizziness from the outpatient clinic.  Episode of Dizziness/Chest pain - Etiology unclear. EKG with bradycardia, no STE, TWI, STD, cardiac enzymes negative, orthostatic testing negative, no nystagmus. No signs of dysrhythmia. Chest pain now resolved, no further dizziness. Suspect anxiety/panic attack vs. Vasovagal syncope.  -cardiology consult: appreciate recs, no further cardiac work up needed before d/c -no need for imaging at this time, stable neuro exam -no events on telemetry over night. EKG stable. -d/c with ASA 81mg   Hypothyroidism  -TSH 7.4 -will increase synthroid from 50 to \ -f/u as outpatient with repeat  TSH  DVT PPX  -heparin Liscomb   Nutrition  -regular diet  Dispo -patient is stable and no further evaluation needed -able to dc to home      LOS: 1 day   Denton Ar 05/19/2012, 7:31 AM

## 2012-05-19 NOTE — H&P (Signed)
INTERNAL MEDICINE TEACHING SERVICE Attending Admission Note  Date: 05/19/2012  Patient name: [name]  Medical record number: [id number]  Date of birth: 21-Mar-1961    I have seen and evaluated [name] and discussed their care with the Residency Team.  50 yr. Old WF w/ pmhx significant for HTN, HL, morbid obesity with Body mass index is 48.14 kg/(m^2)., anxiety, iron def anemia, hypothyroidism, presented with dizziness. The patient presented to outpatient clinic stating that she had an episode of vertigo as she was walking from the restroom back to her room. She had an episode of associated CP, tingling, nausea, and blurry vision.  She admits to episodes of CP and DOE over the past week weeks. The episodes of CP occur both with exertion and at rest. She states that she took NTG yesterday that improved her CP. Overnight she denies any CP, SOB. She has r/o for ACS. She has not had any evidence of orthostatic hypotension. She has had a MPS on 05/2011 that was negative for ischemia and with no wall motion abnormalities.  Physical Exam: Blood pressure 152/71, pulse 64, temperature 98.3 F (36.8 C), temperature source Oral, resp. rate 20, height 5\' 1"  (1.549 m), weight 254 lb 12.8 oz (115.577 kg), last menstrual period 06/17/2007, SpO2 99.00%.  General: Vital signs reviewed and noted. Well-developed, well-nourished, in no acute distress; alert, appropriate and cooperative throughout examination.  Head: Normocephalic, atraumatic.  Eyes: PERRL, EOMI, No signs of anemia or jaundince.  Nose: Mucous membranes moist, not inflammed, nonerythematous.  Throat: Oropharynx nonerythematous, no exudate appreciated.   Neck: No deformities, masses, or tenderness noted.Supple, No carotid Bruits, no JVD.  Lungs:  Normal respiratory effort. Clear to auscultation BL without crackles or wheezes.  Heart: RRR. S1 and S2 normal without gallop, murmur, or rubs.  Abdomen:  BS normoactive. Soft, Nondistended,  non-tender.  No masses or organomegaly.  Extremities: No pretibial edema.  Neurologic: A&O X3, CN II - XII are grossly intact. Motor strength is 5/5 in the all 4 extremities, Sensations intact to light touch, Cerebellar signs negative.  Skin: No visible rashes, scars.    Lab results: Results for orders placed during the hospital encounter of 05/18/12 (from the past 24 hour(s))  MRSA PCR SCREENING     Status: Abnormal   Collection Time   05/18/12  5:14 PM      Component Value Range   MRSA by PCR POSITIVE (*) NEGATIVE  CBC     Status: Normal   Collection Time   05/18/12  8:10 PM      Component Value Range   WBC 9.1  4.0 - 10.5 K/uL   RBC 4.52  3.87 - 5.11 MIL/uL   Hemoglobin 13.5  12.0 - 15.0 g/dL   HCT 84.6  96.2 - 95.2 %   MCV 89.4  78.0 - 100.0 fL   MCH 29.9  26.0 - 34.0 pg   MCHC 33.4  30.0 - 36.0 g/dL   RDW 84.1  32.4 - 40.1 %   Platelets 229  150 - 400 K/uL  COMPREHENSIVE METABOLIC PANEL     Status: Abnormal   Collection Time   05/18/12  8:10 PM      Component Value Range   Sodium 142  135 - 145 mEq/L   Potassium 3.9  3.5 - 5.1 mEq/L   Chloride 105  96 - 112 mEq/L   CO2 27  19 - 32 mEq/L   Glucose, Bld 129 (*) 70 - 99 mg/dL  BUN 14  6 - 23 mg/dL   Creatinine, Ser 1.61  0.50 - 1.10 mg/dL   Calcium 9.2  8.4 - 09.6 mg/dL   Total Protein 7.5  6.0 - 8.3 g/dL   Albumin 3.6  3.5 - 5.2 g/dL   AST 17  0 - 37 U/L   ALT 17  0 - 35 U/L   Alkaline Phosphatase 91  39 - 117 U/L   Total Bilirubin 0.4  0.3 - 1.2 mg/dL   GFR calc non Af Amer 63 (*) >90 mL/min   GFR calc Af Amer 73 (*) >90 mL/min  DIFFERENTIAL     Status: Normal   Collection Time   05/18/12  8:10 PM      Component Value Range   Neutrophils Relative 77  43 - 77 %   Neutro Abs 7.0  1.7 - 7.7 K/uL   Lymphocytes Relative 16  12 - 46 %   Lymphs Abs 1.5  0.7 - 4.0 K/uL   Monocytes Relative 5  3 - 12 %   Monocytes Absolute 0.5  0.1 - 1.0 K/uL   Eosinophils Relative 2  0 - 5 %   Eosinophils Absolute 0.1  0.0 - 0.7 K/uL    Basophils Relative 0  0 - 1 %   Basophils Absolute 0.0  0.0 - 0.1 K/uL  CARDIAC PANEL(CRET KIN+CKTOT+MB+TROPI)     Status: Normal   Collection Time   05/18/12  8:10 PM      Component Value Range   Total CK 25  7 - 177 U/L   CK, MB 1.1  0.3 - 4.0 ng/mL   Troponin I <0.30  <0.30 ng/mL   Relative Index RELATIVE INDEX IS INVALID  0.0 - 2.5  COMPREHENSIVE METABOLIC PANEL     Status: Abnormal   Collection Time   05/19/12  2:06 AM      Component Value Range   Sodium 139  135 - 145 mEq/L   Potassium 3.8  3.5 - 5.1 mEq/L   Chloride 103  96 - 112 mEq/L   CO2 26  19 - 32 mEq/L   Glucose, Bld 110 (*) 70 - 99 mg/dL   BUN 12  6 - 23 mg/dL   Creatinine, Ser 0.45  0.50 - 1.10 mg/dL   Calcium 9.1  8.4 - 40.9 mg/dL   Total Protein 7.2  6.0 - 8.3 g/dL   Albumin 3.4 (*) 3.5 - 5.2 g/dL   AST 18  0 - 37 U/L   ALT 14  0 - 35 U/L   Alkaline Phosphatase 86  39 - 117 U/L   Total Bilirubin 0.4  0.3 - 1.2 mg/dL   GFR calc non Af Amer 63 (*) >90 mL/min   GFR calc Af Amer 73 (*) >90 mL/min  CBC     Status: Normal   Collection Time   05/19/12  2:06 AM      Component Value Range   WBC 10.5  4.0 - 10.5 K/uL   RBC 4.45  3.87 - 5.11 MIL/uL   Hemoglobin 13.3  12.0 - 15.0 g/dL   HCT 81.1  91.4 - 78.2 %   MCV 88.3  78.0 - 100.0 fL   MCH 29.9  26.0 - 34.0 pg   MCHC 33.8  30.0 - 36.0 g/dL   RDW 95.6  21.3 - 08.6 %   Platelets 227  150 - 400 K/uL  CARDIAC PANEL(CRET KIN+CKTOT+MB+TROPI)     Status: Normal   Collection Time  05/19/12  2:06 AM      Component Value Range   Total CK 27  7 - 177 U/L   CK, MB 0.9  0.3 - 4.0 ng/mL   Troponin I <0.30  <0.30 ng/mL   Relative Index RELATIVE INDEX IS INVALID  0.0 - 2.5  CARDIAC PANEL(CRET KIN+CKTOT+MB+TROPI)     Status: Normal   Collection Time   05/19/12 12:00 PM      Component Value Range   Total CK 27  7 - 177 U/L   CK, MB 0.9  0.3 - 4.0 ng/mL   Troponin I <0.30  <0.30 ng/mL   Relative Index RELATIVE INDEX IS INVALID  0.0 - 2.5    Imaging results:  No results  found.   Assessment and Plan: I agree with the formulated Assessment and Plan with the following changes: 50 yr. Old WF w/ pmhx significant for HTN, HL, morbid obesity with Body mass index is 48.14 kg/(m^2)., anxiety, iron def anemia, hypothyroidism, presented with dizziness and CP. 1) CP: She has had negative MPS in past. I would ask cardiology for opinion regarding CP relieved with NTG. CE's negative x3. EKG without significant ST changes. Risk factor modificant (HL, HTN, HbA1c). Denies any dizziness at this time. 2) HTN: Needs outpatient F/U to titrate medications. 3) If Cardiology does not feel she needs MPS or further w/u, may D/C home today.  Jonah Blue, DO 8/8/20131:27 PM

## 2012-05-19 NOTE — Progress Notes (Signed)
D/c orders received; IV removed with gauze on, pt remains in stable condition, pt meds and instructions reviewed and given to pt; pt d/c to home, waiting for ride to arrive

## 2012-05-19 NOTE — Discharge Summary (Signed)
Internal Medicine Teaching [facility] Discharge Note  Name: [name] MRN: [id number] DOB: 07/22/1961 51 y.o.  Date of Admission: 05/18/2012  4:44 PM Date of Discharge: 05/19/2012 Attending Physician: Paige Blue, DO  Discharge Diagnosis: Active Problems:  Chest pain Hypothyroidism Dizziness  Discharge Medications: Medication List  As of 05/19/2012  1:26 PM   ASK your doctor about these medications         levothyroxine 50 MCG tablet   Commonly known as: SYNTHROID, LEVOTHROID   Take 50 mcg by mouth daily.      PARoxetine 20 MG tablet   Commonly known as: PAXIL   Take 20 mg by mouth every morning.            Disposition and follow-up:   [name] was discharged from [facility] in stable condition.  At the hospital follow up visit please address the following issues:  -repeat A1c and risk stratify for CAD -low carb diet and exercise -f/u repeat TSH and adjust synthroid dose accordingly  Follow-up Appointments:  Dr. Ian Jensen at [facility], August 22 at 8:45 AM   Consultations: Treatment Team:  Rounding Lbcardiology, MD  Procedures Performed:  No results found.  Admission HPI: [name] is a 51 yo woman with a history of hypothyroidism, atypical chest pain with negative past work ups), obesity, HTN, HLD, anxiety, chronic iron def anemia, who presents from the IM outpatient clinic with an episode of dizziness during which "the room would not stop spinning". This happened after she ate something and she was walking from the bathroom back to the room. She was also having symptoms of leg weakness and tingling, nausea, blurry vision and maybe some chest pressure. She was helped into a chair and given some water but was still unable to stand without nausea and dizziness so she was referred for hospital admission.  Ms [name] had come in to the clinic with complaints of chest pain and dyspnea on exertion for several  weeks as well as fatigue for 1 month. She states that she has episodes of chest pain at home that occur sometimes with exertion, sometimes at rest. She describes the pain as "pressure" 9/10, radiating to right shoulder. At times, she also has dyspnea. ?maybe some palpitations also, which she calls "flutters".  When we see her, she denies currently having any dizziness or nausea or weakness. No chest pain, no shortness of breath. She states that the episode resolved shortly after arriving in her room. She does state that she had not been drinking much water/fluids today and she has also been under a lot of stress with the goings on at home. Her adult children live with her and there is tension in those relationships at times, and in addition she is concerned about her husband's health. She says she has had an 11lb intentional weight loss since April, she has been walking more. No melena, no BRBPR, no fever or chills, no recent illnesses, no diarrhea, constipation. She states she has been taking synthroid as directed.  Of note, she did have an episode of chest pain/dysnpea yesterday which was relieved when she sat down and took a nitroglycerin SL tablet.   Hospital Course by problem list: Active Problems:  Chest pain  Chest pain: Patient presented from clinic with an episode of chest pain and dizziness as well as some complaints of chest pain at home relieved by nitroglycerin. Orthostatics were tested and negative, EKG was unchanged from previous, and  the episode resolved within several minutes. Cardiac enzymes were cycled and negative x3. During the admission, she did not have any further chest pain. She did have some bradycardia to mid-50s on telemetry and EKG but was asymptomatic, otherwise she was hemodynamically stable throughout admission.. Cardiology evaluated her while in the hospital and recommended no further work up.  Dizziness: Episode of dizziness lasted for several minutes in clinic and was  resolved by admission. Dix-Hallpike maneuver did not produced nystagmus. No further episodes during her hospital stay.  Hypothyroidism: [name] TSH was found to be 7.4 with free T4 of 0.75 and her home dose of synthroid was increased from to QD.   Discharge Vitals:  BP 152/71  Pulse 64  Temp 98.3 F (36.8 C) (Oral)  Resp 20  Ht 5\' 1"  (1.549 m)  Wt 254 lb 12.8 oz (115.577 kg)  BMI 48.14 kg/m2  SpO2 99%  LMP 06/17/2007  Discharge Labs:  Results for orders placed during the hospital encounter of 05/18/12 (from the past 24 hour(s))  MRSA PCR SCREENING     Status: Abnormal   Collection Time   05/18/12  5:14 PM      Component Value Range   MRSA by PCR POSITIVE (*) NEGATIVE  CBC     Status: Normal   Collection Time   05/18/12  8:10 PM      Component Value Range   WBC 9.1  4.0 - 10.5 K/uL   RBC 4.52  3.87 - 5.11 MIL/uL   Hemoglobin 13.5  12.0 - 15.0 g/dL   HCT 16.1  09.6 - 04.5 %   MCV 89.4  78.0 - 100.0 fL   MCH 29.9  26.0 - 34.0 pg   MCHC 33.4  30.0 - 36.0 g/dL   RDW 40.9  81.1 - 91.4 %   Platelets 229  150 - 400 K/uL  COMPREHENSIVE METABOLIC PANEL     Status: Abnormal   Collection Time   05/18/12  8:10 PM      Component Value Range   Sodium 142  135 - 145 mEq/L   Potassium 3.9  3.5 - 5.1 mEq/L   Chloride 105  96 - 112 mEq/L   CO2 27  19 - 32 mEq/L   Glucose, Bld 129 (*) 70 - 99 mg/dL   BUN 14  6 - 23 mg/dL   Creatinine, Ser 7.82  0.50 - 1.10 mg/dL   Calcium 9.2  8.4 - 95.6 mg/dL   Total Protein 7.5  6.0 - 8.3 g/dL   Albumin 3.6  3.5 - 5.2 g/dL   AST 17  0 - 37 U/L   ALT 17  0 - 35 U/L   Alkaline Phosphatase 91  39 - 117 U/L   Total Bilirubin 0.4  0.3 - 1.2 mg/dL   GFR calc non Af Amer 63 (*) >90 mL/min   GFR calc Af Amer 73 (*) >90 mL/min  DIFFERENTIAL     Status: Normal   Collection Time   05/18/12  8:10 PM      Component Value Range   Neutrophils Relative 77  43 - 77 %   Neutro Abs 7.0  1.7 - 7.7 K/uL   Lymphocytes Relative 16  12 - 46 %   Lymphs  Abs 1.5  0.7 - 4.0 K/uL   Monocytes Relative 5  3 - 12 %   Monocytes Absolute 0.5  0.1 - 1.0 K/uL   Eosinophils Relative 2  0 - 5 %  Eosinophils Absolute 0.1  0.0 - 0.7 K/uL   Basophils Relative 0  0 - 1 %   Basophils Absolute 0.0  0.0 - 0.1 K/uL  CARDIAC PANEL(CRET KIN+CKTOT+MB+TROPI)     Status: Normal   Collection Time   05/18/12  8:10 PM      Component Value Range   Total CK 25  7 - 177 U/L   CK, MB 1.1  0.3 - 4.0 ng/mL   Troponin I <0.30  <0.30 ng/mL   Relative Index RELATIVE INDEX IS INVALID  0.0 - 2.5  COMPREHENSIVE METABOLIC PANEL     Status: Abnormal   Collection Time   05/19/12  2:06 AM      Component Value Range   Sodium 139  135 - 145 mEq/L   Potassium 3.8  3.5 - 5.1 mEq/L   Chloride 103  96 - 112 mEq/L   CO2 26  19 - 32 mEq/L   Glucose, Bld 110 (*) 70 - 99 mg/dL   BUN 12  6 - 23 mg/dL   Creatinine, Ser 1.61  0.50 - 1.10 mg/dL   Calcium 9.1  8.4 - 09.6 mg/dL   Total Protein 7.2  6.0 - 8.3 g/dL   Albumin 3.4 (*) 3.5 - 5.2 g/dL   AST 18  0 - 37 U/L   ALT 14  0 - 35 U/L   Alkaline Phosphatase 86  39 - 117 U/L   Total Bilirubin 0.4  0.3 - 1.2 mg/dL   GFR calc non Af Amer 63 (*) >90 mL/min   GFR calc Af Amer 73 (*) >90 mL/min  CBC     Status: Normal   Collection Time   05/19/12  2:06 AM      Component Value Range   WBC 10.5  4.0 - 10.5 K/uL   RBC 4.45  3.87 - 5.11 MIL/uL   Hemoglobin 13.3  12.0 - 15.0 g/dL   HCT 04.5  40.9 - 81.1 %   MCV 88.3  78.0 - 100.0 fL   MCH 29.9  26.0 - 34.0 pg   MCHC 33.8  30.0 - 36.0 g/dL   RDW 91.4  78.2 - 95.6 %   Platelets 227  150 - 400 K/uL  CARDIAC PANEL(CRET KIN+CKTOT+MB+TROPI)     Status: Normal   Collection Time   05/19/12  2:06 AM      Component Value Range   Total CK 27  7 - 177 U/L   CK, MB 0.9  0.3 - 4.0 ng/mL   Troponin I <0.30  <0.30 ng/mL   Relative Index RELATIVE INDEX IS INVALID  0.0 - 2.5  CARDIAC PANEL(CRET KIN+CKTOT+MB+TROPI)     Status: Normal   Collection Time   05/19/12 12:00 PM      Component Value Range    Total CK 27  7 - 177 U/L   CK, MB 0.9  0.3 - 4.0 ng/mL   Troponin I <0.30  <0.30 ng/mL   Relative Index RELATIVE INDEX IS INVALID  0.0 - 2.5    Signed: Denton Ar 05/19/2012, 1:26 PM   Time Spent on Discharge: 30 minutes

## 2012-05-19 NOTE — Consult Note (Signed)
CARDIOLOGY CONSULT NOTE    Patient ID: [name] MRN: [id number] DOB/AGE: Jul 10, 1961 51 y.o.  Admit date: 05/18/2012 Referring Physician:  Kem Kays Primary Physician: Dede Query, MD Primary Cardiologist:  None Reason for Consultation: Chest Pain  Active Problems:  * No active hospital problems. *    HPI:  [name] is a 51 yo woman with a history of hypothyroidism, atypical chest pain with negative past work ups), obesity, HTN, HLD, anxiety, chronic iron def anemia, who presents from the IM outpatient clinic with an episode of dizziness during which "the room would not stop spinning". This happened after she ate something and she was walking from the bathroom back to the room. She was also having symptoms of leg weakness and tingling, nausea, blurry vision and maybe some chest pressure. She was helped into a chair and given some water but was still unable to stand without nausea and dizziness so she was referred for hospital admission.  She had a normal stress test in 2007 and normal stress echo 05/2010.  Patient indicates normal one last year but I cant find records.  She was in clinic with labile BP and got dizzy coming out of bathroom.  Not postural and no arrythmia.  No pain in hospital  Priior to admission atypical sharp right sholder pain.  Currently wants to go home  Previous w/u for dyspnea thought due to obesity, anxiety and thyroid issues   @ROS @ All other systems reviewed and negative except as noted above  Past Medical History  Diagnosis Date  . Hypertriglyceridemia     164 - 2/11  . Panic disorder 11/05  . Obesity     250 lbs 8/11  . Atypical chest pain     Negative cardiolite at [facility] cards 11/07  . Anemia, iron deficiency     Requried 2u prbc 6/09, menometrorrhagia, uterine fibroids.  . Hypothyroidism   . Insomnia   . Allergic rhinitis   . Elevated BP   . Anginal pain   . Shortness of breath 05/18/2012    "at rest; lying down; w/exertion"  . Perirectal abscess  11/14/2011  . CHOLECYSTECTOMY, HX OF 08/31/2006    Annotation: 2000 Qualifier: Diagnosis of  By: Renae Fickle MD, Bhakti      Family History  Problem Relation Age of Onset  . Heart attack Mother   . Lung cancer Mother     metestatic, + smoker  . Aneurysm Mother   . Hyperlipidemia Mother   . Hypertension Mother   . Hypertension Father     History   Social History  . Marital Status: Married    Spouse Name: N/A    Number of Children: N/A  . Years of Education: N/A   Occupational History  . Not on file.   Social History Main Topics  . Smoking status: Never Smoker   . Smokeless tobacco: Never Used  . Alcohol Use: Yes     05/18/12 "have a taste of a drink maybe once/yr"  . Drug Use: No  . Sexually Active: Not Currently   Other Topics Concern  . Not on file   Social History Narrative   Currently unemployed. Starting college in Aug for accounting.  Married, has 2 children lives with her youngest son.      Past Surgical History  Procedure Date  . Incision and drainage perirectal abscess 11/14/2011    Procedure: IRRIGATION AND DEBRIDEMENT PERIRECTAL ABSCESS;  Surgeon: Liz Malady, MD;  Location: [facility] OR;  Service: General;  Laterality:  N/A;  . Cholecystectomy 07/30/1999  . Abdominal hysterectomy 04/2007  . Dilation and curettage of uterus 1987        . Chlorhexidine Gluconate Cloth  6 each Topical Q0600  . heparin  5,000 Units Subcutaneous Q8H  . levothyroxine  75 mcg Oral QAC breakfast  . mupirocin ointment  1 application Nasal BID  . PARoxetine  20 mg Oral Daily  . sodium chloride  3 mL Intravenous Q12H  . DISCONTD: levothyroxine  50 mcg Oral QAC breakfast      . sodium chloride      Physical Exam: Blood pressure 152/71, pulse 64, temperature 98.3 F (36.8 C), temperature source Oral, resp. rate 20, height 5\' 1"  (1.549 m), weight 115.577 kg (254 lb 12.8 oz), last menstrual period 06/17/2007, SpO2 99.00%.   Affect appropriate Obese white female HEENT: normal Neck  supple with no adenopathy JVP normal no bruits no thyromegaly Lungs clear with no wheezing and good diaphragmatic motion Heart:  S1/S2 no murmur, no rub, gallop or click tatoo over right breast PMI normal Abdomen: benighn, BS positve, no tenderness, no AAA no bruit.  No HSM or HJR Distal pulses intact with no bruits No edema Neuro non-focal Skin warm and dry No muscular weakness   Labs:   Lab Results  Component Value Date   WBC 10.5 05/19/2012   HGB 13.3 05/19/2012   HCT 39.3 05/19/2012   MCV 88.3 05/19/2012   PLT 227 05/19/2012    Lab 05/19/12 0206  NA 139  K 3.8  CL 103  CO2 26  BUN 12  CREATININE 1.03  CALCIUM 9.1  PROT 7.2  BILITOT 0.4  ALKPHOS 86  ALT 14  AST 18  GLUCOSE 110*   Lab Results  Component Value Date   CKTOTAL 27 05/19/2012   CKMB 0.9 05/19/2012   TROPONINI <0.30 05/19/2012    Lab Results  Component Value Date   CHOL 202* 05/26/2011   CHOL 181 12/06/2009   CHOL 202* 03/15/2009   Lab Results  Component Value Date   HDL 38* 05/26/2011   HDL 37* 12/06/2009   HDL 38* 03/15/2009   Lab Results  Component Value Date   LDLCALC 137* 05/26/2011   LDLCALC 111* 12/06/2009   LDLCALC 115* 03/15/2009   Lab Results  Component Value Date   TRIG 135 05/26/2011   TRIG 164* 12/06/2009   TRIG 245* 03/15/2009   Lab Results  Component Value Date   CHOLHDL 5.3 05/26/2011   CHOLHDL 4.9 Ratio 12/06/2009   CHOLHDL 5.3 Ratio 03/15/2009   No results found for this basename: LDLDIRECT      Radiology: No results found.  EKG: SB rate 53 normal ECG 05/19/2012  SR rate 62 poor R wave progression 05/18/12    ASSESSMENT AND PLAN:  Chest Pain: atypical R/O previous normal stress tests x2 Normal ECG  Ok to D/C no further w/u indicated Dizzyness:  ? Vagal type reaction after going to bathroom.  No signs of dysrythmia or dysautonomia Hypothyroid:  Continue replacement TSH 7.4 on 8/7  Obesity:  A1c 6.0 8/12 should be repeated as random BS elevated.  Discussed low carb diet  F/U family  practice clinic  Signed: Charlton Haws 05/19/2012, 12:12 PM

## 2012-05-23 NOTE — Discharge Summary (Signed)
INTERNAL MEDICINE TEACHING SERVICE Attending Note  Date: 05/23/2012  Patient name: [name]  Medical record number: [id number]  Date of birth: [birth year]    This patient has been discussed with the house staff. Please see their note for complete details. I concur with their findings and plan.   Jonah Blue, DO  05/23/2012, 4:45 PM

## 2012-06-02 ENCOUNTER — Encounter: Payer: Self-pay | Admitting: Internal Medicine

## 2012-06-18 ENCOUNTER — Encounter (HOSPITAL_COMMUNITY): Payer: Self-pay | Admitting: *Deleted

## 2012-06-18 DIAGNOSIS — R51 Headache: Secondary | ICD-10-CM | POA: Insufficient documentation

## 2012-06-18 DIAGNOSIS — Z79899 Other long term (current) drug therapy: Secondary | ICD-10-CM | POA: Insufficient documentation

## 2012-06-18 DIAGNOSIS — H538 Other visual disturbances: Secondary | ICD-10-CM | POA: Insufficient documentation

## 2012-06-18 DIAGNOSIS — E781 Pure hyperglyceridemia: Secondary | ICD-10-CM | POA: Insufficient documentation

## 2012-06-18 NOTE — ED Notes (Signed)
The pt has had a headache for 2 days and some blurred vision in her lt eye.   She has a history in her family of aneurysms.  Some nausea and vomiting.  No previous history of headaches

## 2012-06-19 ENCOUNTER — Emergency Department (HOSPITAL_COMMUNITY): Payer: Self-pay

## 2012-06-19 ENCOUNTER — Emergency Department (HOSPITAL_COMMUNITY)
Admission: EM | Admit: 2012-06-19 | Discharge: 2012-06-19 | Disposition: A | Discharge status: Home / Self Care | Payer: Self-pay | Attending: Emergency Medicine | Admitting: Emergency Medicine

## 2012-06-19 DIAGNOSIS — R51 Headache: Secondary | ICD-10-CM

## 2012-06-19 LAB — CBC WITH DIFFERENTIAL/PLATELET
Basophils Relative: 0 % (ref 0–1)
Eosinophils Absolute: 0.2 10*3/uL (ref 0.0–0.7)
HCT: 39.2 % (ref 36.0–46.0)
Hemoglobin: 13.3 g/dL (ref 12.0–15.0)
MCH: 30.4 pg (ref 26.0–34.0)
MCHC: 33.9 g/dL (ref 30.0–36.0)
Monocytes Absolute: 0.4 10*3/uL (ref 0.1–1.0)
Monocytes Relative: 4 % (ref 3–12)

## 2012-06-19 LAB — POCT I-STAT, CHEM 8
BUN: 11 mg/dL (ref 6–23)
Calcium, Ion: 1.16 mmol/L (ref 1.12–1.23)
Creatinine, Ser: 1.2 mg/dL — ABNORMAL HIGH (ref 0.50–1.10)
TCO2: 25 mmol/L (ref 0–100)

## 2012-06-19 MED ORDER — HYDROMORPHONE HCL PF 1 MG/ML IJ SOLN
0.5000 mg | Freq: Once | INTRAMUSCULAR | Status: DC
  Filled 2012-06-19: qty 1

## 2012-06-19 NOTE — ED Notes (Signed)
Pt states she feels much better. Denies HA at this time. States her vision seems like her normal at this point.

## 2012-06-19 NOTE — ED Notes (Signed)
Pt in route to CT scanner 

## 2012-06-19 NOTE — ED Provider Notes (Signed)
History     CSN: [id number]  Arrival date & time 06/18/12  2000   First MD Initiated Contact with Patient 06/19/12 [phone]      Chief Complaint  Patient presents with  . Headache    (Consider location/radiation/quality/duration/timing/severity/associated sxs/prior treatment) HPI Comments: Patient is complaining of a headache that starts in the back, radiates to behind the left eye.  It is been persistent for the past 3, days.  She also states she has blurry vision in the left eye, which has been through the duration of the headache.  She has taken over-the-counter medication without resolution.  She is very concerned because both her mother, and brother had cerebral aneurysms.  Both dying from same.  She states she tried calling her physician and could not get through to set an appointment for evaluation  Patient is a 52 y.o. female presenting with headaches. The history is provided by the patient.  Headache  This is a new problem. The current episode started more than 2 days ago. The problem occurs constantly. The headache is associated with nothing. The pain is located in the left unilateral region. The quality of the pain is described as throbbing. The pain is at a severity of 5/10. The pain is moderate. Pertinent negatives include no fever and no nausea.    Past Medical History  Diagnosis Date  . Hypertriglyceridemia     164 - 2/11  . Panic disorder 11/05  . Obesity     250 lbs 8/11  . Atypical chest pain     Negative cardiolite at [facility] cards 11/07  . Anemia, iron deficiency     Requried 2u prbc 6/09, menometrorrhagia, uterine fibroids.  . Hypothyroidism   . Insomnia   . Allergic rhinitis   . Elevated BP   . Anginal pain   . Shortness of breath 05/18/2012    "at rest; lying down; w/exertion"  . Perirectal abscess 11/14/2011  . CHOLECYSTECTOMY, HX OF 08/31/2006    Annotation: 2000 Qualifier: Diagnosis of  By: Renae Fickle MD, Bhakti      Past Surgical History  Procedure Date  .  Incision and drainage perirectal abscess 11/14/2011    Procedure: IRRIGATION AND DEBRIDEMENT PERIRECTAL ABSCESS;  Surgeon: Liz Malady, MD;  Location: [facility] OR;  Service: General;  Laterality: N/A;  . Cholecystectomy 07/30/1999  . Abdominal hysterectomy 04/2007  . Dilation and curettage of uterus 1987    Family History  Problem Relation Age of Onset  . Heart attack Mother   . Lung cancer Mother     metestatic, + smoker  . Aneurysm Mother   . Hyperlipidemia Mother   . Hypertension Mother   . Hypertension Father     History  Substance Use Topics  . Smoking status: Never Smoker   . Smokeless tobacco: Never Used  . Alcohol Use: Yes     05/18/12 "have a taste of a drink maybe once/yr"    OB History    Grav Para Term Preterm Abortions TAB SAB Ect Mult Living                  Review of Systems  Constitutional: Negative for fever and chills.  HENT: Negative for ear pain, congestion and neck pain.   Eyes: Positive for visual disturbance. Negative for pain.  Gastrointestinal: Negative for nausea.  Skin: Negative for rash.  Neurological: Positive for headaches. Negative for dizziness, weakness and numbness.    Allergies  Codeine; Doxycycline; Hydrocodone; Oxycodone; and Sulfamethoxazole w-trimethoprim  Home  Medications   Current Outpatient Rx  Name Route Sig Dispense Refill  . LEVOTHYROXINE SODIUM 75 MCG PO TABS Oral Take 1 tablet (75 mcg total) by mouth daily before breakfast. 30 tablet 1  . PAROXETINE HCL 20 MG PO TABS Oral Take 20 mg by mouth every morning.      BP 166/68  Pulse 71  Temp 97.8 F (36.6 C) (Oral)  Resp 18  SpO2 100%  LMP 06/17/2007  Physical Exam  Constitutional: She is oriented to person, place, and time. She appears well-developed and well-nourished.       Morbidly obese  HENT:  Head: Normocephalic and atraumatic.  Right Ear: External ear normal.  Left Ear: External ear normal.  Nose: Right sinus exhibits no maxillary sinus tenderness and no  frontal sinus tenderness. Left sinus exhibits no maxillary sinus tenderness and no frontal sinus tenderness.  Mouth/Throat: Uvula is midline.  Eyes: Pupils are equal, round, and reactive to light. Right eye exhibits no discharge. Left eye exhibits no discharge. Right conjunctiva is not injected. Left conjunctiva is not injected. Left conjunctiva has no hemorrhage. No scleral icterus. Right eye exhibits normal extraocular motion and no nystagmus. Left eye exhibits normal extraocular motion and no nystagmus.  Neck: Normal range of motion.  Cardiovascular: Normal rate.   Pulmonary/Chest: Effort normal.  Musculoskeletal: Normal range of motion.  Neurological: She is alert and oriented to person, place, and time.  Skin: Skin is warm. No rash noted. No pallor.    ED Course  Procedures (including critical care time)  Labs Reviewed  POCT I-STAT, CHEM 8 - Abnormal; Notable for the following:    Creatinine, Ser 1.20 (*)     All other components within normal limits  CBC WITH DIFFERENTIAL  PROTIME-INR   Ct Head Wo Contrast  06/19/2012  *RADIOLOGY REPORT*  Clinical Data: Posterior and left-sided headaches and blurry vision.  CT HEAD WITHOUT CONTRAST  Technique:  Contiguous axial images were obtained from the base of the skull through the vertex without contrast.  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  The ventricles and sulci are symmetrical without significant effacement, displacement, or dilatation. No mass effect or midline shift. No abnormal extra-axial fluid collections. The grey-white matter junction is distinct. Basal cisterns are not effaced. No acute intracranial hemorrhage. No depressed skull fractures. Visualized paranasal sinuses and mastoid air cells are not opacified.  No significant changes since previous study.  IMPRESSION: No acute intracranial abnormalities.   Original Report Authenticated By: Marlon Pel, M.D.      1. Headache        MDM  Patient's biggest concern is that she has an aneurysm, like her family, had CT has been ordered and is negative.  Recommend follow up with her primary care physician         Arman Filter, NP 06/19/12 0422  Arman Filter, NP 06/19/12 0423

## 2012-06-19 NOTE — ED Provider Notes (Signed)
Medical screening examination/treatment/procedure(s) were performed by non-physician practitioner and as supervising physician I was immediately available for consultation/collaboration.  Melbert Botelho K Skylynn Burkley-Rasch, MD 06/19/12 0440 

## 2012-06-23 ENCOUNTER — Encounter: Payer: Self-pay | Admitting: Internal Medicine

## 2012-06-26 ENCOUNTER — Emergency Department (HOSPITAL_COMMUNITY)
Admission: EM | Admit: 2012-06-26 | Discharge: 2012-06-27 | Disposition: A | Discharge status: Home / Self Care | Payer: Self-pay | Attending: Emergency Medicine | Admitting: Emergency Medicine

## 2012-06-26 ENCOUNTER — Encounter (HOSPITAL_COMMUNITY): Payer: Self-pay | Admitting: *Deleted

## 2012-06-26 ENCOUNTER — Emergency Department (HOSPITAL_COMMUNITY): Payer: Self-pay

## 2012-06-26 DIAGNOSIS — K5900 Constipation, unspecified: Secondary | ICD-10-CM | POA: Insufficient documentation

## 2012-06-26 DIAGNOSIS — D509 Iron deficiency anemia, unspecified: Secondary | ICD-10-CM | POA: Insufficient documentation

## 2012-06-26 DIAGNOSIS — E039 Hypothyroidism, unspecified: Secondary | ICD-10-CM | POA: Insufficient documentation

## 2012-06-26 DIAGNOSIS — R809 Proteinuria, unspecified: Secondary | ICD-10-CM | POA: Insufficient documentation

## 2012-06-26 DIAGNOSIS — R109 Unspecified abdominal pain: Secondary | ICD-10-CM

## 2012-06-26 DIAGNOSIS — G4700 Insomnia, unspecified: Secondary | ICD-10-CM | POA: Insufficient documentation

## 2012-06-26 LAB — COMPREHENSIVE METABOLIC PANEL
BUN: 10 mg/dL (ref 6–23)
CO2: 24 mEq/L (ref 19–32)
Calcium: 9 mg/dL (ref 8.4–10.5)
Creatinine, Ser: 1.25 mg/dL — ABNORMAL HIGH (ref 0.50–1.10)
GFR calc Af Amer: 58 mL/min — ABNORMAL LOW (ref >90)
GFR calc non Af Amer: 50 mL/min — ABNORMAL LOW (ref >90)
Glucose, Bld: 93 mg/dL (ref 70–99)
Total Bilirubin: 0.5 mg/dL (ref 0.3–1.2)

## 2012-06-26 LAB — CBC WITH DIFFERENTIAL/PLATELET
Basophils Absolute: 0 10*3/uL (ref 0.0–0.1)
Eosinophils Relative: 1 % (ref 0–5)
HCT: 39.2 % (ref 36.0–46.0)
Hemoglobin: 13.1 g/dL (ref 12.0–15.0)
Lymphocytes Relative: 10 % — ABNORMAL LOW (ref 12–46)
MCV: 89.3 fL (ref 78.0–100.0)
Monocytes Absolute: 0.8 10*3/uL (ref 0.1–1.0)
Monocytes Relative: 8 % (ref 3–12)
RDW: 13.4 % (ref 11.5–15.5)
WBC: 10.7 10*3/uL — ABNORMAL HIGH (ref 4.0–10.5)

## 2012-06-26 LAB — URINE MICROSCOPIC-ADD ON

## 2012-06-26 LAB — URINALYSIS, ROUTINE W REFLEX MICROSCOPIC
Glucose, UA: NEGATIVE mg/dL
Ketones, ur: NEGATIVE mg/dL
Protein, ur: >300 mg/dL — AB

## 2012-06-26 MED ORDER — SODIUM CHLORIDE 0.9 % IV BOLUS (SEPSIS)
1000.0000 mL | Freq: Once | INTRAVENOUS | Status: AC
  Administered 2012-06-26: 1000 mL via INTRAVENOUS

## 2012-06-26 MED ORDER — ONDANSETRON HCL 4 MG/2ML IJ SOLN
4.0000 mg | Freq: Once | INTRAMUSCULAR | Status: AC
  Administered 2012-06-26: 4 mg via INTRAVENOUS
  Filled 2012-06-26: qty 2

## 2012-06-26 MED ORDER — MORPHINE SULFATE 4 MG/ML IJ SOLN
4.0000 mg | Freq: Once | INTRAMUSCULAR | Status: AC
  Administered 2012-06-26: 4 mg via INTRAVENOUS
  Filled 2012-06-26: qty 1

## 2012-06-26 NOTE — ED Notes (Signed)
Pt reports abdominal pain starting Thursday. Pt was concerned she was constipated so she took mag citrate on Thursday without relief. Pt tried taking ex lax 7PM last night without relief.  Pt reports last normal BM was on Tuesday of last week.  Pt reports abdominal cramping.  Pt reports passing small amounts of loose stool and having gas.  Pt reports nausea without vomiting.  Pt reports history of colitis 2 years ago and symptoms are similar to that episode.

## 2012-06-26 NOTE — ED Provider Notes (Signed)
History     CSN: [id number]  Arrival date & time 06/26/12  [place]   First MD Initiated Contact with Patient 06/26/12 2241      Chief Complaint  Patient presents with  . Abdominal Pain  . Abdominal Cramping  . Nausea  . Constipation   HPI  History provided by the patient. Patient is a 51 year old female with history of obesity, cholecystectomy, hypothyroidism and anxiety disorder who presents with complaints of low abdominal discomfort and cramping for the past week. She states symptoms have been mild to moderate with very occasional sharp severe pains. Patient states symptoms feel like intestinal cramping and constipation pains. Patient states she has not had a good normal bowel movement for the past week. Last normal BM was Tuesday. Patient did drink a bottle of mag citrate reports having a small loose watery stool and increased gas. Symptoms slightly improved but have been recurrent with lower abdominal cramping pains. Today patient took 2 ex-lax pills with no changes. Patient has developed some nausea today but denies any episodes of vomiting. She denies any fever, chills or sweats. She denies any dysuria, hematuria, urinary frequency or flank pain. Denies any vaginal bleeding or vaginal discharge.    Past Medical History  Diagnosis Date  . Hypertriglyceridemia     164 - 2/11  . Panic disorder 11/05  . Obesity     250 lbs 8/11  . Atypical chest pain     Negative cardiolite at [facility] cards 11/07  . Anemia, iron deficiency     Requried 2u prbc 6/09, menometrorrhagia, uterine fibroids.  . Hypothyroidism   . Insomnia   . Allergic rhinitis   . Elevated BP   . Anginal pain   . Shortness of breath 05/18/2012    "at rest; lying down; w/exertion"  . Perirectal abscess 11/14/2011  . CHOLECYSTECTOMY, HX OF 08/31/2006    Annotation: 2000 Qualifier: Diagnosis of  By: Renae Fickle MD, Bhakti      Past Surgical History  Procedure Date  . Incision and drainage perirectal abscess 11/14/2011   Procedure: IRRIGATION AND DEBRIDEMENT PERIRECTAL ABSCESS;  Surgeon: Liz Malady, MD;  Location: [facility] OR;  Service: General;  Laterality: N/A;  . Cholecystectomy 07/30/1999  . Abdominal hysterectomy 04/2007  . Dilation and curettage of uterus 1987    Family History  Problem Relation Age of Onset  . Heart attack Mother   . Lung cancer Mother     metestatic, + smoker  . Aneurysm Mother   . Hyperlipidemia Mother   . Hypertension Mother   . Hypertension Father     History  Substance Use Topics  . Smoking status: Never Smoker   . Smokeless tobacco: Never Used  . Alcohol Use: Yes     05/18/12 "have a taste of a drink maybe once/yr"    OB History    Grav Para Term Preterm Abortions TAB SAB Ect Mult Living                  Review of Systems  Constitutional: Negative for fever, chills and appetite change.  Gastrointestinal: Positive for nausea, abdominal pain and constipation. Negative for vomiting, diarrhea and blood in stool.  Genitourinary: Negative for dysuria, frequency, hematuria, flank pain, vaginal bleeding and vaginal discharge.  Musculoskeletal: Negative for back pain.    Allergies  Codeine; Doxycycline; Hydrocodone; Oxycodone; and Sulfamethoxazole w-trimethoprim  Home Medications   Current Outpatient Rx  Name Route Sig Dispense Refill  . LEVOTHYROXINE SODIUM 75 MCG PO TABS Oral Take  1 tablet (75 mcg total) by mouth daily before breakfast. 30 tablet 1  . PAROXETINE HCL 20 MG PO TABS Oral Take 20 mg by mouth every morning.    . SENNOSIDES 15 MG PO TABS Oral Take 1 tablet by mouth as needed.      BP 129/64  Pulse 95  Temp 98.5 F (36.9 C) (Oral)  Resp 20  SpO2 100%  LMP 06/17/2007  Physical Exam  Nursing note and vitals reviewed. Constitutional: She is oriented to person, place, and time. She appears well-developed and well-nourished. No distress.       Morbidly obese.  HENT:  Head: Normocephalic.  Cardiovascular: Normal rate and regular rhythm.   No  murmur heard. Pulmonary/Chest: Effort normal and breath sounds normal. No respiratory distress. She has no wheezes.  Abdominal: Soft. She exhibits no distension. Bowel sounds are decreased. There is no hepatosplenomegaly. There is tenderness. There is no rebound, no guarding, no CVA tenderness, no tenderness at McBurney's point and negative Murphy's sign.       Mild diffuse tenderness  Neurological: She is alert and oriented to person, place, and time.  Skin: Skin is warm and dry. No rash noted.  Psychiatric: She has a normal mood and affect. Her behavior is normal.    ED Course  Procedures   Results for orders placed during the hospital encounter of 06/26/12  URINALYSIS, ROUTINE W REFLEX MICROSCOPIC      Component Value Range   Color, Urine YELLOW  YELLOW   APPearance CLOUDY (*) CLEAR   Specific Gravity, Urine 1.021  1.005 - 1.030   pH 6.0  5.0 - 8.0   Glucose, UA NEGATIVE  NEGATIVE mg/dL   Hgb urine dipstick TRACE (*) NEGATIVE   Bilirubin Urine NEGATIVE  NEGATIVE   Ketones, ur NEGATIVE  NEGATIVE mg/dL   Protein, ur >409 (*) NEGATIVE mg/dL   Urobilinogen, UA 1.0  0.0 - 1.0 mg/dL   Nitrite NEGATIVE  NEGATIVE   Leukocytes, UA NEGATIVE  NEGATIVE  CBC WITH DIFFERENTIAL      Component Value Range   WBC 10.7 (*) 4.0 - 10.5 K/uL   RBC 4.39  3.87 - 5.11 MIL/uL   Hemoglobin 13.1  12.0 - 15.0 g/dL   HCT 81.1  91.4 - 78.2 %   MCV 89.3  78.0 - 100.0 fL   MCH 29.8  26.0 - 34.0 pg   MCHC 33.4  30.0 - 36.0 g/dL   RDW 95.6  21.3 - 08.6 %   Platelets 265  150 - 400 K/uL   Neutrophils Relative 81 (*) 43 - 77 %   Neutro Abs 8.7 (*) 1.7 - 7.7 K/uL   Lymphocytes Relative 10 (*) 12 - 46 %   Lymphs Abs 1.1  0.7 - 4.0 K/uL   Monocytes Relative 8  3 - 12 %   Monocytes Absolute 0.8  0.1 - 1.0 K/uL   Eosinophils Relative 1  0 - 5 %   Eosinophils Absolute 0.1  0.0 - 0.7 K/uL   Basophils Relative 0  0 - 1 %   Basophils Absolute 0.0  0.0 - 0.1 K/uL  COMPREHENSIVE METABOLIC PANEL      Component  Value Range   Sodium 141  135 - 145 mEq/L   Potassium 3.6  3.5 - 5.1 mEq/L   Chloride 103  96 - 112 mEq/L   CO2 24  19 - 32 mEq/L   Glucose, Bld 93  70 - 99 mg/dL   BUN 10  6 - 23 mg/dL   Creatinine, Ser 1.61 (*) 0.50 - 1.10 mg/dL   Calcium 9.0  8.4 - 09.6 mg/dL   Total Protein 7.4  6.0 - 8.3 g/dL   Albumin 3.5  3.5 - 5.2 g/dL   AST 14  0 - 37 U/L   ALT 18  0 - 35 U/L   Alkaline Phosphatase 122 (*) 39 - 117 U/L   Total Bilirubin 0.5  0.3 - 1.2 mg/dL   GFR calc non Af Amer 50 (*) >90 mL/min   GFR calc Af Amer 58 (*) >90 mL/min  URINE MICROSCOPIC-ADD ON      Component Value Range   Squamous Epithelial / LPF MANY (*) RARE   WBC, UA 3-6  <3 WBC/hpf   RBC / HPF 0-2  <3 RBC/hpf       Dg Abd Acute W/chest  06/26/2012  *RADIOLOGY REPORT*  Clinical Data: Abdominal pain.  Constipation.  ACUTE ABDOMEN SERIES (ABDOMEN 2 VIEW & CHEST 1 VIEW)  Comparison: Chest and two views abdomen 09/17/2011.  CT abdomen and pelvis 08/23/2011.  Findings: Single view of the chest demonstrates clear lungs normal heart size.  No pneumothorax or pleural fluid.  Two views of the abdomen show no free intraperitoneal air.  The bowel gas pattern is normal.  Cholecystectomy clips are noted.  IMPRESSION: Negative exam.   Original Report Authenticated By: Bernadene Bell. D'ALESSIO, M.D.      1. Abdominal cramping   2. Constipation   3. Proteinuria       MDM  10:45PM Pt seen and evaluated.  Pt is in no acute distress.  Pt appears comfortable    Patient feeling much better after medications. Patient is have soft abdomen without peritoneal signs. Her labs are essentially unremarkable. X-rays also did not show any concerning cause of symptoms. No SBO. I did discuss with patient elevated protein in urine and slight elevated creatinine. Patient has had increased protein in urine on multiple prior visits. Patient is given IV fluids for elevated creatinine. She has no flank pain or signs of pyelonephritis. At this time could  have patient followup with PCP for recheck and further evaluation of urine.     Angus Seller, [place] 06/27/12 [phone]

## 2012-06-26 NOTE — ED Notes (Signed)
Pt back from X-Ray.  

## 2012-06-26 NOTE — ED Notes (Signed)
PA at bedside.

## 2012-06-26 NOTE — ED Notes (Signed)
Pt transported to XRay 

## 2012-06-29 ENCOUNTER — Ambulatory Visit: Payer: Self-pay | Admitting: Internal Medicine

## 2012-06-30 NOTE — ED Provider Notes (Signed)
Medical screening examination/treatment/procedure(s) were performed by non-physician practitioner and as supervising physician I was immediately available for consultation/collaboration.  Marium Ragan, MD 06/30/12 1637 

## 2012-07-14 ENCOUNTER — Ambulatory Visit (INDEPENDENT_AMBULATORY_CARE_PROVIDER_SITE_OTHER): Payer: Self-pay | Admitting: Internal Medicine

## 2012-07-14 ENCOUNTER — Encounter: Payer: Self-pay | Admitting: Internal Medicine

## 2012-07-14 ENCOUNTER — Encounter (INDEPENDENT_AMBULATORY_CARE_PROVIDER_SITE_OTHER): Payer: Self-pay | Admitting: Ophthalmology

## 2012-07-14 VITALS — BP 120/80 | HR 60 | Temp 98.0°F | Ht 61.0 in | Wt 265.8 lb

## 2012-07-14 DIAGNOSIS — W2209XA Striking against other stationary object, initial encounter: Secondary | ICD-10-CM

## 2012-07-14 DIAGNOSIS — E781 Pure hyperglyceridemia: Secondary | ICD-10-CM

## 2012-07-14 DIAGNOSIS — S63612A Unspecified sprain of right middle finger, initial encounter: Secondary | ICD-10-CM

## 2012-07-14 DIAGNOSIS — E669 Obesity, unspecified: Secondary | ICD-10-CM

## 2012-07-14 DIAGNOSIS — R809 Proteinuria, unspecified: Secondary | ICD-10-CM

## 2012-07-14 DIAGNOSIS — R0789 Other chest pain: Secondary | ICD-10-CM

## 2012-07-14 DIAGNOSIS — IMO0002 Reserved for concepts with insufficient information to code with codable children: Secondary | ICD-10-CM

## 2012-07-14 DIAGNOSIS — Z23 Encounter for immunization: Secondary | ICD-10-CM

## 2012-07-14 DIAGNOSIS — E039 Hypothyroidism, unspecified: Secondary | ICD-10-CM

## 2012-07-14 NOTE — Progress Notes (Addendum)
Subjective:   Patient ID: [name] female   DOB: Aug 20, 1961 51 y.o.   MRN: [id number]  HPI: [name] is a 51 y.o. women with a past medical history for atypical chest pain with Negative Cardiolite at [place] cards 11/07, Obesity, HTN, HLD, hypothyroidism, panic disorder, presents to the outpatient clinic for follow up visit.  1. HFU for chest pain     Patient was admitted for chest pain evaluation on 05/18/12. She had Negative cardiac workup, and Cardiology evaluated her while in the hospital and recommended no further work up. Her chest resolved since discharge. She has no c/o chest pain or pressure.   2.  Right middle finger pain Patient states that she pinched her right 3rd finger when she closed the trunk door of her car a few days ago. She noticed some degree of swelling, bruising and pain, which are much better today after she used ice packs and placed her finger on a finger brace purchased from local drug store.  She is able to move her finger.  3. Hypothyroidism Her synthroid dose was increased from 50 to 75 mcg daily since her last admission on 05/18/12. She is asymptomatic and tolerates well.  4.  Proteinuria Asymptomatic, Intermittent proteinuria noted since 2011.   She states that both her uncle and his son was diagnosed with Leukemia. She would like to know more information about the leukemia.    Past Medical History  Diagnosis Date  . Hypertriglyceridemia     164 - 2/11  . Panic disorder 11/05  . Obesity     250 lbs 8/11  . Atypical chest pain     Negative cardiolite at [facility] cards 11/07  . Anemia, iron deficiency     Requried 2u prbc 6/09, menometrorrhagia, uterine fibroids.  . Hypothyroidism   . Insomnia   . Allergic rhinitis   . Elevated BP   . Anginal pain   . Shortness of breath 05/18/2012    "at rest; lying down; w/exertion"  . Perirectal abscess 11/14/2011  . CHOLECYSTECTOMY, HX OF 08/31/2006    Annotation: 2000 Qualifier: Diagnosis of  By:  Renae Fickle MD, Bhakti     Current Outpatient Prescriptions  Medication Sig Dispense Refill  . levothyroxine (SYNTHROID, LEVOTHROID) 75 MCG tablet Take 1 tablet (75 mcg total) by mouth daily before breakfast.  30 tablet  1  . PARoxetine (PAXIL) 20 MG tablet Take 20 mg by mouth every morning.      . Sennosides (EX-LAX) 15 MG TABS Take 1 tablet by mouth as needed.       Family History  Problem Relation Age of Onset  . Heart attack Mother   . Lung cancer Mother     metestatic, + smoker  . Aneurysm Mother   . Hyperlipidemia Mother   . Hypertension Mother   . Hypertension Father    History   Social History  . Marital Status: Married    Spouse Name: N/A    Number of Children: N/A  . Years of Education: N/A   Social History Main Topics  . Smoking status: Never Smoker   . Smokeless tobacco: Never Used  . Alcohol Use: Yes     05/18/12 "have a taste of a drink maybe once/yr"  . Drug Use: No  . Sexually Active: Not Currently   Other Topics Concern  . None   Social History Narrative   Currently unemployed. Starting college in Aug for accounting.  Married, has 2 children lives with her youngest  son.     Review of Systems: Review of Systems:  Constitutional:  Denies fever, chills, diaphoresis, appetite change and fatigue.   HEENT:  Denies congestion, sore throat, rhinorrhea, sneezing, mouth sores, trouble swallowing, neck pain   Respiratory:  Denies SOB, DOE, cough, and wheezing.   Cardiovascular:  Denies palpitations and leg swelling.   Gastrointestinal:  Denies nausea, vomiting, abdominal pain, diarrhea, constipation, blood in stool and abdominal distention.   Genitourinary:  Denies dysuria, urgency, frequency, hematuria, flank pain and difficulty urinating.   Musculoskeletal:  Denies myalgias, back pain. Right middle finger slightly swelling and tender to touch  Skin:  Denies pallor, rash and wound.   Neurological:  Denies dizziness, seizures, syncope, weakness, light-headedness,  numbness and headaches.    .   Objective:  Physical Exam: Filed Vitals:   07/14/12 1542  BP: 120/80  Pulse: 60  Temp: 98 F (36.7 C)  TempSrc: Oral  Height: 5\' 1"  (1.549 m)  Weight: 265 lb 12.8 oz (120.566 kg)  SpO2: 99%   General: alert, well-developed, and cooperative to examination.  Head: normocephalic and atraumatic.  Eyes: vision grossly intact, pupils equal, pupils round, pupils reactive to light, no injection and anicteric.  Mouth: pharynx pink and moist, no erythema, and no exudates.  Neck: supple, full ROM, no thyromegaly, no JVD, and no carotid bruits.  Lungs: normal respiratory effort, no accessory muscle use, normal breath sounds, no crackles, and no wheezes. Heart: normal rate, regular rhythm, no murmur, no gallop, and no rub.  Abdomen: soft, non-tender, normal bowel sounds, no distention, no guarding, no rebound tenderness, no hepatomegaly, and no splenomegaly.  Msk: Right middle finger slightly swelling and tenderness noted on middle Phalanx area. No erythema, warmth to touch or bony deformity. No limited ROM.  Pulses: 2+ DP/PT pulses bilaterally Extremities: No cyanosis, clubbing, edema Neurologic: alert & oriented X3, cranial nerves II-XII intact, strength normal in all extremities, sensation intact to light touch, and gait normal.  Skin: turgor normal and no rashes.  Psych: Oriented X3, memory intact for recent and remote, normally interactive, good eye contact, not anxious appearing, and not depressed appearing.   Assessment & Plan:    Addendum  With regards to her hypothyroidism, She is free of symptoms and her free T4 is normalized at 1.08 (0.8-1.80). will keep her on same dose

## 2012-07-14 NOTE — Patient Instructions (Addendum)
1. Follow up in one month 2. Will discuss your other health maintenance and chronic proteinuria 3. Will discuss with you about your concerns over your family history of cancers.

## 2012-07-15 LAB — BASIC METABOLIC PANEL WITH GFR
BUN: 14 mg/dL (ref 6–23)
CO2: 27 mEq/L (ref 19–32)
Calcium: 9.4 mg/dL (ref 8.4–10.5)
Chloride: 103 mEq/L (ref 96–112)
Creat: 1.03 mg/dL (ref 0.50–1.10)
GFR, Est African American: 74 mL/min
GFR, Est Non African American: 64 mL/min
Glucose, Bld: 88 mg/dL (ref 70–99)
Potassium: 4.4 mEq/L (ref 3.5–5.3)
Sodium: 141 mEq/L (ref 135–145)

## 2012-07-15 LAB — LIPID PANEL
Cholesterol: 200 mg/dL (ref 0–200)
HDL: 39 mg/dL — ABNORMAL LOW (ref >39)
Triglycerides: 255 mg/dL — ABNORMAL HIGH (ref <150)

## 2012-07-15 LAB — T4, FREE: Free T4: 1.08 ng/dL (ref 0.80–1.80)

## 2012-07-17 DIAGNOSIS — R809 Proteinuria, unspecified: Secondary | ICD-10-CM | POA: Insufficient documentation

## 2012-07-17 DIAGNOSIS — S63612A Unspecified sprain of right middle finger, initial encounter: Secondary | ICD-10-CM | POA: Insufficient documentation

## 2012-07-17 HISTORY — DX: Proteinuria, unspecified: R80.9

## 2012-07-17 NOTE — Assessment & Plan Note (Signed)
Right finger pain from mild trauma. No signes of fracture. Her localized swelling, pain and bruising is already better.   - continue finger brace - may continue to use ice packs as needed.  - call if swelling or pain is worse or bony deformity is noted.

## 2012-07-17 NOTE — Assessment & Plan Note (Addendum)
Recently increased Synthroid dosage on 05/18/12.   - will follow up on free T4

## 2012-07-17 NOTE — Assessment & Plan Note (Signed)
Intermittent proteinuria noted since 2011  - will order tests and follow up at next OV - Hb A1C, BMP, Lipid panel - will also need to repeat UA and urine Microalbumin/Cr during next OV.

## 2012-07-17 NOTE — Assessment & Plan Note (Signed)
asymptomatic today. Recently admitted on 05/18/12 for chest pain. Negative cardiac workup and cardiology indicated no further workup needed

## 2012-08-03 ENCOUNTER — Encounter: Payer: Self-pay | Admitting: *Deleted

## 2012-08-11 ENCOUNTER — Encounter (INDEPENDENT_AMBULATORY_CARE_PROVIDER_SITE_OTHER): Payer: Self-pay | Admitting: Ophthalmology

## 2012-09-05 ENCOUNTER — Other Ambulatory Visit: Payer: Self-pay | Admitting: Internal Medicine

## 2012-09-06 ENCOUNTER — Encounter (INDEPENDENT_AMBULATORY_CARE_PROVIDER_SITE_OTHER): Payer: Self-pay | Admitting: Ophthalmology

## 2012-09-22 ENCOUNTER — Encounter: Payer: Self-pay | Admitting: Internal Medicine

## 2012-09-22 ENCOUNTER — Ambulatory Visit (INDEPENDENT_AMBULATORY_CARE_PROVIDER_SITE_OTHER): Payer: No Typology Code available for payment source | Admitting: Internal Medicine

## 2012-09-22 VITALS — BP 130/80 | HR 60 | Temp 97.3°F | Ht 61.0 in | Wt 270.7 lb

## 2012-09-22 DIAGNOSIS — Z806 Family history of leukemia: Secondary | ICD-10-CM

## 2012-09-22 DIAGNOSIS — K760 Fatty (change of) liver, not elsewhere classified: Secondary | ICD-10-CM

## 2012-09-22 DIAGNOSIS — E8881 Metabolic syndrome: Secondary | ICD-10-CM

## 2012-09-22 DIAGNOSIS — K5730 Diverticulosis of large intestine without perforation or abscess without bleeding: Secondary | ICD-10-CM | POA: Insufficient documentation

## 2012-09-22 DIAGNOSIS — E785 Hyperlipidemia, unspecified: Secondary | ICD-10-CM

## 2012-09-22 DIAGNOSIS — R7303 Prediabetes: Secondary | ICD-10-CM

## 2012-09-22 DIAGNOSIS — R7309 Other abnormal glucose: Secondary | ICD-10-CM

## 2012-09-22 DIAGNOSIS — J320 Chronic maxillary sinusitis: Secondary | ICD-10-CM | POA: Insufficient documentation

## 2012-09-22 DIAGNOSIS — Q676 Pectus excavatum: Secondary | ICD-10-CM

## 2012-09-22 DIAGNOSIS — Z90710 Acquired absence of both cervix and uterus: Secondary | ICD-10-CM

## 2012-09-22 DIAGNOSIS — Z79899 Other long term (current) drug therapy: Secondary | ICD-10-CM

## 2012-09-22 DIAGNOSIS — F410 Panic disorder [episodic paroxysmal anxiety] without agoraphobia: Secondary | ICD-10-CM

## 2012-09-22 DIAGNOSIS — E039 Hypothyroidism, unspecified: Secondary | ICD-10-CM

## 2012-09-22 DIAGNOSIS — R809 Proteinuria, unspecified: Secondary | ICD-10-CM

## 2012-09-22 HISTORY — DX: Metabolic syndrome: E88.81

## 2012-09-22 HISTORY — DX: Family history of leukemia: Z80.6

## 2012-09-22 HISTORY — DX: Fatty (change of) liver, not elsewhere classified: K76.0

## 2012-09-22 HISTORY — DX: Prediabetes: R73.03

## 2012-09-22 HISTORY — DX: Pectus excavatum: Q67.6

## 2012-09-22 HISTORY — DX: Chronic maxillary sinusitis: J32.0

## 2012-09-22 HISTORY — DX: Metabolic syndrome: E88.810

## 2012-09-22 LAB — POCT GLYCOSYLATED HEMOGLOBIN (HGB A1C): Hemoglobin A1C: 5.4

## 2012-09-22 LAB — GLUCOSE, CAPILLARY: Glucose-Capillary: 81 mg/dL (ref 70–99)

## 2012-09-22 MED ORDER — PAROXETINE HCL 20 MG PO TABS: 20.0000 mg | ORAL_TABLET | Freq: Every day | ORAL | Status: DC

## 2012-09-22 MED ORDER — FENOFIBRATE 145 MG PO TABS: 145.0000 mg | ORAL_TABLET | Freq: Every day | ORAL | Status: DC

## 2012-09-22 NOTE — Patient Instructions (Addendum)
1. Will postpone your pap smear to Feb 2. Follow up with me in Feb, 2014. 3. Will check your blood and urine today. 4. Will start Tricor 145 mg po daily. Will recheck your lipid panel and LFT in 2 months.   Fenofibrate (micronized) capsule What is this medicine? FENOFIBRATE (fen oh FYE brate) can help lower blood fats and cholesterol for people who are at risk of getting inflammation of the pancreas (pancreatitis) from having very high amounts of fats in their blood. This medicine is only for patients whose blood fats are not controlled by diet. This medicine may be used for other purposes; ask your health care provider or pharmacist if you have questions. What should I tell my health care provider before I take this medicine? They need to know if you have any of these conditions: -gallbladder disease -heart disease -kidney disease -liver disease -an unusual or allergic reaction to fenofibrate, gemfibrozil, other medicines, foods, dyes, or preservatives -pregnant or trying to get pregnant -breast-feeding How should I use this medicine? Take this medicine by mouth with a glass of water. Follow the directions on the prescription label. This medicine may be taken with or without food. Take your medicine at regular intervals. Do not take it more often than directed. Do not stop taking except on your doctor's advice. Talk to your pediatrician regarding the use of this medicine in children. Special care may be needed. Overdosage: If you think you have taken too much of this medicine contact a poison control center or emergency room at once. NOTE: This medicine is only for you. Do not share this medicine with others. What if I miss a dose? If you miss a dose, take it as soon as you can. If it is almost time for your next dose, take only that dose. Do not take double or extra doses. What may interact with this medicine? Do not take this medicine with any of the following  medications: -ezetimibe -statin-type cholesterol lowering drugs like atorvastatin, cerivastatin, fluvastatin, lovastatin, pravastatin, or simvastatin -red yeast rice This medicine may also interact with the following medications: -cholestyramine or colestipol -cyclosporine -warfarin This list may not describe all possible interactions. Give your health care provider a list of all the medicines, herbs, non-prescription drugs, or dietary supplements you use. Also tell them if you smoke, drink alcohol, or use illegal drugs. Some items may interact with your medicine. What should I watch for while using this medicine? Visit your doctor or health care professional for regular checks on your progress. Your blood fat levels and other tests will be measured from time to time. Do not stop taking this medicine except on the advice of your doctor or health care professional. This medicine is only part of a total cholesterol-lowering program. Your health care professional or dietician can suggest a low-cholesterol and low-fat diet that will reduce your risk of getting heart and blood vessel disease. Avoid alcohol and smoking, and keep a proper exercise schedule. If you are diabetic, close regulation and monitoring of your blood sugars can help your blood fat levels. This medicine may change the way your diabetic medicine works, and sometimes will require that your dosages be adjusted. Check with your doctor or health care professional. This medicine can make you more sensitive to the sun. Keep out of the sun. If you cannot avoid being in the sun, wear protective clothing and use sunscreen. Do not use sun lamps or tanning beds/booths. What side effects may I notice from receiving  this medicine? Side effects that you should report to your doctor or health care professional as soon as possible: -allergic reactions like skin rash, itching or hives, swelling of the face, lips, or tongue -dark urine -lower back or  side pain -muscle pain, tenderness, or weakness -stomach pain -trouble passing urine or change in the amount of urine -unusually weak or tired -yellowing of the eyes or skin Side effects that usually do not require medical attention (report to your doctor or health care professional if they continue or are bothersome): -constipation -headache -nausea This list may not describe all possible side effects. Call your doctor for medical advice about side effects. You may report side effects to FDA at 1-800-FDA-1088. Where should I keep my medicine? Keep out of the reach of children. Store at room temperature between 15 and 30 degrees C (59 and 86 degrees F). Keep container tightly closed. Throw away any unused medicine after the expiration date. NOTE: This sheet is a summary. It may not cover all possible information. If you have questions about this medicine, talk to your doctor, pharmacist, or health care provider.  2013, [employer]. (07/23/2010 1:55:41 PM)

## 2012-09-22 NOTE — Assessment & Plan Note (Signed)
Hemoglobin A1c was 6.0 in August 2012.  Patient is asymptomatic -Will recheck her hemoglobin A1c today -Referred to diabetic educator Lupita Leash.  Patient is instructed to make appointment to see Lupita Leash next week

## 2012-09-22 NOTE — Assessment & Plan Note (Signed)
Patient is asymptomatic except for occasional tiredness.  Reports medical compliance with her centrally. Last free T4 1.08 - Will recheck her TSH and free T4 today -If her TSH is still high,I would consider increase her Synthroid dosage to 100 mcg a day even with normal free T4. -Questionable thyroid nodules appreciated during the examination. however it is unclear due to her body habitus.  I would order a thyroid ultrasound.

## 2012-09-22 NOTE — Assessment & Plan Note (Addendum)
-   Intensify weight management - Increase physical activity - Fenofibrate started today - LFT and Lipid panel in 2 months

## 2012-09-22 NOTE — Assessment & Plan Note (Signed)
Will send referral to genetic counseling at cancer [facility]

## 2012-09-22 NOTE — Progress Notes (Signed)
Subjective:   Patient ID: [name] female   DOB: [birth year] 51 y.o.   MRN: [id number]  HPI: [name] is a 51 y.o. women with a past medical history of Obesity, HTN, HLD, hypothyroidism, panic disorder, presents to the outpatient clinic for follow up visit.  1. Hypothyroidism Her synthroid dose is 75 mcg daily since her last admission on 05/18/12. Her Free T4 was normal in Oct 2013. She states that she feel well except for occasional tiredness.   2.  Proteinuria Asymptomatic, Intermittent proteinuria noted since 2011. Good kidney function. Will check Hb A1C and UA and Urine Microalbumin.  Health maintenance -Patient had TAH and BSO in 2009 and last Pap smear  was normal in 2009. - She would like to have Pap smear done in Feb 2014.    Past Medical History  Diagnosis Date  . Hypertriglyceridemia     89 - 2/11  . Panic disorder 11/05  . Obesity     250 lbs 8/11  . Atypical chest pain     Negative cardiolite at [facility] cards 11/07  . Anemia, iron deficiency     Requried 2u prbc 6/09, menometrorrhagia, uterine fibroids.  . Hypothyroidism   . Insomnia   . Allergic rhinitis   . Elevated BP   . Anginal pain   . Shortness of breath 05/18/2012    "at rest; lying down; w/exertion"  . Perirectal abscess 11/14/2011  . CHOLECYSTECTOMY, HX OF 08/31/2006    Annotation: 2000 Qualifier: Diagnosis of  By: Renae Fickle MD, Bhakti     Current Outpatient Prescriptions  Medication Sig Dispense Refill  . fenofibrate (TRICOR) 145 MG tablet Take 1 tablet (145 mg total) by mouth daily.  30 tablet  11  . levothyroxine (SYNTHROID, LEVOTHROID) 75 MCG tablet Take 1 tablet (75 mcg total) by mouth daily before breakfast.  30 tablet  1  . PARoxetine (PAXIL) 20 MG tablet Take 20 mg by mouth every morning.      Marland Kitchen PARoxetine (PAXIL) 20 MG tablet TAKE ONE TABLET BY MOUTH EVERY DAY  30 tablet  11  . Sennosides (EX-LAX) 15 MG TABS Take 1 tablet by mouth as needed.       Family History  Problem Relation  Age of Onset  . Heart attack Mother   . Lung cancer Mother     metestatic, + smoker  . Aneurysm Mother   . Hyperlipidemia Mother   . Hypertension Mother   . Hypertension Father    History   Social History  . Marital Status: Married    Spouse Name: N/A    Number of Children: N/A  . Years of Education: N/A   Social History Main Topics  . Smoking status: Never Smoker   . Smokeless tobacco: Never Used  . Alcohol Use: Yes     Comment: 05/18/12 "have a taste of a drink maybe once/yr"  . Drug Use: No  . Sexually Active: Not Currently   Other Topics Concern  . Not on file   Social History Narrative   Currently unemployed. Starting college in Aug for accounting.  Married, has 2 children lives with her youngest son.     Review of Systems: Review of Systems:  Constitutional:  Denies fever, chills, diaphoresis, appetite change and fatigue.   HEENT:  Denies congestion, sore throat, rhinorrhea, sneezing, mouth sores, trouble swallowing, neck pain   Respiratory:  Denies SOB, DOE, cough, and wheezing.   Cardiovascular:  Denies palpitations and leg swelling.   Gastrointestinal:  Denies nausea, vomiting, abdominal pain, diarrhea, constipation, blood in stool and abdominal distention.   Genitourinary:  Denies dysuria, urgency, frequency, hematuria, flank pain and difficulty urinating.   Musculoskeletal:  Denies myalgias, back pain.   Skin:  Denies pallor, rash and wound.   Neurological:  Denies dizziness, seizures, syncope, weakness, light-headedness, numbness and headaches.    .   Objective:  Physical Exam: There were no vitals filed for this visit. General: alert, well-developed, and cooperative to examination.  Head: normocephalic and atraumatic.  Eyes: vision grossly intact, pupils equal, pupils round, pupils reactive to light, no injection and anicteric.  Mouth: pharynx pink and moist, no erythema, and no exudates.  Neck: supple, full ROM, no thyromegaly, no JVD, and no carotid  bruits.  Lungs: normal respiratory effort, no accessory muscle use, normal breath sounds, no crackles, and no wheezes. Heart: normal rate, regular rhythm, no murmur, no gallop, and no rub.  Abdomen: soft, non-tender, normal bowel sounds, no distention, no guarding, no rebound tenderness, no hepatomegaly, and no splenomegaly.  Msk No pain, erythema, warmth to touch or bony deformity. No limited ROM. Pulses: 2+ DP/PT pulses bilaterally Extremities: No cyanosis, clubbing, edema Neurologic: alert & oriented X3, cranial nerves II-XII intact, strength normal in all extremities, sensation intact to light touch, and gait normal.  Skin: turgor normal and no rashes.  Psych: Oriented X3, memory intact for recent and remote, normally interactive, good eye contact, not anxious appearing, and not depressed appearing.   Assessment & Plan:

## 2012-09-22 NOTE — Assessment & Plan Note (Signed)
Asymptomatic, Intermittent proteinuria noted since 2011. Good kidney function. Will check Hb A1C and UA and Urine Microalbumin

## 2012-09-23 ENCOUNTER — Telehealth: Payer: Self-pay | Admitting: Genetic Counselor

## 2012-09-23 LAB — URINALYSIS, MICROSCOPIC ONLY

## 2012-09-23 LAB — URINALYSIS, ROUTINE W REFLEX MICROSCOPIC
Glucose, UA: NEGATIVE mg/dL
Leukocytes, UA: NEGATIVE
Protein, ur: 100 mg/dL — AB
Specific Gravity, Urine: 1.02 (ref 1.005–1.030)

## 2012-09-23 LAB — MICROALBUMIN / CREATININE URINE RATIO: Microalb, Ur: 68.73 mg/dL — ABNORMAL HIGH (ref 0.00–1.89)

## 2012-09-23 NOTE — Telephone Encounter (Signed)
S/W pt in re Genetic appt 02/17 @ 11 w/Karen Lowell Guitar.  Welcome packet mailed.

## 2012-09-28 ENCOUNTER — Ambulatory Visit (HOSPITAL_COMMUNITY)
Admission: RE | Admit: 2012-09-28 | Discharge: 2012-09-28 | Disposition: A | Discharge status: Home / Self Care | Payer: No Typology Code available for payment source | Source: Ambulatory Visit | Attending: Internal Medicine | Admitting: Internal Medicine

## 2012-09-28 DIAGNOSIS — E039 Hypothyroidism, unspecified: Secondary | ICD-10-CM | POA: Insufficient documentation

## 2012-09-28 DIAGNOSIS — E042 Nontoxic multinodular goiter: Secondary | ICD-10-CM | POA: Insufficient documentation

## 2012-10-02 ENCOUNTER — Encounter: Payer: Self-pay | Admitting: Internal Medicine

## 2012-10-03 ENCOUNTER — Telehealth: Payer: Self-pay | Admitting: Internal Medicine

## 2012-10-03 NOTE — Telephone Encounter (Signed)
Patient contacted triage nurse through my chart and requested some followup regarding her recent labs and thyroid ultrasound done 09/22/2012.  Dr. Dierdre Searles is currently away on vacation.  I called patient and briefly reviewed the results with her, including the elevated urine microalbumin (etiology not clear), the mildly elevated TSH, and the thyroid ultrasound, which showed a multinodular goiter and a prominent lymph node in the left neck felt by the radiologist to be reactive.  I explained that these issues would need followup by Dr. Dierdre Searles, who will be back next week.  I will ask that Dr. Dierdre Searles call patient when she returns.

## 2012-10-10 ENCOUNTER — Encounter: Payer: Self-pay | Admitting: Internal Medicine

## 2012-10-10 ENCOUNTER — Other Ambulatory Visit: Payer: Self-pay | Admitting: Internal Medicine

## 2012-10-10 DIAGNOSIS — E039 Hypothyroidism, unspecified: Secondary | ICD-10-CM

## 2012-10-10 MED ORDER — LEVOTHYROXINE SODIUM 100 MCG PO TABS: 75.0000 ug | ORAL_TABLET | Freq: Every day | ORAL | Status: DC

## 2012-10-10 MED ORDER — LEVOTHYROXINE SODIUM 88 MCG PO TABS: 75.0000 ug | ORAL_TABLET | Freq: Every day | ORAL | Status: DC

## 2012-10-10 NOTE — Assessment & Plan Note (Addendum)
-  TSH is still elevated at 6.5 with Free T4 1.05. Will increase her dose to 88 mcg daily and repeat TFT in 6 weeks. - will schedule u/s guided FNA

## 2012-10-13 ENCOUNTER — Encounter: Payer: Self-pay | Admitting: *Deleted

## 2012-10-13 ENCOUNTER — Encounter: Payer: No Typology Code available for payment source | Admitting: Dietician

## 2012-10-14 ENCOUNTER — Other Ambulatory Visit: Payer: Self-pay | Admitting: Internal Medicine

## 2012-10-17 ENCOUNTER — Ambulatory Visit: Payer: No Typology Code available for payment source | Admitting: Dietician

## 2012-10-18 NOTE — Progress Notes (Signed)
Opened in error

## 2012-10-20 NOTE — Addendum Note (Signed)
Addended by: Neomia Dear on: 10/20/2012 06:39 PM   Modules accepted: Orders

## 2012-11-01 ENCOUNTER — Emergency Department (HOSPITAL_COMMUNITY)
Admission: EM | Admit: 2012-11-01 | Discharge: 2012-11-02 | Disposition: A | Discharge status: Home / Self Care | Payer: No Typology Code available for payment source | Attending: Emergency Medicine | Admitting: Emergency Medicine

## 2012-11-01 ENCOUNTER — Encounter (HOSPITAL_COMMUNITY): Payer: Self-pay | Admitting: *Deleted

## 2012-11-01 DIAGNOSIS — Z79899 Other long term (current) drug therapy: Secondary | ICD-10-CM | POA: Insufficient documentation

## 2012-11-01 DIAGNOSIS — F329 Major depressive disorder, single episode, unspecified: Secondary | ICD-10-CM | POA: Insufficient documentation

## 2012-11-01 DIAGNOSIS — F411 Generalized anxiety disorder: Secondary | ICD-10-CM | POA: Insufficient documentation

## 2012-11-01 DIAGNOSIS — Z7982 Long term (current) use of aspirin: Secondary | ICD-10-CM | POA: Insufficient documentation

## 2012-11-01 DIAGNOSIS — F3289 Other specified depressive episodes: Secondary | ICD-10-CM | POA: Insufficient documentation

## 2012-11-01 DIAGNOSIS — K612 Anorectal abscess: Secondary | ICD-10-CM | POA: Insufficient documentation

## 2012-11-01 DIAGNOSIS — E039 Hypothyroidism, unspecified: Secondary | ICD-10-CM | POA: Insufficient documentation

## 2012-11-01 DIAGNOSIS — K610 Anal abscess: Secondary | ICD-10-CM

## 2012-11-01 DIAGNOSIS — E781 Pure hyperglyceridemia: Secondary | ICD-10-CM | POA: Insufficient documentation

## 2012-11-01 NOTE — ED Notes (Signed)
Pt states she has a perirectal abscess. Pt states she had one last year and believes that it has come back the patient states that she has tried stiz baths and unable to get pain relief. Pt had surgery last year for the same abscess.

## 2012-11-02 ENCOUNTER — Emergency Department (HOSPITAL_COMMUNITY): Payer: No Typology Code available for payment source

## 2012-11-02 ENCOUNTER — Emergency Department (HOSPITAL_COMMUNITY): Payer: No Typology Code available for payment source | Admitting: Certified Registered"

## 2012-11-02 ENCOUNTER — Encounter (HOSPITAL_COMMUNITY): Payer: Self-pay | Admitting: Certified Registered"

## 2012-11-02 ENCOUNTER — Encounter (HOSPITAL_COMMUNITY): Payer: Self-pay | Admitting: Radiology

## 2012-11-02 ENCOUNTER — Encounter (HOSPITAL_COMMUNITY)
Admission: EM | Disposition: A | Discharge status: Home / Self Care | Payer: Self-pay | Source: Home / Self Care | Attending: Emergency Medicine

## 2012-11-02 DIAGNOSIS — K612 Anorectal abscess: Secondary | ICD-10-CM

## 2012-11-02 HISTORY — PX: INCISION AND DRAINAGE ABSCESS: SHX5864

## 2012-11-02 LAB — POCT I-STAT, CHEM 8
Creatinine, Ser: 1.1 mg/dL (ref 0.50–1.10)
HCT: 41 % (ref 36.0–46.0)
Hemoglobin: 13.9 g/dL (ref 12.0–15.0)
Potassium: 3.9 mEq/L (ref 3.5–5.1)
Sodium: 142 mEq/L (ref 135–145)

## 2012-11-02 SURGERY — INCISION AND DRAINAGE, ABSCESS
Anesthesia: General | Site: Buttocks | Wound class: Dirty or Infected

## 2012-11-02 MED ORDER — LIDOCAINE HCL (PF) 2 % IJ SOLN
5.0000 mL | Freq: Once | INTRAMUSCULAR | Status: AC
  Administered 2012-11-02: 5 mL via INTRADERMAL

## 2012-11-02 MED ORDER — DEXTROSE 5 % IV SOLN
2.0000 g | INTRAVENOUS | Status: AC
  Administered 2012-11-02: 2 g via INTRAVENOUS
  Filled 2012-11-02: qty 2

## 2012-11-02 MED ORDER — LACTATED RINGERS IV SOLN
INTRAVENOUS | Status: DC | PRN
  Administered 2012-11-02: 10:00:00 via INTRAVENOUS

## 2012-11-02 MED ORDER — LIDOCAINE HCL (PF) 1 % IJ SOLN: 5.0000 mL | Freq: Once | INTRAMUSCULAR | Status: DC

## 2012-11-02 MED ORDER — MORPHINE SULFATE 4 MG/ML IJ SOLN
4.0000 mg | Freq: Once | INTRAMUSCULAR | Status: AC
  Administered 2012-11-02: 4 mg via INTRAVENOUS
  Filled 2012-11-02: qty 1

## 2012-11-02 MED ORDER — FENTANYL CITRATE 0.05 MG/ML IJ SOLN
INTRAMUSCULAR | Status: DC | PRN
  Administered 2012-11-02: 50 ug via INTRAVENOUS
  Administered 2012-11-02: 100 ug via INTRAVENOUS
  Administered 2012-11-02: 50 ug via INTRAVENOUS

## 2012-11-02 MED ORDER — 0.9 % SODIUM CHLORIDE (POUR BTL) OPTIME
TOPICAL | Status: DC | PRN
  Administered 2012-11-02: 1000 mL

## 2012-11-02 MED ORDER — LIDOCAINE HCL (CARDIAC) 20 MG/ML IV SOLN
INTRAVENOUS | Status: DC | PRN
  Administered 2012-11-02: 80 mg via INTRAVENOUS

## 2012-11-02 MED ORDER — PROPOFOL 10 MG/ML IV BOLUS
INTRAVENOUS | Status: DC | PRN
  Administered 2012-11-02: 200 mg via INTRAVENOUS
  Administered 2012-11-02: 50 mg via INTRAVENOUS

## 2012-11-02 MED ORDER — ONDANSETRON HCL 4 MG/2ML IJ SOLN
INTRAMUSCULAR | Status: DC | PRN
  Administered 2012-11-02: 4 mg via INTRAVENOUS

## 2012-11-02 MED ORDER — MIDAZOLAM HCL 5 MG/5ML IJ SOLN
INTRAMUSCULAR | Status: DC | PRN
  Administered 2012-11-02: 2 mg via INTRAVENOUS

## 2012-11-02 MED ORDER — BUPIVACAINE-EPINEPHRINE PF 0.5-1:200000 % IJ SOLN
INTRAMUSCULAR | Status: AC
  Filled 2012-11-02: qty 30

## 2012-11-02 MED ORDER — IOHEXOL 300 MG/ML  SOLN
100.0000 mL | Freq: Once | INTRAMUSCULAR | Status: AC | PRN
  Administered 2012-11-02: 100 mL via INTRAVENOUS

## 2012-11-02 MED ORDER — FENTANYL CITRATE 0.05 MG/ML IJ SOLN: 25.0000 ug | INTRAMUSCULAR | Status: DC | PRN

## 2012-11-02 MED ORDER — ARTIFICIAL TEARS OP OINT
TOPICAL_OINTMENT | OPHTHALMIC | Status: DC | PRN
  Administered 2012-11-02: 1 via OPHTHALMIC

## 2012-11-02 MED ORDER — BUPIVACAINE-EPINEPHRINE 0.5% -1:200000 IJ SOLN
INTRAMUSCULAR | Status: DC | PRN
  Administered 2012-11-02: 10 mL

## 2012-11-02 MED ORDER — LIDOCAINE HCL 2 % IJ SOLN: 5.0000 mL | Freq: Once | INTRAMUSCULAR | Status: DC

## 2012-11-02 MED ORDER — SUCCINYLCHOLINE CHLORIDE 20 MG/ML IJ SOLN
INTRAMUSCULAR | Status: DC | PRN
  Administered 2012-11-02: 120 mg via INTRAVENOUS

## 2012-11-02 SURGICAL SUPPLY — 33 items
BANDAGE GAUZE ELAST BULKY 4 IN (GAUZE/BANDAGES/DRESSINGS) IMPLANT
BLADE SURG 10 STRL SS (BLADE) ×2 IMPLANT
CANISTER SUCTION 2500CC (MISCELLANEOUS) ×2 IMPLANT
CLOTH BEACON ORANGE TIMEOUT ST (SAFETY) ×2 IMPLANT
COVER SURGICAL LIGHT HANDLE (MISCELLANEOUS) ×2 IMPLANT
DRAPE UTILITY 15X26 W/TAPE STR (DRAPE) ×4 IMPLANT
DRSG PAD ABDOMINAL 8X10 ST (GAUZE/BANDAGES/DRESSINGS) IMPLANT
ELECT CAUTERY BLADE 6.4 (BLADE) ×2 IMPLANT
ELECT REM PT RETURN 9FT ADLT (ELECTROSURGICAL) ×2
ELECTRODE REM PT RTRN 9FT ADLT (ELECTROSURGICAL) ×1 IMPLANT
GLOVE BIO SURGEON STRL SZ7.5 (GLOVE) ×4 IMPLANT
GLOVE BIOGEL PI IND STRL 7.5 (GLOVE) ×2 IMPLANT
GLOVE BIOGEL PI IND STRL 8 (GLOVE) ×1 IMPLANT
GLOVE BIOGEL PI INDICATOR 7.5 (GLOVE) ×2
GLOVE BIOGEL PI INDICATOR 8 (GLOVE) ×1
GLOVE ECLIPSE 8.0 STRL XLNG CF (GLOVE) ×2 IMPLANT
GOWN STRL NON-REIN LRG LVL3 (GOWN DISPOSABLE) ×6 IMPLANT
KIT BASIN OR (CUSTOM PROCEDURE TRAY) ×2 IMPLANT
KIT ROOM TURNOVER OR (KITS) ×2 IMPLANT
NEEDLE HYPO 25GX1X1/2 BEV (NEEDLE) ×4 IMPLANT
NS IRRIG 1000ML POUR BTL (IV SOLUTION) ×2 IMPLANT
PACK LITHOTOMY IV (CUSTOM PROCEDURE TRAY) ×2 IMPLANT
PAD ARMBOARD 7.5X6 YLW CONV (MISCELLANEOUS) ×2 IMPLANT
PENCIL BUTTON HOLSTER BLD 10FT (ELECTRODE) ×2 IMPLANT
SPONGE GAUZE 4X4 12PLY (GAUZE/BANDAGES/DRESSINGS) ×2 IMPLANT
SWAB COLLECTION DEVICE MRSA (MISCELLANEOUS) IMPLANT
SYR CONTROL 10ML LL (SYRINGE) ×4 IMPLANT
TAPE CLOTH SURG 6X10 WHT LF (GAUZE/BANDAGES/DRESSINGS) ×2 IMPLANT
TOWEL OR 17X24 6PK STRL BLUE (TOWEL DISPOSABLE) ×2 IMPLANT
TOWEL OR 17X26 10 PK STRL BLUE (TOWEL DISPOSABLE) ×2 IMPLANT
TUBE ANAEROBIC SPECIMEN COL (MISCELLANEOUS) IMPLANT
TUBE CONNECTING 12X1/4 (SUCTIONS) ×2 IMPLANT
YANKAUER SUCT BULB TIP NO VENT (SUCTIONS) ×2 IMPLANT

## 2012-11-02 NOTE — ED Notes (Signed)
Pt at bedside to perform I&D of pt abscess.

## 2012-11-02 NOTE — ED Notes (Signed)
IV team to attempt to obtain access for a CT and pain meds.

## 2012-11-02 NOTE — ED Provider Notes (Addendum)
[place] bedside Performed by: Jones Skene Authorized by: Jones Skene Consent: Verbal consent obtained. Consent given by: patient Comments: Difficulty getting IV access, ultrasound use to obtain peripheral IV access with 18-gauge IV in the right antecubital fossa.    Medical screening examination/treatment/procedure(s) were performed by non-physician practitioner and as supervising physician I was immediately available for consultation/collaboration.  Jones Skene, M.D.  Jones Skene, MD 11/02/12 1610  Jones Skene, MD 11/02/12 [id number]

## 2012-11-02 NOTE — Op Note (Signed)
Operative Note  [name] female 52 y.o. 11/02/2012  PREOPERATIVE DX:  Left Anorectal abscess  POSTOPERATIVE DX:  Same  PROCEDURE:   Complex Incision and drainage of left anorectal abscess; anoscopy.         Surgeon: Adolph Pollack   Assistants: none  Anesthesia: General endotracheal anesthesia  Indications:  This is a 52 year old female who developed signs and symptoms of an anorectal abscess 4 days ago. The swelling and pain have increased. The emergency department physician attempted to drainage in the emergency department without success. She is now brought to the operating room for formal incision and drainage.    Procedure Detail:  She is brought to the operating room placed supine on the operating table and a general anesthetic was administered. She was placed in a lithotomy position. The perianal area and buttocks were sterilely prepped and draped.  Using an 18-gauge needle and syringe I found the area of the abscess and aspirated a small amount of pus. I then made a large full thickness elliptical incision around this area exposing a 4 cm deep cavity. Loculations were broken up manually. There is no evidence of fistula externally. Anoscopy was also performed and there is no evidence of fistula internally. Bleeding from the wound was controlled with electrocautery. The bone was then anesthetized with 1/2% Marcaine with epinephrine for local anesthetic affect. The wound was then packed with saline moistened gauze. A dry bulky dressing was applied.  She tolerated the procedure well without any apparent complications and was taken to the recovery room in satisfactory condition.  Estimated Blood Loss:  less than 100 mL         Drains: none  Blood Given: none          Specimens: none        Complications:  * No complications entered in OR log *         Disposition: PACU - hemodynamically stable.         Condition: stable

## 2012-11-02 NOTE — ED Provider Notes (Signed)
Medical screening examination/treatment/procedure(s) were performed by non-physician practitioner and as supervising physician I was immediately available for consultation/collaboration.  Jones Skene, M.D.     Jones Skene, MD 11/02/12 [phone]

## 2012-11-02 NOTE — Anesthesia Procedure Notes (Signed)
Procedure Name: Intubation Date/Time: 11/02/2012 10:25 AM Performed by: Jefm Miles E Pre-anesthesia Checklist: Patient identified, Timeout performed, Emergency Drugs available, Suction available and Patient being monitored Patient Re-evaluated:Patient Re-evaluated prior to inductionOxygen Delivery Method: Circle system utilized Preoxygenation: Pre-oxygenation with 100% oxygen Intubation Type: IV induction, Rapid sequence and Cricoid Pressure applied Laryngoscope Size: Mac and 3 Grade View: Grade I Tube type: Oral Tube size: 7.0 mm Number of attempts: 1 Airway Equipment and Method: Stylet Placement Confirmation: ETT inserted through vocal cords under direct vision,  breath sounds checked- equal and bilateral and positive ETCO2 Secured at: 22 cm Tube secured with: Tape Dental Injury: Teeth and Oropharynx as per pre-operative assessment

## 2012-11-02 NOTE — Preoperative (Signed)
Beta Blockers   Reason not to administer Beta Blockers:Not Applicable 

## 2012-11-02 NOTE — Progress Notes (Addendum)
This patient has received 30 ml's of IV Omnipaque 300contrast extravasation into Left Antecubital during a Pelvis CT exam.  The exam was performed on 11/02/2012  Site / affected area assessed by Dr. Tonia Ghent

## 2012-11-02 NOTE — ED Notes (Addendum)
Ct called and stated that pt IV infiltrated with the contrast. Pt was not able to get CT pelvis W contrast complete. Paged IV team for another IV, spoke to PA about other options with or without IV. Pt aware of ultrasound IV and has ice pack applied to right ac

## 2012-11-02 NOTE — H&P (Signed)
[name] is an 52 y.o. female.   Chief Complaint: Anorectal abscess HPI: She has a history of a left perianal abscess that was drained approximately a year ago. 4 days ago she developed a tender swelling in the left perianal area a persistently worsened. She had some chills but no obvious fever. Prior to this she had been constipated which was similar to what happened last year. She came to the emergency department where an attempted incision and drainage was performed and was not successful. A CT scan demonstrates a left perianal abscess. We were asked to see her. She is very sore there still.  Past Medical History  Diagnosis Date  . Hypertriglyceridemia     89 - 2/11  . Panic disorder 11/05  . Obesity     250 lbs 8/11  . Atypical chest pain     Negative cardiolite at [facility] cards 11/07  . Anemia, iron deficiency     Requried 2u prbc 6/09, menometrorrhagia, uterine fibroids.  . Hypothyroidism   . Insomnia   . Allergic rhinitis   . Elevated BP   . Anginal pain   . Shortness of breath 05/18/2012    "at rest; lying down; w/exertion"  . Perirectal abscess 11/14/2011  . CHOLECYSTECTOMY, HX OF 08/31/2006    Annotation: 2000 Qualifier: Diagnosis of  By: Renae Fickle MD, Bhakti      Past Surgical History  Procedure Date  . Incision and drainage perirectal abscess 11/14/2011    Procedure: IRRIGATION AND DEBRIDEMENT PERIRECTAL ABSCESS;  Surgeon: Liz Malady, MD;  Location: [facility] OR;  Service: General;  Laterality: N/A;  . Cholecystectomy 07/30/1999  . Abdominal hysterectomy 04/2007    total abdominal hysterectomy ( cervix) with bilateral salpingo-oopherectomy  . Dilation and curettage of uterus 1987    Family History  Problem Relation Age of Onset  . Heart attack Mother     at the age of 43's  . Lung cancer Mother     metestatic, + smoker  . Aneurysm Mother   . Hyperlipidemia Mother   . Hypertension Mother   . Hypertension Father   . Heart attack Maternal Grandmother     at the age  of 63's   Social History:  reports that she has never smoked. She has never used smokeless tobacco. She reports that she drinks alcohol. She reports that she does not use illicit drugs.  Prior to Admission medications   Medication Sig Start Date End Date Taking? Authorizing Provider  aspirin 81 MG tablet Take 81 mg by mouth daily.   Yes Historical Provider, MD  fenofibrate (TRICOR) 145 MG tablet Take 1 tablet (145 mg total) by mouth daily. 09/22/12 09/22/13 Yes Na Li, MD  levothyroxine (SYNTHROID, LEVOTHROID) 88 MCG tablet Take 1 tablet (88 mcg total) by mouth daily before breakfast. 10/10/12 10/10/13 Yes Na Dierdre Searles, MD  PARoxetine (PAXIL) 20 MG tablet Take 1 tablet (20 mg total) by mouth daily. 09/22/12  Yes Dede Query, MD     Allergies:  Allergies  Allergen Reactions  . Codeine Itching  . Doxycycline Nausea And Vomiting  . Hydrocodone Itching    All over the body one hour after administration  . Oxycodone Itching    All over the body after an hour of administration.  . Sulfamethoxazole W-Trimethoprim Nausea And Vomiting     (Not in a hospital admission)  Results for orders placed during the hospital encounter of 11/01/12 (from the past 48 hour(s))  POCT I-STAT, CHEM 8  Status: Abnormal   Collection Time   11/02/12  1:16 AM      Component Value Range Comment   Sodium 142  135 - 145 mEq/L    Potassium 3.9  3.5 - 5.1 mEq/L    Chloride 108  96 - 112 mEq/L    BUN 16  6 - 23 mg/dL    Creatinine, Ser 4.09  0.50 - 1.10 mg/dL    Glucose, Bld 811 (*) 70 - 99 mg/dL    Calcium, Ion 9.14  1.12 - 1.23 mmol/L    TCO2 24  0 - 100 mmol/L    Hemoglobin 13.9  12.0 - 15.0 g/dL    HCT 78.2  95.6 - 21.3 %    Ct Pelvis W Contrast  11/02/2012  *RADIOLOGY REPORT*  Clinical Data:  Recurrent perirectal abscess, with rectal pain.  CT PELVIS WITH CONTRAST  Technique:  Multidetector CT imaging of the pelvis was performed using the standard protocol following the bolus administration of intravenous contrast.   Contrast: OMNIPAQUE IOHEXOL 300 MG/ML  SOLN  Comparison:  CT of the pelvis performed 11/14/2011  Findings:  There is a recurrent left perianal abscess, measuring 4.2 x 2.7 x 2.5 cm.  Significant associated soft tissue inflammation is seen, with relatively diffuse abscess wall thickening.  Mild soft tissue inflammation is noted extending along the medial left buttock.  A mildly enlarged left inguinal node is seen, measuring 1.4 cm in short axis.  No additional lymphadenopathy is characterized.  The visualized small and large bowel are grossly unremarkable. Contrast is noted within the bladder; the bladder is mildly distended.  The patient is status post hysterectomy; no suspicious adnexal masses are seen.  No acute osseous abnormalities are identified.  IMPRESSION:  1.  Recurrent left perianal abscess, measuring 4.7 x 2.7 x 2.5 cm. This is just distal to the anorectal canal.  Significant associated soft tissue inflammation seen, extending along the medial left buttock. 2.  Mildly enlarged left inguinal node seen.   Original Report Authenticated By: Tonia Ghent, M.D.     Review of Systems  Constitutional: Positive for chills. Negative for fever.  Respiratory: Negative for shortness of breath.   Cardiovascular: Negative for chest pain.  Gastrointestinal: Positive for constipation. Negative for abdominal pain.  Genitourinary: Negative for dysuria.    Blood pressure 143/83, pulse 96, temperature 98.7 F (37.1 C), temperature source Oral, resp. rate 18, last menstrual period 06/17/2007, SpO2 99.00%. Physical Exam  Constitutional: No distress.       Morbidly obese female  HENT:  Head: Normocephalic and atraumatic.  Cardiovascular: Normal rate and regular rhythm.   Respiratory: Effort normal and breath sounds normal.  GI: Soft. There is no tenderness.       Multiple small upper abdominal scars. Lower abdominal scar.  Musculoskeletal: She exhibits no edema.  Neurological: She is alert.    Psychiatric: She has a normal mood and affect. Her behavior is normal.     Assessment/Plan Left anorectal abscess-unable to be drained by EDP.  Plan: Will take her to the operating room for incision and drainage of left anorectal abscess.  Procedure and risks were discussed with her. Risks include but are not limited to bleeding, recurrent infection, wound healing problems, complications of anesthesia. She seems to understand this and agrees with the plan. She may be able to go home later today and this was discussed with her.  Arsalan Brisbin J 11/02/2012, 7:46 AM

## 2012-11-02 NOTE — ED Provider Notes (Signed)
History     CSN: [id number]  Arrival date & time 11/01/12  2334   First MD Initiated Contact with Patient 11/01/12 2352      Chief Complaint  Patient presents with  . Recurrent Skin Infections    (Consider location/radiation/quality/duration/timing/severity/associated sxs/prior treatment) HPI History provided by pt.   Pt presents w/ abscess of L buttock x 4 days.  Pain gradually increasing.  No drainage.  No associated fever.  No difficulty w/ bowel movements.  Per prior chart, pt went to OR for drainage of perirectal abscess that tracked perianal/ischioanal fat last year.     Past Medical History  Diagnosis Date  . Hypertriglyceridemia     164 - 2/11  . Panic disorder 11/05  . Obesity     250 lbs 8/11  . Atypical chest pain     Negative cardiolite at [facility] cards 11/07  . Anemia, iron deficiency     Requried 2u prbc 6/09, menometrorrhagia, uterine fibroids.  . Hypothyroidism   . Insomnia   . Allergic rhinitis   . Elevated BP   . Anginal pain   . Shortness of breath 05/18/2012    "at rest; lying down; w/exertion"  . Perirectal abscess 11/14/2011  . CHOLECYSTECTOMY, HX OF 08/31/2006    Annotation: 2000 Qualifier: Diagnosis of  By: Renae Fickle MD, Bhakti      Past Surgical History  Procedure Date  . Incision and drainage perirectal abscess 11/14/2011    Procedure: IRRIGATION AND DEBRIDEMENT PERIRECTAL ABSCESS;  Surgeon: Liz Malady, MD;  Location: [facility] OR;  Service: General;  Laterality: N/A;  . Cholecystectomy 07/30/1999  . Abdominal hysterectomy 04/2007    total abdominal hysterectomy ( cervix) with bilateral salpingo-oopherectomy  . Dilation and curettage of uterus 1987    Family History  Problem Relation Age of Onset  . Heart attack Mother     at the age of 60's  . Lung cancer Mother     metestatic, + smoker  . Aneurysm Mother   . Hyperlipidemia Mother   . Hypertension Mother   . Hypertension Father   . Heart attack Maternal Grandmother     at the age of 27's     History  Substance Use Topics  . Smoking status: Never Smoker   . Smokeless tobacco: Never Used  . Alcohol Use: Yes     Comment: 05/18/12 "have a taste of a drink maybe once/yr"    OB History    Grav Para Term Preterm Abortions TAB SAB Ect Mult Living                  Review of Systems  All other systems reviewed and are negative.    Allergies  Codeine; Doxycycline; Hydrocodone; Oxycodone; and Sulfamethoxazole w-trimethoprim  Home Medications   Current Outpatient Rx  Name  Route  Sig  Dispense  Refill  . ASPIRIN 81 MG PO TABS   Oral   Take 81 mg by mouth daily.         . FENOFIBRATE 145 MG PO TABS   Oral   Take 1 tablet (145 mg total) by mouth daily.   30 tablet   11   . LEVOTHYROXINE SODIUM 88 MCG PO TABS   Oral   Take 1 tablet (88 mcg total) by mouth daily before breakfast.   30 tablet   11   . PAROXETINE HCL 20 MG PO TABS   Oral   Take 1 tablet (20 mg total) by mouth daily.   90  tablet   4     BP 159/101  Pulse 118  Temp 98.3 F (36.8 C) (Oral)  Resp 16  SpO2 99%  LMP 06/17/2007  Physical Exam  Nursing note and vitals reviewed. Constitutional: She is oriented to person, place, and time. She appears well-developed and well-nourished. No distress.  HENT:  Head: Normocephalic and atraumatic.  Eyes:       Normal appearance  Neck: Normal range of motion.  Cardiovascular: Normal rate and regular rhythm.   Pulmonary/Chest: Effort normal and breath sounds normal. No respiratory distress.  Genitourinary:       1.5cm, non-draining abscess of left medial lower buttock.  Surrounding induration and erythema, approx 6cm in diameter, that extends up to perianal area.  Nml rectum.    Musculoskeletal: Normal range of motion.  Neurological: She is alert and oriented to person, place, and time.  Skin: Skin is warm and dry. No rash noted.  Psychiatric: She has a normal mood and affect. Her behavior is normal.    ED Course  Procedures (including  critical care time)  INCISION AND DRAINAGE Performed by: Ruby Cola E Consent: Verbal consent obtained. Risks and benefits: risks, benefits and alternatives were discussed Type: abscess  Body area: L buttock  Anesthesia: local infiltration  Incision was made with a scalpel.  Local anesthetic: lidocaine 2% w/out epinephrine  Anesthetic total: 6 ml  Complexity: complex Blunt dissection to break up loculations  Drainage: none  Packing material: none  Patient tolerance: Patient did not tolerate procedure well but no immediate complications.    Labs Reviewed  POCT I-STAT, CHEM 8 - Abnormal; Notable for the following:    Glucose, Bld 104 (*)     All other components within normal limits   Ct Pelvis W Contrast  11/02/2012  *RADIOLOGY REPORT*  Clinical Data:  Recurrent perirectal abscess, with rectal pain.  CT PELVIS WITH CONTRAST  Technique:  Multidetector CT imaging of the pelvis was performed using the standard protocol following the bolus administration of intravenous contrast.  Contrast: OMNIPAQUE IOHEXOL 300 MG/ML  SOLN  Comparison:  CT of the pelvis performed 11/14/2011  Findings:  There is a recurrent left perianal abscess, measuring 4.2 x 2.7 x 2.5 cm.  Significant associated soft tissue inflammation is seen, with relatively diffuse abscess wall thickening.  Mild soft tissue inflammation is noted extending along the medial left buttock.  A mildly enlarged left inguinal node is seen, measuring 1.4 cm in short axis.  No additional lymphadenopathy is characterized.  The visualized small and large bowel are grossly unremarkable. Contrast is noted within the bladder; the bladder is mildly distended.  The patient is status post hysterectomy; no suspicious adnexal masses are seen.  No acute osseous abnormalities are identified.  IMPRESSION:  1.  Recurrent left perianal abscess, measuring 4.7 x 2.7 x 2.5 cm. This is just distal to the anorectal canal.  Significant  associated soft tissue inflammation seen, extending along the medial left buttock. 2.  Mildly enlarged left inguinal node seen.   Original Report Authenticated By: Tonia Ghent, M.D.      1. Perianal abscess       MDM  [phone] F presents w/ perianal abscess.  H/o same; Had I&D in OR in 11/2011 d/t tracking into perianal fat.  CT pelvis pending to r/o same.  If neg, will I&D in ED. Pt to receive IV morphine for pain.    CT shows recurrent perianal abscess; no communication w/ rectum.  I attempted to I&D  but no drainage of pus and pt did not tolerate well.  Consulted General Surgery and they will admit.  6:35 AM           Otilio Miu, PA-C 11/02/12 [phone]

## 2012-11-02 NOTE — ED Notes (Signed)
Patient transported to X-ray 

## 2012-11-02 NOTE — Transfer of Care (Signed)
Immediate Anesthesia Transfer of Care Note  Patient: [name]  Procedure(s) Performed: Procedure(s) (LRB) with comments: INCISION AND DRAINAGE ABSCESS (N/A) - I&D anorectal abscess  Patient Location: PACU  Anesthesia Type:General  Level of Consciousness: awake, alert  and oriented  Airway & Oxygen Therapy: Patient Spontanous Breathing and Patient connected to nasal cannula oxygen  Post-op Assessment: Report given to PACU RN  Post vital signs: Reviewed and stable  Complications: No apparent anesthesia complications

## 2012-11-02 NOTE — Anesthesia Postprocedure Evaluation (Signed)
  Anesthesia Post-op Note  Patient: [name]  Procedure(s) Performed: Procedure(s) (LRB) with comments: INCISION AND DRAINAGE ABSCESS (N/A) - I&D anorectal abscess  Patient Location: PACU  Anesthesia Type:General  Level of Consciousness: awake  Airway and Oxygen Therapy: Patient Spontanous Breathing  Post-op Pain: mild  Post-op Assessment: Post-op Vital signs reviewed  Post-op Vital Signs: Reviewed  Complications: No apparent anesthesia complications

## 2012-11-02 NOTE — ED Notes (Signed)
General surgery at bedside speaking with pt.

## 2012-11-02 NOTE — Anesthesia Preprocedure Evaluation (Addendum)
Anesthesia Evaluation  Patient identified by MRN, date of birth, ID band Patient awake    Reviewed: Allergy & Precautions, H&P , NPO status , Patient's Chart, lab work & pertinent test results, reviewed documented beta blocker date and time   Airway Mallampati: II TM Distance: >3 FB Neck ROM: Full    Dental  (+) Edentulous Upper, Edentulous Lower and Dental Advisory Given   Pulmonary shortness of breath and with exertion,  breath sounds clear to auscultation        Cardiovascular + angina with exertion Rhythm:Regular Rate:Normal     Neuro/Psych Anxiety Depression    GI/Hepatic Neg liver ROS,   Endo/Other  Hypothyroidism   Renal/GU negative Renal ROS     Musculoskeletal   Abdominal (+)  Abdomen: soft. Bowel sounds: normal.  Peds  Hematology   Anesthesia Other Findings   Reproductive/Obstetrics                        Anesthesia Physical Anesthesia Plan  ASA: III  Anesthesia Plan: General   Post-op Pain Management:    Induction: Intravenous  Airway Management Planned: LMA  Additional Equipment:   Intra-op Plan:   Post-operative Plan: Extubation in OR  Informed Consent: I have reviewed the patients History and Physical, chart, labs and discussed the procedure including the risks, benefits and alternatives for the proposed anesthesia with the patient or authorized representative who has indicated his/her understanding and acceptance.   Dental advisory given  Plan Discussed with: Anesthesiologist, Surgeon and CRNA  Anesthesia Plan Comments:        Anesthesia Quick Evaluation

## 2012-11-03 NOTE — Progress Notes (Signed)
The left antecubital fossa is mildly indurated, with mild patient-described soreness at this location.  No significant erythema is seen.  An ice pack has already been given to the patient, which she is applying as instructed.

## 2012-11-04 ENCOUNTER — Encounter (HOSPITAL_COMMUNITY): Payer: Self-pay | Admitting: General Surgery

## 2012-11-21 ENCOUNTER — Encounter (INDEPENDENT_AMBULATORY_CARE_PROVIDER_SITE_OTHER): Payer: Self-pay | Admitting: General Surgery

## 2012-11-23 ENCOUNTER — Encounter (INDEPENDENT_AMBULATORY_CARE_PROVIDER_SITE_OTHER): Payer: Self-pay | Admitting: General Surgery

## 2012-11-24 ENCOUNTER — Encounter: Payer: No Typology Code available for payment source | Admitting: Internal Medicine

## 2012-11-28 ENCOUNTER — Encounter: Payer: No Typology Code available for payment source | Admitting: Genetic Counselor

## 2012-11-28 ENCOUNTER — Other Ambulatory Visit: Payer: No Typology Code available for payment source

## 2012-11-28 NOTE — Addendum Note (Signed)
Addended by: Neomia Dear on: 11/28/2012 07:21 PM   Modules accepted: Orders

## 2012-12-22 ENCOUNTER — Encounter: Payer: No Typology Code available for payment source | Admitting: Internal Medicine

## 2013-01-05 ENCOUNTER — Ambulatory Visit (INDEPENDENT_AMBULATORY_CARE_PROVIDER_SITE_OTHER): Payer: No Typology Code available for payment source | Admitting: Internal Medicine

## 2013-01-05 VITALS — BP 120/80 | HR 80 | Temp 97.8°F | Ht 61.0 in | Wt 270.0 lb

## 2013-01-05 DIAGNOSIS — Z79899 Other long term (current) drug therapy: Secondary | ICD-10-CM

## 2013-01-05 DIAGNOSIS — E041 Nontoxic single thyroid nodule: Secondary | ICD-10-CM

## 2013-01-05 DIAGNOSIS — E039 Hypothyroidism, unspecified: Secondary | ICD-10-CM

## 2013-01-05 DIAGNOSIS — R7309 Other abnormal glucose: Secondary | ICD-10-CM

## 2013-01-05 DIAGNOSIS — E785 Hyperlipidemia, unspecified: Secondary | ICD-10-CM

## 2013-01-05 DIAGNOSIS — R809 Proteinuria, unspecified: Secondary | ICD-10-CM

## 2013-01-05 DIAGNOSIS — M542 Cervicalgia: Secondary | ICD-10-CM | POA: Insufficient documentation

## 2013-01-05 DIAGNOSIS — R7303 Prediabetes: Secondary | ICD-10-CM

## 2013-01-05 MED ORDER — FENOFIBRATE 145 MG PO TABS: 145.0000 mg | ORAL_TABLET | Freq: Every day | ORAL | Status: DC

## 2013-01-05 MED ORDER — IBUPROFEN 200 MG PO TABS: 400.0000 mg | ORAL_TABLET | Freq: Three times a day (TID) | ORAL | Status: DC | PRN
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
## 2013-01-05 NOTE — Patient Instructions (Addendum)
1. Follow up in 3 months 2. Will check your TSH, Free T4, 24 hour urine Pro and CrCl, HGB A1C 3. Use warm compress, ibuprofen and neck collar 4. See me if your neck pain is not better or getting worse.

## 2013-01-08 DIAGNOSIS — E041 Nontoxic single thyroid nodule: Secondary | ICD-10-CM

## 2013-01-08 HISTORY — DX: Nontoxic single thyroid nodule: E04.1

## 2013-01-08 NOTE — Assessment & Plan Note (Addendum)
Synthroid dosage was increased to 88 mcg in Dec 2013 and she did not show up for the followup appt on 11/24/12.  She states that she is doing ok and compliant with the treatment.  Results for KALLEE, NAM (MRN [id number]) as of 01/08/2013 12:32  Ref. Range 09/22/2012 13:49 01/05/2013 14:32  TSH Latest Range: 0.350-4.500 uIU/mL 6.509 (H) 8.210 (H)  Free T4 Latest Range: 0.80-1.80 ng/dL 4.09 8.11   -  Patient states that she tries to take levothyroxine daily but may miss a couple of doses at times. She also states that she takes the pill at 8-9 am right before her breakfast. - I instructed her that she needs to be more compliant with her medicine and take it daily - I also instructed her that she needs to take the med on an empty stomach. I asked her to take it at 6 am - given her noncompliance and possible decreased med absorption since it was taken too close to her meals, I will keep her at the same dosage and repeat the TFT in 6 weeks. I will order TFT to be checked in mid May. - I called patient and discussed above infor with her.

## 2013-01-08 NOTE — Assessment & Plan Note (Signed)
Thyroid ultrasound confirmed. She has no personal history or risk factors. Denies family history. No indication from her thyroid ultrasound for biopsy.

## 2013-01-08 NOTE — Assessment & Plan Note (Signed)
Given her significantly elevated spot urine protein, will evaluate 24 hours urine protein.

## 2013-01-08 NOTE — Assessment & Plan Note (Signed)
Highest A1C was 6.0. Repeat A1C is 5.2.
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
## 2013-01-09 ENCOUNTER — Other Ambulatory Visit (INDEPENDENT_AMBULATORY_CARE_PROVIDER_SITE_OTHER): Payer: No Typology Code available for payment source

## 2013-01-09 DIAGNOSIS — R809 Proteinuria, unspecified: Secondary | ICD-10-CM

## 2013-01-10 LAB — CREATININE CLEARANCE, URINE, 24 HOUR
Creatinine Clearance: 80 mL/min (ref 75–115)
Creatinine, 24H Ur: 962 mg/d (ref 700–1800)
Creatinine, Urine: 120.3 mg/dL
Creatinine: 0.84 mg/dL (ref 0.50–1.10)

## 2013-01-10 LAB — PROTEIN, URINE, 24 HOUR: Protein, 24H Urine: 840 mg/d — ABNORMAL HIGH (ref 50–100)

## 2013-01-13 ENCOUNTER — Other Ambulatory Visit: Payer: Self-pay | Admitting: Internal Medicine

## 2013-01-13 DIAGNOSIS — R809 Proteinuria, unspecified: Secondary | ICD-10-CM

## 2013-01-13 NOTE — Assessment & Plan Note (Signed)
24 hour protein is 840 mg  I will order a kidney ultrasound I will send a nephrology referral

## 2013-01-14 ENCOUNTER — Telehealth: Payer: Self-pay | Admitting: Internal Medicine

## 2013-01-14 NOTE — Telephone Encounter (Signed)
Erroneous encounter

## 2013-01-20 ENCOUNTER — Telehealth: Payer: Self-pay | Admitting: *Deleted

## 2013-01-20 NOTE — Telephone Encounter (Signed)
Fax from [employer] - wants to know if Levothyroxin 88 mcg to be changed to the generic Sandoz?  Thanks

## 2013-01-20 NOTE — Telephone Encounter (Signed)
Fax from [employer] - wants to know if Levothyroxin can be changed to the generic Sandoz.  Thanks

## 2013-01-23 NOTE — Telephone Encounter (Signed)
  INTERNAL MEDICINE RESIDENCY PROGRAM After-Hours Telephone Call    Reason for call:  I placed an outgoing call to [name] at 930 am regarding her hypothyroidism treatment. I received a request from patient pharmacy about changing her levothyroxine to generic Sandoz. I called her pharmacist who stated that the bioavailability of these two medications was same. I called patient this morning who stated that she would like to try this generic Sandoz ( she reports that she already received generic Sandoz).  I discussed with her the possibility of different bioavailability. Patient understanded it.    Last encounter / Pertinent Data:   01/05/13 - Last seen in the clinic by me for follow up.     Assessment/ Plan:   Patient understands s/s of hyperthyroidism including palpitation, chest pain, weight and appetite change, nervousness, anxiety and irritability. She understands that she should call the clinic with any concerns or go to ED for urgent issues.   I will continue to monitor her TFT.    Dede Query, MD   01/23/2013, 9:33 AM

## 2013-02-02 ENCOUNTER — Telehealth: Payer: Self-pay | Admitting: Internal Medicine

## 2013-02-02 ENCOUNTER — Encounter: Payer: Self-pay | Admitting: Internal Medicine

## 2013-02-03 NOTE — Telephone Encounter (Signed)
I tried to call patient a couple of times with regards to her questions on her proteinuria and possible kidney stones. I am unable to reach patient. I left the phone message for her to either call my nurse Mamie who could page me or schedule an appt with me for detailed discussion if she wishes.  I will route this message to my nurse Mamie.

## 2013-02-06 NOTE — Addendum Note (Signed)
Addended by: Remus Blake on: 02/06/2013 12:12 PM   Modules accepted: Orders

## 2013-02-16 ENCOUNTER — Encounter: Payer: Self-pay | Admitting: Internal Medicine

## 2013-03-02 ENCOUNTER — Encounter: Payer: Self-pay | Admitting: Internal Medicine

## 2013-03-19 IMAGING — CR DG CHEST 2V
2 series · 2 of 2 positions shown · non-contrast
Comparison: 02/21/2012

CLINICAL DATA: Recurrent skin infections.

CHEST - 2 VIEW

[w chest lat (1 of 2)]
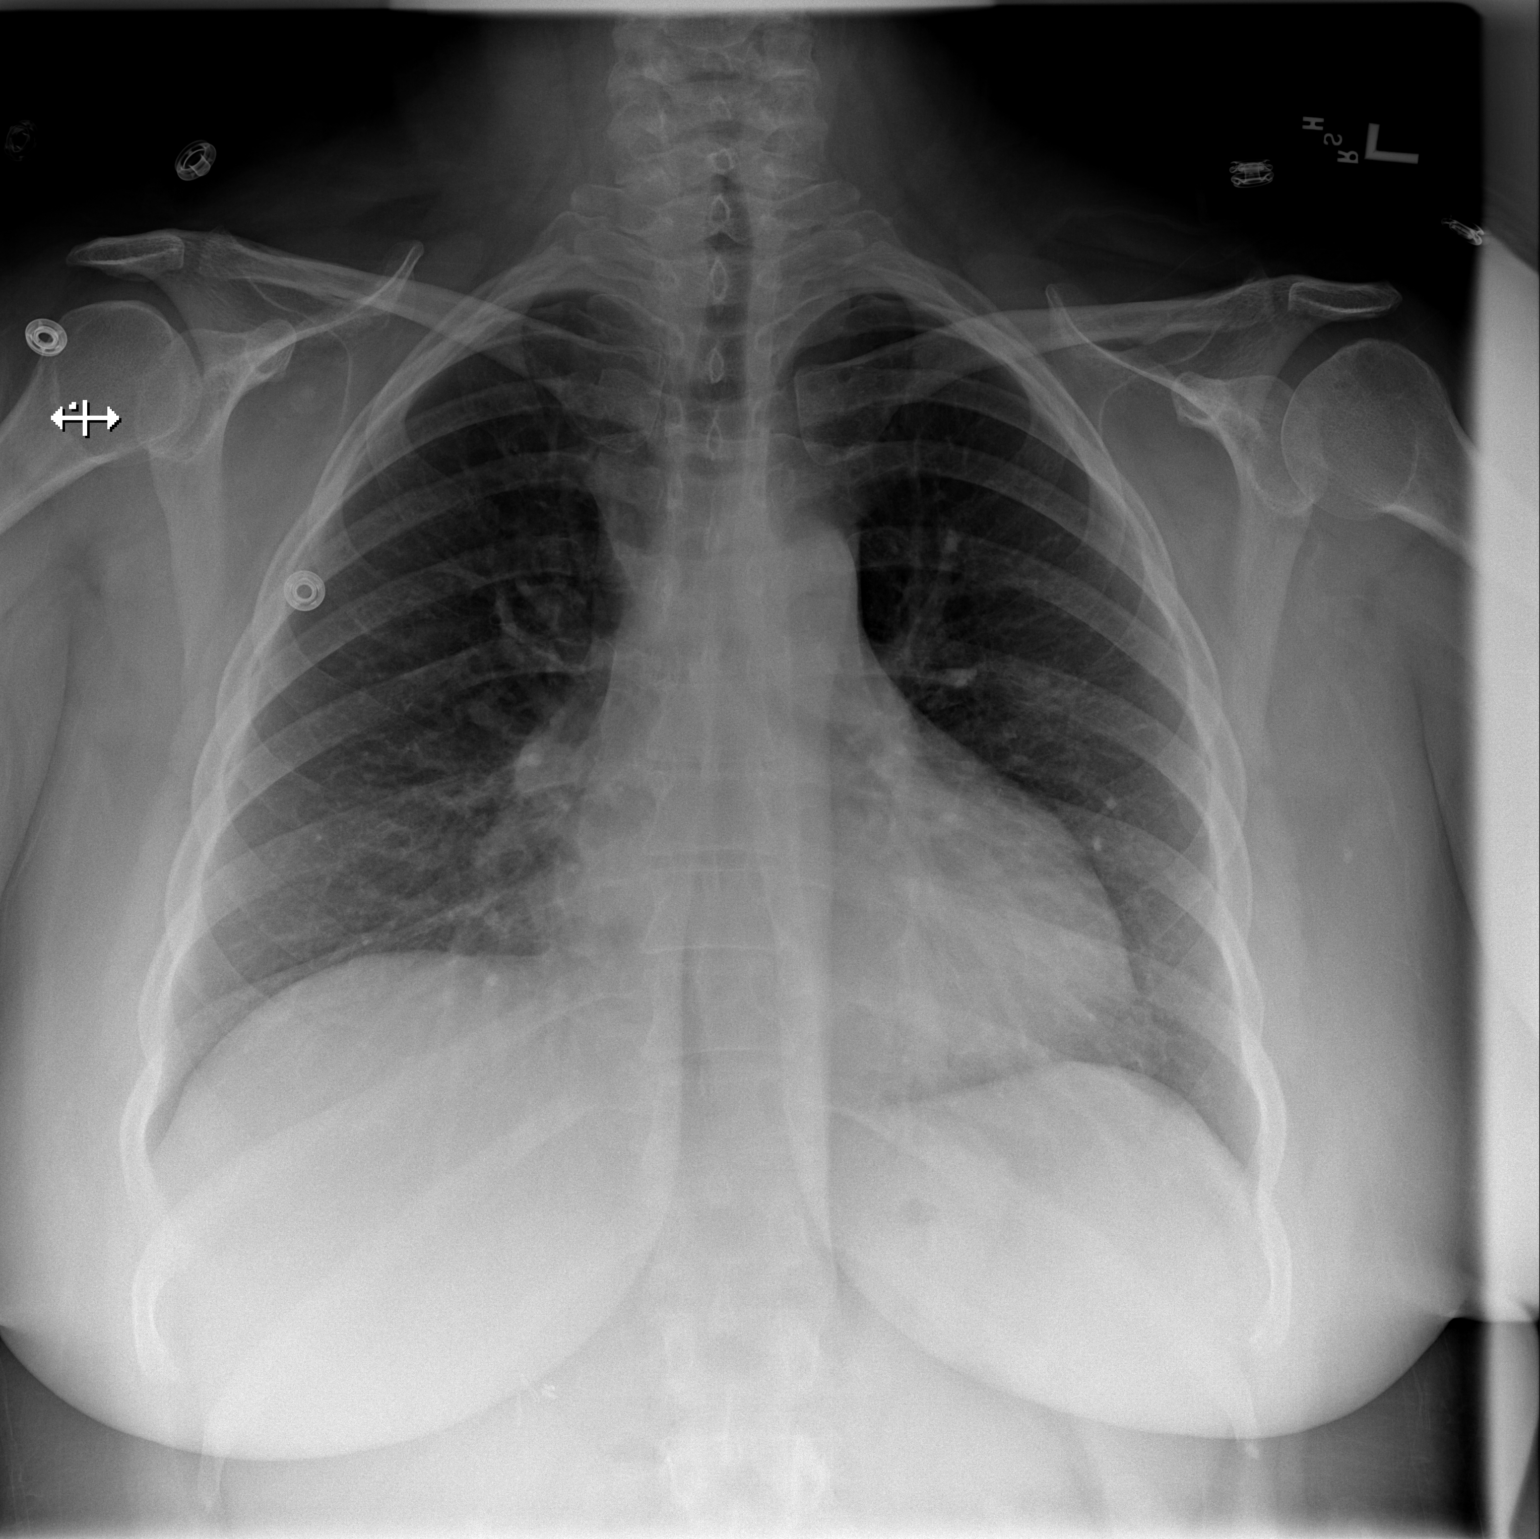

[w chest lat (2 of 2)]
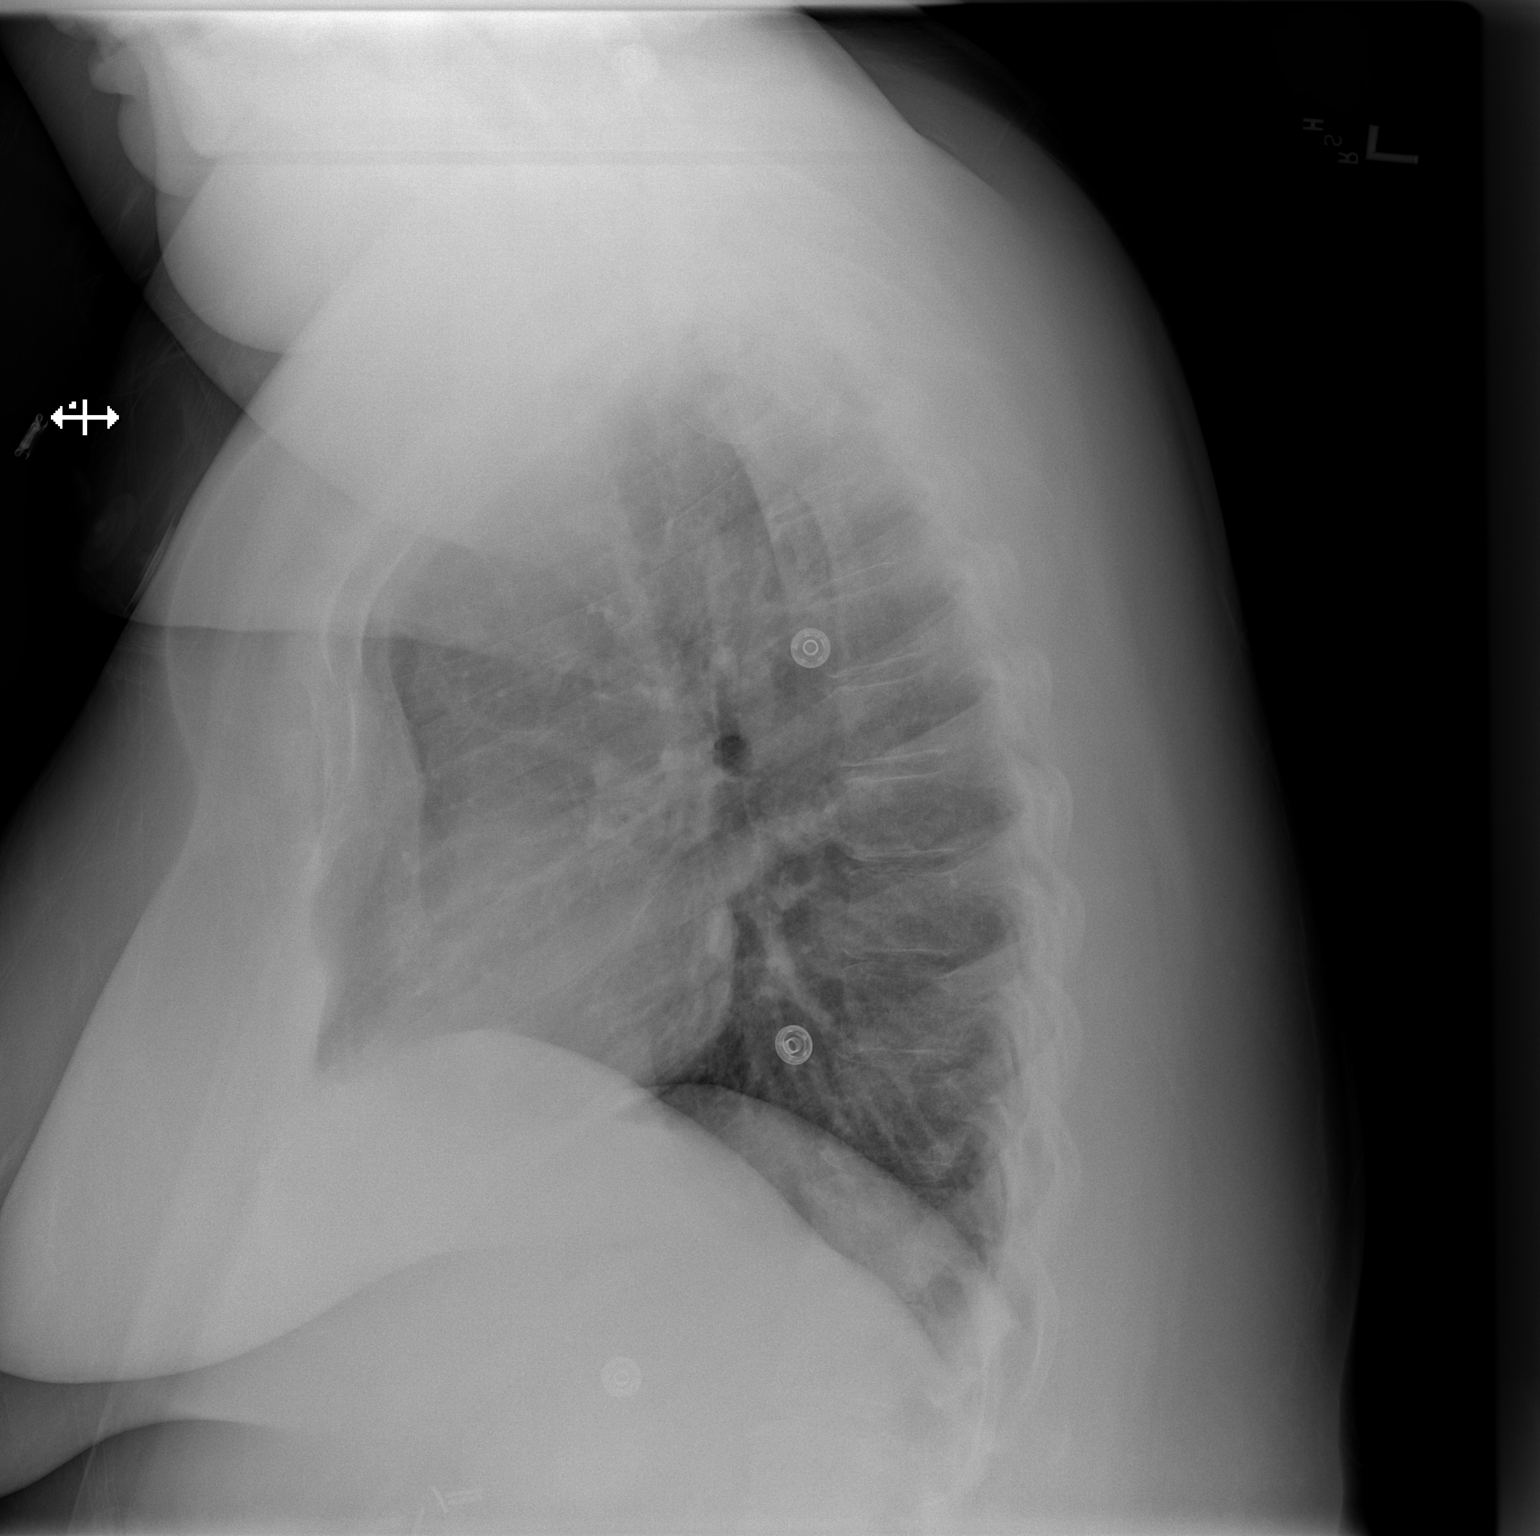

[2 of 2 positions shown; findings below may reference images not displayed]

FINDINGS: Heart is borderline in size.  No focal airspace
opacities, effusions or edema.  No acute bony abnormality.
IMPRESSION: No acute cardiopulmonary disease.

## 2013-03-23 ENCOUNTER — Telehealth: Payer: Self-pay | Admitting: Internal Medicine

## 2013-03-23 NOTE — Telephone Encounter (Signed)
INTERNAL MEDICINE RESIDENCY PROGRAM After-Hours Telephone Call    Reason for call:   I placed a call from [name] on 03/23/2013 at 6:42 PM . I reminded patient that she would need to come to the clinic for TFT check. And she did not show up for two appts in May. I asked her to come to see me in 1-2 weeks.   Patient states that she will come to the clinic for blood drawl. And she will call Monday to set up appt with me.   I will discuss with my nurse mamie about the kidney ultrasound arrangement.     Dede Query, MD   03/23/2013, 6:42 PM

## 2013-04-05 ENCOUNTER — Other Ambulatory Visit (INDEPENDENT_AMBULATORY_CARE_PROVIDER_SITE_OTHER): Payer: No Typology Code available for payment source

## 2013-04-05 DIAGNOSIS — E039 Hypothyroidism, unspecified: Secondary | ICD-10-CM

## 2013-04-05 LAB — TSH: TSH: 7.779 u[IU]/mL — ABNORMAL HIGH (ref 0.350–4.500)

## 2013-04-06 ENCOUNTER — Ambulatory Visit (INDEPENDENT_AMBULATORY_CARE_PROVIDER_SITE_OTHER): Payer: No Typology Code available for payment source | Admitting: Internal Medicine

## 2013-04-06 ENCOUNTER — Encounter: Payer: Self-pay | Admitting: Internal Medicine

## 2013-04-06 VITALS — BP 130/80 | HR 80 | Temp 98.4°F | Ht 61.0 in | Wt 259.7 lb

## 2013-04-06 DIAGNOSIS — M79662 Pain in left lower leg: Secondary | ICD-10-CM

## 2013-04-06 DIAGNOSIS — R809 Proteinuria, unspecified: Secondary | ICD-10-CM

## 2013-04-06 DIAGNOSIS — M79661 Pain in right lower leg: Secondary | ICD-10-CM | POA: Insufficient documentation

## 2013-04-06 DIAGNOSIS — E039 Hypothyroidism, unspecified: Secondary | ICD-10-CM

## 2013-04-06 DIAGNOSIS — F410 Panic disorder [episodic paroxysmal anxiety] without agoraphobia: Secondary | ICD-10-CM

## 2013-04-06 DIAGNOSIS — M79609 Pain in unspecified limb: Secondary | ICD-10-CM

## 2013-04-06 LAB — BASIC METABOLIC PANEL WITH GFR
BUN: 15 mg/dL (ref 6–23)
Calcium: 9.3 mg/dL (ref 8.4–10.5)
Creat: 1.1 mg/dL (ref 0.50–1.10)
GFR, Est African American: 68 mL/min
GFR, Est Non African American: 59 mL/min — ABNORMAL LOW
Glucose, Bld: 78 mg/dL (ref 70–99)

## 2013-04-06 MED ORDER — PAROXETINE HCL 20 MG PO TABS: 20.0000 mg | ORAL_TABLET | Freq: Every day | ORAL | Status: DC

## 2013-04-06 MED ORDER — LEVOTHYROXINE SODIUM 75 MCG PO TABS: 75.0000 ug | ORAL_TABLET | Freq: Every day | ORAL | Status: DC

## 2013-04-06 NOTE — Assessment & Plan Note (Addendum)
Patient presents with B/L LE episodic calf pain with ambulation. Associated symptoms including palpitation and SOB lasting seconds. Her symptom are not reproducible with each ambulation.  physical exam is unremarkable except for her obesity.   - The clinical manifestation is very atypical for claudication. But given her mild decreased DP pulses on exam, will order ABI  - will check her BMP and Mg for electrolyte monitoring - DVT/PE is unlikely since she has no precipitating factors, equivocal SOB/palpitation lasting only seconds, physical exam revealing no s/s DVT, and her Geneva score is 3 by HR of 80, which indicates low probability.   - Patient is instructed to call the clinic if she has worsening of shortness of breath or palpitations.

## 2013-04-06 NOTE — Progress Notes (Signed)
Subjective:   Patient ID: [name] female   DOB: 03/27/1961 52 y.o.   MRN: [id number]  Chief complains: calf pain and follow up on Thyroid function  HPI: [name] is a 52 y.o. women with a past medical history of Obesity, HTN, HLD, hypothyroidism, panic disorder, presents to the outpatient clinic for follow up visit.  # B/L LE calf pain  Patient states that she has had three episodes of B/L calf pain when walking 1-2 blocks over last ten days. Her calf pain would last for 2-3 minutes and resolve with rest. She does not have consistent calf pain with ambulation.   The associated symptoms include palpitation and SOB only lasting for seconds before spontaneous resolution. Denies trauma or injury. Denies long distance travel. Denies leg redness or swelling. Denies history of Tobacco abuse. Denies family  history of PAD. .   # Hypothyroidism Her synthroid dose was increased 88 mcg daily since last December 2013.  Her dosage was not adjusted since she either missed the appts or not taking medication consistently. She missed TFT followup in May and is here for follow up. She states that she would have intermittent chest pain and palpitation associated with synthroid 88 mcg. She does not have chest pain or palpitation when not taking synthroid 88 mcg. She would like to see if she could go back to her old dosage of 75 mcg daily. She has been off synthroid for a couple of weeks.    Health maintenance -Patient had TAH ( Cervix removed) and BSO in 2009 and last Pap smear was normal in 2009. Marland Kitchen    Past Medical History  Diagnosis Date  . Hypertriglyceridemia     164 - 2/11  . Panic disorder 11/05  . Obesity     250 lbs 8/11  . Atypical chest pain     Negative cardiolite at [facility] cards 11/07  . Anemia, iron deficiency     Requried 2u prbc 6/09, menometrorrhagia, uterine fibroids.  . Hypothyroidism   . Insomnia   . Allergic rhinitis   . Elevated BP   . Anginal pain   .  Shortness of breath 05/18/2012    "at rest; lying down; w/exertion"  . Perirectal abscess 11/14/2011  . CHOLECYSTECTOMY, HX OF 08/31/2006    Annotation: 2000 Qualifier: Diagnosis of  By: Renae Fickle MD, Bhakti     Current Outpatient Prescriptions  Medication Sig Dispense Refill  . aspirin 81 MG tablet Take 81 mg by mouth daily.      . fenofibrate (TRICOR) 145 MG tablet Take 1 tablet (145 mg total) by mouth daily.  30 tablet  11  . ibuprofen (MOTRIN IB) 200 MG tablet Take 2 tablets (400 mg total) by mouth every 8 (eight) hours as needed for pain.  100 tablet  2  . levothyroxine (SYNTHROID, LEVOTHROID) 88 MCG tablet Take 1 tablet (88 mcg total) by mouth daily before breakfast.  30 tablet  11  . PARoxetine (PAXIL) 20 MG tablet Take 1 tablet (20 mg total) by mouth daily.  90 tablet  4   No current facility-administered medications for this visit.   Family History  Problem Relation Age of Onset  . Heart attack Mother     at the age of 56's  . Lung cancer Mother     metestatic, + smoker  . Aneurysm Mother   . Hyperlipidemia Mother   . Hypertension Mother   . Hypertension Father   . Heart attack Maternal Grandmother  at the age of 18's   History   Social History  . Marital Status: Married    Spouse Name: N/A    Number of Children: N/A  . Years of Education: N/A   Social History Main Topics  . Smoking status: Never Smoker   . Smokeless tobacco: Never Used  . Alcohol Use: Yes     Comment: 05/18/12 "have a taste of a drink maybe once/yr"  . Drug Use: No  . Sexually Active: Not Currently   Other Topics Concern  . None   Social History Narrative   Currently unemployed. Starting college in Aug for accounting.  Married, has 2 children lives with her youngest son.     Review of Systems: Review of Systems:  Constitutional:  Denies fever, chills, diaphoresis, appetite change and fatigue.   HEENT:  Denies congestion, sore throat, rhinorrhea, sneezing, mouth sores, trouble swallowing, neck  pain   Respiratory:  Denies SOB, DOE, cough, and wheezing.   Cardiovascular:  Denies palpitations and leg swelling.   Gastrointestinal:  Denies nausea, vomiting, abdominal pain, diarrhea, constipation, blood in stool and abdominal distention.   Genitourinary:  Denies dysuria, urgency, frequency, hematuria, flank pain and difficulty urinating.   Musculoskeletal:  Denies myalgias, back pain.   Skin:  Denies pallor, rash and wound.   Neurological:  Denies dizziness, seizures, syncope, weakness, light-headedness, numbness and headaches.    .   Objective:  Physical Exam: Filed Vitals:   04/06/13 1323  BP: 130/80  Pulse: 80  Temp: 98.4 F (36.9 C)  TempSrc: Oral  Height: 5\' 1"  (1.549 m)  Weight: 259 lb 11.2 oz (117.799 kg)  SpO2: 100%   General: alert, well-developed, and cooperative to examination.  Head: normocephalic and atraumatic.  Eyes: vision grossly intact, pupils equal, pupils round, pupils reactive to light, no injection and anicteric.  Mouth: pharynx pink and moist, no erythema, and no exudates.  Neck: supple, full ROM, no thyromegaly, no JVD, and no carotid bruits.  Lungs: normal respiratory effort, no accessory muscle use, normal breath sounds, no crackles, and no wheezes. Heart: normal rate, regular rhythm, no murmur, no gallop, and no rub.  Abdomen: soft, non-tender, normal bowel sounds, no distention, no guarding, no rebound tenderness, no hepatomegaly, and no splenomegaly.  Msk: no joint swelling, no joint warmth, and no redness over joints. Horman negative Pulses: 1+ DP/PT pulses bilaterally. Cap refill < 2 sec. Extremities: No cyanosis, clubbing, edema Neurologic: alert & oriented X3, cranial nerves II-XII intact, strength normal in all extremities, sensation intact to light touch, and gait normal.  Skin: turgor normal and no rashes.  Psych: Oriented X3, memory intact for recent and remote, normally interactive, good eye contact, not anxious appearing, and not  depressed appearing.    Assessment & Plan:

## 2013-04-06 NOTE — Assessment & Plan Note (Addendum)
Patient reports intermittent chest pain and palpitation associated with current synthroid dose.  She is chest pain free if she does not take synthroid. Of note, she has had negative cardiac work up including a negative Myoview in the past ( 2012 and 2013). She did not have any problem when she was on synthroid 75 mcg in the past.  - I reviewed her TFT, she has a normal free T4 when she was on 75 mcg daily. She has always had mild elevated TSH on 75 and 88 mcg daily.  - will decrease her synthroid dosage back to 75 mcg daily. - repeat TFT in 6 weeks.

## 2013-04-06 NOTE — Progress Notes (Signed)
Case discussed with Dr. Li (at time of visit, soon after the resident saw the patient).  We reviewed the resident's history and exam and pertinent patient test results.  I agree with the assessment, diagnosis, and plan of care documented in the resident's note.  

## 2013-04-06 NOTE — Addendum Note (Signed)
Addended byDierdre Searles, Allysen Lazo on: 04/06/2013 05:51 PM   Modules accepted: Orders

## 2013-04-06 NOTE — Patient Instructions (Addendum)
1. Will check BMP, Mg, ABI. 2. Will decrease synthroid to 75 daily 3. Follow up in 3 months.

## 2013-04-21 ENCOUNTER — Ambulatory Visit (HOSPITAL_COMMUNITY)
Admission: RE | Admit: 2013-04-21 | Discharge: 2013-04-21 | Disposition: A | Discharge status: Home / Self Care | Payer: No Typology Code available for payment source | Source: Ambulatory Visit | Attending: Internal Medicine | Admitting: Internal Medicine

## 2013-04-21 DIAGNOSIS — M79609 Pain in unspecified limb: Secondary | ICD-10-CM | POA: Insufficient documentation

## 2013-04-21 DIAGNOSIS — R809 Proteinuria, unspecified: Secondary | ICD-10-CM

## 2013-04-21 DIAGNOSIS — M79662 Pain in left lower leg: Secondary | ICD-10-CM

## 2013-04-21 NOTE — Progress Notes (Signed)
VASCULAR LAB PRELIMINARY  ARTERIAL  ABI completed:    RIGHT    LEFT    PRESSURE WAVEFORM  PRESSURE WAVEFORM  BRACHIAL 146 Triphasic BRACHIAL 149 Triphasic  DP 162 Biphasic DP 167 Biphasic  AT   AT    PT 170 Biphasic PT 185 Biphasic  PER   PER    GREAT TOE  NA GREAT TOE  NA    RIGHT LEFT  ABI 1.14 1.24   Bilateral ABIs are within normal limits.  04/21/2013 12:44 PM Gertie Fey, RVT, RDCS, RDMS

## 2013-04-27 ENCOUNTER — Telehealth: Payer: Self-pay | Admitting: *Deleted

## 2013-04-27 NOTE — Telephone Encounter (Signed)
I called patient and spoke with her.  She reports substernal chest pain that occurs whenever she takes the Synthroid and he usually resolves within an hour.  Her last episode of chest pain was this morning following the Synthroid; it resolved and has not recurred, and she is pain-free at this time.  She reports a past history of chest pain and had a negative nuclear stress study in August of 2012.  She denies any known history of heart disease.  I advised her to hold Synthroid and advised that she be seen tomorrow in our clinic, and she agreed with the plan.  I also I advised that if she has any recurrent chest pain overnight that she should be evaluated immediately in the emergency department.  I spoke with the triage nurse, who will schedule an appointment in clinic tomorrow and will contact patient.

## 2013-04-27 NOTE — Telephone Encounter (Signed)
Will see at 2:30 tomorrow.

## 2013-04-27 NOTE — Telephone Encounter (Signed)
Call from pt stating she was seen in clinic 6/26 and dose of synthroid was changed from 88 mcg to 75 mcg because of chest pain. She started synthroid 75 mcg on 6/26 and is still having chest pain each time she takes it. The pain last 30 - 60 minutes and last about 2 hours after taking the pill.   She takes at the same time each day.  Rates pain 8/10, it is constant. She has skipped a few doses and she does not have any chest pain on those days.    Please advise. Dr Dierdre Searles on away rotation.  Pt # [id number]

## 2013-04-28 ENCOUNTER — Other Ambulatory Visit: Payer: Self-pay

## 2013-04-28 ENCOUNTER — Encounter: Payer: Self-pay | Admitting: Internal Medicine

## 2013-04-28 ENCOUNTER — Encounter: Payer: No Typology Code available for payment source | Admitting: Internal Medicine

## 2013-05-04 ENCOUNTER — Telehealth: Payer: Self-pay | Admitting: *Deleted

## 2013-05-04 NOTE — Telephone Encounter (Signed)
Pt called stating she read results of Renal Ultrasound in My Chart.  The results have scared her and she states she was not informed of results. She would like a call explaining these results.  Pt # [id number]  US done 7/11 Pt missed scheduled appointment 7/18

## 2013-05-04 NOTE — Telephone Encounter (Signed)
Called pt and discussed. Has appt in Aug with renal. Pt Ok with aug appt with renal and satisfied with info I gave.

## 2013-05-10 ENCOUNTER — Encounter (HOSPITAL_COMMUNITY): Payer: Self-pay | Admitting: Emergency Medicine

## 2013-05-10 ENCOUNTER — Emergency Department (HOSPITAL_COMMUNITY)
Admission: EM | Admit: 2013-05-10 | Discharge: 2013-05-10 | Disposition: A | Discharge status: Home / Self Care | Payer: No Typology Code available for payment source | Attending: Emergency Medicine | Admitting: Emergency Medicine

## 2013-05-10 DIAGNOSIS — E039 Hypothyroidism, unspecified: Secondary | ICD-10-CM | POA: Insufficient documentation

## 2013-05-10 DIAGNOSIS — Z7982 Long term (current) use of aspirin: Secondary | ICD-10-CM | POA: Insufficient documentation

## 2013-05-10 DIAGNOSIS — Z862 Personal history of diseases of the blood and blood-forming organs and certain disorders involving the immune mechanism: Secondary | ICD-10-CM | POA: Insufficient documentation

## 2013-05-10 DIAGNOSIS — Z8719 Personal history of other diseases of the digestive system: Secondary | ICD-10-CM | POA: Insufficient documentation

## 2013-05-10 DIAGNOSIS — F410 Panic disorder [episodic paroxysmal anxiety] without agoraphobia: Secondary | ICD-10-CM | POA: Insufficient documentation

## 2013-05-10 DIAGNOSIS — E669 Obesity, unspecified: Secondary | ICD-10-CM | POA: Insufficient documentation

## 2013-05-10 DIAGNOSIS — Z8679 Personal history of other diseases of the circulatory system: Secondary | ICD-10-CM | POA: Insufficient documentation

## 2013-05-10 DIAGNOSIS — Z79899 Other long term (current) drug therapy: Secondary | ICD-10-CM | POA: Insufficient documentation

## 2013-05-10 DIAGNOSIS — Z8639 Personal history of other endocrine, nutritional and metabolic disease: Secondary | ICD-10-CM | POA: Insufficient documentation

## 2013-05-10 DIAGNOSIS — R202 Paresthesia of skin: Secondary | ICD-10-CM

## 2013-05-10 DIAGNOSIS — R209 Unspecified disturbances of skin sensation: Secondary | ICD-10-CM | POA: Insufficient documentation

## 2013-05-10 LAB — COMPREHENSIVE METABOLIC PANEL
ALT: 20 U/L (ref 0–35)
Albumin: 3.9 g/dL (ref 3.5–5.2)
BUN: 12 mg/dL (ref 6–23)
Calcium: 9.2 mg/dL (ref 8.4–10.5)
GFR calc Af Amer: 77 mL/min — ABNORMAL LOW (ref >90)
Glucose, Bld: 93 mg/dL (ref 70–99)
Sodium: 138 mEq/L (ref 135–145)
Total Protein: 7.6 g/dL (ref 6.0–8.3)

## 2013-05-10 LAB — CBC WITH DIFFERENTIAL/PLATELET
Basophils Relative: 0 % (ref 0–1)
Eosinophils Absolute: 0.1 10*3/uL (ref 0.0–0.7)
Eosinophils Relative: 2 % (ref 0–5)
Lymphs Abs: 1.5 10*3/uL (ref 0.7–4.0)
MCH: 30.9 pg (ref 26.0–34.0)
MCHC: 34.7 g/dL (ref 30.0–36.0)
MCV: 89 fL (ref 78.0–100.0)
Platelets: 220 10*3/uL (ref 150–400)
RBC: 4.63 MIL/uL (ref 3.87–5.11)
RDW: 13.8 % (ref 11.5–15.5)

## 2013-05-10 LAB — URINALYSIS, ROUTINE W REFLEX MICROSCOPIC
Bilirubin Urine: NEGATIVE
Hgb urine dipstick: NEGATIVE
Ketones, ur: NEGATIVE mg/dL
Nitrite: NEGATIVE
pH: 6 (ref 5.0–8.0)

## 2013-05-10 LAB — URINE MICROSCOPIC-ADD ON

## 2013-05-10 NOTE — ED Provider Notes (Signed)
CSN: [id number]     Arrival date & time 05/10/13  1341 History     First MD Initiated Contact with Patient 05/10/13 1605     Chief Complaint  Patient presents with  . Fatigue   (Consider location/radiation/quality/duration/timing/severity/associated sxs/prior Treatment) HPI Pt states that since she woke this am sh has had bl LE "heaviness" and "feeling like they will give out." No trauma, back pain, leg swelling, numbness. No urinary changes. Pt does complain of mild bl quadricept pain. No fever, chills. No N/V/D. Pt states she has been taking her Synthroid and is scheduled to f/u with nephrologist for protein in urine and abnormal renal US.  Past Medical History  Diagnosis Date  . Hypertriglyceridemia     164 - 2/11  . Panic disorder 11/05  . Obesity     250 lbs 8/11  . Atypical chest pain     Negative cardiolite at [facility] cards 11/07  . Anemia, iron deficiency     Requried 2u prbc 6/09, menometrorrhagia, uterine fibroids.  . Hypothyroidism   . Insomnia   . Allergic rhinitis   . Elevated BP   . Anginal pain   . Shortness of breath 05/18/2012    "at rest; lying down; w/exertion"  . Perirectal abscess 11/14/2011  . CHOLECYSTECTOMY, HX OF 08/31/2006    Annotation: 2000 Qualifier: Diagnosis of  By: Renae Fickle MD, Bhakti     Past Surgical History  Procedure Laterality Date  . Incision and drainage perirectal abscess  11/14/2011    Procedure: IRRIGATION AND DEBRIDEMENT PERIRECTAL ABSCESS;  Surgeon: Liz Malady, MD;  Location[facility] OR;  Service: General;  Laterality: N/A;  . Cholecystectomy  07/30/1999  . Abdominal hysterectomy  04/2007    total abdominal hysterectomy ( cervix) with bilateral salpingo-oopherectomy  . Dilation and curettage of uterus  1987  . Incision and drainage abscess  11/02/2012    Procedure: INCISION AND DRAINAGE ABSCESS;  Surgeon: Adolph Pollack, MD;  Location: [facility] OR;  Service: General;  Laterality: N/A;  I&D anorectal abscess   Family History  Problem Relation  Age of Onset  . Heart attack Mother     at the age of 21's  . Lung cancer Mother     metestatic, + smoker  . Aneurysm Mother   . Hyperlipidemia Mother   . Hypertension Mother   . Hypertension Father   . Heart attack Maternal Grandmother     at the age of 56's   History  Substance Use Topics  . Smoking status: Never Smoker   . Smokeless tobacco: Never Used  . Alcohol Use: Yes     Comment: 05/18/12 "have a taste of a drink maybe once/yr"   OB History   Grav Para Term Preterm Abortions TAB SAB Ect Mult Living                 Review of Systems  Constitutional: Positive for fatigue. Negative for fever and chills.  HENT: Negative for neck pain and neck stiffness.   Respiratory: Negative for chest tightness and shortness of breath.   Cardiovascular: Negative for chest pain, palpitations and leg swelling.  Gastrointestinal: Negative for nausea, vomiting, abdominal pain, diarrhea and constipation.  Genitourinary: Negative for dysuria, frequency and hematuria.  Musculoskeletal: Positive for myalgias. Negative for back pain and arthralgias.  Skin: Negative for rash.  Neurological: Positive for weakness (bl lower extremities). Negative for dizziness, light-headedness, numbness and headaches.  All other systems reviewed and are negative.    Allergies  Codeine;  Doxycycline; Hydrocodone; Oxycodone; and Sulfamethoxazole w-trimethoprim  Home Medications   Current Outpatient Rx  Name  Route  Sig  Dispense  Refill  . aspirin 81 MG tablet   Oral   Take 81 mg by mouth daily.         Marland Kitchen ibuprofen (MOTRIN IB) 200 MG tablet   Oral   Take 2 tablets (400 mg total) by mouth every 8 (eight) hours as needed for pain.   100 tablet   2   . levothyroxine (SYNTHROID) 75 MCG tablet   Oral   Take 1 tablet (75 mcg total) by mouth daily.   30 tablet   11   . PARoxetine (PAXIL) 20 MG tablet   Oral   Take 1 tablet (20 mg total) by mouth daily.   90 tablet   4    BP 166/97  Pulse 79   Temp(Src) 98.5 F (36.9 C) (Oral)  Resp 18  SpO2 98%  LMP 06/17/2007 Physical Exam  Nursing note and vitals reviewed. Constitutional: She is oriented to person, place, and time. She appears well-developed and well-nourished. No distress.  HENT:  Head: Normocephalic and atraumatic.  Mouth/Throat: Oropharynx is clear and moist.  Eyes: EOM are normal. Pupils are equal, round, and reactive to light.  Neck: Normal range of motion. Neck supple.  Cardiovascular: Normal rate and regular rhythm.  Exam reveals no gallop and no friction rub.   No murmur heard. Pulmonary/Chest: Effort normal and breath sounds normal. No respiratory distress. She has no wheezes. She has no rales. She exhibits no tenderness.  Abdominal: Soft. Bowel sounds are normal. She exhibits no distension and no mass. There is no tenderness. There is no rebound and no guarding.  Musculoskeletal: Normal range of motion. She exhibits tenderness (mild lumbar paraspinal tenderness. No midline tenderness. negative straight leg raise. No calf swelling or tenderness. 2+bl DP). She exhibits no edema.  Neurological: She is alert and oriented to person, place, and time.  5/5 motor in all ext. Pt states she has pain with fully flexing bl hips but no definite weakness. Sensation is intact.   Skin: Skin is warm and dry. No rash noted. No erythema.  Psychiatric: She has a normal mood and affect. Her behavior is normal.    ED Course   Procedures (including critical care time)  Labs Reviewed  COMPREHENSIVE METABOLIC PANEL - Abnormal; Notable for the following:    GFR calc non Af Amer 67 (*)    GFR calc Af Amer 77 (*)    All other components within normal limits  URINALYSIS, ROUTINE W REFLEX MICROSCOPIC - Abnormal; Notable for the following:    Protein, ur 100 (*)    All other components within normal limits  CBC WITH DIFFERENTIAL  CK  URINE MICROSCOPIC-ADD ON   No results found. 1. Paresthesias     MDM  No focal neurological  deficits.   Husband at bedside. He is more clear historian. States pt woke with pins and needles sensation in bl lower legs upon waking this morning which have now completely resolved. Pt remain neurologically stable. Negative workup. Suspect symptoms related to nerve compression from way pt was sleeping. Do not suspect TIA or CVA. Pt to f/u with PMD. Return precautions given.   Loren Racer, MD 05/10/13 Barry Brunner

## 2013-05-10 NOTE — ED Notes (Signed)
Pt sts fatigue and bilateral weakness in legs upon waking today

## 2013-05-16 ENCOUNTER — Other Ambulatory Visit (INDEPENDENT_AMBULATORY_CARE_PROVIDER_SITE_OTHER): Payer: No Typology Code available for payment source

## 2013-05-16 DIAGNOSIS — E039 Hypothyroidism, unspecified: Secondary | ICD-10-CM

## 2013-05-17 LAB — TSH: TSH: 2.583 u[IU]/mL (ref 0.350–4.500)

## 2013-05-18 ENCOUNTER — Encounter: Payer: Self-pay | Admitting: Internal Medicine

## 2013-05-18 ENCOUNTER — Observation Stay (HOSPITAL_COMMUNITY)
Admission: AD | Admit: 2013-05-18 | Discharge: 2013-05-19 | Disposition: A | Discharge status: Home / Self Care | Payer: No Typology Code available for payment source | Source: Ambulatory Visit | Attending: Internal Medicine | Admitting: Internal Medicine

## 2013-05-18 ENCOUNTER — Ambulatory Visit (INDEPENDENT_AMBULATORY_CARE_PROVIDER_SITE_OTHER): Payer: No Typology Code available for payment source | Admitting: Internal Medicine

## 2013-05-18 ENCOUNTER — Ambulatory Visit (HOSPITAL_COMMUNITY)
Admission: RE | Admit: 2013-05-18 | Discharge: 2013-05-18 | Disposition: A | Discharge status: Home / Self Care | Payer: No Typology Code available for payment source | Source: Ambulatory Visit | Attending: Internal Medicine | Admitting: Internal Medicine

## 2013-05-18 ENCOUNTER — Encounter (HOSPITAL_COMMUNITY): Payer: Self-pay | Admitting: General Practice

## 2013-05-18 VITALS — BP 140/90 | HR 74 | Temp 98.0°F | Ht 61.0 in | Wt 265.5 lb

## 2013-05-18 DIAGNOSIS — R079 Chest pain, unspecified: Principal | ICD-10-CM | POA: Insufficient documentation

## 2013-05-18 DIAGNOSIS — E785 Hyperlipidemia, unspecified: Secondary | ICD-10-CM

## 2013-05-18 DIAGNOSIS — E781 Pure hyperglyceridemia: Secondary | ICD-10-CM | POA: Insufficient documentation

## 2013-05-18 DIAGNOSIS — F4323 Adjustment disorder with mixed anxiety and depressed mood: Secondary | ICD-10-CM | POA: Diagnosis present

## 2013-05-18 DIAGNOSIS — J301 Allergic rhinitis due to pollen: Secondary | ICD-10-CM

## 2013-05-18 DIAGNOSIS — R030 Elevated blood-pressure reading, without diagnosis of hypertension: Secondary | ICD-10-CM | POA: Insufficient documentation

## 2013-05-18 DIAGNOSIS — E039 Hypothyroidism, unspecified: Secondary | ICD-10-CM | POA: Insufficient documentation

## 2013-05-18 DIAGNOSIS — F410 Panic disorder [episodic paroxysmal anxiety] without agoraphobia: Secondary | ICD-10-CM | POA: Insufficient documentation

## 2013-05-18 DIAGNOSIS — R0789 Other chest pain: Secondary | ICD-10-CM | POA: Diagnosis present

## 2013-05-18 HISTORY — DX: Personal history of other medical treatment: Z92.89

## 2013-05-18 LAB — BASIC METABOLIC PANEL
BUN: 14 mg/dL (ref 6–23)
CO2: 26 mEq/L (ref 19–32)
Chloride: 104 mEq/L (ref 96–112)
Glucose, Bld: 89 mg/dL (ref 70–99)
Potassium: 3.7 mEq/L (ref 3.5–5.1)
Sodium: 141 mEq/L (ref 135–145)

## 2013-05-18 LAB — TROPONIN I: Troponin I: <0.3 ng/mL (ref <0.30)

## 2013-05-18 LAB — CBC
HCT: 38.4 % (ref 36.0–46.0)
Hemoglobin: 13.4 g/dL (ref 12.0–15.0)
MCH: 30.8 pg (ref 26.0–34.0)
MCHC: 34.9 g/dL (ref 30.0–36.0)
RBC: 4.35 MIL/uL (ref 3.87–5.11)

## 2013-05-18 MED ORDER — ASPIRIN 81 MG PO CHEW
81.0000 mg | CHEWABLE_TABLET | Freq: Every day | ORAL | Status: DC
  Administered 2013-05-19: 81 mg via ORAL
  Filled 2013-05-18: qty 1

## 2013-05-18 MED ORDER — SODIUM CHLORIDE 0.9 % IJ SOLN: 3.0000 mL | Freq: Two times a day (BID) | INTRAMUSCULAR | Status: DC

## 2013-05-18 MED ORDER — MORPHINE SULFATE 2 MG/ML IJ SOLN: 1.0000 mg | INTRAMUSCULAR | Status: DC | PRN

## 2013-05-18 MED ORDER — SODIUM CHLORIDE 0.9 % IV SOLN: 250.0000 mL | INTRAVENOUS | Status: DC | PRN

## 2013-05-18 MED ORDER — NITROGLYCERIN 0.4 MG SL SUBL: 0.4000 mg | SUBLINGUAL_TABLET | SUBLINGUAL | Status: DC | PRN

## 2013-05-18 MED ORDER — LEVOTHYROXINE SODIUM 75 MCG PO TABS
75.0000 ug | ORAL_TABLET | Freq: Every day | ORAL | Status: DC
  Administered 2013-05-19: 75 ug via ORAL
  Filled 2013-05-18: qty 1

## 2013-05-18 MED ORDER — PAROXETINE HCL 20 MG PO TABS
20.0000 mg | ORAL_TABLET | Freq: Every day | ORAL | Status: DC
  Administered 2013-05-19: 20 mg via ORAL
  Filled 2013-05-18: qty 1

## 2013-05-18 MED ORDER — HEPARIN SODIUM (PORCINE) 5000 UNIT/ML IJ SOLN
5000.0000 [IU] | Freq: Three times a day (TID) | INTRAMUSCULAR | Status: DC
  Administered 2013-05-18 – 2013-05-19 (×2): 5000 [IU] via SUBCUTANEOUS
  Filled 2013-05-18 (×5): qty 1

## 2013-05-18 MED ORDER — SODIUM CHLORIDE 0.9 % IJ SOLN: 3.0000 mL | INTRAMUSCULAR | Status: DC | PRN

## 2013-05-18 MED ORDER — IBUPROFEN 400 MG PO TABS
400.0000 mg | ORAL_TABLET | Freq: Three times a day (TID) | ORAL | Status: DC | PRN
  Filled 2013-05-18: qty 1

## 2013-05-18 NOTE — Progress Notes (Signed)
Subjective:   Patient ID: [name] female   DOB: 06-29-1961 52 y.o.   MRN: [id number]  Chief complains: calf pain and follow up on Thyroid function  HPI: [name] is a 51 y.o. women with a PMH of atypical chest pain with Negative Cardiolite at [place] cards 11/07 and 08/13, Obesity, HTN, HLD, hypothyroidism, panic disorder, presents to the outpatient clinic for follow up visit.  # Chest pain She reports episodic left sided chest pain/pressure 5-9/10, dull/pressure/ "squeeze pain" x 1 day, radiation to left shoulder, lasting for 5-20 minutes, accompanied with sweating at times. Patient reports activity can aggravate her chest pain, but sometimes she has chest pain while resting. Resting alleviates pain. She took one Nitroglycerin which relived her chest pain. She noticed elevated BP ranges at 165/95. Her last chest pain just happened when she walked from the parting lot to the clinic and she is currently having chest pain.  No sick contact or long distance travel. Denies heart burn. States that her stress level was controlled recently, and she is not stressful now.   She also reports feeling fatigue and tiredness for past two weeks. Denies SOB, orthopnea or PND. She states that she could not sleep well at nights. She does not know the reason. She reports that she snores at nighttime. She reports that she sleeps more during the day.    Patient was hospitalized in August, 2013 for similar problem of DOE and chest pain.  She has had negative cardiac workups including cardiac enzymes, EKG, echo and myoview.  Her D-Dimer was negative as well.  She was noted to have elevated TSH level and was started on Synthroid 50 Mcg.    # Hypothyroidism  she is on LT4 75 mcg daily and reports medical compliant. Her repeat TFT was normalized.    Health maintenance -Patient had TAH ( Cervix removed) and BSO in 2009 and last Pap smear was normal in 2009. Marland Kitchen    Past Medical History  Diagnosis  Date  . Hypertriglyceridemia     164 - 2/11  . Panic disorder 11/05  . Obesity     250 lbs 8/11  . Atypical chest pain     Negative cardiolite at [facility] cards 11/07  . Anemia, iron deficiency     Requried 2u prbc 6/09, menometrorrhagia, uterine fibroids.  . Hypothyroidism   . Insomnia   . Allergic rhinitis   . Elevated BP   . Anginal pain   . Shortness of breath 05/18/2012    "at rest; lying down; w/exertion"  . Perirectal abscess 11/14/2011  . CHOLECYSTECTOMY, HX OF 08/31/2006    Annotation: 2000 Qualifier: Diagnosis of  By: Renae Fickle MD, Bhakti     Current Outpatient Prescriptions  Medication Sig Dispense Refill  . aspirin 81 MG tablet Take 81 mg by mouth daily.      Marland Kitchen ibuprofen (MOTRIN IB) 200 MG tablet Take 2 tablets (400 mg total) by mouth every 8 (eight) hours as needed for pain.  100 tablet  2  . levothyroxine (SYNTHROID) 75 MCG tablet Take 1 tablet (75 mcg total) by mouth daily.  30 tablet  11  . PARoxetine (PAXIL) 20 MG tablet Take 1 tablet (20 mg total) by mouth daily.  90 tablet  4   No current facility-administered medications for this visit.   Family History  Problem Relation Age of Onset  . Heart attack Mother     at the age of 65's  . Lung cancer Mother  metestatic, + smoker  . Aneurysm Mother   . Hyperlipidemia Mother   . Hypertension Mother   . Hypertension Father   . Heart attack Maternal Grandmother     at the age of 64's   History   Social History  . Marital Status: Married    Spouse Name: N/A    Number of Children: N/A  . Years of Education: N/A   Social History Main Topics  . Smoking status: Never Smoker   . Smokeless tobacco: Never Used  . Alcohol Use: Yes     Comment: 05/18/12 "have a taste of a drink maybe once/yr"  . Drug Use: No  . Sexually Active: Not Currently   Other Topics Concern  . None   Social History Narrative   Currently unemployed. Starting college in Aug for accounting.  Married, has 2 children lives with her youngest son.      Review of Systems: Review of Systems:  Constitutional:  Denies fever, chills, diaphoresis, appetite change and positive fatigue.   HEENT:  Denies congestion, sore throat, rhinorrhea, sneezing, mouth sores, trouble swallowing, neck pain   Respiratory:  Denies SOB, DOE, cough, and wheezing.   Cardiovascular:  Denies palpitations and leg swelling. Positive chest pain  Gastrointestinal:  Denies nausea, vomiting, abdominal pain, diarrhea, constipation, blood in stool and abdominal distention.   Genitourinary:  Denies dysuria, urgency, frequency, hematuria, flank pain and difficulty urinating.   Musculoskeletal:  Denies myalgias, back pain.   Skin:  Denies pallor, rash and wound.   Neurological:  Denies dizziness, seizures, syncope, weakness, light-headedness, numbness and headaches.    .   Objective:  Physical Exam: Filed Vitals:   05/18/13 1356 05/18/13 1458 05/18/13 1500 05/18/13 1501  BP: 160/90 140/80 160/90 140/90  Pulse: 80 74 68 74  Temp: 98 F (36.7 C)     TempSrc: Oral     Height: 5\' 1"  (1.549 m)     Weight: 265 lb 8 oz (120.43 kg)     SpO2: 100%      General: alert, well-developed, and cooperative to examination.  Head: normocephalic and atraumatic.  Eyes: vision grossly intact, pupils equal, pupils round, pupils reactive to light, no injection and anicteric.  Mouth: pharynx pink and moist, no erythema, and no exudates.  Neck: supple, full ROM, no thyromegaly, no JVD, and no carotid bruits.  Lungs: normal respiratory effort, no accessory muscle use, normal breath sounds, no crackles, and no wheezes. Heart: normal rate, regular rhythm, no murmur, no gallop, and no rub.  Abdomen: soft, non-tender, normal bowel sounds, no distention, no guarding, no rebound tenderness, no hepatomegaly, and no splenomegaly.  Msk: no joint swelling, no joint warmth, and no redness over joints. Horman negative Pulses: 1+ DP/PT pulses bilaterally. Cap refill < 2 sec. Extremities: No cyanosis,  clubbing, edema Neurologic: alert & oriented X3, cranial nerves II-XII intact, strength normal in all extremities, sensation intact to light touch, and gait normal.  Skin: turgor normal and no rashes.  Psych: Oriented X3, memory intact for recent and remote, normally interactive, good eye contact, not anxious appearing, and not depressed appearing.    Assessment & Plan:

## 2013-05-18 NOTE — Assessment & Plan Note (Addendum)
1. Chest pain rule out.  The patient is a 52 year old woman who presented with intermittent chest pain accompanied with sweating x 1 day. Her assessment was unremarkable.  Her EKG was unremarkable for any ischemia event.  The differential diagnosis include PE/PNA/aortic dissection/pericarditis/musculoskeletal  pain/GERD/anxiety.  For PE>>> no tachycardia or tachypnea noted at the clinic, denies long-distance travel, does not take oral contraceptives (h/o TAH), no s/s of DVT, Geneva score 0 = low probability. Of noted. Negative CT angio noted in 2010.  For pneumonia>>> no cough or fever, lung examination unremarkable, no leukocytosis. Normal chest x-ray . PNA less likely.  For aortic dissection>>> the clinical manifestations is not consistent with aortic dissection. Her chest pain been x 1 day.. She is hemodynamically stable. Even if she reports elevated BP with her chest pain episodes at times. The highest BP was 160/90. Her repeat BP is 140/90's at the clinic today. The aortic dissection is less likely.  For pericarditis>>> no precipitating factors. Her chest pain is not positional. No Rub noted on exam. EKG is unremarkable.   Plan - case discussed with Dr. Meredith Pel. Will admit her to hospital for CP work up.  - on call resident Dr. Clyde Lundborg called for admission and Tele OB requested.

## 2013-05-18 NOTE — Patient Instructions (Addendum)
Admission
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
## 2013-05-18 NOTE — Progress Notes (Signed)
Report called to Sarah on 3W.

## 2013-05-19 ENCOUNTER — Other Ambulatory Visit: Payer: Self-pay

## 2013-05-19 MED ORDER — HYDROCHLOROTHIAZIDE 12.5 MG PO CAPS: 12.5000 mg | ORAL_CAPSULE | Freq: Every day | ORAL | Status: DC

## 2013-05-19 NOTE — Progress Notes (Signed)
I saw and evaluated the patient.  I personally confirmed the key portions of the history and exam documented by Dr. Li and I reviewed pertinent patient test results.  The assessment, diagnosis, and plan were formulated together and I agree with the documentation in the resident's note. 

## 2013-05-19 NOTE — Progress Notes (Signed)
Utilization review completed.  

## 2013-05-19 NOTE — Progress Notes (Signed)
Subjective: [name] is a 52 y.o. female w/ PMHx of Panic Disorder, hypertriglyceridemia, and hypothyroidism, presents from clinic w/ complaints of chest pain.  Patient seen at bedside today. She has no complaints. She denies any chest pain, SOB, diaphoresis, nausea, vomiting, lightheadedness, dizziness, or palpitations.  Troponins were cycled x3 and all were -ve. EKG was repeated this AM and it showed NSR w/ twi in lead III w/ nonspecific T wave changes in lateral leads, unchanged from her EKG yesterday.  Patient is ready for discharge.  Objective: Vital signs in last 24 hours: Filed Vitals:   05/18/13 1743 05/18/13 2100 05/19/13 0457  BP: 170/96 154/89 154/84  Pulse: 64 80 61  Temp: 98.1 F (36.7 C) 97.6 F (36.4 C) 98.1 F (36.7 C)  TempSrc: Oral Oral Oral  Resp: 18 20 18   Height: 5\' 1"  (1.549 m)    Weight: 266 lb 12.8 oz (121.02 kg)    SpO2: 94% 97% 96%   Weight change:   Intake/Output Summary (Last 24 hours) at 05/19/13 1542 Last data filed at 05/19/13 0900  Gross per 24 hour  Intake    600 ml  Output      0 ml  Net    600 ml   Physical Exam: General: Alert, cooperative, and in no apparent distress HEENT: Vision grossly intact, oropharynx clear and non-erythematous  Neck: Full range of motion without pain, supple, no lymphadenopathy or carotid bruits Lungs: Clear to ascultation bilaterally, normal work of respiration, no wheezes, rales, ronchi Heart: Regular rate and rhythm, no murmurs, gallops, or rubs Abdomen: Soft, non-tender, non-distended, normal bowel sounds Extremities: No cyanosis, clubbing, or edema Neurologic: Alert & oriented X3, cranial nerves II-XII intact, strength grossly intact, sensation intact to light touch  Lab Results: Basic Metabolic Panel:  Recent Labs Lab 05/18/13 1817  NA 141  K 3.7  CL 104  CO2 26  GLUCOSE 89  BUN 14  CREATININE 1.00  CALCIUM 9.1   Liver Function Tests: No results found for this basename: AST,  ALT, ALKPHOS, BILITOT, PROT, ALBUMIN,  in the last 168 hours No results found for this basename: LIPASE, AMYLASE,  in the last 168 hours No results found for this basename: AMMONIA,  in the last 168 hours CBC:  Recent Labs Lab 05/18/13 1817  WBC 8.6  HGB 13.4  HCT 38.4  MCV 88.3  PLT 232   Cardiac Enzymes:  Recent Labs Lab 05/18/13 1821 05/19/13 0048 05/19/13 0445  TROPONINI <0.30 <0.30 <0.30   BNP: No results found for this basename: PROBNP,  in the last 168 hours D-Dimer: No results found for this basename: DDIMER,  in the last 168 hours CBG: No results found for this basename: GLUCAP,  in the last 168 hours Hemoglobin A1C: No results found for this basename: HGBA1C,  in the last 168 hours Fasting Lipid Panel: No results found for this basename: CHOL, HDL, LDLCALC, TRIG, CHOLHDL, LDLDIRECT,  in the last 168 hours Thyroid Function Tests:  Recent Labs Lab 05/16/13 1050  TSH 2.583  FREET4 1.21   Coagulation: No results found for this basename: LABPROT, INR,  in the last 168 hours Anemia Panel: No results found for this basename: VITAMINB12, FOLATE, FERRITIN, TIBC, IRON, RETICCTPCT,  in the last 168 hours Urine Drug Screen: Drugs of Abuse  No results found for this basename: labopia, cocainscrnur, labbenz, amphetmu, thcu, labbarb    Alcohol Level: No results found for this basename: ETH,  in the last 168 hours Urinalysis:  No results found for this basename: COLORURINE, APPERANCEUR, LABSPEC, PHURINE, GLUCOSEU, HGBUR, BILIRUBINUR, KETONESUR, PROTEINUR, UROBILINOGEN, NITRITE, LEUKOCYTESUR,  in the last 168 hours  Micro Results: Recent Results (from the past 240 hour(s))  MRSA PCR SCREENING     Status: Abnormal   Collection Time    05/18/13  6:06 PM      Result Value Range Status   MRSA by PCR POSITIVE (*) NEGATIVE Final   Comment:            The GeneXpert MRSA Assay (FDA     approved for NASAL specimens     only), is one component of a     comprehensive  MRSA colonization     surveillance program. It is not     intended to diagnose MRSA     infection nor to guide or     monitor treatment for     MRSA infections.     RESULT CALLED TO, READ BACK BY AND VERIFIED WITH:      M.ZAFRA (RN) AT 2326 05/18/2013 L.LOMAX   Studies/Results: No results found. Medications: I have reviewed the patient's current medications. Scheduled Meds: . aspirin  81 mg Oral Daily  . heparin  5,000 Units Subcutaneous Q8H  . levothyroxine  75 mcg Oral Daily  . PARoxetine  20 mg Oral Daily  . sodium chloride  3 mL Intravenous Q12H   Continuous Infusions:  PRN Meds:.sodium chloride, ibuprofen, morphine injection, nitroGLYCERIN, sodium chloride  Assessment/Plan: 52 y.o. female w/ PMHx of Panic Disorder, hypothyroidism, and obesity, presents from clinic w/ complaints of chest pain. Most likely 2/2 panic attack. EKG repeated today and showed NSR w/ twi's in lead III, unchanged from previous EKG. Troponins -ve x3. Unlikely to be ACS. Consider Steffanie Dunn as an outpatient to r/o ischemia. -Continued ASA 81 mg -NTG prn and morphine 1 mg prn for chest pain -Neurovascular checks q4h  #Panic Disorder- Patient has had Panic Disorder for some time and has infrequent panic attacks associated with various life stressors. She feels it is generally well controlled. She sometimes has chest pain associated with panic attacks.  -Continue home dose of Paxil 20 mg qd. -Consider acute panic attack medication as an outpatient.  #Hypothyroidism- Patient reports being started on synthroid ~1 month ago. On 04/05/13, TSH was 7.78 and fT4 was 1.06. Repeat TSH, fT4 on 05/16/13 was 2.58 and 1.21 respectively. Patient currently denies any symptoms of hypo- or hyperthyroidism.  -Continued home dose of Synthroid 75 mcg qd.  Dispo: Disposition is deferred at this time, awaiting improvement of current medical problems.  Anticipated discharge today.  The patient does have a current PCP (Dede Query,  MD) and does need an [facility] hospital follow-up appointment after discharge.  The patient does not have transportation limitations that hinder transportation to clinic appointments.  .Services Needed at time of discharge: Y = Yes, Blank = No PT:   OT:   RN:   Equipment:   Other:     LOS: 1 day   Lars Masson, MD 05/19/2013, 3:42 PM
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
## 2013-05-24 ENCOUNTER — Encounter: Payer: Self-pay | Admitting: Internal Medicine

## 2013-05-26 ENCOUNTER — Ambulatory Visit: Payer: No Typology Code available for payment source | Admitting: Internal Medicine

## 2013-06-09 ENCOUNTER — Emergency Department (HOSPITAL_COMMUNITY)
Admission: EM | Admit: 2013-06-09 | Discharge: 2013-06-09 | Disposition: A | Discharge status: Home / Self Care | Payer: No Typology Code available for payment source | Attending: Emergency Medicine | Admitting: Emergency Medicine

## 2013-06-09 ENCOUNTER — Emergency Department (HOSPITAL_COMMUNITY): Payer: No Typology Code available for payment source

## 2013-06-09 ENCOUNTER — Encounter (HOSPITAL_COMMUNITY): Payer: Self-pay

## 2013-06-09 DIAGNOSIS — S6980XA Other specified injuries of unspecified wrist, hand and finger(s), initial encounter: Secondary | ICD-10-CM | POA: Insufficient documentation

## 2013-06-09 DIAGNOSIS — Z862 Personal history of diseases of the blood and blood-forming organs and certain disorders involving the immune mechanism: Secondary | ICD-10-CM | POA: Insufficient documentation

## 2013-06-09 DIAGNOSIS — Z8679 Personal history of other diseases of the circulatory system: Secondary | ICD-10-CM | POA: Insufficient documentation

## 2013-06-09 DIAGNOSIS — F410 Panic disorder [episodic paroxysmal anxiety] without agoraphobia: Secondary | ICD-10-CM | POA: Insufficient documentation

## 2013-06-09 DIAGNOSIS — S51809A Unspecified open wound of unspecified forearm, initial encounter: Secondary | ICD-10-CM | POA: Insufficient documentation

## 2013-06-09 DIAGNOSIS — Z9089 Acquired absence of other organs: Secondary | ICD-10-CM | POA: Insufficient documentation

## 2013-06-09 DIAGNOSIS — Y9389 Activity, other specified: Secondary | ICD-10-CM | POA: Insufficient documentation

## 2013-06-09 DIAGNOSIS — Z872 Personal history of diseases of the skin and subcutaneous tissue: Secondary | ICD-10-CM | POA: Insufficient documentation

## 2013-06-09 DIAGNOSIS — E669 Obesity, unspecified: Secondary | ICD-10-CM | POA: Insufficient documentation

## 2013-06-09 DIAGNOSIS — S51851A Open bite of right forearm, initial encounter: Secondary | ICD-10-CM

## 2013-06-09 DIAGNOSIS — Z8669 Personal history of other diseases of the nervous system and sense organs: Secondary | ICD-10-CM | POA: Insufficient documentation

## 2013-06-09 DIAGNOSIS — IMO0002 Reserved for concepts with insufficient information to code with codable children: Secondary | ICD-10-CM | POA: Insufficient documentation

## 2013-06-09 DIAGNOSIS — W540XXA Bitten by dog, initial encounter: Secondary | ICD-10-CM | POA: Insufficient documentation

## 2013-06-09 DIAGNOSIS — S6990XA Unspecified injury of unspecified wrist, hand and finger(s), initial encounter: Secondary | ICD-10-CM | POA: Insufficient documentation

## 2013-06-09 DIAGNOSIS — E039 Hypothyroidism, unspecified: Secondary | ICD-10-CM | POA: Insufficient documentation

## 2013-06-09 DIAGNOSIS — E781 Pure hyperglyceridemia: Secondary | ICD-10-CM | POA: Insufficient documentation

## 2013-06-09 DIAGNOSIS — Z79899 Other long term (current) drug therapy: Secondary | ICD-10-CM | POA: Insufficient documentation

## 2013-06-09 DIAGNOSIS — Z7982 Long term (current) use of aspirin: Secondary | ICD-10-CM | POA: Insufficient documentation

## 2013-06-09 DIAGNOSIS — R209 Unspecified disturbances of skin sensation: Secondary | ICD-10-CM | POA: Insufficient documentation

## 2013-06-09 DIAGNOSIS — Y9289 Other specified places as the place of occurrence of the external cause: Secondary | ICD-10-CM | POA: Insufficient documentation

## 2013-06-09 DIAGNOSIS — S6992XA Unspecified injury of left wrist, hand and finger(s), initial encounter: Secondary | ICD-10-CM

## 2013-06-09 DIAGNOSIS — S51852A Open bite of left forearm, initial encounter: Secondary | ICD-10-CM

## 2013-06-09 MED ORDER — TETANUS-DIPHTH-ACELL PERTUSSIS 5-2.5-18.5 LF-MCG/0.5 IM SUSP: 0.5000 mL | Freq: Once | INTRAMUSCULAR | Status: DC

## 2013-06-09 MED ORDER — AMOXICILLIN-POT CLAVULANATE 875-125 MG PO TABS: 1.0000 | ORAL_TABLET | Freq: Two times a day (BID) | ORAL | Status: DC

## 2013-06-09 MED ORDER — MORPHINE SULFATE 4 MG/ML IJ SOLN: 4.0000 mg | Freq: Once | INTRAMUSCULAR | Status: DC

## 2013-06-09 MED ORDER — HYDROMORPHONE HCL PF 1 MG/ML IJ SOLN
1.0000 mg | Freq: Once | INTRAMUSCULAR | Status: DC
  Filled 2013-06-09: qty 1

## 2013-06-09 MED ORDER — MORPHINE SULFATE 4 MG/ML IJ SOLN
4.0000 mg | Freq: Once | INTRAMUSCULAR | Status: AC
  Administered 2013-06-09: 4 mg via INTRAVENOUS
  Filled 2013-06-09: qty 1

## 2013-06-09 MED ORDER — LORAZEPAM 1 MG PO TABS
0.5000 mg | ORAL_TABLET | Freq: Once | ORAL | Status: AC
  Administered 2013-06-09: 0.5 mg via ORAL
  Filled 2013-06-09: qty 1

## 2013-06-09 MED ORDER — SODIUM CHLORIDE 0.9 % IV BOLUS (SEPSIS)
1000.0000 mL | Freq: Once | INTRAVENOUS | Status: AC
  Administered 2013-06-09: 1000 mL via INTRAVENOUS

## 2013-06-09 MED ORDER — ONDANSETRON HCL 4 MG/2ML IJ SOLN
4.0000 mg | Freq: Once | INTRAMUSCULAR | Status: AC
  Administered 2013-06-09: 4 mg via INTRAVENOUS
  Filled 2013-06-09: qty 2

## 2013-06-09 MED ORDER — TRAMADOL HCL 50 MG PO TABS
50.0000 mg | ORAL_TABLET | Freq: Once | ORAL | Status: AC
  Administered 2013-06-09: 50 mg via ORAL
  Filled 2013-06-09: qty 1

## 2013-06-09 MED ORDER — TRAMADOL HCL 50 MG PO TABS: 50.0000 mg | ORAL_TABLET | Freq: Four times a day (QID) | ORAL | Status: DC | PRN

## 2013-06-09 NOTE — Progress Notes (Signed)
Orthopedic Tech Progress Note Patient Details:  [name] 1961/03/27 [id number]  Ortho Devices Type of Ortho Device: Arm sling;Volar splint Ortho Device/Splint Location: LUE Ortho Device/Splint Interventions: Ordered;Application   [name] 06/09/2013, 4:27 PM

## 2013-06-09 NOTE — ED Notes (Signed)
Pt arrives [facility] from home. Pt was bit by her dog Careers adviser) just prior to arrival. Dog is not vaccinated. Pt with multiple dog bite lacerations to bilateral forearms. Pt c/o pain mostly in her left arm, unable to straighten her left ring finger. Pt with dog bite lacerations to left breast. Hemodynamically stable.

## 2013-06-09 NOTE — ED Provider Notes (Signed)
CSN: [id number]     Arrival date & time 06/09/13  1000 History   First MD Initiated Contact with Patient 06/09/13 1040     Chief Complaint  Patient presents with  . Animal Bite   (Consider location/radiation/quality/duration/timing/severity/associated sxs/prior Treatment) Patient is a 52 y.o. female presenting with animal bite. The history is provided by the patient. No language interpreter was used.  Animal Bite Contact animal:  Dog Location:  Shoulder/arm and torso Shoulder/arm injury location:  L forearm and R forearm Torso injury location:  L breast Pain details:    Quality:  Aching and sharp   Severity:  Severe   Timing:  Constant   Progression:  Unchanged Incident location:  Home Provoked: unprovoked   Notifications:  Animal control Animal's rabies vaccination status:  Never received Animal in possession: yes   Tetanus status:  Up to date Relieved by:  Nothing Worsened by:  Nothing tried Ineffective treatments:  None tried Associated symptoms: numbness (loaclized to wound on L forearm) and swelling   Associated symptoms: no fever   Associated symptoms comment:  Weakness of L ring finger   Past Medical History  Diagnosis Date  . Hypertriglyceridemia     164 - 2/11  . Panic disorder 11/05  . Obesity     250 lbs 8/11  . Atypical chest pain     Negative cardiolite at [facility] cards 11/07  . Hypothyroidism   . Insomnia   . Allergic rhinitis   . Elevated BP   . Anginal pain   . Shortness of breath 05/18/2012    "at rest; lying down; w/exertion"  . Perirectal abscess 11/14/2011  . CHOLECYSTECTOMY, HX OF     Annotation: 2000 Qualifier: Diagnosis of  By: Renae Fickle MD, Bhakti    . History of blood transfusion 03/2008    Requried 2u prbc; menometrorrhagia, uterine fibroids.  . Anemia, iron deficiency    Past Surgical History  Procedure Laterality Date  . Incision and drainage perirectal abscess  11/14/2011    Procedure: IRRIGATION AND DEBRIDEMENT PERIRECTAL ABSCESS;  Surgeon:  Liz Malady, MD;  Location: [facility] OR;  Service: General;  Laterality: N/A;  . Abdominal hysterectomy  04/2007    total abdominal hysterectomy ( cervix) with bilateral salpingo-oopherectomy  . Dilation and curettage of uterus  1987  . Incision and drainage abscess  11/02/2012    Procedure: INCISION AND DRAINAGE ABSCESS;  Surgeon: Adolph Pollack, MD;  Location: [facility] OR;  Service: General;  Laterality: N/A;  I&D anorectal abscess  . Cholecystectomy  07/30/1999    Diagnosis of  By: Renae Fickle MD, Bhakti     Family History  Problem Relation Age of Onset  . Heart attack Mother     at the age of 2's  . Lung cancer Mother     metestatic, + smoker  . Aneurysm Mother   . Hyperlipidemia Mother   . Hypertension Mother   . Hypertension Father   . Heart attack Maternal Grandmother     at the age of 46's   History  Substance Use Topics  . Smoking status: Never Smoker   . Smokeless tobacco: Never Used  . Alcohol Use: Yes     Comment: 05/18/2012 "have a taste of a drink maybe once/yr"   OB History   Grav Para Term Preterm Abortions TAB SAB Ect Mult Living                 Review of Systems  Constitutional: Negative for fever, chills, diaphoresis, activity  change, appetite change and fatigue.  HENT: Negative for congestion, sore throat, facial swelling, rhinorrhea, neck pain and neck stiffness.   Eyes: Negative for photophobia and discharge.  Respiratory: Negative for cough, chest tightness and shortness of breath.   Cardiovascular: Negative for chest pain, palpitations and leg swelling.  Gastrointestinal: Negative for nausea, vomiting, abdominal pain and diarrhea.  Endocrine: Negative for polydipsia and polyuria.  Genitourinary: Negative for dysuria, frequency, difficulty urinating and pelvic pain.  Musculoskeletal: Negative for back pain and arthralgias.  Skin: Positive for wound. Negative for color change.  Allergic/Immunologic: Negative for immunocompromised state.  Neurological: Positive  for numbness (loaclized to wound on L forearm). Negative for facial asymmetry, weakness and headaches.  Hematological: Does not bruise/bleed easily.  Psychiatric/Behavioral: Negative for confusion and agitation.    Allergies  Codeine; Doxycycline; Hydrocodone; Oxycodone; and Sulfamethoxazole w-trimethoprim  Home Medications   Current Outpatient Rx  Name  Route  Sig  Dispense  Refill  . aspirin 325 MG tablet   Oral   Take 325 mg by mouth daily.         . hydrochlorothiazide (MICROZIDE) 12.5 MG capsule   Oral   Take 1 capsule (12.5 mg total) by mouth daily.   30 capsule   1   . levothyroxine (SYNTHROID) 75 MCG tablet   Oral   Take 1 tablet (75 mcg total) by mouth daily.   30 tablet   11   . PARoxetine (PAXIL) 20 MG tablet   Oral   Take 1 tablet (20 mg total) by mouth daily.   90 tablet   4   . amoxicillin-clavulanate (AUGMENTIN) 875-125 MG per tablet   Oral   Take 1 tablet by mouth every 12 (twelve) hours.   20 tablet   0   . traMADol (ULTRAM) 50 MG tablet   Oral   Take 1 tablet (50 mg total) by mouth every 6 (six) hours as needed for pain.   20 tablet   0    BP 132/69  Pulse 100  Temp(Src) 97.9 F (36.6 C) (Oral)  Resp 19  Ht 5\' 1"  (1.549 m)  Wt 263 lb (119.296 kg)  BMI 49.72 kg/m2  SpO2 97%  LMP 06/17/2007 Physical Exam  Constitutional: She is oriented to person, place, and time. She appears well-developed and well-nourished. No distress.  HENT:  Head: Normocephalic and atraumatic.  Mouth/Throat: No oropharyngeal exudate.  Eyes: Pupils are equal, round, and reactive to light.  Neck: Normal range of motion. Neck supple.  Cardiovascular: Normal rate, regular rhythm and normal heart sounds.  Exam reveals no gallop and no friction rub.   No murmur heard. Pulmonary/Chest: Effort normal and breath sounds normal. No respiratory distress. She has no wheezes. She has no rales.  Abdominal: Soft. Bowel sounds are normal. She exhibits no distension and no  mass. There is no tenderness. There is no rebound and no guarding.  Musculoskeletal: Normal range of motion. She exhibits no edema and no tenderness.       Right shoulder: She exhibits laceration.       Arms:      Hands: Two open lacerations, approx 4x3cm,  to dorsal R forearm w/ multiple puncture wounds.  3 open wounds to R forearm approx 4x4, 3x2, 3x2 with multiple puncture wounds.  Several puncture wounds and superficial lacerations to L breast, not involving nipple.  Swelling over doral surface of distal forearm.  No wounds have exposed tendon.  Cannot fully extend L ring finger, although otherwise good pulse,  motor sensation of BL hands.   Neurological: She is alert and oriented to person, place, and time.  Skin: Skin is warm and dry.  Psychiatric: She has a normal mood and affect.    ED Course  Procedures (including critical care time) Labs Review Labs Reviewed - No data to display Imaging Review Dg Chest 2 View  06/09/2013   *RADIOLOGY REPORT*  Clinical Data: Dog bite, laceration to left breast, evaluate for foreign body  CHEST - 2 VIEW  Comparison: 11/02/2012  Findings: Lungs are clear. No pleural effusion or pneumothorax.  The heart is normal in size.  Mild degenerative changes of the visualized thoracolumbar spine.  No radiopaque foreign body is seen.  IMPRESSION: No evidence of acute cardiopulmonary disease.  No radiopaque foreign body is seen.   Original Report Authenticated By: Charline Bills, M.D.   Dg Forearm Left  06/09/2013   *RADIOLOGY REPORT*  Clinical Data: Dog bite, laceration to distal forearm  LEFT FOREARM - 2 VIEW  Comparison: None.  Findings: Soft tissue swelling/laceration overlying the mid forearm and the dorsum of the lower wrist.  No fracture or dislocation is seen.  The joint spaces are preserved.  No radiopaque foreign body is seen.  IMPRESSION: Soft tissue swelling/laceration overlying the mid forearm and lower wrist.  No fracture, dislocation, or radiopaque  foreign body is seen.   Original Report Authenticated By: Charline Bills, M.D.   Dg Forearm Right  06/09/2013   *RADIOLOGY REPORT*  Clinical Data: Dog bite, laceration to distal forearm  RIGHT FOREARM - 2 VIEW  Comparison: None.  Findings: Soft tissue swelling/laceration overlying the ventral aspect of the mid forearm.  No fracture or dislocation is seen.  The joint spaces are preserved.  No radiopaque foreign body is seen.  IMPRESSION: Soft tissue swelling/laceration overlying the ventral aspect of the mid forearm.  No fracture, dislocation, or radiopaque foreign body is seen.   Original Report Authenticated By: Charline Bills, M.D.   Dg Hand Complete Left  06/09/2013   *RADIOLOGY REPORT*  Clinical Data: Dog bite to left hand, decreased range of motion to fourth digit  LEFT HAND - COMPLETE 3+ VIEW  Comparison: None.  Findings: No fracture or dislocation is seen.  The joint spaces are preserved.  Visualized soft tissues are grossly unremarkable.  No radiopaque foreign body is seen.  IMPRESSION: No fracture, dislocation, or radiopaque foreign body is seen.   Original Report Authenticated By: Charline Bills, M.D.    Pt is a 52 y.o. female with Pmhx as above who presents with dog bites to BL forearms, L breast just PTA by pt's bull mastiff.  Dog has not had vaccines, but is in custody of animal control.  Tdap updated in last 5 years.  Pt has multiple lacerations/puncture wounds of BL dorsal and volar forearms and L breast. Good distal pulse, sensation distally, but has inability to completely extend L ring finger.  No pain over wrist or hand.  XRs ordered, show no bony abnormality or FB.  Hand surgery consulted due to deficit.    Have spoken to Dr. Mina Marble with hand surgery, he recommends starting Augmentin and clinic f/u on Tuesday.  If there is an acute tendon injury, he would not repair.  He states if lacs to be closed at all, should only be loosely closed.  Pt can then be placed in volar plaster  cast with L hand.  Wounds extensively irrigated and gaping wounds loosely approximated with steri strips.  Pt will return in 2  days for wound check, has been given strick return precautions for s/sx of infection.  Augmentin & tramdol have been given.  She will see Dr. Mina Marble in 4 days.  1. Dog bite of forearm without complication, right, initial encounter   2. Dog bite of forearm, left, initial encounter   3. Injury of left ring finger   4. Dog bite, initial encounter       Shanna Cisco, MD 06/09/13 1704

## 2013-06-11 ENCOUNTER — Encounter (HOSPITAL_COMMUNITY): Payer: Self-pay | Admitting: Emergency Medicine

## 2013-06-11 ENCOUNTER — Emergency Department (HOSPITAL_COMMUNITY): Payer: No Typology Code available for payment source | Admitting: Anesthesiology

## 2013-06-11 ENCOUNTER — Encounter (HOSPITAL_COMMUNITY): Payer: Self-pay | Admitting: Anesthesiology

## 2013-06-11 ENCOUNTER — Emergency Department (HOSPITAL_COMMUNITY)
Admission: EM | Admit: 2013-06-11 | Discharge: 2013-06-11 | Disposition: A | Discharge status: Home / Self Care | Payer: No Typology Code available for payment source | Attending: Emergency Medicine | Admitting: Emergency Medicine

## 2013-06-11 ENCOUNTER — Encounter (HOSPITAL_COMMUNITY)
Admission: EM | Disposition: A | Discharge status: Home / Self Care | Payer: Self-pay | Source: Home / Self Care | Attending: Emergency Medicine

## 2013-06-11 DIAGNOSIS — S51852A Open bite of left forearm, initial encounter: Secondary | ICD-10-CM

## 2013-06-11 DIAGNOSIS — S51809A Unspecified open wound of unspecified forearm, initial encounter: Secondary | ICD-10-CM | POA: Insufficient documentation

## 2013-06-11 DIAGNOSIS — W540XXD Bitten by dog, subsequent encounter: Secondary | ICD-10-CM

## 2013-06-11 DIAGNOSIS — W540XXA Bitten by dog, initial encounter: Secondary | ICD-10-CM | POA: Insufficient documentation

## 2013-06-11 DIAGNOSIS — R29898 Other symptoms and signs involving the musculoskeletal system: Secondary | ICD-10-CM | POA: Insufficient documentation

## 2013-06-11 HISTORY — PX: NERVE, TENDON AND ARTERY REPAIR: SHX5695

## 2013-06-11 LAB — GRAM STAIN

## 2013-06-11 SURGERY — NERVE, TENDON AND ARTERY REPAIR
Anesthesia: General | Site: Arm Lower | Laterality: Left | Wound class: Dirty or Infected

## 2013-06-11 MED ORDER — BUPIVACAINE HCL (PF) 0.25 % IJ SOLN
INTRAMUSCULAR | Status: DC | PRN
  Administered 2013-06-11: 10 mL

## 2013-06-11 MED ORDER — HYDROMORPHONE HCL PF 1 MG/ML IJ SOLN
0.2500 mg | INTRAMUSCULAR | Status: DC | PRN
  Administered 2013-06-11: 0.5 mg via INTRAVENOUS

## 2013-06-11 MED ORDER — MIDAZOLAM HCL 5 MG/5ML IJ SOLN
INTRAMUSCULAR | Status: DC | PRN
  Administered 2013-06-11: 2 mg via INTRAVENOUS

## 2013-06-11 MED ORDER — FENTANYL CITRATE 0.05 MG/ML IJ SOLN
INTRAMUSCULAR | Status: DC | PRN
  Administered 2013-06-11 (×5): 50 ug via INTRAVENOUS

## 2013-06-11 MED ORDER — LIDOCAINE HCL (CARDIAC) 20 MG/ML IV SOLN
INTRAVENOUS | Status: DC | PRN
  Administered 2013-06-11: 50 mg via INTRAVENOUS

## 2013-06-11 MED ORDER — SUCCINYLCHOLINE CHLORIDE 20 MG/ML IJ SOLN
INTRAMUSCULAR | Status: DC | PRN
  Administered 2013-06-11: 100 mg via INTRAVENOUS

## 2013-06-11 MED ORDER — HYDROMORPHONE HCL PF 1 MG/ML IJ SOLN
INTRAMUSCULAR | Status: AC
  Administered 2013-06-11: 0.5 mg via INTRAVENOUS
  Filled 2013-06-11: qty 1

## 2013-06-11 MED ORDER — ARTIFICIAL TEARS OP OINT
TOPICAL_OINTMENT | OPHTHALMIC | Status: DC | PRN
  Administered 2013-06-11: 1 via OPHTHALMIC

## 2013-06-11 MED ORDER — SODIUM CHLORIDE 0.9 % IR SOLN
Status: DC | PRN
  Administered 2013-06-11: 1000 mL

## 2013-06-11 MED ORDER — ONDANSETRON HCL 4 MG/2ML IJ SOLN
INTRAMUSCULAR | Status: DC | PRN
  Administered 2013-06-11: 4 mg via INTRAVENOUS

## 2013-06-11 MED ORDER — PROPOFOL 10 MG/ML IV BOLUS
INTRAVENOUS | Status: DC | PRN
  Administered 2013-06-11: 150 mg via INTRAVENOUS

## 2013-06-11 MED ORDER — LACTATED RINGERS IV SOLN
INTRAVENOUS | Status: DC | PRN
  Administered 2013-06-11: 14:00:00 via INTRAVENOUS

## 2013-06-11 MED ORDER — BUPIVACAINE HCL (PF) 0.25 % IJ SOLN
INTRAMUSCULAR | Status: AC
  Filled 2013-06-11: qty 10

## 2013-06-11 SURGICAL SUPPLY — 64 items
BANDAGE ELASTIC 3 VELCRO ST LF (GAUZE/BANDAGES/DRESSINGS) ×2 IMPLANT
BANDAGE ELASTIC 4 VELCRO ST LF (GAUZE/BANDAGES/DRESSINGS) ×2 IMPLANT
BANDAGE GAUZE ELAST BULKY 4 IN (GAUZE/BANDAGES/DRESSINGS) ×2 IMPLANT
BNDG CMPR 9X4 STRL LF SNTH (GAUZE/BANDAGES/DRESSINGS) ×1
BNDG ESMARK 4X9 LF (GAUZE/BANDAGES/DRESSINGS) ×2 IMPLANT
CLOTH BEACON ORANGE TIMEOUT ST (SAFETY) ×2 IMPLANT
CORDS BIPOLAR (ELECTRODE) ×2 IMPLANT
COVER SURGICAL LIGHT HANDLE (MISCELLANEOUS) ×2 IMPLANT
CUFF TOURNIQUET SINGLE 18IN (TOURNIQUET CUFF) IMPLANT
CUFF TOURNIQUET SINGLE 24IN (TOURNIQUET CUFF) ×2 IMPLANT
DECANTER SPIKE VIAL GLASS SM (MISCELLANEOUS) IMPLANT
DRAPE OEC MINIVIEW 54X84 (DRAPES) IMPLANT
DRAPE SURG 17X23 STRL (DRAPES) ×2 IMPLANT
DRSG PAD ABDOMINAL 8X10 ST (GAUZE/BANDAGES/DRESSINGS) ×2 IMPLANT
DURAPREP 26ML APPLICATOR (WOUND CARE) ×2 IMPLANT
GAUZE XEROFORM 1X8 LF (GAUZE/BANDAGES/DRESSINGS) ×2 IMPLANT
GAUZE XEROFORM 5X9 LF (GAUZE/BANDAGES/DRESSINGS) IMPLANT
GLOVE BIO SURGEON STRL SZ8.5 (GLOVE) ×2 IMPLANT
GLOVE BIOGEL PI IND STRL 8 (GLOVE) IMPLANT
GLOVE BIOGEL PI INDICATOR 8 (GLOVE)
GLOVE SS BIOGEL STRL SZ 7 (GLOVE) ×2 IMPLANT
GLOVE SUPERSENSE BIOGEL SZ 7 (GLOVE) ×2
GOWN PREVENTION PLUS XLARGE (GOWN DISPOSABLE) ×2 IMPLANT
GOWN STRL NON-REIN LRG LVL3 (GOWN DISPOSABLE) ×2 IMPLANT
KIT BASIN OR (CUSTOM PROCEDURE TRAY) ×2 IMPLANT
KIT ROOM TURNOVER OR (KITS) ×2 IMPLANT
LOOP VESSEL MAXI BLUE (MISCELLANEOUS) IMPLANT
MANIFOLD NEPTUNE II (INSTRUMENTS) ×2 IMPLANT
NEEDLE HYPO 25GX1X1/2 BEV (NEEDLE) ×2 IMPLANT
NS IRRIG 1000ML POUR BTL (IV SOLUTION) ×2 IMPLANT
PACK ORTHO EXTREMITY (CUSTOM PROCEDURE TRAY) ×2 IMPLANT
PAD ARMBOARD 7.5X6 YLW CONV (MISCELLANEOUS) ×4 IMPLANT
PAD CAST 4YDX4 CTTN HI CHSV (CAST SUPPLIES) IMPLANT
PADDING CAST ABS 3INX4YD NS (CAST SUPPLIES) ×1
PADDING CAST ABS 4INX4YD NS (CAST SUPPLIES) ×1
PADDING CAST ABS COTTON 3X4 (CAST SUPPLIES) ×1 IMPLANT
PADDING CAST ABS COTTON 4X4 ST (CAST SUPPLIES) ×1 IMPLANT
PADDING CAST COTTON 4X4 STRL (CAST SUPPLIES)
SPEAR EYE SURG WECK-CEL (MISCELLANEOUS) IMPLANT
SPECIMEN JAR SMALL (MISCELLANEOUS) ×2 IMPLANT
SPONGE GAUZE 4X4 12PLY (GAUZE/BANDAGES/DRESSINGS) ×2 IMPLANT
STRIP CLOSURE SKIN 1/2X4 (GAUZE/BANDAGES/DRESSINGS) IMPLANT
SUCTION FRAZIER TIP 10 FR DISP (SUCTIONS) ×2 IMPLANT
SUT ETHIBOND 3-0 V-5 (SUTURE) IMPLANT
SUT ETHILON 3 0 PS 1 (SUTURE) ×4 IMPLANT
SUT ETHILON 4 0 P 3 18 (SUTURE) ×2 IMPLANT
SUT ETHILON 4 0 PS 2 18 (SUTURE) ×2 IMPLANT
SUT ETHILON 5 0 PS 2 18 (SUTURE) IMPLANT
SUT FIBERWIRE 3-0 18 TAPR NDL (SUTURE) ×2
SUT MERSILENE 4 0 P 3 (SUTURE) IMPLANT
SUT PROLENE 3 0 PS 2 (SUTURE) IMPLANT
SUT SILK 4 0 PS 2 (SUTURE) IMPLANT
SUT VIC AB 2-0 CT3 27 (SUTURE) ×2 IMPLANT
SUT VIC AB 3-0 FS2 27 (SUTURE) IMPLANT
SUT VICRYL RAPIDE 4/0 PS 2 (SUTURE) IMPLANT
SUTURE FIBERWR 3-0 18 TAPR NDL (SUTURE) ×1 IMPLANT
SWAB COLLECTION DEVICE MRSA (MISCELLANEOUS) ×2 IMPLANT
SYR CONTROL 10ML LL (SYRINGE) ×2 IMPLANT
TOWEL OR 17X24 6PK STRL BLUE (TOWEL DISPOSABLE) ×2 IMPLANT
TOWEL OR 17X26 10 PK STRL BLUE (TOWEL DISPOSABLE) ×2 IMPLANT
TUBE ANAEROBIC SPECIMEN COL (MISCELLANEOUS) ×2 IMPLANT
TUBE CONNECTING 12X1/4 (SUCTIONS) ×2 IMPLANT
UNDERPAD 30X30 INCONTINENT (UNDERPADS AND DIAPERS) ×2 IMPLANT
WATER STERILE IRR 1000ML POUR (IV SOLUTION) ×2 IMPLANT

## 2013-06-11 NOTE — ED Notes (Signed)
Dressing removed Right Left lower arms

## 2013-06-11 NOTE — Transfer of Care (Signed)
Immediate Anesthesia Transfer of Care Note  Patient: [name]  Procedure(s) Performed: Procedure(s): EXPLORATION AND REPAIR LEFT FOREARM (Left)  Patient Location: PACU  Anesthesia Type:General  Level of Consciousness: awake, alert , oriented and sedated  Airway & Oxygen Therapy: Patient Spontanous Breathing and Patient connected to face mask oxygen  Post-op Assessment: Report given to PACU RN, Post -op Vital signs reviewed and stable and Patient moving all extremities  Post vital signs: Reviewed and stable  Complications: No apparent anesthesia complications

## 2013-06-11 NOTE — Consult Note (Signed)
Reason for Consult:multiple dog bites Referring Physician: ER  [name] is an 52 y.o. female.  HPI: s/p multiple bilateral forearm dogbites with weakness in left digital extension and drainage  Past Medical History  Diagnosis Date  . Hypertriglyceridemia     164 - 2/11  . Panic disorder 11/05  . Obesity     250 lbs 8/11  . Atypical chest pain     Negative cardiolite at [facility] cards 11/07  . Hypothyroidism   . Insomnia   . Allergic rhinitis   . Elevated BP   . Anginal pain   . Shortness of breath 05/18/2012    "at rest; lying down; w/exertion"  . Perirectal abscess 11/14/2011  . CHOLECYSTECTOMY, HX OF     Annotation: 2000 Qualifier: Diagnosis of  By: Renae Fickle MD, Bhakti    . History of blood transfusion 03/2008    Requried 2u prbc; menometrorrhagia, uterine fibroids.  . Anemia, iron deficiency     Past Surgical History  Procedure Laterality Date  . Incision and drainage perirectal abscess  11/14/2011    Procedure: IRRIGATION AND DEBRIDEMENT PERIRECTAL ABSCESS;  Surgeon: Liz Malady, MD;  Location: [facility] OR;  Service: General;  Laterality: N/A;  . Abdominal hysterectomy  04/2007    total abdominal hysterectomy ( cervix) with bilateral salpingo-oopherectomy  . Dilation and curettage of uterus  1987  . Incision and drainage abscess  11/02/2012    Procedure: INCISION AND DRAINAGE ABSCESS;  Surgeon: Adolph Pollack, MD;  Location: [facility] OR;  Service: General;  Laterality: N/A;  I&D anorectal abscess  . Cholecystectomy  07/30/1999    Diagnosis of  By: Renae Fickle MD, Bhakti      Family History  Problem Relation Age of Onset  . Heart attack Mother     at the age of 56's  . Lung cancer Mother     metestatic, + smoker  . Aneurysm Mother   . Hyperlipidemia Mother   . Hypertension Mother   . Hypertension Father   . Heart attack Maternal Grandmother     at the age of 36's    Social History:  reports that she has never smoked. She has never used smokeless tobacco. She reports that   drinks alcohol. She reports that she does not use illicit drugs.  Allergies:  Allergies  Allergen Reactions  . Codeine Itching  . Doxycycline Nausea And Vomiting  . Hydrocodone Itching    All over the body one hour after administration  . Oxycodone Itching    All over the body after an hour of administration.  . Sulfamethoxazole W-Trimethoprim Nausea And Vomiting    Medications: Scheduled:  No results found for this or any previous visit (from the past 48 hour(s)).  No results found.  Review of Systems  All other systems reviewed and are negative.   Blood pressure 121/62, pulse 95, temperature 98.3 F (36.8 C), temperature source Oral, resp. rate 18, height 5\' 1"  (1.549 m), weight 119.296 kg (263 lb), last menstrual period 06/17/2007, SpO2 95.00%. Physical Exam  Constitutional: She is oriented to person, place, and time. She appears well-developed and well-nourished.  HENT:  Head: Normocephalic and atraumatic.  Cardiovascular: Normal rate.   Respiratory: Effort normal.  Musculoskeletal:       Left forearm: She exhibits swelling, edema and laceration.  Multiple dogbites to left forearm with wekness in digital extension  Neurological: She is alert and oriented to person, place, and time.  Skin: Skin is warm. There is erythema.  Psychiatric:  She has a normal mood and affect. Her behavior is normal. Judgment and thought content normal.    Assessment/Plan: As above   Plan explore and repair  Henley Boettner A 06/11/2013, 12:32 PM

## 2013-06-11 NOTE — ED Provider Notes (Signed)
Medical screening examination/treatment/procedure(s) were performed by non-physician practitioner and as supervising physician I was immediately available for consultation/collaboration.   Sheyann Sulton T Conall Vangorder, MD 06/11/13 1720 

## 2013-06-11 NOTE — Op Note (Signed)
See note [id number]

## 2013-06-11 NOTE — ED Provider Notes (Signed)
CSN: [id number]     Arrival date & time 06/11/13  1033 History   First MD Initiated Contact with Patient 06/11/13 1142     Chief Complaint  Patient presents with  . Wound Check   (Consider location/radiation/quality/duration/timing/severity/associated sxs/prior Treatment) HPI Comments: Patient seen in the emergency department 2 days ago for multiple dog bite wounds presents today for recheck per instructions. States she has been taking the antibiotics and has not done anything to the wounds since she was discharged. Denies any particular concerns. Denies fevers. States she has been trying not to look at them but hasn't noticed any changes.  Patient is a 52 y.o. female presenting with wound check. The history is provided by the patient.  Wound Check Pertinent negatives include no chills, fever, numbness or weakness.    Past Medical History  Diagnosis Date  . Hypertriglyceridemia     164 - 2/11  . Panic disorder 11/05  . Obesity     250 lbs 8/11  . Atypical chest pain     Negative cardiolite at [facility] cards 11/07  . Hypothyroidism   . Insomnia   . Allergic rhinitis   . Elevated BP   . Anginal pain   . Shortness of breath 05/18/2012    "at rest; lying down; w/exertion"  . Perirectal abscess 11/14/2011  . CHOLECYSTECTOMY, HX OF     Annotation: 2000 Qualifier: Diagnosis of  By: Renae Fickle MD, Bhakti    . History of blood transfusion 03/2008    Requried 2u prbc; menometrorrhagia, uterine fibroids.  . Anemia, iron deficiency    Past Surgical History  Procedure Laterality Date  . Incision and drainage perirectal abscess  11/14/2011    Procedure: IRRIGATION AND DEBRIDEMENT PERIRECTAL ABSCESS;  Surgeon: Liz Malady, MD;  Location: [facility] OR;  Service: General;  Laterality: N/A;  . Abdominal hysterectomy  04/2007    total abdominal hysterectomy ( cervix) with bilateral salpingo-oopherectomy  . Dilation and curettage of uterus  1987  . Incision and drainage abscess  11/02/2012    Procedure:  INCISION AND DRAINAGE ABSCESS;  Surgeon: Adolph Pollack, MD;  Location: [facility] OR;  Service: General;  Laterality: N/A;  I&D anorectal abscess  . Cholecystectomy  07/30/1999    Diagnosis of  By: Renae Fickle MD, Bhakti     Family History  Problem Relation Age of Onset  . Heart attack Mother     at the age of 24's  . Lung cancer Mother     metestatic, + smoker  . Aneurysm Mother   . Hyperlipidemia Mother   . Hypertension Mother   . Hypertension Father   . Heart attack Maternal Grandmother     at the age of 10's   History  Substance Use Topics  . Smoking status: Never Smoker   . Smokeless tobacco: Never Used  . Alcohol Use: Yes     Comment: 05/18/2012 "have a taste of a drink maybe once/yr"   OB History   Grav Para Term Preterm Abortions TAB SAB Ect Mult Living                 Review of Systems  Constitutional: Negative for fever and chills.  Skin: Positive for wound.  Neurological: Negative for weakness and numbness.    Allergies  Codeine; Doxycycline; Hydrocodone; Oxycodone; and Sulfamethoxazole w-trimethoprim  Home Medications   Current Outpatient Rx  Name  Route  Sig  Dispense  Refill  . amoxicillin-clavulanate (AUGMENTIN) 875-125 MG per tablet   Oral  Take 1 tablet by mouth 2 (two) times daily. For 10 days; Start date 06/09/13         . aspirin 325 MG tablet   Oral   Take 325 mg by mouth daily.         . hydrochlorothiazide (MICROZIDE) 12.5 MG capsule   Oral   Take 1 capsule (12.5 mg total) by mouth daily.   30 capsule   1   . levothyroxine (SYNTHROID) 75 MCG tablet   Oral   Take 1 tablet (75 mcg total) by mouth daily.   30 tablet   11   . PARoxetine (PAXIL) 20 MG tablet   Oral   Take 1 tablet (20 mg total) by mouth daily.   90 tablet   4   . traMADol (ULTRAM) 50 MG tablet   Oral   Take 1 tablet (50 mg total) by mouth every 6 (six) hours as needed for pain.   20 tablet   0    BP 121/62  Pulse 95  Temp(Src) 98.3 F (36.8 C) (Oral)  Resp 18   Ht 5\' 1"  (1.549 m)  Wt 263 lb (119.296 kg)  BMI 49.72 kg/m2  SpO2 95%  LMP 06/17/2007 Physical Exam  Nursing note and vitals reviewed. Constitutional: She appears well-developed and well-nourished. No distress.  HENT:  Head: Normocephalic and atraumatic.  Neck: Neck supple.  Pulmonary/Chest: Effort normal.    Musculoskeletal:  Left hand with decreased range of motion of fingers.  Neurological: She is alert.  Skin: She is not diaphoretic.       ED Course  Procedures (including critical care time) Labs Review Labs Reviewed - No data to display Imaging Review Dg Chest 2 View  06/09/2013   *RADIOLOGY REPORT*  Clinical Data: Dog bite, laceration to left breast, evaluate for foreign body  CHEST - 2 VIEW  Comparison: 11/02/2012  Findings: Lungs are clear. No pleural effusion or pneumothorax.  The heart is normal in size.  Mild degenerative changes of the visualized thoracolumbar spine.  No radiopaque foreign body is seen.  IMPRESSION: No evidence of acute cardiopulmonary disease.  No radiopaque foreign body is seen.   Original Report Authenticated By: Charline Bills, M.D.   Dg Forearm Left  06/09/2013   *RADIOLOGY REPORT*  Clinical Data: Dog bite, laceration to distal forearm  LEFT FOREARM - 2 VIEW  Comparison: None.  Findings: Soft tissue swelling/laceration overlying the mid forearm and the dorsum of the lower wrist.  No fracture or dislocation is seen.  The joint spaces are preserved.  No radiopaque foreign body is seen.  IMPRESSION: Soft tissue swelling/laceration overlying the mid forearm and lower wrist.  No fracture, dislocation, or radiopaque foreign body is seen.   Original Report Authenticated By: Charline Bills, M.D.   Dg Forearm Right  06/09/2013   *RADIOLOGY REPORT*  Clinical Data: Dog bite, laceration to distal forearm  RIGHT FOREARM - 2 VIEW  Comparison: None.  Findings: Soft tissue swelling/laceration overlying the ventral aspect of the mid forearm.  No fracture or  dislocation is seen.  The joint spaces are preserved.  No radiopaque foreign body is seen.  IMPRESSION: Soft tissue swelling/laceration overlying the ventral aspect of the mid forearm.  No fracture, dislocation, or radiopaque foreign body is seen.   Original Report Authenticated By: Charline Bills, M.D.   Dg Hand Complete Left  06/09/2013   *RADIOLOGY REPORT*  Clinical Data: Dog bite to left hand, decreased range of motion to fourth digit  LEFT HAND -  COMPLETE 3+ VIEW  Comparison: None.  Findings: No fracture or dislocation is seen.  The joint spaces are preserved.  Visualized soft tissues are grossly unremarkable.  No radiopaque foreign body is seen.  IMPRESSION: No fracture, dislocation, or radiopaque foreign body is seen.   Original Report Authenticated By: Charline Bills, M.D.   12:20 PM Dr Mina Marble here to see patient.    MDM  No diagnosis found. Patient with multiple dog bite wounds 2 days ago. While I was seeing the patient Dr. Mina Marble came to see the patient and took her to the operating room to explore her left forearm wounds that may be causing some weakness in her fingers. Please see his note for further details. I do not see any external evidence of wound infection on the right arm and left chest wall. Patient admitted to Dr. Mina Marble and taken to the OR    Campbell, PA-C 06/11/13 1415

## 2013-06-11 NOTE — Op Note (Signed)
[name], [name]               ACCOUNT NO.:  [id number]  MEDICAL RECORD NO.:  [id number]  LOCATION:  [facility]                         FACILITY:  [facility]  PHYSICIAN:  Artist Pais. Greenleigh Kauth, M.D.DATE OF BIRTH:  February 01, 1961  DATE OF PROCEDURE:  06/11/2013 DATE OF DISCHARGE:  06/11/2013                              OPERATIVE REPORT   PREOPERATIVE DIAGNOSIS:  Left forearm multiple dog bites with weakness in extension.  POSTOPERATIVE DIAGNOSIS:  Left forearm multiple dog bites with weakness in extension.  PROCEDURES:  Exploration multiple dog bites, left forearm with repair of extensor digitorum communis to the ring and small finger with tendon transfers side-to-side to the long and index.  SURGEON:  Artist Pais. Mina Marble, M.D.  ASSISTANT:  None.  ANESTHESIA:  General.  No complications.  No drains.  DESCRIPTION OF PROCEDURE:  The patient was taken to the operating suite. After induction of adequate general anesthesia, left upper extremity was prepped and draped in usual sterile fashion.  An Esmarch was used to exsanguinate the limb.  Tourniquet was inflated to 250 mmHg.  At this point in time, Steri-Strips that were placed in the emergency room 48 hours ago were removed.  There were multiple wounds over the dorsal and volar aspects of the forearm.  The volar wounds were opened, the dorsal wounds were opened.  They were cultured, there was some cloudy fluid, but no purulence was encountered.  The wounds were thoroughly irrigated and debrided of nonviable material.  Once this was done, dissection was carried down to the extensor musculotendinous junction area in the mid to proximal third of the forearm and dissection was carried down to the [facility].  At the musculotendinous junction, there was lacerations and significant muscle loss to the EDC to the ring and small fingers.  These were then transferred end to side to the [facility] to the index and long, and the muscle was repaired end to side as  well.  Hemostasis was achieved with bipolar cautery and the wounds were then loosely closed with 3-0 nylon.  Xeroform, 4x4s, fluffs, and a volar splint was applied.  The patient tolerated the procedure well, went to the recovery room in a stable fashion.     Artist Pais Mina Marble, M.D.    MAW/MEDQ  D:  06/11/2013  T:  06/11/2013  Job:  [id number]

## 2013-06-11 NOTE — Anesthesia Preprocedure Evaluation (Addendum)
Anesthesia Evaluation  Patient identified by MRN, date of birth, ID band Patient awake    Reviewed: Allergy & Precautions, H&P , NPO status , Patient's Chart, lab work & pertinent test results  Airway Mallampati: II TM Distance: >3 FB Neck ROM: Full    Dental no notable dental hx. (+) Edentulous Upper and Dental Advisory Given   Pulmonary shortness of breath,  breath sounds clear to auscultation  Pulmonary exam normal       Cardiovascular negative cardio ROS  Rhythm:Regular Rate:Normal     Neuro/Psych PSYCHIATRIC DISORDERS negative neurological ROS     GI/Hepatic negative GI ROS, Neg liver ROS,   Endo/Other  Hypothyroidism Morbid obesity  Renal/GU negative Renal ROS  negative genitourinary   Musculoskeletal   Abdominal   Peds  Hematology negative hematology ROS (+)   Anesthesia Other Findings   Reproductive/Obstetrics negative OB ROS                          Anesthesia Physical Anesthesia Plan  ASA: III and emergent  Anesthesia Plan: General   Post-op Pain Management:    Induction: Intravenous, Rapid sequence and Cricoid pressure planned  Airway Management Planned: Oral ETT  Additional Equipment:   Intra-op Plan:   Post-operative Plan: Extubation in OR  Informed Consent: I have reviewed the patients History and Physical, chart, labs and discussed the procedure including the risks, benefits and alternatives for the proposed anesthesia with the patient or authorized representative who has indicated his/her understanding and acceptance.   Dental advisory given  Plan Discussed with: CRNA  Anesthesia Plan Comments:         Anesthesia Quick Evaluation

## 2013-06-11 NOTE — Preoperative (Signed)
Beta Blockers   Reason not to administer Beta Blockers:Not Applicable 

## 2013-06-11 NOTE — ED Notes (Signed)
MD at bedside.Dr. Mina Marble

## 2013-06-11 NOTE — ED Notes (Signed)
Pt here for re-check of wounds from Dog bite on Friday. Pt has not removed the bandages placed on Friday.

## 2013-06-11 NOTE — ED Notes (Signed)
OR ready for pt. Pt transported to OR. Informed surgeon of IV difficulties and OR staff stated that they would do the IV in OR.

## 2013-06-11 NOTE — Anesthesia Postprocedure Evaluation (Signed)
  Anesthesia Post-op Note  Patient: [name]  Procedure(s) Performed: Procedure(s): EXPLORATION AND REPAIR LEFT FOREARM (Left)  Patient Location: PACU  Anesthesia Type:General  Level of Consciousness: awake, alert  and oriented  Airway and Oxygen Therapy: Patient Spontanous Breathing  Post-op Pain: mild  Post-op Assessment: Post-op Vital signs reviewed, Patient's Cardiovascular Status Stable, Respiratory Function Stable, Patent Airway and No signs of Nausea or vomiting  Post-op Vital Signs: Reviewed and stable  Complications: No apparent anesthesia complications

## 2013-06-13 ENCOUNTER — Encounter (HOSPITAL_COMMUNITY): Payer: Self-pay | Admitting: Orthopedic Surgery

## 2013-06-14 LAB — WOUND CULTURE

## 2013-06-16 LAB — ANAEROBIC CULTURE

## 2013-06-20 ENCOUNTER — Ambulatory Visit: Payer: No Typology Code available for payment source | Attending: Orthopedic Surgery | Admitting: *Deleted

## 2013-06-20 DIAGNOSIS — M25649 Stiffness of unspecified hand, not elsewhere classified: Secondary | ICD-10-CM | POA: Insufficient documentation

## 2013-06-20 DIAGNOSIS — Z4889 Encounter for other specified surgical aftercare: Secondary | ICD-10-CM | POA: Insufficient documentation

## 2013-06-20 DIAGNOSIS — M25539 Pain in unspecified wrist: Secondary | ICD-10-CM | POA: Insufficient documentation

## 2013-06-20 DIAGNOSIS — IMO0001 Reserved for inherently not codable concepts without codable children: Secondary | ICD-10-CM | POA: Insufficient documentation

## 2013-06-21 ENCOUNTER — Ambulatory Visit: Payer: No Typology Code available for payment source | Admitting: Occupational Therapy

## 2013-06-27 ENCOUNTER — Ambulatory Visit: Payer: No Typology Code available for payment source | Admitting: Occupational Therapy

## 2013-06-29 ENCOUNTER — Ambulatory Visit (INDEPENDENT_AMBULATORY_CARE_PROVIDER_SITE_OTHER): Payer: No Typology Code available for payment source | Admitting: Internal Medicine

## 2013-06-29 ENCOUNTER — Encounter: Payer: Self-pay | Admitting: Internal Medicine

## 2013-06-29 ENCOUNTER — Encounter: Payer: Self-pay | Admitting: *Deleted

## 2013-06-29 VITALS — Temp 98.3°F | Wt 252.0 lb

## 2013-06-29 DIAGNOSIS — T07XXXA Unspecified multiple injuries, initial encounter: Secondary | ICD-10-CM

## 2013-06-29 DIAGNOSIS — Z5189 Encounter for other specified aftercare: Secondary | ICD-10-CM

## 2013-06-29 DIAGNOSIS — S51859D Open bite of unspecified forearm, subsequent encounter: Secondary | ICD-10-CM

## 2013-06-29 DIAGNOSIS — W540XXA Bitten by dog, initial encounter: Secondary | ICD-10-CM

## 2013-06-29 NOTE — Progress Notes (Deleted)
Patient ID: [name], female   DOB: 01-Oct-1961, 52 y.o.   MRN: [id number]

## 2013-06-29 NOTE — Progress Notes (Signed)
Patient came in today after having surgery from a dog bite around August 28.Patient thought we could take stiches out to save her money from going to Dr. Mariea Stable. I called the Office and spoke with the PA,Robert about this and he said there officie had been trying to get in touch with the patient to come in sooner to have stiches removed but were unable to get in touch with the patient. I put patient on the phone with the officie and they will see her on September 19, @2 :30.

## 2013-06-30 DIAGNOSIS — S51859A Open bite of unspecified forearm, initial encounter: Secondary | ICD-10-CM | POA: Insufficient documentation

## 2013-06-30 NOTE — Assessment & Plan Note (Signed)
Patient had multiple dog bite wounds and underwent exploration and left FA repair recently. Patient thought we could take stiches out to save her money from going to Dr. Mariea Stable. She also states that she was just evaluated by her surgeon three days ago and her condition is better now. Physical exam reveals nonhealing left FA wound with some yellowish discharge. Multiple stitches noted.  Given her clinical presentations, I do not feel comfortable to remove her stitches.  Dr. Ronie Spies office contacted, and the PA, Molly Maduro stated that his officie had been trying to locate her but was unable to reach patient for a follow up wound check. Patient was able to talk to Molly Maduro on the phone and they will see her tomorrow ar 230 pm.  Patient left the clinic during the middle of OV as soon as above appt was set up.

## 2013-06-30 NOTE — Progress Notes (Signed)
Subjective:   Patient ID: [name] female   DOB: [birth year] 52 y.o.   MRN: [id number]  Chief complains: calf pain and follow up on Thyroid function  HPI: [name] is a 52 y.o. women with a PMH of atypical chest pain with Negative Cardiolite at [place] cards 11/07 and 08/13, Obesity, HTN, HLD, hypothyroidism, panic disorder, presents to the outpatient clinic for follow up visit.  # Multiple dog bite wounds Patient had dog bites with multiple lacerations/puncture wounds of BL dorsal and volar forearms and L breast, and she was initially evaluated at the [facility] ED on 06/09/13. Hand surgeon Dr. Mina Marble was consulted and patient was started on Augmentin. Patient underwent surgical " Exploration multiple dog bites, left forearm with repair of extensor digitorum communis to the ring and small finger with tendon transfers side-to-side to the long and index" By Dr. Mina Marble on 06/11/13.  Patient states that she was just evaluated by Dr. Mina Marble three days ago, and she states that she is here for stitches removal.    Past Medical History  Diagnosis Date  . Hypertriglyceridemia     164 - 2/11  . Panic disorder 11/05  . Obesity     250 lbs 8/11  . Atypical chest pain     Negative cardiolite at [facility] cards 11/07  . Hypothyroidism   . Insomnia   . Allergic rhinitis   . Elevated BP   . Anginal pain   . Shortness of breath 05/18/2012    "at rest; lying down; w/exertion"  . Perirectal abscess 11/14/2011  . CHOLECYSTECTOMY, HX OF     Annotation: 2000 Qualifier: Diagnosis of  By: Renae Fickle MD, Bhakti    . History of blood transfusion 03/2008    Requried 2u prbc; menometrorrhagia, uterine fibroids.  . Anemia, iron deficiency    Current Outpatient Prescriptions  Medication Sig Dispense Refill  . aspirin 325 MG tablet Take 325 mg by mouth daily.      . hydrochlorothiazide (MICROZIDE) 12.5 MG capsule Take 1 capsule (12.5 mg total) by mouth daily.  30 capsule  1  . levothyroxine (SYNTHROID)  75 MCG tablet Take 1 tablet (75 mcg total) by mouth daily.  30 tablet  11  . PARoxetine (PAXIL) 20 MG tablet Take 1 tablet (20 mg total) by mouth daily.  90 tablet  4  . traMADol (ULTRAM) 50 MG tablet Take 1 tablet (50 mg total) by mouth every 6 (six) hours as needed for pain.  20 tablet  0  . amoxicillin-clavulanate (AUGMENTIN) 875-125 MG per tablet Take 1 tablet by mouth 2 (two) times daily. For 10 days; Start date 06/09/13       No current facility-administered medications for this visit.   Family History  Problem Relation Age of Onset  . Heart attack Mother     at the age of 14's  . Lung cancer Mother     metestatic, + smoker  . Aneurysm Mother   . Hyperlipidemia Mother   . Hypertension Mother   . Hypertension Father   . Heart attack Maternal Grandmother     at the age of 71's   History   Social History  . Marital Status: Married    Spouse Name: N/A    Number of Children: N/A  . Years of Education: N/A   Social History Main Topics  . Smoking status: Never Smoker   . Smokeless tobacco: Never Used  . Alcohol Use: Yes     Comment: 05/18/2012 "have a taste  of a drink maybe once/yr"  . Drug Use: No  . Sexual Activity: Not Currently   Other Topics Concern  . None   Social History Narrative   Currently unemployed. Starting college in Aug for accounting.  Married, has 2 children lives with her youngest son.     Review of Systems: Review of Systems:  Constitutional:  Denies fever, chills, diaphoresis, appetite change and positive fatigue.   HEENT:  Denies congestion, sore throat, rhinorrhea, sneezing, mouth sores, trouble swallowing, neck pain   Respiratory:  Denies SOB, DOE, cough, and wheezing.   Cardiovascular:  Denies palpitations and leg swelling.   Gastrointestinal:  Denies nausea, vomiting, abdominal pain, diarrhea, constipation, blood in stool and abdominal distention.   Genitourinary:  Denies dysuria, urgency, frequency, hematuria, flank pain and difficulty  urinating.   Musculoskeletal:  Denies myalgias, back pain.  Skin:  Denies pallor, rash. Multiple dog bites wounds.   Neurological:  Denies dizziness, seizures, syncope, weakness, light-headedness, numbness and headaches.    .   Objective:  Physical Exam: Filed Vitals:   06/29/13 1702  Temp: 98.3 F (36.8 C)  Weight: 252 lb (114.306 kg)   General: NAD Chest: multiple healing dog bite wound on left chest/breast area.  Msk: no joint warmth, and no redness over joints.   Pulses: 2+ DP/PT pulses bilaterally Extremities: No cyanosis, clubbing, edema. multiple healing dog bite wound on right arm. Multiple nonhealing wound on left forearm area with yellowish discharge noted. Surrounding skin mild erythema noted. No swelling tenderness to palpation or warmth to touch. Multiple stitches noted ln left arm wound.  Neurologic: alert & oriented X3.    Assessment & Plan:

## 2013-06-30 NOTE — Patient Instructions (Signed)
-   follow up with Dr. Mina Marble office at 230 pm on 06/30/13.

## 2013-06-30 NOTE — Progress Notes (Signed)
INTERNAL MEDICINE TEACHING ATTENDING ADDENDUM - Jonah Blue, DO, FACP: I reviewed with the resident Dr. Renato Battles Levinson's  medical history, physical examination, diagnosis and results of tests and treatment and I agree with the patient's care as documented.

## 2013-07-06 ENCOUNTER — Ambulatory Visit: Payer: No Typology Code available for payment source | Admitting: Occupational Therapy

## 2013-07-11 ENCOUNTER — Ambulatory Visit: Payer: No Typology Code available for payment source | Admitting: Occupational Therapy

## 2013-07-12 ENCOUNTER — Encounter: Payer: No Typology Code available for payment source | Admitting: Occupational Therapy

## 2013-07-19 ENCOUNTER — Ambulatory Visit: Payer: No Typology Code available for payment source | Attending: Orthopedic Surgery | Admitting: Occupational Therapy

## 2013-07-19 DIAGNOSIS — Z4889 Encounter for other specified surgical aftercare: Secondary | ICD-10-CM | POA: Insufficient documentation

## 2013-07-19 DIAGNOSIS — IMO0001 Reserved for inherently not codable concepts without codable children: Secondary | ICD-10-CM | POA: Insufficient documentation

## 2013-07-19 DIAGNOSIS — M25539 Pain in unspecified wrist: Secondary | ICD-10-CM | POA: Insufficient documentation

## 2013-07-19 DIAGNOSIS — M25649 Stiffness of unspecified hand, not elsewhere classified: Secondary | ICD-10-CM | POA: Insufficient documentation

## 2013-07-25 ENCOUNTER — Ambulatory Visit: Payer: No Typology Code available for payment source | Admitting: Occupational Therapy

## 2013-07-26 ENCOUNTER — Ambulatory Visit: Payer: No Typology Code available for payment source

## 2013-07-27 ENCOUNTER — Ambulatory Visit: Payer: No Typology Code available for payment source | Admitting: Occupational Therapy

## 2013-08-01 ENCOUNTER — Ambulatory Visit: Payer: No Typology Code available for payment source | Admitting: Occupational Therapy

## 2013-08-03 ENCOUNTER — Ambulatory Visit: Payer: No Typology Code available for payment source | Admitting: Occupational Therapy

## 2013-08-08 ENCOUNTER — Encounter: Payer: No Typology Code available for payment source | Admitting: Occupational Therapy

## 2013-08-10 ENCOUNTER — Encounter: Payer: No Typology Code available for payment source | Admitting: Occupational Therapy

## 2013-08-16 ENCOUNTER — Ambulatory Visit: Payer: No Typology Code available for payment source | Attending: Orthopedic Surgery | Admitting: Occupational Therapy

## 2013-08-16 DIAGNOSIS — Z4889 Encounter for other specified surgical aftercare: Secondary | ICD-10-CM | POA: Insufficient documentation

## 2013-08-16 DIAGNOSIS — M25649 Stiffness of unspecified hand, not elsewhere classified: Secondary | ICD-10-CM | POA: Insufficient documentation

## 2013-08-16 DIAGNOSIS — IMO0001 Reserved for inherently not codable concepts without codable children: Secondary | ICD-10-CM | POA: Insufficient documentation

## 2013-08-16 DIAGNOSIS — M25539 Pain in unspecified wrist: Secondary | ICD-10-CM | POA: Insufficient documentation

## 2013-08-17 ENCOUNTER — Other Ambulatory Visit: Payer: Self-pay

## 2013-08-17 ENCOUNTER — Ambulatory Visit: Payer: No Typology Code available for payment source | Admitting: Occupational Therapy

## 2013-08-21 ENCOUNTER — Ambulatory Visit: Payer: No Typology Code available for payment source | Admitting: Occupational Therapy

## 2013-08-23 ENCOUNTER — Ambulatory Visit: Payer: No Typology Code available for payment source | Admitting: Occupational Therapy

## 2013-08-30 ENCOUNTER — Ambulatory Visit: Payer: No Typology Code available for payment source | Admitting: Occupational Therapy

## 2013-09-01 ENCOUNTER — Ambulatory Visit (INDEPENDENT_AMBULATORY_CARE_PROVIDER_SITE_OTHER): Payer: No Typology Code available for payment source | Admitting: *Deleted

## 2013-09-01 ENCOUNTER — Ambulatory Visit: Payer: No Typology Code available for payment source | Admitting: Occupational Therapy

## 2013-09-01 DIAGNOSIS — Z23 Encounter for immunization: Secondary | ICD-10-CM

## 2013-09-18 ENCOUNTER — Telehealth: Payer: Self-pay | Admitting: Internal Medicine

## 2013-09-18 NOTE — Telephone Encounter (Signed)
Called Dr Casimiro Needle Mattingly's office ans spoke with his Nurse "Larita Fife"  Patient resch her appointment from 05/29/2013 to 07/06/2013 and no showed.  Patient's no showed appointment left service fee charge that this patient has yet to pay.  Patient per "Larita Fife" must call the office an settle her bill before she can make another appointment with their office.

## 2013-09-19 ENCOUNTER — Encounter: Payer: Self-pay | Admitting: Internal Medicine

## 2013-09-19 ENCOUNTER — Other Ambulatory Visit: Payer: Self-pay | Admitting: *Deleted

## 2013-09-19 MED ORDER — HYDROCHLOROTHIAZIDE 12.5 MG PO CAPS: 12.5000 mg | ORAL_CAPSULE | Freq: Every day | ORAL | Status: DC

## 2013-09-20 ENCOUNTER — Other Ambulatory Visit: Payer: Self-pay | Admitting: Internal Medicine

## 2013-10-19 ENCOUNTER — Encounter: Payer: No Typology Code available for payment source | Admitting: Internal Medicine

## 2013-11-16 ENCOUNTER — Ambulatory Visit (INDEPENDENT_AMBULATORY_CARE_PROVIDER_SITE_OTHER): Payer: Self-pay | Admitting: Internal Medicine

## 2013-11-16 ENCOUNTER — Encounter: Payer: Self-pay | Admitting: Internal Medicine

## 2013-11-16 VITALS — BP 130/80 | HR 80 | Temp 97.6°F | Ht 61.0 in | Wt 258.0 lb

## 2013-11-16 DIAGNOSIS — J069 Acute upper respiratory infection, unspecified: Secondary | ICD-10-CM

## 2013-11-16 NOTE — Assessment & Plan Note (Signed)
Assessment The presentation is suggestive of viral upper respiratory infection.  Physical examination is unremarkable except for mild erythema noted on pharyngeal area.  Plan -Drink plenty of fluids and rest well. -Tylenol 650 mg by mouth every 6 hours as needed for pain or fever - Patient is instructed to call the clinic if she has fever, chills, wheezing, shortness of breath, chest pain, chest pressure or worsening of her current symptoms.

## 2013-11-16 NOTE — Progress Notes (Signed)
Patient ID: [name], female   DOB: May 03, 1961, 53 y.o.   MRN: [id number]    Patient: [name]   MRN: [id number]  DOB: 03-03-1961  PCP: Charlann Lange, MD   Subjective:    CC: Sinusitis and URI   HPI: [name] is a 53 y.o. female with a PMHx as outlined below, who presented to clinic today for the following:  1) URI   Patient states that she started to have headache, sneezing, rhinorrhea, nasal congestion, post nasal drip, ear aching, nonproductive cough.. Denies fever, chills, sore throat, chest pain/pressure, SOB, wheezing, nausea, vomiting, abdominal pain, or weakness. She admits sick contact. She tried OTC cough medications with minimum improvement. She is here for the evaluation.   Health maintenance Patient would like to discuss her health maintenance once she is recovered from her cold.  Followup in 2 weeks.  The following are deferred. - Mammogram - Colonoscopy - Pap smear Review of Systems: Per HPI.   Current Outpatient Medications: Current Outpatient Prescriptions  Medication Sig Dispense Refill  . hydrochlorothiazide (MICROZIDE) 12.5 MG capsule Take 1 capsule (12.5 mg total) by mouth daily.  30 capsule  11  . levothyroxine (SYNTHROID) 75 MCG tablet Take 1 tablet (75 mcg total) by mouth daily.  30 tablet  11  . PARoxetine (PAXIL) 20 MG tablet Take 1 tablet (20 mg total) by mouth daily.  90 tablet  4   No current facility-administered medications for this visit.    Allergies: Allergies  Allergen Reactions  . Codeine Itching  . Doxycycline Nausea And Vomiting  . Hydrocodone Itching    All over the body one hour after administration  . Oxycodone Itching    All over the body after an hour of administration.  . Sulfamethoxazole-Trimethoprim Nausea And Vomiting    Past Medical History  Diagnosis Date  . Hypertriglyceridemia     164 - 2/11  . Panic disorder 11/05  . Obesity     250 lbs 8/11  . Atypical chest pain     Negative cardiolite at [facility]  cards 11/07  . Hypothyroidism   . Insomnia   . Allergic rhinitis   . Elevated BP   . Anginal pain   . Shortness of breath 05/18/2012    "at rest; lying down; w/exertion"  . Perirectal abscess 11/14/2011  . CHOLECYSTECTOMY, HX OF     Annotation: 2000 Qualifier: Diagnosis of  By: Eddie Dibbles MD, Bhakti    . History of blood transfusion 03/2008    Requried 2u prbc; menometrorrhagia, uterine fibroids.  . Anemia, iron deficiency     Objective:    Physical Exam: Filed Vitals:   11/16/13 1451  BP: 130/80  Pulse: 80  Temp: 97.6 F (36.4 C)  TempSrc: Oral  Height: 5\' 1"  (1.549 m)  Weight: 258 lb (117.028 kg)  SpO2: 100%      General: Vital signs reviewed and noted. Well-developed, well-nourished, in no acute distress; alert, appropriate and cooperative throughout examination.  Head: Normocephalic, atraumatic. Ear: No erythema or trauma noted on auricle and Tragus. No edema, erythema or frank necrosis noted on ear canal. The tympanic membrane intact with good light reflex. no presence of an air-fluid level along the tympanic membrane.  Nose: no erythema, swelling or ulceration noted Pharyngeal: Mild erythema noted. No exudates noted   Lungs:  Normal respiratory effort. Clear to auscultation BL without crackles or wheezes.  Heart: RRR. S1 and S2 normal without gallop, rubs,  murmur.  Abdomen:  BS  normoactive. Soft, Nondistended, non-tender.  No masses or organomegaly.  Extremities: No pretibial edema.   Assessment/ Plan:

## 2013-11-16 NOTE — Patient Instructions (Signed)
1. Please drink plenty of fluid 2. Rest well.  3. Take Tylenol 650 mg po every 6 hours as needed for fever or pain.  4. Call the clinic if you have fever, chill, wheezing, SOB or worsening of symptoms.    Upper Respiratory Infection, Adult An upper respiratory infection (URI) is also sometimes known as the common cold. The upper respiratory tract includes the nose, sinuses, throat, trachea, and bronchi. Bronchi are the airways leading to the lungs. Most people improve within 1 week, but symptoms can last up to 2 weeks. A residual cough may last even longer.  CAUSES Many different viruses can infect the tissues lining the upper respiratory tract. The tissues become irritated and inflamed and often become very moist. Mucus production is also common. A cold is contagious. You can easily spread the virus to others by oral contact. This includes kissing, sharing a glass, coughing, or sneezing. Touching your mouth or nose and then touching a surface, which is then touched by another person, can also spread the virus. SYMPTOMS  Symptoms typically develop 1 to 3 days after you come in contact with a cold virus. Symptoms vary from person to person. They may include:  Runny nose.  Sneezing.  Nasal congestion.  Sinus irritation.  Sore throat.  Loss of voice (laryngitis).  Cough.  Fatigue.  Muscle aches.  Loss of appetite.  Headache.  Low-grade fever. DIAGNOSIS  You might diagnose your own cold based on familiar symptoms, since most people get a cold 2 to 3 times a year. Your caregiver can confirm this based on your exam. Most importantly, your caregiver can check that your symptoms are not due to another disease such as strep throat, sinusitis, pneumonia, asthma, or epiglottitis. Blood tests, throat tests, and X-rays are not necessary to diagnose a common cold, but they may sometimes be helpful in excluding other more serious diseases. Your caregiver will decide if any further tests are  required. RISKS AND COMPLICATIONS  You may be at risk for a more severe case of the common cold if you smoke cigarettes, have chronic heart disease (such as heart failure) or lung disease (such as asthma), or if you have a weakened immune system. The very young and very old are also at risk for more serious infections. Bacterial sinusitis, middle ear infections, and bacterial pneumonia can complicate the common cold. The common cold can worsen asthma and chronic obstructive pulmonary disease (COPD). Sometimes, these complications can require emergency medical care and may be life-threatening. PREVENTION  The best way to protect against getting a cold is to practice good hygiene. Avoid oral or hand contact with people with cold symptoms. Wash your hands often if contact occurs. There is no clear evidence that vitamin C, vitamin E, echinacea, or exercise reduces the chance of developing a cold. However, it is always recommended to get plenty of rest and practice good nutrition. TREATMENT  Treatment is directed at relieving symptoms. There is no cure. Antibiotics are not effective, because the infection is caused by a virus, not by bacteria. Treatment may include:  Increased fluid intake. Sports drinks offer valuable electrolytes, sugars, and fluids.  Breathing heated mist or steam (vaporizer or shower).  Eating chicken soup or other clear broths, and maintaining good nutrition.  Getting plenty of rest.  Using gargles or lozenges for comfort.  Controlling fevers with ibuprofen or acetaminophen as directed by your caregiver.  Increasing usage of your inhaler if you have asthma. Zinc gel and zinc lozenges, taken in  the first 24 hours of the common cold, can shorten the duration and lessen the severity of symptoms. Pain medicines may help with fever, muscle aches, and throat pain. A variety of non-prescription medicines are available to treat congestion and runny nose. Your caregiver can make  recommendations and may suggest nasal or lung inhalers for other symptoms.  HOME CARE INSTRUCTIONS   Only take over-the-counter or prescription medicines for pain, discomfort, or fever as directed by your caregiver.  Use a warm mist humidifier or inhale steam from a shower to increase air moisture. This may keep secretions moist and make it easier to breathe.  Drink enough water and fluids to keep your urine clear or pale yellow.  Rest as needed.  Return to work when your temperature has returned to normal or as your caregiver advises. You may need to stay home longer to avoid infecting others. You can also use a face mask and careful hand washing to prevent spread of the virus. SEEK MEDICAL CARE IF:   After the first few days, you feel you are getting worse rather than better.  You need your caregiver's advice about medicines to control symptoms.  You develop chills, worsening shortness of breath, or brown or red sputum. These may be signs of pneumonia.  You develop yellow or brown nasal discharge or pain in the face, especially when you bend forward. These may be signs of sinusitis.  You develop a fever, swollen neck glands, pain with swallowing, or white areas in the back of your throat. These may be signs of strep throat. SEEK IMMEDIATE MEDICAL CARE IF:   You have a fever.  You develop severe or persistent headache, ear pain, sinus pain, or chest pain.  You develop wheezing, a prolonged cough, cough up blood, or have a change in your usual mucus (if you have chronic lung disease).  You develop sore muscles or a stiff neck. Document Released: 03/24/2001 Document Revised: 12/21/2011 Document Reviewed: 01/30/2011 [facility] Patient Information 2014 [place], [place].

## 2013-11-16 NOTE — Addendum Note (Signed)
Addended by: Zethan Alfieri on: 11/16/2013 04:59 PM   Modules accepted: Level of Service  

## 2013-11-17 ENCOUNTER — Ambulatory Visit: Payer: Self-pay

## 2013-11-20 NOTE — Progress Notes (Signed)
Case discussed with Dr. Li at the time of the visit.  We reviewed the resident's history and exam and pertinent patient test results.  I agree with the assessment, diagnosis, and plan of care documented in the resident's note. 

## 2013-11-20 NOTE — Progress Notes (Signed)
Case discussed with Dr. Li soon after the resident saw the patient. We reviewed the resident's history and exam and pertinent patient test results. I agree with the assessment, diagnosis, and plan of care documented in the resident's note. 

## 2013-11-30 ENCOUNTER — Encounter: Payer: Self-pay | Admitting: Internal Medicine

## 2014-02-07 ENCOUNTER — Ambulatory Visit (INDEPENDENT_AMBULATORY_CARE_PROVIDER_SITE_OTHER): Payer: Self-pay | Admitting: Internal Medicine

## 2014-02-07 VITALS — BP 133/80 | HR 69 | Temp 97.4°F | Ht 61.0 in | Wt 271.4 lb

## 2014-02-07 DIAGNOSIS — I10 Essential (primary) hypertension: Secondary | ICD-10-CM

## 2014-02-07 DIAGNOSIS — H60399 Other infective otitis externa, unspecified ear: Secondary | ICD-10-CM

## 2014-02-07 DIAGNOSIS — H6093 Unspecified otitis externa, bilateral: Secondary | ICD-10-CM

## 2014-02-07 DIAGNOSIS — R51 Headache: Secondary | ICD-10-CM

## 2014-02-07 DIAGNOSIS — J301 Allergic rhinitis due to pollen: Secondary | ICD-10-CM

## 2014-02-07 DIAGNOSIS — E039 Hypothyroidism, unspecified: Secondary | ICD-10-CM

## 2014-02-07 DIAGNOSIS — F410 Panic disorder [episodic paroxysmal anxiety] without agoraphobia: Secondary | ICD-10-CM

## 2014-02-07 DIAGNOSIS — Z0000 Encounter for general adult medical examination without abnormal findings: Secondary | ICD-10-CM

## 2014-02-07 MED ORDER — ACETIC ACID 2 % OT SOLN: 4.0000 [drp] | Freq: Three times a day (TID) | OTIC | Status: DC

## 2014-02-07 MED ORDER — LEVOTHYROXINE SODIUM 75 MCG PO TABS: 75.0000 ug | ORAL_TABLET | Freq: Every day | ORAL | Status: DC

## 2014-02-07 MED ORDER — CETIRIZINE HCL 10 MG PO TABS: 10.0000 mg | ORAL_TABLET | Freq: Every day | ORAL | Status: DC

## 2014-02-07 MED ORDER — HYDROCHLOROTHIAZIDE 12.5 MG PO CAPS: 12.5000 mg | ORAL_CAPSULE | Freq: Every day | ORAL | Status: DC

## 2014-02-07 MED ORDER — PAROXETINE HCL 20 MG PO TABS: 20.0000 mg | ORAL_TABLET | Freq: Every day | ORAL | Status: DC

## 2014-02-07 MED ORDER — SALINE SPRAY 0.65 % NA SOLN: 1.0000 | Freq: Four times a day (QID) | NASAL | Status: DC

## 2014-02-07 NOTE — Patient Instructions (Addendum)
-  Start taking Zyrtec 10mg  daily for allergies.  -You may use the acetic acid ear drops if you continue to have itching in the ear canal.  -Use saline nasal spray, 1-2 spray in each nostril 4 times per day for nasal congestion.  -You may take Tylenol 500mg  1-2 tablets 4 times per day as needed for headache.  -Follow up with Bonna Gains for the [facility] card application--you will need this first before we can refer you for your colonoscopy and mammogram.  -Follow up with [place] if your symptoms do not improve and as soon as you have the [facility] card.

## 2014-02-09 ENCOUNTER — Encounter: Payer: Self-pay | Admitting: Internal Medicine

## 2014-02-09 DIAGNOSIS — R51 Headache: Secondary | ICD-10-CM | POA: Insufficient documentation

## 2014-02-09 DIAGNOSIS — Z0000 Encounter for general adult medical examination without abnormal findings: Secondary | ICD-10-CM | POA: Insufficient documentation

## 2014-02-09 DIAGNOSIS — H6093 Unspecified otitis externa, bilateral: Secondary | ICD-10-CM | POA: Insufficient documentation

## 2014-02-09 DIAGNOSIS — I10 Essential (primary) hypertension: Secondary | ICD-10-CM

## 2014-02-09 DIAGNOSIS — R519 Headache, unspecified: Secondary | ICD-10-CM | POA: Insufficient documentation

## 2014-02-09 HISTORY — DX: Essential (primary) hypertension: I10

## 2014-02-09 HISTORY — DX: Encounter for general adult medical examination without abnormal findings: Z00.00

## 2014-02-09 NOTE — Assessment & Plan Note (Signed)
Stable. Refilled Rx for Paxil 20mg  daily

## 2014-02-09 NOTE — Progress Notes (Signed)
   Subjective:    Patient ID: [name], female    DOB: 08-02-1961, 53 y.o.   MRN: [id number]  Headache  Associated symptoms include ear pain and sinus pressure. Pertinent negatives include no abdominal pain, back pain, coughing, dizziness, fever, hearing loss, rhinorrhea or sore throat.   [name] is a 53 yr old woman with PMH significant for panic disorder, hypothyroidism, anf HTN, who presents for evaluation of headache and nasal congestion x3 days. Her headache is described as constant pressure above her eyes, has not improved with Tylenol or Advil. She also feels pressure in her nose. She requests refills for her medications.    Review of Systems  Constitutional: Negative for fever, chills and diaphoresis.  HENT: Positive for ear pain and sinus pressure. Negative for facial swelling, hearing loss, postnasal drip, rhinorrhea, sneezing and sore throat.        Bilateral ear itching  Eyes: Negative for discharge and itching.  Respiratory: Negative for cough and shortness of breath.   Cardiovascular: Negative for chest pain.  Gastrointestinal: Negative for abdominal pain.  Genitourinary: Negative for dysuria.  Musculoskeletal: Negative for back pain.  Skin: Negative for rash.  Neurological: Positive for headaches. Negative for dizziness and light-headedness.  Psychiatric/Behavioral: Negative for agitation.       Objective:   Physical Exam  Nursing note and vitals reviewed. Constitutional: She is oriented to person, place, and time. She appears well-developed and well-nourished. No distress.  Morbidly obese  HENT:  Mouth/Throat: Oropharynx is clear and moist. No oropharyngeal exudate.  Frontal and maxillary sinus tenderness. Pale nasal turbinates bilaterally with no discharge.  Bilateral TM normal with no edema/erythema Bilateral ear canal with mild erythema with no exudate, no signs of trauma, no bleeding.    Eyes: Conjunctivae are normal. Right eye exhibits no  discharge. Left eye exhibits no discharge. No scleral icterus.  Neck: Normal range of motion. Neck supple.  Cardiovascular: Normal rate and regular rhythm.   Pulmonary/Chest: Effort normal. No respiratory distress. She has no wheezes. She has no rales.  Musculoskeletal: She exhibits no edema and no tenderness.  Neurological: She is alert and oriented to person, place, and time.  Skin: Skin is warm and dry. No rash noted. She is not diaphoretic. No erythema.  Psychiatric: She has a normal mood and affect. Her behavior is normal.          Assessment & Plan:

## 2014-02-09 NOTE — Assessment & Plan Note (Signed)
She needs colonoscopy and mammogram but needs to apply for the [facility] card first.

## 2014-02-09 NOTE — Assessment & Plan Note (Signed)
Her symptoms, including her HA are most likely due to seasonal allergic rhinitis.  Rx Zyrtec 10mg  daily Saline nasal spray 4 times daily

## 2014-02-09 NOTE — Assessment & Plan Note (Signed)
Stable. Refilled synthroid 75 mcg

## 2014-02-09 NOTE — Assessment & Plan Note (Signed)
She has itching of both ear with normal TM. Will treat for otitis externa.  Acetic acid 2% otic solution 4 drops 3 times daily.

## 2014-02-09 NOTE — Assessment & Plan Note (Signed)
BP Readings from Last 3 Encounters:  02/07/14 133/80  11/16/13 130/80  06/11/13 150/77    Lab Results  Component Value Date   NA 141 05/18/2013   K 3.7 05/18/2013   CREATININE 1.00 05/18/2013    Assessment: Blood pressure control:  Controlled Progress toward BP goal:   at goal Comments: States that she has been taking HCTZ 12.5mg . SPB elevated to 150s on multiple occasions last year but well controlled during this visit per pt's request--she thought she was told by her Pharmacy that she did not have refills.   Plan: Medications:  continue current medications Educational resources provided:   Self management tools provided:   Other plans: Follow up in 3 months for BP recheck.

## 2014-02-09 NOTE — Assessment & Plan Note (Signed)
No signs of meningismus on physical exam, no fever/chills. Describes a frontal HA with sinus pressure but no rhinorrhea. Likely due to allergic rhinitis.  Pt to continue Tylenol PRN (up to 4g in 24 hours)

## 2014-02-13 ENCOUNTER — Ambulatory Visit: Payer: Self-pay

## 2014-02-26 ENCOUNTER — Telehealth: Payer: Self-pay | Admitting: *Deleted

## 2014-02-26 NOTE — Telephone Encounter (Signed)
rec'd email from pt, she stated her left leg is swelling, she is becoming short of breath, breaking out in a sweat and going to void 3 to 4 times nightly, triage tried to call her, left message at 1 phone and the other seemed to be disconnected

## 2014-02-26 NOTE — Telephone Encounter (Signed)
i tried her ph again and she answered this time, said she got the message but hadn't had time to call back. She was not noted to be in distress. appt made for tues am 1015 w/ dr Eyvonne Mechanic. She was cautioned if she becomes worse or has episode of short of breath to call 911 or go to ED, she was agreeable

## 2014-02-27 ENCOUNTER — Ambulatory Visit: Payer: Self-pay | Admitting: Internal Medicine

## 2014-02-27 NOTE — Telephone Encounter (Signed)
Agree with the plan. Thanks  Nicoletta Dress

## 2014-04-06 ENCOUNTER — Emergency Department (HOSPITAL_COMMUNITY)
Admission: EM | Admit: 2014-04-06 | Discharge: 2014-04-06 | Disposition: A | Discharge status: Home / Self Care | Payer: Self-pay | Attending: Emergency Medicine | Admitting: Emergency Medicine

## 2014-04-06 ENCOUNTER — Encounter (HOSPITAL_COMMUNITY): Payer: Self-pay | Admitting: Emergency Medicine

## 2014-04-06 ENCOUNTER — Emergency Department (HOSPITAL_COMMUNITY): Payer: Self-pay

## 2014-04-06 DIAGNOSIS — I10 Essential (primary) hypertension: Secondary | ICD-10-CM | POA: Insufficient documentation

## 2014-04-06 DIAGNOSIS — Z79899 Other long term (current) drug therapy: Secondary | ICD-10-CM | POA: Insufficient documentation

## 2014-04-06 DIAGNOSIS — R0602 Shortness of breath: Secondary | ICD-10-CM | POA: Insufficient documentation

## 2014-04-06 DIAGNOSIS — R609 Edema, unspecified: Secondary | ICD-10-CM | POA: Insufficient documentation

## 2014-04-06 DIAGNOSIS — E669 Obesity, unspecified: Secondary | ICD-10-CM | POA: Insufficient documentation

## 2014-04-06 DIAGNOSIS — Z8719 Personal history of other diseases of the digestive system: Secondary | ICD-10-CM | POA: Insufficient documentation

## 2014-04-06 DIAGNOSIS — F410 Panic disorder [episodic paroxysmal anxiety] without agoraphobia: Secondary | ICD-10-CM | POA: Insufficient documentation

## 2014-04-06 DIAGNOSIS — Z862 Personal history of diseases of the blood and blood-forming organs and certain disorders involving the immune mechanism: Secondary | ICD-10-CM | POA: Insufficient documentation

## 2014-04-06 DIAGNOSIS — E039 Hypothyroidism, unspecified: Secondary | ICD-10-CM | POA: Insufficient documentation

## 2014-04-06 DIAGNOSIS — I209 Angina pectoris, unspecified: Secondary | ICD-10-CM | POA: Insufficient documentation

## 2014-04-06 LAB — CBC
HEMATOCRIT: 40.8 % (ref 36.0–46.0)
Hemoglobin: 13.7 g/dL (ref 12.0–15.0)
MCH: 30.5 pg (ref 26.0–34.0)
MCHC: 33.6 g/dL (ref 30.0–36.0)
MCV: 90.9 fL (ref 78.0–100.0)
PLATELETS: 250 10*3/uL (ref 150–400)
RBC: 4.49 MIL/uL (ref 3.87–5.11)
RDW: 13.5 % (ref 11.5–15.5)
WBC: 9.7 10*3/uL (ref 4.0–10.5)

## 2014-04-06 LAB — I-STAT TROPONIN, ED
TROPONIN I, POC: 0 ng/mL (ref 0.00–0.08)
Troponin i, poc: 0 ng/mL (ref 0.00–0.08)

## 2014-04-06 LAB — BASIC METABOLIC PANEL
BUN: 19 mg/dL (ref 6–23)
CHLORIDE: 101 meq/L (ref 96–112)
CO2: 22 mEq/L (ref 19–32)
CREATININE: 0.95 mg/dL (ref 0.50–1.10)
Calcium: 9.5 mg/dL (ref 8.4–10.5)
GFR, EST AFRICAN AMERICAN: 80 mL/min — AB (ref >90)
GFR, EST NON AFRICAN AMERICAN: 69 mL/min — AB (ref >90)
Glucose, Bld: 114 mg/dL — ABNORMAL HIGH (ref 70–99)
POTASSIUM: 4.3 meq/L (ref 3.7–5.3)
Sodium: 139 mEq/L (ref 137–147)

## 2014-04-06 LAB — PRO B NATRIURETIC PEPTIDE: Pro B Natriuretic peptide (BNP): 62.1 pg/mL (ref 0–125)

## 2014-04-06 LAB — D-DIMER, QUANTITATIVE (NOT AT ARMC): D DIMER QUANT: 0.55 ug{FEU}/mL — AB (ref 0.00–0.48)

## 2014-04-06 MED ORDER — IOHEXOL 350 MG/ML SOLN
80.0000 mL | Freq: Once | INTRAVENOUS | Status: AC | PRN
  Administered 2014-04-06: 80 mL via INTRAVENOUS

## 2014-04-06 NOTE — ED Notes (Signed)
IV team returned page and will come start IV.

## 2014-04-06 NOTE — ED Provider Notes (Signed)
CSN: [id number]     Arrival date & time 04/06/14  1013 History   First MD Initiated Contact with Patient 04/06/14 1116     Chief Complaint  Patient presents with  . Shortness of Breath     (Consider location/radiation/quality/duration/timing/severity/associated sxs/prior Treatment) HPI Comments: Patient is a 53 year old female past medical history significant for hypertension, obesity, hypertriglyceridemia presenting to the emergency department for gradually worsening dyspnea on exertion with intermittent episodes of left leg swelling. Patient states her symptoms have been more noticeable over the last few weeks, significantly worse over the last 4 days. She states she stimulable to walk around her kitchen and do the dishes without shortness of breath or fatigue, now states she has to sit down due to her shortness of breath. Patient states her leg swelling is more noticeable in the evening after being up and walking around. Patient also endorses 2 days ago she had a five-minute episode of central chest pressure with radiation to back, the pain has resolved and not returned. No orthopnea or PND. Denies any chest pain currently or leg swelling currently. Denies any fevers, nausea, vomiting, abdominal pain. No recent surgeries or immobilization. No history of DVTs or PEs. No exogenous estrogen use.   Past Medical History  Diagnosis Date  . Hypertriglyceridemia     164 - 2/11  . Panic disorder 11/05  . Obesity     250 lbs 8/11  . Atypical chest pain     Negative cardiolite at [facility] cards 11/07  . Hypothyroidism   . Insomnia   . Allergic rhinitis   . Elevated BP   . Anginal pain   . Shortness of breath 05/18/2012    "at rest; lying down; w/exertion"  . Perirectal abscess 11/14/2011  . CHOLECYSTECTOMY, HX OF     Annotation: 2000 Qualifier: Diagnosis of  By: Eddie Dibbles MD, Bhakti    . History of blood transfusion 03/2008    Requried 2u prbc; menometrorrhagia, uterine fibroids.  . Anemia, iron  deficiency    Past Surgical History  Procedure Laterality Date  . Incision and drainage perirectal abscess  11/14/2011    Procedure: IRRIGATION AND DEBRIDEMENT PERIRECTAL ABSCESS;  Surgeon: Zenovia Jarred, MD;  Location: [facility];  Service: General;  Laterality: N/A;  . Abdominal hysterectomy  04/2007    total abdominal hysterectomy ( cervix) with bilateral salpingo-oopherectomy  . Dilation and curettage of uterus  1987  . Incision and drainage abscess  11/02/2012    Procedure: INCISION AND DRAINAGE ABSCESS;  Surgeon: Odis Hollingshead, MD;  Location: [facility];  Service: General;  Laterality: N/A;  I&D anorectal abscess  . Cholecystectomy  07/30/1999    Diagnosis of  By: Eddie Dibbles MD, Bhakti    . Nerve, tendon and artery repair Left 06/11/2013    Procedure: EXPLORATION AND REPAIR LEFT FOREARM;  Surgeon: Schuyler Amor, MD;  Location: [facility];  Service: Orthopedics;  Laterality: Left;   Family History  Problem Relation Age of Onset  . Heart attack Mother     at the age of 26's  . Lung cancer Mother     metestatic, + smoker  . Aneurysm Mother   . Hyperlipidemia Mother   . Hypertension Mother   . Hypertension Father   . Heart attack Maternal Grandmother     at the age of 32's   History  Substance Use Topics  . Smoking status: Never Smoker   . Smokeless tobacco: Never Used  . Alcohol Use: Yes  Comment: 05/18/2012 "have a taste of a drink maybe once/yr"   OB History   Grav Para Term Preterm Abortions TAB SAB Ect Mult Living                 Review of Systems  Respiratory: Positive for shortness of breath.   Cardiovascular: Positive for leg swelling.  All other systems reviewed and are negative.     Allergies  Codeine; Doxycycline; Hydrocodone; Oxycodone; and Sulfamethoxazole-trimethoprim  Home Medications   Prior to Admission medications   Medication Sig Start Date End Date Taking? Authorizing Provider  hydrochlorothiazide (MICROZIDE) 12.5 MG capsule Take 1 capsule (12.5 mg  total) by mouth daily. 02/07/14  Yes Blain Pais, MD  levothyroxine (SYNTHROID) 75 MCG tablet Take 1 tablet (75 mcg total) by mouth daily. 02/07/14 02/07/15 Yes Blain Pais, MD  PARoxetine (PAXIL) 20 MG tablet Take 1 tablet (20 mg total) by mouth daily. 02/07/14  Yes Blain Pais, MD   BP 124/62  Pulse 65  Temp(Src) 98.3 F (36.8 C) (Oral)  Resp 24  SpO2 100%  LMP 06/17/2007 Physical Exam  Nursing note and vitals reviewed. Constitutional: She is oriented to person, place, and time. She appears well-developed and well-nourished. No distress.  Exam limited by body habitus  HENT:  Head: Normocephalic and atraumatic.  Right Ear: External ear normal.  Left Ear: External ear normal.  Nose: Nose normal.  Mouth/Throat: Oropharynx is clear and moist.  Eyes: Conjunctivae are normal.  Neck: Neck supple.  Cardiovascular: Normal rate, regular rhythm, normal heart sounds and intact distal pulses.   Pulmonary/Chest: Effort normal and breath sounds normal. No respiratory distress. She has no wheezes. She has no rales. She exhibits no tenderness.  Abdominal: Soft. There is no tenderness.  Musculoskeletal: Normal range of motion. She exhibits no edema and no tenderness.  Negative Homan's signs. Compartments are soft.   Lymphadenopathy:    She has no cervical adenopathy.  Neurological: She is alert and oriented to person, place, and time.  Skin: Skin is warm and dry. She is not diaphoretic.    ED Course  Procedures (including critical care time) Medications - No data to display  Labs Review Labs Reviewed  BASIC METABOLIC PANEL - Abnormal; Notable for the following:    Glucose, Bld 114 (*)    GFR calc non Af Amer 69 (*)    GFR calc Af Amer 80 (*)    All other components within normal limits  D-DIMER, QUANTITATIVE - Abnormal; Notable for the following:    D-Dimer, Quant 0.55 (*)    All other components within normal limits  CBC  PRO B NATRIURETIC PEPTIDE  I-STAT  TROPOININ, ED  Randolm Idol, ED    Imaging Review Dg Chest 2 View  04/06/2014   CLINICAL DATA:  Shortness of breath.  EXAM: CHEST  2 VIEW  COMPARISON:  06/09/2013 and 11/02/2012 as well as 02/21/2012  FINDINGS: Lungs are adequately inflated and otherwise clear. Cardiomediastinal silhouette and remainder of the exam is unchanged.  IMPRESSION: No active cardiopulmonary disease.   Electronically Signed   By: Marin Olp M.D.   On: 04/06/2014 11:25     EKG Interpretation   Date/Time:  Friday April 06 2014 10:20:52 EDT Ventricular Rate:  78 PR Interval:  152 QRS Duration: 80 QT Interval:  362 QTC Calculation: 412 R Axis:     Text Interpretation:  Normal sinus rhythm Normal ECG No significant change  was found Confirmed by Wyvonnia Dusky  MD, STEPHEN [phone])  on 04/06/2014 11:42:48  AM      MDM   Final diagnoses:  Shortness of breath    Filed Vitals:   04/06/14 1400  BP:   Pulse: 65  Temp:   Resp:    Afebrile, NAD, non-toxic appearing, AAOx4.   Physical exam the/A. unremarkable. Cardiac and pulmonary exam benign. No lower extremity edema. Neurovascularly intact. Normal sensation. Patient is not hypoxic and emergency department, able to ambulate maintaining oxygen saturations above 90%. Delta troponin are negative. EKG is unremarkable. Chest x-ray negative. BNP within normal limits. D-dimer was elevated, discussed these results with the patient who is agreeable to a CT angios chest for PE rule out. Plan is to likely discharge patient home pending no CTA findings with cardiology followup as an outpatient along with PCP followup.  Signed out to Dr. Ambrose Finland pending CT chest. Patient d/w with Dr. Wyvonnia Dusky, agrees with plan.     Harlow Mares, PA-C 04/06/14 1647

## 2014-04-06 NOTE — ED Provider Notes (Signed)
Care of pt taken over from Baron Sane at about 4:19 PM Please refer to their note for further history and plan In brief, patient is a 53 y.o. y/o female who p/w DOE. 4 days. Worsening. Fatigued with doing dishes. More than baseline. LLE edema swelling worse at end of day.  No SOB or edema in ED.  5 minutes of CP 2 days ago. None since.  O2 sats fine with walking.  Dimer positive.   Plan is to f/u CTA chest to eval for PE.  F/u cardiology within 1 week if unremarkable CT scan.   CT negative.  Delta trop negative. Patient feeling better. Not hypoxic. No edema on exam. Lungs clear.  Do not suspect ACS, dissection, CHF, or other emergent pathology. To f/u closely with pcp.  Patient discharged home. Return precautions given. To follow up with pcp. patient in agreement with plan.    1. Shortness of breath      Bonnita Hollow, MD 04/07/14 [phone]

## 2014-04-06 NOTE — ED Notes (Signed)
Per [place], [place] - pt is able to eat and drink.

## 2014-04-06 NOTE — ED Provider Notes (Signed)
Medical screening examination/treatment/procedure(s) were conducted as a shared visit with non-physician practitioner(s) and myself.  I personally evaluated the patient during the encounter.  DOE x 4 days, no chest pain, some swelling in her ankles.  No orthopnea or PND.   No cough or fever. Stress test 2007 negative. EKG nsr. No edema.   Delta troponin negative, EKG unchanged. Recommend outpatient followup for stress test.   EKG Interpretation   Date/Time:  Friday April 06 2014 10:20:52 EDT Ventricular Rate:  78 PR Interval:  152 QRS Duration: 80 QT Interval:  362 QTC Calculation: 412 R Axis:     Text Interpretation:  Normal sinus rhythm Normal ECG No significant change  was found Confirmed by Wyvonnia Dusky  MD, STEPHEN ([id number]) on 04/06/2014 11:42:48  AM       Ezequiel Essex, MD 04/06/14 1607

## 2014-04-06 NOTE — ED Notes (Signed)
Pt c/o sob only with exertion, denies cp. sts she is also having left lower leg swelling X 4 days. sts she takes a fluid pill every day. Denies recent travel/sx. Nad, skin warm and dry, resp e/u.

## 2014-04-06 NOTE — ED Notes (Signed)
Paged IV team 

## 2014-04-06 NOTE — ED Notes (Signed)
While ambulating initially pt's O2 sats  97-100% on room air, then O2 sats dropped to 93%. Pt sts she was starting to feel sob but was able to ambulate back to room with no difficulties.pt non-diaphoretic. Denies cp.

## 2014-04-06 NOTE — ED Notes (Signed)
Phlebotomy at bedside.

## 2014-04-06 NOTE — ED Notes (Signed)
Pt returned from radiology. Placed back on monitor.  

## 2014-04-06 NOTE — ED Notes (Addendum)
Pt. Having sob for 4 days with lt. Leg swelling and fatigue.  She also report intermittent chest pain, but denies any chest pain presently.   Pt.is in NAD,  Skin is p/w/d, Pt. Denies any cold symptoms, but reports that she will be doing dishes and will have to sit down due sob and sweating.

## 2014-04-06 NOTE — Discharge Instructions (Signed)

## 2014-04-07 NOTE — ED Provider Notes (Signed)
I saw and evaluated the patient, reviewed the resident's note and I agree with the findings and plan.   EKG Interpretation   Date/Time:  Friday April 06 2014 10:20:52 EDT Ventricular Rate:  78 PR Interval:  152 QRS Duration: 80 QT Interval:  362 QTC Calculation: 412 R Axis:     Text Interpretation:  Normal sinus rhythm Normal ECG No significant change  was found Confirmed by Wyvonnia Dusky  MD, STEPHEN ([id number]) on 04/06/2014 11:42:48  AM      Care assumed at sign out pending CT angio. CT showed no PE. Stable for d/c.   Wandra Arthurs, MD 04/07/14 1052

## 2014-09-27 ENCOUNTER — Ambulatory Visit (INDEPENDENT_AMBULATORY_CARE_PROVIDER_SITE_OTHER): Payer: Self-pay | Admitting: Internal Medicine

## 2014-09-27 ENCOUNTER — Encounter: Payer: Self-pay | Admitting: Internal Medicine

## 2014-09-27 VITALS — BP 158/102 | HR 66 | Temp 98.3°F | Wt 279.9 lb

## 2014-09-27 DIAGNOSIS — J029 Acute pharyngitis, unspecified: Secondary | ICD-10-CM

## 2014-09-27 MED ORDER — AMOXICILLIN 500 MG PO TABS: 500.0000 mg | ORAL_TABLET | Freq: Two times a day (BID) | ORAL | Status: DC

## 2014-09-27 NOTE — Progress Notes (Signed)
Patient ID: [name], female   DOB: 03/13/1961, 53 y.o.   MRN: [id number]   Subjective:   Patient ID: [name] female   DOB: March 13, 1961 53 y.o.   MRN: [id number]  HPI: [name] is a 53 y.o. with PMH listed blow. Presented with ear pain, sore throat and chills all started yesterday. Hx of sick contacts, grandaduaghter was throwing up yesterday.  Ear pain- started yesterday, she felt something coming out of thet ear. She has had ear infections coming out of that ear before, but thinks her ear drum is intact. Pt also endorses pain in the right side of her face, she eels like ist is coming form her ear.  Sorethroat- it hurts to swallow, no odynophagia, no SOB, no cough. Pt says she threw up once yesterday, Pt also endorses 2- 3 loose stools yesterday, but none today, no blood in vomitus of stool, No abdominal pain.      Past Medical History  Diagnosis Date  . Hypertriglyceridemia     164 - 2/11  . Panic disorder 11/05  . Obesity     250 lbs 8/11  . Atypical chest pain     Negative cardiolite at [facility] cards 11/07  . Hypothyroidism   . Insomnia   . Allergic rhinitis   . Elevated BP   . Anginal pain   . Shortness of breath 05/18/2012    "at rest; lying down; w/exertion"  . Perirectal abscess 11/14/2011  . CHOLECYSTECTOMY, HX OF     Annotation: 2000 Qualifier: Diagnosis of  By: Eddie Dibbles MD, Bhakti    . History of blood transfusion 03/2008    Requried 2u prbc; menometrorrhagia, uterine fibroids.  . Anemia, iron deficiency    Current Outpatient Prescriptions  Medication Sig Dispense Refill  . hydrochlorothiazide (MICROZIDE) 12.5 MG capsule Take 1 capsule (12.5 mg total) by mouth daily. 30 capsule 11  . levothyroxine (SYNTHROID) 75 MCG tablet Take 1 tablet (75 mcg total) by mouth daily. 30 tablet 11  . PARoxetine (PAXIL) 20 MG tablet Take 1 tablet (20 mg total) by mouth daily. 90 tablet 4   No current facility-administered medications for this visit.   Family History    Problem Relation Age of Onset  . Heart attack Mother     at the age of 22's  . Lung cancer Mother     metestatic, + smoker  . Aneurysm Mother   . Hyperlipidemia Mother   . Hypertension Mother   . Hypertension Father   . Heart attack Maternal Grandmother     at the age of 14's   History   Social History  . Marital Status: Married    Spouse Name: N/A    Number of Children: N/A  . Years of Education: N/A   Social History Main Topics  . Smoking status: Never Smoker   . Smokeless tobacco: Never Used  . Alcohol Use: Yes     Comment: 05/18/2012 "have a taste of a drink maybe once/yr"  . Drug Use: No  . Sexual Activity: Not Currently   Other Topics Concern  . None   Social History Narrative   Currently unemployed. Starting college in Aug for accounting.  Married, has 2 children lives with her youngest son.     Review of Systems: CONSTITUTIONAL- No Fever, weightloss, night sweat or change in appetite. SKIN- No Rash, colour changes or itching. HEAD- No Headache or dizziness. EYES- No Vision loss, pain, blurred vision. RESPIRATORY- No Cough or SOB. CARDIAC-  No Palpitations, DOE, PND or chest pain. GI- No nausea, vomiting, diarrhoea, constipation, abd pain. URINARY- No Frequency, urgency, straining or dysuria. NEUROLOGIC- No Numbness, syncope, seizures or burning.  Objective:  Physical Exam: Filed Vitals:   09/27/14 1043  BP: 158/102  Pulse: 66  Temp: 98.3 F (36.8 C)  TempSrc: Oral  Weight: 279 lb 14.4 oz (126.962 kg)  SpO2: 97%   GENERAL- alert, co-operative, appears as stated age, not in any distress. HEENT- Atraumatic, normocephalic, PERRL, EOMI, wearing dentures, mild pharyngeal erythema, no exudate, no asymmetry, tender cervical lymphadenopathy, tenderness on pulling on tragus and pinna, mild erythema in ear canal- distally, ear drum no visualized, some wax present in  Ear,  neck supple. CARDIAC- RRR, no murmurs, rubs or gallops. RESP- Moving equal volumes of  air, and clear to auscultation bilaterally, no wheezes or crackles. ABDOMEN- Soft, obese, nontender, no guarding or rebound, no palpable masses or organomegaly, bowel sounds present. BACK- Normal curvature of the spine, No tenderness along the vertebrae, no CVA tenderness. NEURO- No obvious Cr N abnormality, strenght upper and lower extremities- 5/5, Sensation intact- globally, DTRs- Normal, finger to nose test normal bilat, rapid alternating movement- intact, Gait- Normal. EXTREMITIES- pulse 2+, symmetric, no pedal edema. SKIN- Warm, dry, No rash or lesion. PSYCH- Normal mood and affect, appropriate thought content and speech.  Assessment & Plan:   The patient's case and plan of care was discussed with attending physician, Dr. Daryll Drown.  Please see problem based charting for assessment and plan.

## 2014-09-27 NOTE — Assessment & Plan Note (Addendum)
Pt with sore throat today, with ear pain. Ear examination- tenderness on pulling the tragus, with some erythema present distally in the ear canal. No dischareg appreciated. Tender lymphadenopthay on the right side. Pt also with chills. Differentials- Pharyngitis with Ortitis media, as pt is also complaining of pressure in her ears. On exam ear drum could not be visualized, some wax present in ear, doubt rupture. Considering pain on pulling on the tragus with erythema on exam- there is concern for otitis externa also, but will treat with Oral Amoxicillin for Orthitis media and possible Strep Pharyngitis. Did not prescribe topical ear drops in case of ear perforation.  Plan- Amoxicillin- 500mg  BID for 10 days. - Appointment in a week , if patient doesn't feel better, might be able to visualize ear drum, if persistent pain on pulling on the tragus and erythema, with visualized intact ear drum, can prescribe- Ofloxacin ear drops. - Swab for group A direct.

## 2014-09-27 NOTE — Patient Instructions (Signed)
General Instructions: We will be prescribing an antibiotic for you, Called amoxicillin. Take one tablet twice a day for 10 days.  We will give you an appointment in a week, if you feel better, then you could call and cancel it.  Please try to bring all your medicines next time. This will help [place] keep you safe from mistakes.

## 2014-09-28 LAB — RAPID STREP SCREEN (MED CTR MEBANE ONLY): STREPTOCOCCUS, GROUP A SCREEN (DIRECT): POSITIVE — AB

## 2014-10-01 NOTE — Progress Notes (Signed)
Internal Medicine Clinic Attending  Case discussed with Dr. Emokpae soon after the resident saw the patient.  We reviewed the resident's history and exam and pertinent patient test results.  I agree with the assessment, diagnosis, and plan of care documented in the resident's note. 

## 2014-10-08 ENCOUNTER — Ambulatory Visit: Payer: Self-pay | Admitting: Internal Medicine

## 2014-10-16 ENCOUNTER — Encounter: Payer: Self-pay | Admitting: Internal Medicine

## 2014-10-16 ENCOUNTER — Ambulatory Visit: Payer: Self-pay | Admitting: Internal Medicine

## 2014-10-20 ENCOUNTER — Emergency Department (HOSPITAL_COMMUNITY)
Admission: EM | Admit: 2014-10-20 | Discharge: 2014-10-20 | Disposition: A | Discharge status: Home / Self Care | Payer: Self-pay | Attending: Emergency Medicine | Admitting: Emergency Medicine

## 2014-10-20 ENCOUNTER — Encounter (HOSPITAL_COMMUNITY): Payer: Self-pay | Admitting: *Deleted

## 2014-10-20 DIAGNOSIS — F410 Panic disorder [episodic paroxysmal anxiety] without agoraphobia: Secondary | ICD-10-CM | POA: Insufficient documentation

## 2014-10-20 DIAGNOSIS — E669 Obesity, unspecified: Secondary | ICD-10-CM | POA: Insufficient documentation

## 2014-10-20 DIAGNOSIS — E039 Hypothyroidism, unspecified: Secondary | ICD-10-CM | POA: Insufficient documentation

## 2014-10-20 DIAGNOSIS — K644 Residual hemorrhoidal skin tags: Secondary | ICD-10-CM | POA: Insufficient documentation

## 2014-10-20 DIAGNOSIS — Z79899 Other long term (current) drug therapy: Secondary | ICD-10-CM | POA: Insufficient documentation

## 2014-10-20 DIAGNOSIS — I209 Angina pectoris, unspecified: Secondary | ICD-10-CM | POA: Insufficient documentation

## 2014-10-20 DIAGNOSIS — Z862 Personal history of diseases of the blood and blood-forming organs and certain disorders involving the immune mechanism: Secondary | ICD-10-CM | POA: Insufficient documentation

## 2014-10-20 MED ORDER — POLYETHYLENE GLYCOL 3350 17 GM/SCOOP PO POWD: 1.0000 | Freq: Once | ORAL | Status: DC

## 2014-10-20 MED ORDER — HYDROCORTISONE 2.5 % RE CREA: TOPICAL_CREAM | RECTAL | Status: DC

## 2014-10-20 NOTE — ED Notes (Signed)
Pt. Thought she had hemroid and was self treating. Pt. States nothing is working and thinks its a fissure

## 2014-10-20 NOTE — Discharge Instructions (Signed)
Use anusol as directed. Take Miralax twice per day to soften stools. Use donut cushion for comfort while sitting. Refer to attached documents for more information.

## 2014-10-20 NOTE — ED Notes (Signed)
Donut cushion given. Pt. Refused wheelchair and left with all belongings

## 2014-10-20 NOTE — ED Provider Notes (Signed)
CSN: [id number]     Arrival date & time 10/20/14  0429 History   First MD Initiated Contact with Patient 10/20/14 [phone]     Chief Complaint  Patient presents with  . Anal Fissure     (Consider location/radiation/quality/duration/timing/severity/associated sxs/prior Treatment) HPI Comments: Patient is a 54 year old female who presents with rectal pain that started 2 days ago. Pain started gradually and progressively worsened since the onset. The pain is sharp and severe without radiation. Palpation, sitting, and bowel movements make the pain worse. No alleviating factors. Patient has tried preparation H cream without relief. No other associated symptoms    Past Medical History  Diagnosis Date  . Hypertriglyceridemia     164 - 2/11  . Panic disorder 11/05  . Obesity     250 lbs 8/11  . Atypical chest pain     Negative cardiolite at [facility] cards 11/07  . Hypothyroidism   . Insomnia   . Allergic rhinitis   . Elevated BP   . Anginal pain   . Shortness of breath 05/18/2012    "at rest; lying down; w/exertion"  . Perirectal abscess 11/14/2011  . CHOLECYSTECTOMY, HX OF     Annotation: 2000 Qualifier: Diagnosis of  By: Eddie Dibbles MD, Bhakti    . History of blood transfusion 03/2008    Requried 2u prbc; menometrorrhagia, uterine fibroids.  . Anemia, iron deficiency    Past Surgical History  Procedure Laterality Date  . Incision and drainage perirectal abscess  11/14/2011    Procedure: IRRIGATION AND DEBRIDEMENT PERIRECTAL ABSCESS;  Surgeon: Zenovia Jarred, MD;  Location: [place];  Service: General;  Laterality: N/A;  . Abdominal hysterectomy  04/2007    total abdominal hysterectomy ( cervix) with bilateral salpingo-oopherectomy  . Dilation and curettage of uterus  1987  . Incision and drainage abscess  11/02/2012    Procedure: INCISION AND DRAINAGE ABSCESS;  Surgeon: Odis Hollingshead, MD;  Location: [place];  Service: General;  Laterality: N/A;  I&D anorectal abscess  . Cholecystectomy  07/30/1999     Diagnosis of  By: Eddie Dibbles MD, Bhakti    . Nerve, tendon and artery repair Left 06/11/2013    Procedure: EXPLORATION AND REPAIR LEFT FOREARM;  Surgeon: Schuyler Amor, MD;  Location: [place];  Service: Orthopedics;  Laterality: Left;   Family History  Problem Relation Age of Onset  . Heart attack Mother     at the age of 70's  . Lung cancer Mother     metestatic, + smoker  . Aneurysm Mother   . Hyperlipidemia Mother   . Hypertension Mother   . Hypertension Father   . Heart attack Maternal Grandmother     at the age of 57's   History  Substance Use Topics  . Smoking status: Never Smoker   . Smokeless tobacco: Never Used  . Alcohol Use: Yes     Comment: 05/18/2012 "have a taste of a drink maybe once/yr"   OB History    No data available     Review of Systems  Constitutional: Negative for fever, chills and fatigue.  HENT: Negative for trouble swallowing.   Eyes: Negative for visual disturbance.  Respiratory: Negative for shortness of breath.   Cardiovascular: Negative for chest pain and palpitations.  Gastrointestinal: Positive for rectal pain. Negative for nausea, vomiting, abdominal pain and diarrhea.  Genitourinary: Negative for dysuria and difficulty urinating.  Musculoskeletal: Negative for arthralgias and neck pain.  Skin: Negative for color change.  Neurological: Negative  for dizziness and weakness.  Psychiatric/Behavioral: Negative for dysphoric mood.      Allergies  Codeine; Doxycycline; Hydrocodone; Oxycodone; and Sulfamethoxazole-trimethoprim  Home Medications   Prior to Admission medications   Medication Sig Start Date End Date Taking? Authorizing Provider  acetaminophen (TYLENOL) 500 MG tablet Take 1,000 mg by mouth every 6 (six) hours as needed for mild pain.   Yes Historical Provider, MD  hydrochlorothiazide (MICROZIDE) 12.5 MG capsule Take 1 capsule (12.5 mg total) by mouth daily. 02/07/14  Yes Blain Pais, MD  levothyroxine (SYNTHROID) 75 MCG  tablet Take 1 tablet (75 mcg total) by mouth daily. 02/07/14 02/07/15 Yes Blain Pais, MD  PARoxetine (PAXIL) 20 MG tablet Take 1 tablet (20 mg total) by mouth daily. 02/07/14  Yes Blain Pais, MD  amoxicillin (AMOXIL) 500 MG tablet Take 1 tablet (500 mg total) by mouth 2 (two) times daily. Patient not taking: Reported on 10/20/2014 09/27/14   Ejiroghene E Emokpae, MD   BP 163/85 mmHg  Pulse 86  Temp(Src) 98.2 F (36.8 C) (Oral)  Resp 16  Ht 5\' 1"  (1.549 m)  Wt 263 lb (119.296 kg)  BMI 49.72 kg/m2  SpO2 99%  LMP 06/17/2007 Physical Exam  Constitutional: She is oriented to person, place, and time. She appears well-developed and well-nourished. No distress.  HENT:  Head: Normocephalic and atraumatic.  Eyes: Conjunctivae and EOM are normal.  Neck: Normal range of motion.  Cardiovascular: Normal rate and regular rhythm.  Exam reveals no gallop and no friction rub.   No murmur heard. Pulmonary/Chest: Effort normal and breath sounds normal. She has no wheezes. She has no rales. She exhibits no tenderness.  Abdominal: Soft. She exhibits no distension. There is no tenderness. There is no rebound.  Genitourinary:  External hemorrhoid noted at the 6 o'clock position of the anus. The area is tender to palpation.   Musculoskeletal: Normal range of motion.  Neurological: She is alert and oriented to person, place, and time. Coordination normal.  Speech is goal-oriented. Moves limbs without ataxia.   Skin: Skin is warm and dry.  Psychiatric: She has a normal mood and affect. Her behavior is normal.  Nursing note and vitals reviewed.   ED Course  Procedures (including critical care time) Labs Review Labs Reviewed - No data to display  Imaging Review No results found.   EKG Interpretation None      MDM   Final diagnoses:  External hemorrhoid    5:13 AM Patient has a painful external hemorrhoid. Patient will be treated with anusol, miralax, and donut cushion. Vitals  stable and patient afebrile.     Alvina Chou, PA-C 10/20/14 [place], MD 10/20/14 [phone]

## 2014-11-07 ENCOUNTER — Encounter: Payer: Self-pay | Admitting: Internal Medicine

## 2014-12-21 ENCOUNTER — Encounter: Payer: Self-pay | Admitting: Internal Medicine

## 2014-12-27 ENCOUNTER — Telehealth: Payer: Self-pay | Admitting: Internal Medicine

## 2014-12-27 NOTE — Telephone Encounter (Signed)
Call to patient to confirm appointment for 12/28/14 at 3:45 lmtcb

## 2014-12-28 ENCOUNTER — Encounter: Payer: Self-pay | Admitting: Internal Medicine

## 2015-02-08 ENCOUNTER — Encounter: Payer: Self-pay | Admitting: *Deleted

## 2015-02-15 ENCOUNTER — Encounter: Payer: Self-pay | Admitting: Internal Medicine

## 2015-02-20 ENCOUNTER — Telehealth: Payer: Self-pay | Admitting: Internal Medicine

## 2015-02-20 NOTE — Telephone Encounter (Signed)
Call to patient to confirm appointment for 02/22/15 at 1:45 lmtcb

## 2015-02-22 ENCOUNTER — Emergency Department (HOSPITAL_COMMUNITY)
Admission: EM | Admit: 2015-02-22 | Discharge: 2015-02-22 | Discharge status: Against Medical Advice | Payer: Self-pay | Attending: Emergency Medicine | Admitting: Emergency Medicine

## 2015-02-22 ENCOUNTER — Encounter (HOSPITAL_COMMUNITY): Payer: Self-pay | Admitting: Emergency Medicine

## 2015-02-22 ENCOUNTER — Encounter: Payer: Self-pay | Admitting: Internal Medicine

## 2015-02-22 DIAGNOSIS — K5900 Constipation, unspecified: Secondary | ICD-10-CM | POA: Insufficient documentation

## 2015-02-22 DIAGNOSIS — E669 Obesity, unspecified: Secondary | ICD-10-CM | POA: Insufficient documentation

## 2015-02-22 LAB — CBC WITH DIFFERENTIAL/PLATELET
BASOS ABS: 0 10*3/uL (ref 0.0–0.1)
Basophils Relative: 0 % (ref 0–1)
Eosinophils Absolute: 0 10*3/uL (ref 0.0–0.7)
Eosinophils Relative: 0 % (ref 0–5)
HCT: 41.3 % (ref 36.0–46.0)
Hemoglobin: 13.6 g/dL (ref 12.0–15.0)
Lymphocytes Relative: 5 % — ABNORMAL LOW (ref 12–46)
Lymphs Abs: 0.5 10*3/uL — ABNORMAL LOW (ref 0.7–4.0)
MCH: 30.1 pg (ref 26.0–34.0)
MCHC: 32.9 g/dL (ref 30.0–36.0)
MCV: 91.4 fL (ref 78.0–100.0)
Monocytes Absolute: 0.9 10*3/uL (ref 0.1–1.0)
Monocytes Relative: 8 % (ref 3–12)
NEUTROS ABS: 9.1 10*3/uL — AB (ref 1.7–7.7)
NEUTROS PCT: 87 % — AB (ref 43–77)
PLATELETS: 203 10*3/uL (ref 150–400)
RBC: 4.52 MIL/uL (ref 3.87–5.11)
RDW: 13.6 % (ref 11.5–15.5)
WBC: 10.5 10*3/uL (ref 4.0–10.5)

## 2015-02-22 LAB — COMPREHENSIVE METABOLIC PANEL
ALT: 25 U/L (ref 14–54)
ANION GAP: 10 (ref 5–15)
AST: 25 U/L (ref 15–41)
Albumin: 4 g/dL (ref 3.5–5.0)
Alkaline Phosphatase: 98 U/L (ref 38–126)
BUN: 14 mg/dL (ref 6–20)
CHLORIDE: 105 mmol/L (ref 101–111)
CO2: 23 mmol/L (ref 22–32)
Calcium: 9.1 mg/dL (ref 8.9–10.3)
Creatinine, Ser: 1.08 mg/dL — ABNORMAL HIGH (ref 0.44–1.00)
GFR calc Af Amer: >60 mL/min (ref >60)
GFR, EST NON AFRICAN AMERICAN: 58 mL/min — AB (ref >60)
Glucose, Bld: 112 mg/dL — ABNORMAL HIGH (ref 65–99)
POTASSIUM: 3.8 mmol/L (ref 3.5–5.1)
Sodium: 138 mmol/L (ref 135–145)
Total Bilirubin: 1.9 mg/dL — ABNORMAL HIGH (ref 0.3–1.2)
Total Protein: 8.1 g/dL (ref 6.5–8.1)

## 2015-02-22 LAB — LIPASE, BLOOD: Lipase: 14 U/L — ABNORMAL LOW (ref 22–51)

## 2015-02-22 NOTE — ED Notes (Signed)
Patient called x4. No sign of patient in or around lobby.

## 2015-02-22 NOTE — ED Notes (Signed)
Pt c/o LLQ abdominal pain, nausea, and constipation, last BM was small BM today. Denies emesis.

## 2015-03-25 ENCOUNTER — Other Ambulatory Visit: Payer: Self-pay | Admitting: *Deleted

## 2015-03-25 DIAGNOSIS — F410 Panic disorder [episodic paroxysmal anxiety] without agoraphobia: Secondary | ICD-10-CM

## 2015-03-26 MED ORDER — PAROXETINE HCL 20 MG PO TABS: 20.0000 mg | ORAL_TABLET | Freq: Every day | ORAL | Status: DC

## 2015-03-26 NOTE — Telephone Encounter (Signed)
I will refill her Paxil, but please call the patient and have her follow-up in our clinic. She has not been seen in over a year.

## 2015-03-28 ENCOUNTER — Encounter: Payer: Self-pay | Admitting: Internal Medicine

## 2015-04-03 ENCOUNTER — Encounter (HOSPITAL_COMMUNITY): Payer: Self-pay | Admitting: Nurse Practitioner

## 2015-04-03 ENCOUNTER — Emergency Department (HOSPITAL_COMMUNITY)
Admission: EM | Admit: 2015-04-03 | Discharge: 2015-04-03 | Disposition: A | Discharge status: Home / Self Care | Payer: Self-pay | Attending: Emergency Medicine | Admitting: Emergency Medicine

## 2015-04-03 DIAGNOSIS — Z7952 Long term (current) use of systemic steroids: Secondary | ICD-10-CM | POA: Insufficient documentation

## 2015-04-03 DIAGNOSIS — IMO0002 Reserved for concepts with insufficient information to code with codable children: Secondary | ICD-10-CM

## 2015-04-03 DIAGNOSIS — M25532 Pain in left wrist: Secondary | ICD-10-CM | POA: Insufficient documentation

## 2015-04-03 DIAGNOSIS — Z862 Personal history of diseases of the blood and blood-forming organs and certain disorders involving the immune mechanism: Secondary | ICD-10-CM | POA: Insufficient documentation

## 2015-04-03 DIAGNOSIS — Z79899 Other long term (current) drug therapy: Secondary | ICD-10-CM | POA: Insufficient documentation

## 2015-04-03 DIAGNOSIS — R202 Paresthesia of skin: Secondary | ICD-10-CM | POA: Insufficient documentation

## 2015-04-03 DIAGNOSIS — E669 Obesity, unspecified: Secondary | ICD-10-CM | POA: Insufficient documentation

## 2015-04-03 DIAGNOSIS — Z8669 Personal history of other diseases of the nervous system and sense organs: Secondary | ICD-10-CM | POA: Insufficient documentation

## 2015-04-03 DIAGNOSIS — M79632 Pain in left forearm: Secondary | ICD-10-CM | POA: Insufficient documentation

## 2015-04-03 DIAGNOSIS — R200 Anesthesia of skin: Secondary | ICD-10-CM | POA: Insufficient documentation

## 2015-04-03 DIAGNOSIS — Z8719 Personal history of other diseases of the digestive system: Secondary | ICD-10-CM | POA: Insufficient documentation

## 2015-04-03 DIAGNOSIS — Z9089 Acquired absence of other organs: Secondary | ICD-10-CM | POA: Insufficient documentation

## 2015-04-03 DIAGNOSIS — I209 Angina pectoris, unspecified: Secondary | ICD-10-CM | POA: Insufficient documentation

## 2015-04-03 DIAGNOSIS — F410 Panic disorder [episodic paroxysmal anxiety] without agoraphobia: Secondary | ICD-10-CM | POA: Insufficient documentation

## 2015-04-03 MED ORDER — PREDNISONE 20 MG PO TABS
60.0000 mg | ORAL_TABLET | Freq: Once | ORAL | Status: AC
  Administered 2015-04-03: 60 mg via ORAL
  Filled 2015-04-03: qty 3

## 2015-04-03 MED ORDER — KETOROLAC TROMETHAMINE 60 MG/2ML IM SOLN
60.0000 mg | Freq: Once | INTRAMUSCULAR | Status: AC
  Administered 2015-04-03: 60 mg via INTRAMUSCULAR
  Filled 2015-04-03: qty 2

## 2015-04-03 MED ORDER — IBUPROFEN 800 MG PO TABS: 800.0000 mg | ORAL_TABLET | Freq: Three times a day (TID) | ORAL | Status: DC

## 2015-04-03 MED ORDER — PREDNISONE 20 MG PO TABS: 40.0000 mg | ORAL_TABLET | Freq: Every day | ORAL | Status: DC

## 2015-04-03 NOTE — ED Provider Notes (Signed)
CSN: [id number]     Arrival date & time 04/03/15  1427 History   First MD Initiated Contact with Patient 04/03/15 1439    This chart was scribed for non-physician practitioner, Delsa Grana, PA-C, working with Varney Biles, MD by Terressa Koyanagi, ED Scribe. This patient was seen in room TR05C/TR05C and the patient's care was started at 2:59 PM.  Chief Complaint  Patient presents with  . Arm Pain   The history is provided by the patient. No language interpreter was used.   PCP: Arman Filter, MD HPI Comments: [name] is a 54 y.o. 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   The pain has gradually increased, seeming to creep up her arm and worsened as she tried to use her left shoulder to immobilize her left wrist.  It feels best held close to her stomach and without use of her forearm, wrist and fingers.  She decided to seek tx today because the pain is not throughout her left arm and it has prevented her from getting any sleep.  Pt complains of several life stressors today, and was so panicked that she did not take any of her morning medication, including her BP and anxiety meds.  She also has increased anxiety when coming to [place], due to the traumatic death of her mother and father at this facility, and multiple open heart surgeries for her husband.   Past Medical History  Diagnosis Date  . Hypertriglyceridemia     164 - 2/11  . Panic disorder 11/05  . Obesity     250 lbs 8/11  . Atypical chest pain     Negative  cardiolite at [facility] cards 11/07  . Hypothyroidism   . Insomnia   . Allergic rhinitis   . Elevated BP   . Anginal pain   . Shortness of breath 05/18/2012    "at rest; lying down; w/exertion"  . Perirectal abscess 11/14/2011  . CHOLECYSTECTOMY, HX OF     Annotation: 2000 Qualifier: Diagnosis of  By: Eddie Dibbles MD, Bhakti    . History of blood transfusion 03/2008    Requried 2u prbc; menometrorrhagia, uterine fibroids.  . Anemia, iron deficiency    Past Surgical History  Procedure Laterality Date  . Incision and drainage perirectal abscess  11/14/2011    Procedure: IRRIGATION AND DEBRIDEMENT PERIRECTAL ABSCESS;  Surgeon: Zenovia Jarred, MD;  Location: [facility];  Service: General;  Laterality: N/A;  . Abdominal hysterectomy  04/2007    total abdominal hysterectomy ( cervix) with bilateral salpingo-oopherectomy  . Dilation and curettage of uterus  1987  . Incision and drainage abscess  11/02/2012    Procedure: INCISION AND DRAINAGE ABSCESS;  Surgeon: Odis Hollingshead, MD;  Location: [facility];  Service: General;  Laterality: N/A;  I&D anorectal abscess  . Cholecystectomy  07/30/1999    Diagnosis of  By: Eddie Dibbles MD, Bhakti    . Nerve, tendon and artery repair Left 06/11/2013    Procedure: EXPLORATION AND REPAIR LEFT FOREARM;  Surgeon: Schuyler Amor, MD;  Location: [facility];  Service: Orthopedics;  Laterality: Left;   Family History  Problem Relation Age of Onset  . Heart attack Mother     at the age of 56's  . Lung cancer Mother     metestatic, + smoker  . Aneurysm Mother   . Hyperlipidemia Mother   . Hypertension Mother   . Hypertension Father   . Heart attack Maternal Grandmother     at the age of 10's   History  Substance Use Topics  . Smoking status: Never Smoker   . Smokeless tobacco: Never Used  . Alcohol Use: Yes     Comment: 05/18/2012 "have a taste of a drink maybe once/yr"   OB History    No data available     Review of Systems  Constitutional: Negative for fever and chills.   Respiratory: Negative for shortness of breath.   Cardiovascular: Negative for chest pain.  Musculoskeletal: Positive for myalgias.       Left arm pain  Neurological: Positive for numbness. Negative for syncope, light-headedness and headaches.  Psychiatric/Behavioral: The patient is nervous/anxious.    Allergies  Codeine; Doxycycline; Hydrocodone; Oxycodone; Dilaudid; and Sulfamethoxazole-trimethoprim  Home Medications   Prior to Admission medications   Medication Sig Start Date End Date Taking? Authorizing Provider  acetaminophen (TYLENOL) 500 MG tablet Take 1,000 mg by mouth every 6 (six) hours as needed for mild pain.    Historical Provider, MD  amoxicillin (AMOXIL) 500 MG tablet Take 1 tablet (500 mg total) by mouth 2 (two) times daily. Patient not taking: Reported on 10/20/2014 09/27/14   Ejiroghene Arlyce Dice, MD  hydrochlorothiazide (MICROZIDE) 12.5 MG capsule Take 1 capsule (12.5 mg total) by mouth daily. 02/07/14   Blain Pais, MD  hydrocortisone (ANUSOL-HC) 2.5 % rectal cream Apply rectally 2 times daily 10/20/14   Alvina Chou, PA-C  PARoxetine (PAXIL) 20 MG tablet Take 1 tablet (20 mg total) by mouth daily. 03/26/15   Langley Gauss Moding, MD  polyethylene glycol powder (MIRALAX) powder Take 255 g by mouth once. 10/20/14   Alvina Chou, PA-C   Triage Vitals: There were no vitals filed for this visit.  Physical Exam  Constitutional: She is oriented to person, place, and time. She appears well-developed and well-nourished. No distress.  Obese, anxious and fidgety-appearing female, appears chronically ill and unkept  HENT:  Head: Normocephalic and atraumatic.  Right Ear: External ear normal.  Left Ear: External ear normal.  Nose: Nose normal.  Eyes: Conjunctivae and EOM are normal. Pupils are equal, round, and reactive to light. Right eye exhibits no discharge. Left eye exhibits no discharge. No scleral icterus.  Neck: Normal range of motion. Neck supple. No JVD  present. No tracheal deviation present.  Cardiovascular: Normal rate and regular rhythm.   No murmur heard. Pulmonary/Chest: Effort normal and breath sounds normal. No accessory muscle usage. No tachypnea. No respiratory distress. She has no decreased breath sounds. She has no wheezes. She has no rhonchi. She has no rales.  Musculoskeletal:       Left hand: She exhibits normal range of motion and normal capillary refill. Decreased sensation noted. Decreased sensation is present in the medial distribution. Decreased sensation is not present in the ulnar distribution and is not present in the radial distribution. Normal strength noted. She exhibits no finger abduction, no thumb/finger opposition and no wrist extension trouble.  Normal ROM of left wrist and left fingers. Normal strength of left wrist and left fingers. Positive tinel's on left with increased tingling on 2nd  and 3rd digits. Positive phalen's test.Nl cap refill in left hand.   Neurological: She is alert and oriented to person, place, and time. A sensory deficit is present. She exhibits normal muscle tone.  Sensory deficit on left palmar aspect of medial thumb and index finger, unable to discriminate sharp/dull, all other sensation of left hand and arm intact.  Skin: Skin is warm and dry. No bruising, no ecchymosis, no laceration and no rash noted. She is not diaphoretic. No cyanosis or erythema. No pallor. Nails show no clubbing.  Multiple scars and deformities bilaterally on forearms  Psychiatric: Judgment and thought content normal. Her mood appears anxious. Her speech is rapid and/or pressured. She is hyperactive. Cognition and memory are normal.  Nursing note and vitals reviewed.   ED Course  Procedures (including critical care time) COORDINATION OF CARE: 3:06 PM-Discussed treatment plan which includes steroids, muscle relaxer, meds for pain, and ortho referral with pt at bedside and pt agreed to plan. Pt reports she has a ride home.    Labs Review Labs Reviewed - No data to display  Imaging Review No results found.   EKG Interpretation None      MDM   Final diagnoses:  None   Patient presents with tingling and numbness of her medial thumb and entire pointer finger on her left hand.  Numbness and tingling is exacerbated by Tinel's and Phalen signs.  Normal range of motion of all digits and wrist and elbow. Normal sensation and capillary refill. With history suspect this is carpal tunnel, and less likely a tendinitis. Will treat patient with NSAIDs and a steroidal taper, also will immobilize her, and provide her with orthofollow-up.  Patient is hypertensive in the ER today, states she did not take any of her morning meds for anxiety and for her hypertension. She is not having any symptoms of headache, blurry vision, chest pain, shortness of breath. She feels better with treatment of her pain and is comfortable with discharging home.  She has had multiple family members traumatic deaths in the hospital and has increased anxiety and panic attacks when coming to this hospital.   Medications  ketorolac (TORADOL) injection 60 mg (60 mg Intramuscular Given 04/03/15 1527)  predniSONE (DELTASONE) tablet 60 mg (60 mg Oral Given 04/03/15 1528)     Filed Vitals:   04/03/15 1432 04/03/15 1522 04/03/15 1558  BP:  165/96 171/101  Pulse:  80 71  Temp:  98.5 F (36.9 C)   TempSrc:  Oral   Resp:  20 16  Height: 5\' 1"  (1.549 m)    Weight: 279 lb 8 oz (126.78 kg)    SpO2:  98%    AFter discussing BP with pt, she appears more anxious to leave than when first examining the patient.  She is extremely anxious to leave and be with her husband.  We have discussed signs of HTN urgency, which she does not have any sx of at this time, She has agreed to take her anxiety and HTN medicine when she gets home.  I am comfortable discharging her with everything we have discussed.  I believe it would be better for her to go home now and take  medicine, than it would be to keep her here with known panic disorder and her extreme discomfort of being in the facility.  She is comfortable with the plan to discharge home.   I personally performed the services described in this documentation, which was scribed in my presence. The recorded information has  been reviewed and is accurate.    Delsa Grana, PA-C 04/05/15 [place], MD 04/07/15 1420

## 2015-04-03 NOTE — Progress Notes (Signed)
Orthopedic Tech Progress Note Patient Details:  [name] 05/06/1961 [id number]  Ortho Devices Type of Ortho Device: Velcro wrist splint Ortho Device/Splint Location: LUE Ortho Device/Splint Interventions: Ordered, Application   Braulio Bosch 04/03/2015, 3:25 PM

## 2015-04-03 NOTE — ED Notes (Signed)
Pt reports L forearm pain since last Tuesday. She felt she may have a pulled muscle, she tried ice and ibuprofen with no relief.she reports the pain is getting worse since onset. She had a significant dog bite to this arm many years ago with visible scarring

## 2015-04-03 NOTE — ED Notes (Signed)
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 Pt reports pain to Lt thumb and index finger. Pt also reports tingling to both.

## 2015-04-03 NOTE — ED Notes (Signed)
Mother states " Pt does not have any insurance and [facility] will not see Pt with out insurance."

## 2015-04-11 ENCOUNTER — Telehealth: Payer: Self-pay | Admitting: Internal Medicine

## 2015-04-11 NOTE — Telephone Encounter (Signed)
Call to patient to confirm appointment for 04/12/15 at 1:30 lmtcb

## 2015-04-12 ENCOUNTER — Ambulatory Visit (INDEPENDENT_AMBULATORY_CARE_PROVIDER_SITE_OTHER): Payer: Self-pay | Admitting: Internal Medicine

## 2015-04-12 ENCOUNTER — Encounter: Payer: Self-pay | Admitting: Internal Medicine

## 2015-04-12 VITALS — BP 161/78 | HR 71 | Temp 98.8°F | Ht 61.0 in | Wt 277.0 lb

## 2015-04-12 DIAGNOSIS — Z0000 Encounter for general adult medical examination without abnormal findings: Secondary | ICD-10-CM

## 2015-04-12 DIAGNOSIS — M25512 Pain in left shoulder: Secondary | ICD-10-CM

## 2015-04-12 DIAGNOSIS — I10 Essential (primary) hypertension: Secondary | ICD-10-CM

## 2015-04-12 DIAGNOSIS — M25532 Pain in left wrist: Secondary | ICD-10-CM | POA: Insufficient documentation

## 2015-04-12 DIAGNOSIS — M25519 Pain in unspecified shoulder: Secondary | ICD-10-CM | POA: Insufficient documentation

## 2015-04-12 DIAGNOSIS — E039 Hypothyroidism, unspecified: Secondary | ICD-10-CM

## 2015-04-12 MED ORDER — HYDROCHLOROTHIAZIDE 12.5 MG PO CAPS: 12.5000 mg | ORAL_CAPSULE | Freq: Every day | ORAL | Status: DC

## 2015-04-12 NOTE — Assessment & Plan Note (Addendum)
Continue use of wrist brace and continue pain management with Ibuprofen 800 mg

## 2015-04-12 NOTE — Assessment & Plan Note (Addendum)
Refill and continue HCTZ 12.5 mg by mouth daily.

## 2015-04-12 NOTE — Assessment & Plan Note (Signed)
We will continue her Ibuprofen 800 mg as is, and once she has her orange card we will refer her to sports medicine for musculoskeletal ultrasound.

## 2015-04-12 NOTE — Assessment & Plan Note (Signed)
We will check TSH today in clinic and follow up next appointment.

## 2015-04-12 NOTE — Progress Notes (Signed)
Patient ID: [name], female   DOB: 1961/03/08, 54 y.o.   MRN: [id number]   Subjective:   Patient ID: [name] female   DOB: [birth year] 54 y.o.   MRN: [id number]  HPI: [name] is a 54 y.o. female with significant PMH of dog bite to left arm, HTN, and hypothyroidism.  Today she presented with complaints of joint pain in left shoulder and left wrist. The pain in her wrist began 2 weeks ago and she was seen in the ED 1 week ago. Since the dog bite occurred 2 years ago, she has had recurrent pain and weakness in the left wrist due to nerve, tendon, and artery damage. Her pain became so severe that she had to take a leave from work, which involves repetitive motions of the left arm, and decided to go to the ED. In the ED she was given prescriptions for Ibuprofen 800 mg, Prednisone 20 mg, and a wrist brace. She mentions that the Ibuprofen and wrist brace do relieve some of the pain. After her visit to the hospital she began having left shoulder pain which she describes as sharp/tingling and radiating down her arm. Her range of motion is significantly decreased. She also mentions that she has not been taking her blood pressure medication for the last month after her prescription ran out. During this time she noticed that she would become easily short of breath when walking short distances and also noticed swelling in her lower extremities. She denies any chest pain, pain radiating to the jaw or back, or any deficits in her right arm.    Past Medical History  Diagnosis Date   Hypertriglyceridemia     164 - 2/11   Panic disorder 11/05   Obesity     250 lbs 8/11   Atypical chest pain     Negative cardiolite at [place] cards 11/07   Hypothyroidism    Insomnia    Allergic rhinitis    Elevated BP    Anginal pain    Shortness of breath 05/18/2012    "at rest; lying down; w/exertion"   Perirectal abscess 11/14/2011   CHOLECYSTECTOMY, HX OF     Annotation: 2000 Qualifier:  Diagnosis of  By: Eddie Dibbles MD, Bhakti     History of blood transfusion 03/2008    Requried 2u prbc; menometrorrhagia, uterine fibroids.   Anemia, iron deficiency    Current Outpatient Prescriptions  Medication Sig Dispense Refill   acetaminophen (TYLENOL) 500 MG tablet Take 1,000 mg by mouth every 6 (six) hours as needed for mild pain.     hydrochlorothiazide (MICROZIDE) 12.5 MG capsule Take 1 capsule (12.5 mg total) by mouth daily. 30 capsule 11   hydrocortisone (ANUSOL-HC) 2.5 % rectal cream Apply rectally 2 times daily 28.35 g 0   ibuprofen (ADVIL,MOTRIN) 800 MG tablet Take 1 tablet (800 mg total) by mouth 3 (three) times daily. 21 tablet 0   PARoxetine (PAXIL) 20 MG tablet Take 1 tablet (20 mg total) by mouth daily. 90 tablet 0   polyethylene glycol powder (MIRALAX) powder Take 255 g by mouth once. 850 g 0   predniSONE (DELTASONE) 20 MG tablet Take 2 tablets (40 mg total) by mouth daily. Take 40 mg by mouth daily for 3 days, then 20mg  by mouth daily for 3 days, then 10mg  daily for 3 days 12 tablet 0   No current facility-administered medications for this visit.   Family History  Problem Relation Age of Onset   Heart attack  Mother     at the age of 62's   Lung cancer Mother     metestatic, + smoker   Aneurysm Mother    Hyperlipidemia Mother    Hypertension Mother    Hypertension Father    Heart attack Maternal Grandmother     at the age of 61's   History   Social History   Marital Status: Married    Spouse Name: N/A   Number of Children: N/A   Years of Education: N/A   Social History Main Topics   Smoking status: Never Smoker    Smokeless tobacco: Never Used   Alcohol Use: 0.0 oz/week    0 Standard drinks or equivalent per week     Comment: 05/18/2012 "have a taste of a drink maybe once/yr"   Drug Use: No   Sexual Activity: Not Currently   Other Topics Concern   None   Social History Narrative   Currently unemployed. Starting college in Aug for  accounting.  Married, has 2 children lives with her youngest son.     Review of Systems: Review of Systems  Constitutional: Negative for fever and chills.  HENT: Negative for congestion and sore throat.   Respiratory: Positive for cough and shortness of breath. Negative for hemoptysis, sputum production and wheezing.   Cardiovascular: Positive for leg swelling. Negative for chest pain, palpitations and claudication.  Gastrointestinal: Negative for nausea, vomiting, abdominal pain, diarrhea and constipation.  Musculoskeletal: Positive for joint pain.  Skin: Negative for itching and rash.  Neurological: Positive for tingling. Negative for dizziness and headaches.    Objective:  Physical Exam: Filed Vitals:   04/12/15 1327  BP: 161/78  Pulse: 71  Temp: 98.8 F (37.1 C)  TempSrc: Oral  Height: 5\' 1"  (1.549 m)  Weight: 277 lb (125.646 kg)  SpO2: 99%   Physical Exam  Constitutional: She is oriented to person, place, and time. She appears well-developed and well-nourished.  HENT:  Head: Normocephalic and atraumatic.  Cardiovascular: Normal rate, regular rhythm and intact distal pulses.  Exam reveals no gallop and no friction rub.   No murmur heard. Pulmonary/Chest: Effort normal and breath sounds normal. No respiratory distress. She has no wheezes. She has no rales. She exhibits no tenderness.  Abdominal: Soft. Bowel sounds are normal. She exhibits no distension. There is no tenderness. There is no guarding.  Musculoskeletal: She exhibits no edema.       Right shoulder: Normal.       Left shoulder: She exhibits decreased range of motion, tenderness, pain and decreased strength. She exhibits no swelling, no effusion, no crepitus, no deformity and no laceration.       Right elbow: Normal.      Left elbow: She exhibits decreased range of motion.       Right wrist: Normal.       Left wrist: She exhibits decreased range of motion. She exhibits no swelling, no effusion and no crepitus.        Right ankle: She exhibits no swelling.       Left ankle: She exhibits no swelling.       Right hand: She exhibits normal range of motion and normal capillary refill. Normal sensation noted. Normal strength noted.       Left hand: She exhibits normal capillary refill. Decreased sensation is not present in the ulnar distribution, is not present in the medial redistribution and is not present in the radial distribution. Decreased strength noted. She exhibits wrist extension trouble.  She has significant scarring on left forearm.  Neurological: She is alert and oriented to person, place, and time.   Assessment & Plan:  Please see problem list for assessment and plan.

## 2015-04-12 NOTE — Patient Instructions (Signed)
Thank you for your visit.  1. For your shoulder pain we will continue Ibuprofen 800 mg tablets and once you have your orange card we will refer you to sports medicine to have an ultrasound.  2. For your wrist pain we will continue use of the brace and as well as the ibuprofen as above.  3. For your thyroid we will have your TSH checked in clinic and follow up accordingly.  4. As we discussed, please complete the health maintenance form to have a mammogram completed.  Please schedule a follow up appointment in 2-3 weeks.

## 2015-04-12 NOTE — Assessment & Plan Note (Signed)
Patient is due for mammogram. We discussed and she agreed to have paperwork submitted to schedule mammogram and have it completed.

## 2015-04-13 LAB — TSH: TSH: 7.549 u[IU]/mL — ABNORMAL HIGH (ref 0.350–4.500)

## 2015-04-16 NOTE — Progress Notes (Signed)
Internal Medicine Clinic Attending  I saw and evaluated the patient.  I personally confirmed the key portions of the history and exam documented by Dr. Zada Finders and I reviewed pertinent patient test results.  The assessment, diagnosis, and plan were formulated together and I agree with the documentation in the resident's note. Shoulder pain started after she had a new repetitive task at work and seems c/w tendonitis/bursitis. Agree with sports med referral. Wrist pain doesn't seem most c/w CTS - more likely residual pain from dog bite and post surgical changes.

## 2015-05-02 ENCOUNTER — Telehealth: Payer: Self-pay | Admitting: Internal Medicine

## 2015-05-02 NOTE — Telephone Encounter (Signed)
Calling patient to confirm appointment for 05/03/15 at 1:30 phones are disconnected

## 2015-05-03 ENCOUNTER — Ambulatory Visit: Payer: Self-pay | Admitting: Internal Medicine

## 2015-06-18 ENCOUNTER — Encounter: Payer: Self-pay | Admitting: Internal Medicine

## 2015-07-10 ENCOUNTER — Encounter: Payer: Self-pay | Admitting: Internal Medicine

## 2015-07-24 ENCOUNTER — Other Ambulatory Visit: Payer: Self-pay | Admitting: Internal Medicine

## 2015-07-24 DIAGNOSIS — F410 Panic disorder [episodic paroxysmal anxiety] without agoraphobia: Secondary | ICD-10-CM

## 2015-07-24 MED ORDER — PAROXETINE HCL 20 MG PO TABS: 20.0000 mg | ORAL_TABLET | Freq: Every day | ORAL | Status: DC

## 2015-07-24 NOTE — Telephone Encounter (Signed)
Pt called requesting Paroxetine to be filled @ [facility] on [address].

## 2015-08-27 ENCOUNTER — Encounter: Payer: Self-pay | Admitting: Student

## 2015-09-04 ENCOUNTER — Encounter: Payer: Self-pay | Admitting: Internal Medicine

## 2015-09-04 ENCOUNTER — Ambulatory Visit (INDEPENDENT_AMBULATORY_CARE_PROVIDER_SITE_OTHER): Payer: Self-pay | Admitting: Internal Medicine

## 2015-09-04 VITALS — BP 175/98 | HR 78 | Temp 98.0°F | Wt 289.7 lb

## 2015-09-04 DIAGNOSIS — Z0000 Encounter for general adult medical examination without abnormal findings: Secondary | ICD-10-CM

## 2015-09-04 DIAGNOSIS — F410 Panic disorder [episodic paroxysmal anxiety] without agoraphobia: Secondary | ICD-10-CM

## 2015-09-04 DIAGNOSIS — I10 Essential (primary) hypertension: Secondary | ICD-10-CM

## 2015-09-04 DIAGNOSIS — E039 Hypothyroidism, unspecified: Secondary | ICD-10-CM

## 2015-09-04 DIAGNOSIS — Z23 Encounter for immunization: Secondary | ICD-10-CM

## 2015-09-04 MED ORDER — PAROXETINE HCL 20 MG PO TABS: 30.0000 mg | ORAL_TABLET | Freq: Every day | ORAL | Status: DC

## 2015-09-04 MED ORDER — ALPRAZOLAM 0.5 MG PO TABS
0.5000 mg | ORAL_TABLET | Freq: Once | ORAL | Status: AC | PRN
Start: 2015-09-04 — End: 2016-09-03

## 2015-09-04 MED ORDER — LISINOPRIL 20 MG PO TABS: 20.0000 mg | ORAL_TABLET | Freq: Every day | ORAL | Status: DC

## 2015-09-04 MED ORDER — LEVOTHYROXINE SODIUM 50 MCG PO TABS: 50.0000 ug | ORAL_TABLET | Freq: Every day | ORAL | Status: DC

## 2015-09-04 NOTE — Assessment & Plan Note (Signed)
Patient with complaints of feeling like her legs are heavy with swelling in her lower extremities for the last 2-3 weeks. She reports dry pruritic skin, intolerance to cold, weight gain, SOB when walking short distances or doing chores at home (ex. Washing dishes). Her SOB is relieved with about 5 minutes of rest. She denies orthopnea or PND. She is not currently on Synthroid, but has taken it in the past. She says she experienced chest tightness everytime she would take Synthroid when her prior dose was increased to 125 mcg. TSH on 04/12/2015 was elevated at 7.549.  A/P: Patient with symptomatic hypothyroidism. Her TSH is elevated, but not quite as high as it has been in the past (~20). Thyroid U/S from 09/22/12 showed a diffusely enlarged heterogenous thyroid w/o discrete nodule consistent with a multinodular goiter. -Start Synthroid 50 mcg -f/u in 4-6 weeks and repeat TSH at next visit

## 2015-09-04 NOTE — Patient Instructions (Signed)
You may increase your Paxil (Paroxetine) to 30 mg daily. I will give you a short supply of Xanax for your panic attacks, please only take when needed.  I have sent a prescription for Lisinopril 20 mg to your pharmacy. This is for your blood pressure, please continue your HCTZ as well.  We will try restarting Synthroid at 50 mcg. Please let me know if you have any side effects to this.  We have given you the flu shot today and a scholarship form for your mammogram.  Please follow up in 4-6 weeks.

## 2015-09-04 NOTE — Assessment & Plan Note (Signed)
Patient has not had flu shot yet this year and is overdue for her screening mammogram. -Flu shot given today -Scholarship form completed to have Mammogram completed at the [facility].

## 2015-09-04 NOTE — Assessment & Plan Note (Signed)
Patient checks her blood pressure at home with elevated readings of 170/95, 166/93, and 201/125. She states she is under stress at home with recent panic attacks which she believes is contributing to her elevated pressures. She does not report headaches or changes in vision. She is currently on HCTZ 12.5 mg daily. Her BP today is 175/98.  A/P: HTN not well controlled at this time. Her serum creatinine has been stable with baseline ~1.0 for several years now. Will start her on an ACEI. -Lisinopril 20 mg po daily -f/u in 4 weeks

## 2015-09-04 NOTE — Progress Notes (Signed)
Patient ID: [name], female   DOB: [birth year], 54 y.o.   MRN: [id number]   Subjective:   Patient ID: [name] female   DOB: 1961-08-08 54 y.o.   MRN: [id number]  HPI: [name] is a 54 y.o. female with PMH of HTN, Panic Disorder, and Hypothyroidism who presents for management of her HTN and Panic Disorder. Please see problem list for status of patient's chronic medical issues.   Past Medical History  Diagnosis Date  . Hypertriglyceridemia     164 - 2/11  . Panic disorder 11/05  . Obesity     250 lbs 8/11  . Atypical chest pain     Negative cardiolite at [facility] cards 11/07  . Hypothyroidism   . Insomnia   . Allergic rhinitis   . Elevated BP   . Anginal pain ([place])   . Shortness of breath 05/18/2012    "at rest; lying down; w/exertion"  . Perirectal abscess 11/14/2011  . CHOLECYSTECTOMY, HX OF     Annotation: 2000 Qualifier: Diagnosis of  By: Eddie Dibbles MD, Bhakti    . History of blood transfusion 03/2008    Requried 2u prbc; menometrorrhagia, uterine fibroids.  . Anemia, iron deficiency    Current Outpatient Prescriptions  Medication Sig Dispense Refill  . acetaminophen (TYLENOL) 500 MG tablet Take 1,000 mg by mouth every 6 (six) hours as needed for mild pain.    . hydrochlorothiazide (MICROZIDE) 12.5 MG capsule Take 1 capsule (12.5 mg total) by mouth daily. 30 capsule 11  . ibuprofen (ADVIL,MOTRIN) 800 MG tablet Take 1 tablet (800 mg total) by mouth 3 (three) times daily. 21 tablet 0  . PARoxetine (PAXIL) 20 MG tablet Take 1.5 tablets (30 mg total) by mouth daily. 90 tablet 3  . ALPRAZolam (XANAX) 0.5 MG tablet Take 1 tablet (0.5 mg total) by mouth once as needed for anxiety or sleep. 15 tablet 0  . levothyroxine (SYNTHROID) 50 MCG tablet Take 1 tablet (50 mcg total) by mouth daily. 30 tablet 2  . lisinopril (PRINIVIL,ZESTRIL) 20 MG tablet Take 1 tablet (20 mg total) by mouth daily. 30 tablet 2   No current facility-administered medications for this visit.    Family History  Problem Relation Age of Onset  . Heart attack Mother     at the age of 28's  . Lung cancer Mother     metestatic, + smoker  . Aneurysm Mother   . Hyperlipidemia Mother   . Hypertension Mother   . Hypertension Father   . Heart attack Maternal Grandmother     at the age of 77's   Social History   Social History  . Marital Status: Married    Spouse Name: N/A  . Number of Children: N/A  . Years of Education: N/A   Social History Main Topics  . Smoking status: Never Smoker   . Smokeless tobacco: Never Used  . Alcohol Use: 0.0 oz/week    0 Standard drinks or equivalent per week     Comment: 05/18/2012 "have a taste of a drink maybe once/yr"  . Drug Use: No  . Sexual Activity: Not Currently   Other Topics Concern  . None   Social History Narrative   Currently unemployed. Starting college in Aug for accounting.  Married, has 2 children lives with her youngest son.     Review of Systems: Review of Systems  Constitutional: Negative for fever and chills.  Respiratory: Positive for shortness of breath. Negative for cough, sputum production  and wheezing.   Cardiovascular: Positive for leg swelling. Negative for chest pain, palpitations, orthopnea and PND.  Gastrointestinal: Negative for nausea, vomiting, abdominal pain, diarrhea, constipation, blood in stool and melena.  Genitourinary: Negative for dysuria.  Musculoskeletal: Positive for myalgias. Negative for back pain, joint pain, falls and neck pain.  Skin: Positive for itching. Negative for rash.  Neurological: Positive for weakness. Negative for dizziness, tingling and headaches.  Psychiatric/Behavioral:       Reports anxiety/panic attacks.    Objective:  Physical Exam: Filed Vitals:   09/04/15 1325  BP: 175/98  Pulse: 78  Temp: 98 F (36.7 C)  TempSrc: Oral  Weight: 289 lb 11.2 oz (131.407 kg)  SpO2: 100%   Physical Exam  Constitutional: She appears well-developed and well-nourished. No  distress.  HENT:  Head: Normocephalic and atraumatic.  Neck:  Goiter present, no tenderness to palpation.  Cardiovascular: Normal rate and regular rhythm.   No murmur heard. Pulmonary/Chest: Effort normal and breath sounds normal. No respiratory distress. She has no wheezes. She has no rales.  Abdominal: Soft. There is no tenderness.  Musculoskeletal: She exhibits no tenderness.  +1 pitting edema bilateral lower extremities  Lymphadenopathy:    She has no cervical adenopathy.  Skin: Skin is warm and dry. No rash noted.  Psychiatric: She has a normal mood and affect.    Assessment & Plan:  Please see problem based charting for assessment and plan.

## 2015-09-05 NOTE — Assessment & Plan Note (Signed)
Patient reports recent recurrence of panic attacks which she attributes to life stresses. She is worried about her husband's health and her daughter's pregnancy. She reports taking Xanax in the past for her panic disorder, but only uses this medication for severe attacks. She says a 30 pill supply of Xanax can last her 6 months. She says she is having small panic attacks about 2-3 times a day recently. Her symptoms during these episodes include feelings of being scared, feeling hot, crying, and "shutting down." These can last for a few minutes and up to an hour. She is currently on Paxil 20 mg daily, but does admit to doubling her dose about twice a week during this time-period.  A/P: Patient with recurrence of symptoms from her Panic Disorder. I will increase her Paxil and give small supply of Xanax to cover for her attacks until we can find a dose of Paxil which works for her. I discussed with her that our ultimate plan is to control her symptoms with Paxil alone and try to avoid further Xanax. -Increase Paxil to 30 mg daily -Give Xanax 0.5 mg once daily prn #15 for severe panic attacks -Follow up in 4 weeks, can titrate Paxil up if no improvement in symptoms

## 2015-09-10 ENCOUNTER — Ambulatory Visit: Payer: Self-pay

## 2015-09-10 NOTE — Progress Notes (Signed)
Internal Medicine Clinic Attending  I saw and evaluated the patient.  I personally confirmed the key portions of the history and exam documented by Dr. Patel,Vishal and I reviewed pertinent patient test results.  The assessment, diagnosis, and plan were formulated together and I agree with the documentation in the resident's note.  

## 2015-09-13 ENCOUNTER — Ambulatory Visit: Payer: Self-pay

## 2015-10-02 ENCOUNTER — Encounter: Payer: Self-pay | Admitting: Internal Medicine

## 2015-10-13 DIAGNOSIS — Z8614 Personal history of Methicillin resistant Staphylococcus aureus infection: Secondary | ICD-10-CM

## 2015-10-13 HISTORY — DX: Personal history of Methicillin resistant Staphylococcus aureus infection: Z86.14

## 2015-11-07 ENCOUNTER — Telehealth: Payer: Self-pay | Admitting: Internal Medicine

## 2015-11-07 NOTE — Telephone Encounter (Signed)
Cal to patient to confirm appointment for 11/08/15 at 9:45 phone is not in service

## 2015-11-08 ENCOUNTER — Encounter: Payer: Self-pay | Admitting: Internal Medicine

## 2015-11-08 ENCOUNTER — Ambulatory Visit (INDEPENDENT_AMBULATORY_CARE_PROVIDER_SITE_OTHER): Payer: Self-pay | Admitting: Internal Medicine

## 2015-11-08 VITALS — BP 153/85 | HR 63 | Temp 98.3°F | Ht 61.0 in | Wt 282.3 lb

## 2015-11-08 DIAGNOSIS — N3000 Acute cystitis without hematuria: Secondary | ICD-10-CM | POA: Insufficient documentation

## 2015-11-08 DIAGNOSIS — G8929 Other chronic pain: Secondary | ICD-10-CM

## 2015-11-08 DIAGNOSIS — E669 Obesity, unspecified: Secondary | ICD-10-CM

## 2015-11-08 DIAGNOSIS — M545 Low back pain, unspecified: Secondary | ICD-10-CM

## 2015-11-08 DIAGNOSIS — I872 Venous insufficiency (chronic) (peripheral): Secondary | ICD-10-CM

## 2015-11-08 DIAGNOSIS — Z806 Family history of leukemia: Secondary | ICD-10-CM

## 2015-11-08 DIAGNOSIS — R350 Frequency of micturition: Secondary | ICD-10-CM

## 2015-11-08 DIAGNOSIS — Z6841 Body Mass Index (BMI) 40.0 and over, adult: Secondary | ICD-10-CM

## 2015-11-08 DIAGNOSIS — B9689 Other specified bacterial agents as the cause of diseases classified elsewhere: Secondary | ICD-10-CM

## 2015-11-08 HISTORY — DX: Other chronic pain: G89.29

## 2015-11-08 HISTORY — DX: Venous insufficiency (chronic) (peripheral): I87.2

## 2015-11-08 HISTORY — DX: Low back pain, unspecified: M54.50

## 2015-11-08 LAB — POCT URINALYSIS DIPSTICK
BILIRUBIN UA: NEGATIVE
Glucose, UA: NEGATIVE
KETONES UA: NEGATIVE
NITRITE UA: NEGATIVE
PROTEIN UA: 100
Spec Grav, UA: 1.02
Urobilinogen, UA: 0.2
pH, UA: 5.5

## 2015-11-08 MED ORDER — MEDICAL COMPRESSION STOCKINGS MISC: 1.0000 | Status: DC

## 2015-11-08 MED ORDER — NITROFURANTOIN MONOHYD MACRO 100 MG PO CAPS: 100.0000 mg | ORAL_CAPSULE | Freq: Two times a day (BID) | ORAL | Status: AC

## 2015-11-08 NOTE — Assessment & Plan Note (Signed)
-   Discussed importance of weight loss and exercise.

## 2015-11-08 NOTE — Patient Instructions (Signed)
General Instructions:  I want you to try to pick up medical compression stocking at a medical device store.  It is best to put this on first thing in the morning.  Please bring your medicines with you each time you come to clinic.  Medicines may include prescription medications, over-the-counter medications, herbal remedies, eye drops, vitamins, or other pills.   Progress Toward Treatment Goals:  No flowsheet data found.  Self Care Goals & Plans:  Self Care Goal 04/12/2015  Manage my medications take my medicines as prescribed; bring my medications to every visit; refill my medications on time  Eat healthy foods drink diet soda or water instead of juice or soda; eat more vegetables; eat foods that are low in salt; eat baked foods instead of fried foods; eat fruit for snacks and desserts    No flowsheet data found.   Care Management & Community Referrals:  No flowsheet data found.

## 2015-11-08 NOTE — Progress Notes (Signed)
[facility] Subjective:   Patient ID: [name] female   DOB: 10-Dec-1960 55 y.o.   MRN: [id number]  HPI: [name] is a 55 y.o. female with a PMH detailed below who presents for 3 days of urinary frequency.  Please see problem based charting below for the status of her chronic medical problems.    Past Medical History  Diagnosis Date  . Hypertriglyceridemia     164 - 2/11  . Panic disorder 11/05  . Obesity     250 lbs 8/11  . Atypical chest pain     Negative cardiolite at [facility] cards 11/07  . Hypothyroidism   . Insomnia   . Allergic rhinitis   . Elevated BP   . Anginal pain ([place])   . Shortness of breath 05/18/2012    "at rest; lying down; w/exertion"  . Perirectal abscess 11/14/2011  . CHOLECYSTECTOMY, HX OF     Annotation: 2000 Qualifier: Diagnosis of  By: Eddie Dibbles MD, Bhakti    . History of blood transfusion 03/2008    Requried 2u prbc; menometrorrhagia, uterine fibroids.  . Anemia, iron deficiency    Current Outpatient Prescriptions  Medication Sig Dispense Refill  . acetaminophen (TYLENOL) 500 MG tablet Take 1,000 mg by mouth every 6 (six) hours as needed for mild pain.    Marland Kitchen ALPRAZolam (XANAX) 0.5 MG tablet Take 1 tablet (0.5 mg total) by mouth once as needed for anxiety or sleep. 15 tablet 0  . Elastic Bandages & Supports (MEDICAL COMPRESSION STOCKINGS) MISC 1 each by Does not apply route as directed. 1 each 0  . hydrochlorothiazide (MICROZIDE) 12.5 MG capsule Take 1 capsule (12.5 mg total) by mouth daily. 30 capsule 11  . ibuprofen (ADVIL,MOTRIN) 800 MG tablet Take 1 tablet (800 mg total) by mouth 3 (three) times daily. 21 tablet 0  . levothyroxine (SYNTHROID) 50 MCG tablet Take 1 tablet (50 mcg total) by mouth daily. 30 tablet 2  . lisinopril (PRINIVIL,ZESTRIL) 20 MG tablet Take 1 tablet (20 mg total) by mouth daily. 30 tablet 2  . nitrofurantoin, macrocrystal-monohydrate, (MACROBID) 100 MG capsule Take 1 capsule (100 mg total) by  mouth 2 (two) times daily. 10 capsule 0  . PARoxetine (PAXIL) 20 MG tablet Take 1.5 tablets (30 mg total) by mouth daily. 90 tablet 3   No current facility-administered medications for this visit.   Family History  Problem Relation Age of Onset  . Heart attack Mother     at the age of 5's  . Lung cancer Mother     metestatic, + smoker  . Aneurysm Mother   . Hyperlipidemia Mother   . Hypertension Mother   . Hypertension Father   . Heart attack Maternal Grandmother     at the age of 10's   Social History   Social History  . Marital Status: Married    Spouse Name: N/A  . Number of Children: N/A  . Years of Education: N/A   Social History Main Topics  . Smoking status: Never Smoker   . Smokeless tobacco: Never Used  . Alcohol Use: 0.0 oz/week    0 Standard drinks or equivalent per week     Comment: 05/18/2012 "have a taste of a drink maybe once/yr"  . Drug Use: No  . Sexual Activity: Not Currently   Other Topics Concern  . None   Social History Narrative   Currently unemployed. Starting college in Aug for accounting.  Married, has 2 children lives with  her youngest son.     Review of Systems: Review of Systems  Constitutional: Negative for fever, chills, weight loss and malaise/fatigue.  Eyes: Negative for blurred vision.  Respiratory: Negative for cough and shortness of breath.   Cardiovascular: Negative for chest pain.  Gastrointestinal: Negative for nausea and vomiting.  Genitourinary: Positive for frequency. Negative for dysuria, hematuria and flank pain.  Musculoskeletal: Positive for back pain and joint pain (left knee).  Neurological: Negative for headaches.  All other systems reviewed and are negative.   Objective:  Physical Exam: Filed Vitals:   11/08/15 0942  BP: 153/85  Pulse: 63  Temp: 98.3 F (36.8 C)  TempSrc: Oral  Height: 5\' 1"  (1.549 m)  Weight: 282 lb 4.8 oz (128.05 kg)  SpO2: 99%  Physical Exam  Constitutional: She is well-developed,  well-nourished, and in no distress.  Cardiovascular: Normal rate and regular rhythm.   Pulmonary/Chest: Effort normal and breath sounds normal.  Abdominal: Soft. Bowel sounds are normal. There is no tenderness.  Musculoskeletal: She exhibits edema (1+ lower extremtiy edema to knee on left, trace on right).       Lumbar back: She exhibits tenderness. She exhibits no bony tenderness.  Nursing note and vitals reviewed.   Assessment & Plan:  Case discussed with Dr. Daryll Drown  Venous (peripheral) insufficiency HPI: She is concerned about chronic left leg greater than right leg swelling. She reports this has been going on for months to years.  She has brought it up with her PCP and was recently started on low dose HCTZ, she does not feet this has helped.  She reports that the swelling resolves overnight when she is in bed and gets worse over the day, especially when washing dishes.  She has no history of DVT, no injuries or surgeries to her leg. She denies any PND.  A: Venous insufficiency of left leg  P: I discussed the nature of venous insufficiency and treatment strategies.  I have provided her with a script for 58mmHg compression stocking but I predict that this will be difficult to her apply given her obesity.  I also advised increased exercise and weight loss.  Acute cystitis without hematuria HPI: She reports 3 days of increased urinary frequency.  She has not taken anything for this.  This feels like previous UTI.  Associated symptoms of lower abdomen pressure.  She denies any fever or chills.  She has not noticed any hematuria  A: Acute cystitis   P: - Macrobid 100mg  BID x 5 days  OBESITY - Discussed importance of weight loss and exercise.  Chronic low back pain HPI: Patient reports chronic low back pain. Described as dull and achy, does not radiate.  Worse with standing long peroid.  Improved with tylenol and Ibuprofen, she takes both sparingly.  Denies any numbness or weakness.  A:  Chronic low back pain  P: -Continue Tylenol -Ibuprofen PRN - Recommended weight loss - Introduce stretching, and exercise program.   Medications Ordered Meds ordered this encounter  Medications  . Elastic Bandages & Supports (MEDICAL COMPRESSION STOCKINGS) MISC    Sig: 1 each by Does not apply route as directed.    Dispense:  1 each    Refill:  0    14mm Hg, apply to left leg  . nitrofurantoin, macrocrystal-monohydrate, (MACROBID) 100 MG capsule    Sig: Take 1 capsule (100 mg total) by mouth 2 (two) times daily.    Dispense:  10 capsule    Refill:  0  Other Orders Orders Placed This Encounter  Procedures  . POCT Urinalysis Dipstick (81002)   Follow Up: Return in about 3 months (around 02/06/2016).

## 2015-11-08 NOTE — Assessment & Plan Note (Signed)
HPI: She is concerned about chronic left leg greater than right leg swelling. She reports this has been going on for months to years.  She has brought it up with her PCP and was recently started on low dose HCTZ, she does not feet this has helped.  She reports that the swelling resolves overnight when she is in bed and gets worse over the day, especially when washing dishes.  She has no history of DVT, no injuries or surgeries to her leg. She denies any PND.  A: Venous insufficiency of left leg  P: I discussed the nature of venous insufficiency and treatment strategies.  I have provided her with a script for 49mmHg compression stocking but I predict that this will be difficult to her apply given her obesity.  I also advised increased exercise and weight loss.

## 2015-11-08 NOTE — Assessment & Plan Note (Addendum)
HPI: She reports 3 days of increased urinary frequency.  She has not taken anything for this.  This feels like previous UTI.  Associated symptoms of lower abdomen pressure.  She denies any fever or chills.  She has not noticed any hematuria  A: Acute cystitis   P: - Macrobid 100mg  BID x 5 days

## 2015-11-08 NOTE — Assessment & Plan Note (Signed)
HPI: Patient reports chronic low back pain. Described as dull and achy, does not radiate.  Worse with standing long peroid.  Improved with tylenol and Ibuprofen, she takes both sparingly.  Denies any numbness or weakness.  A: Chronic low back pain  P: -Continue Tylenol -Ibuprofen PRN - Recommended weight loss - Introduce stretching, and exercise program.

## 2015-11-13 NOTE — Progress Notes (Signed)
Internal Medicine Clinic Attending  Case discussed with Dr. Hoffman at the time of the visit.  We reviewed the resident's history and exam and pertinent patient test results.  I agree with the assessment, diagnosis, and plan of care documented in the resident's note.  

## 2015-12-09 ENCOUNTER — Encounter: Payer: Self-pay | Admitting: Internal Medicine

## 2016-01-04 ENCOUNTER — Emergency Department (HOSPITAL_COMMUNITY): Payer: Self-pay

## 2016-01-04 ENCOUNTER — Emergency Department (HOSPITAL_COMMUNITY)
Admission: EM | Admit: 2016-01-04 | Discharge: 2016-01-04 | Disposition: A | Discharge status: Home / Self Care | Payer: Self-pay | Attending: Emergency Medicine | Admitting: Emergency Medicine

## 2016-01-04 ENCOUNTER — Encounter (HOSPITAL_COMMUNITY): Payer: Self-pay

## 2016-01-04 DIAGNOSIS — Y9389 Activity, other specified: Secondary | ICD-10-CM | POA: Insufficient documentation

## 2016-01-04 DIAGNOSIS — Y9289 Other specified places as the place of occurrence of the external cause: Secondary | ICD-10-CM | POA: Insufficient documentation

## 2016-01-04 DIAGNOSIS — E669 Obesity, unspecified: Secondary | ICD-10-CM | POA: Insufficient documentation

## 2016-01-04 DIAGNOSIS — Z8679 Personal history of other diseases of the circulatory system: Secondary | ICD-10-CM | POA: Insufficient documentation

## 2016-01-04 DIAGNOSIS — E039 Hypothyroidism, unspecified: Secondary | ICD-10-CM | POA: Insufficient documentation

## 2016-01-04 DIAGNOSIS — Z8719 Personal history of other diseases of the digestive system: Secondary | ICD-10-CM | POA: Insufficient documentation

## 2016-01-04 DIAGNOSIS — G4700 Insomnia, unspecified: Secondary | ICD-10-CM | POA: Insufficient documentation

## 2016-01-04 DIAGNOSIS — S4992XA Unspecified injury of left shoulder and upper arm, initial encounter: Secondary | ICD-10-CM | POA: Insufficient documentation

## 2016-01-04 DIAGNOSIS — F410 Panic disorder [episodic paroxysmal anxiety] without agoraphobia: Secondary | ICD-10-CM | POA: Insufficient documentation

## 2016-01-04 DIAGNOSIS — Y998 Other external cause status: Secondary | ICD-10-CM | POA: Insufficient documentation

## 2016-01-04 DIAGNOSIS — W07XXXA Fall from chair, initial encounter: Secondary | ICD-10-CM | POA: Insufficient documentation

## 2016-01-04 DIAGNOSIS — M25512 Pain in left shoulder: Secondary | ICD-10-CM

## 2016-01-04 DIAGNOSIS — Z862 Personal history of diseases of the blood and blood-forming organs and certain disorders involving the immune mechanism: Secondary | ICD-10-CM | POA: Insufficient documentation

## 2016-01-04 DIAGNOSIS — Z79899 Other long term (current) drug therapy: Secondary | ICD-10-CM | POA: Insufficient documentation

## 2016-01-04 MED ORDER — IBUPROFEN 800 MG PO TABS: 800.0000 mg | ORAL_TABLET | Freq: Three times a day (TID) | ORAL | Status: DC

## 2016-01-04 MED ORDER — OXYCODONE-ACETAMINOPHEN 5-325 MG PO TABS: 2.0000 | ORAL_TABLET | ORAL | Status: DC | PRN

## 2016-01-04 NOTE — ED Provider Notes (Signed)
CSN: [id number]     Arrival date & time 01/04/16  1426 History  By signing my name below, I, [name], attest that this documentation has been prepared under the direction and in the presence of Teller, PA-C  Electronically Signed: Nicole Jensen, ED Scribe 01/04/2016 at 6:12 PM.  Chief Complaint  Patient presents with  . Shoulder Pain   The history is provided by the patient. No language interpreter was used.   HPI Comments: [name] is a 55 y.o. female who presents to the Emergency Department complaining of sudden onset, constant, left shoulder pain s/p fall yesterday in which she flipped out of a chair and landed on the shoulder. Pt denies head injury or LOC in the incident. She states the pain is worse this morning than it was yesterday. Pt reports associated swelling to the shoulder and upper arm. The pain is worsened when she moves her left arm and radiates into her arm and back. She has not taken any medication to treat her symptoms PTA. No other worsening or alleviating factors noted. Pt denies numbness, tingling, weakness, neck pain, or any other pertinent symptoms.    Past Medical History  Diagnosis Date  . Hypertriglyceridemia     164 - 2/11  . Panic disorder 11/05  . Obesity     250 lbs 8/11  . Atypical chest pain     Negative cardiolite at [facility] cards 11/07  . Hypothyroidism   . Insomnia   . Allergic rhinitis   . Elevated BP   . Anginal pain ([place])   . Shortness of breath 05/18/2012    "at rest; lying down; w/exertion"  . Perirectal abscess 11/14/2011  . CHOLECYSTECTOMY, HX OF     Annotation: 2000 Qualifier: Diagnosis of  By: Eddie Dibbles MD, Bhakti    . History of blood transfusion 03/2008    Requried 2u prbc; menometrorrhagia, uterine fibroids.  . Anemia, iron deficiency    Past Surgical History  Procedure Laterality Date  . Incision and drainage perirectal abscess  11/14/2011    Procedure: IRRIGATION AND DEBRIDEMENT PERIRECTAL ABSCESS;  Surgeon:  Zenovia Jarred, MD;  Location: [place];  Service: General;  Laterality: N/A;  . Abdominal hysterectomy  04/2007    total abdominal hysterectomy ( cervix) with bilateral salpingo-oopherectomy  . Dilation and curettage of uterus  1987  . Incision and drainage abscess  11/02/2012    Procedure: INCISION AND DRAINAGE ABSCESS;  Surgeon: Odis Hollingshead, MD;  Location: [place];  Service: General;  Laterality: N/A;  I&D anorectal abscess  . Cholecystectomy  07/30/1999    Diagnosis of  By: Eddie Dibbles MD, Bhakti    . Nerve, tendon and artery repair Left 06/11/2013    Procedure: EXPLORATION AND REPAIR LEFT FOREARM;  Surgeon: Schuyler Amor, MD;  Location: [place];  Service: Orthopedics;  Laterality: Left;   Family History  Problem Relation Age of Onset  . Heart attack Mother     at the age of 84's  . Lung cancer Mother     metestatic, + smoker  . Aneurysm Mother   . Hyperlipidemia Mother   . Hypertension Mother   . Hypertension Father   . Heart attack Maternal Grandmother     at the age of 76's   Social History  Substance Use Topics  . Smoking status: Never Smoker   . Smokeless tobacco: Never Used  . Alcohol Use: 0.0 oz/week    0 Standard drinks or equivalent per week  Comment: 05/18/2012 "have a taste of a drink maybe once/yr"   OB History    No data available     Review of Systems  Musculoskeletal: Positive for arthralgias. Negative for neck pain.       Left shoulder pain.   Skin: Negative for color change.  Neurological: Negative for weakness and numbness.   Allergies  Codeine; Doxycycline; Hydrocodone; Oxycodone; Dilaudid; and Sulfamethoxazole-trimethoprim  Home Medications   Prior to Admission medications   Medication Sig Start Date End Date Taking? Authorizing Provider  acetaminophen (TYLENOL) 500 MG tablet Take 1,000 mg by mouth every 6 (six) hours as needed for mild pain.   Yes Historical Provider, MD  ALPRAZolam Duanne Moron) 0.5 MG tablet Take 1 tablet (0.5 mg total) by mouth  once as needed for anxiety or sleep. 09/04/15 09/03/16 Yes Zada Finders, MD  hydrochlorothiazide (MICROZIDE) 12.5 MG capsule Take 1 capsule (12.5 mg total) by mouth daily. 04/12/15  Yes Zada Finders, MD  levothyroxine (SYNTHROID) 50 MCG tablet Take 1 tablet (50 mcg total) by mouth daily. 09/04/15 09/03/16 Yes Zada Finders, MD  lisinopril (PRINIVIL,ZESTRIL) 20 MG tablet Take 1 tablet (20 mg total) by mouth daily. 09/04/15  Yes Zada Finders, MD  PARoxetine (PAXIL) 20 MG tablet Take 1.5 tablets (30 mg total) by mouth daily. 09/04/15  Yes Zada Finders, MD  Elastic Bandages & Supports ([place]) [place] 1 each by Does not apply route as directed. Patient not taking: Reported on 01/04/2016 11/08/15   Lucious Groves, DO  ibuprofen (ADVIL,MOTRIN) 800 MG tablet Take 1 tablet (800 mg total) by mouth 3 (three) times daily. 01/04/16   Ozella Almond Tisa Weisel, PA-C  oxyCODONE-acetaminophen (PERCOCET/ROXICET) 5-325 MG tablet Take 2 tablets by mouth every 4 (four) hours as needed for severe pain. 01/04/16   Myelle Poteat Pilcher Taleshia Luff, PA-C   BP 127/81 mmHg  Pulse 72  Temp(Src) 98.3 F (36.8 C) (Oral)  Resp 16  SpO2 100%  LMP 06/17/2007 Physical Exam  Constitutional: She is oriented to person, place, and time. She appears well-developed and well-nourished.  HENT:  Head: Normocephalic and atraumatic.  Neck: Normal range of motion.  No midline or cervical tenderness. Full ROM without pain.   Cardiovascular: Normal rate, regular rhythm and normal heart sounds.   No murmur heard. Pulmonary/Chest: Effort normal and breath sounds normal. No respiratory distress. She has no wheezes. She has no rales.  Abdominal: Soft. She exhibits no distension. There is no tenderness.  Musculoskeletal:       Arms: Left shoulder: Decreased ROM 2/2 pain. TTP as depicted in image. No erythema, ecchymosis, swelling, or deformity appreciated. 5/5 muscle strength bilaterally. Sensation intact in axillary, radial, ulnar, and median  nerve distribution. 2+ radial pulse.   Neurological: She is alert and oriented to person, place, and time.  Skin: Skin is warm and dry.  Psychiatric: She has a normal mood and affect.  Nursing note and vitals reviewed.   ED Course  Procedures (including critical care time) DIAGNOSTIC STUDIES: Oxygen Saturation is 100% on RA, normal by my interpretation.    COORDINATION OF CARE: 4:44 PM-Discussed treatment plan which includes DG shoulder left, symptom monitoring, and follow up with orthopaedist with pt at bedside and pt agreed to plan.   Labs Review Labs Reviewed - No data to display  Imaging Review Dg Shoulder Left  01/04/2016  CLINICAL DATA:  Patient fell from a rolling chair. EXAM: LEFT SHOULDER - 2+ VIEW COMPARISON:  None. FINDINGS: Glenohumeral joint is intact. No evidence of scapular  fracture or humeral fracture. The acromioclavicular joint is intact. IMPRESSION: No fracture or dislocation. Electronically Signed   By: Suzy Bouchard M.D.   On: 01/04/2016 15:34   I have personally reviewed and evaluated these images as part of my medical decision-making.   EKG Interpretation None       MDM    Final diagnoses:  Shoulder pain, acute, left   GEORGIEANN DORMINEY presents with left shoulder pain after injury yesterday. X-ray was obtained which showed no acute abnormality. Symptomatic home care instructions were discussed including anti-inflammatories, ice, elevation. Sling was placed in ED. Patient to follow-up with orthopedics if no improvement in symptoms after 3-5 days. Patient was researched in the [place] controlled substance database-no pain medication on record-will provide short course of narcotic pain medication. Return precautions given, all questions answered.  I personally performed the services described in this documentation, which was scribed in my presence. The recorded information has been reviewed and is accurate.   [facility] Tonny Isensee, PA-C 01/04/16  1823  Lacretia Leigh, MD 01/04/16 [phone]

## 2016-01-04 NOTE — Discharge Instructions (Signed)
1. Medications: Take pain medication as directed - This can make you very drowsy - please do not drink or drive on this medication, continue usual home medications 2. Treatment: rest, drink plenty of fluids, ice affected area 3. Follow Up: Please follow up with the orthopedic clinic if no improvement in the next 4 days for discussion of your diagnoses and further evaluation after today's visit; Please return to the ER for new or worsening symptoms, any additional concerns.   COLD THERAPY DIRECTIONS:  Ice or gel packs can be used to reduce both pain and swelling. Ice is the most helpful within the first 24 to 48 hours after an injury or flareup from overusing a muscle or joint.  Ice is effective, has very few side effects, and is safe for most people to use.   If you expose your skin to cold temperatures for too long or without the proper protection, you can damage your skin or nerves. Watch for signs of skin damage due to cold.   HOME CARE INSTRUCTIONS  Follow these tips to use ice and cold packs safely.  Place a dry or damp towel between the ice and skin. A damp towel will cool the skin more quickly, so you may need to shorten the time that the ice is used.  For a more rapid response, add gentle compression to the ice.  Ice for no more than 10 to 20 minutes at a time. The bonier the area you are icing, the less time it will take to get the benefits of ice.  Check your skin after 5 minutes to make sure there are no signs of a poor response to cold or skin damage.  Rest 20 minutes or more in between uses.  Once your skin is numb, you can end your treatment. You can test numbness by very lightly touching your skin. The touch should be so light that you do not see the skin dimple from the pressure of your fingertip. When using ice, most people will feel these normal sensations in this order: cold, burning, aching, and numbness.

## 2016-01-04 NOTE — ED Notes (Signed)
Per pt, was sitting in rolling chair and flipped over and landed on left shoulder.  No head injury

## 2016-01-20 ENCOUNTER — Telehealth: Payer: Self-pay | Admitting: Internal Medicine

## 2016-01-20 NOTE — Telephone Encounter (Signed)
APPT. REMINDER CALL, LMTCB °

## 2016-01-22 ENCOUNTER — Ambulatory Visit (INDEPENDENT_AMBULATORY_CARE_PROVIDER_SITE_OTHER): Payer: Self-pay | Admitting: Internal Medicine

## 2016-01-22 VITALS — BP 110/70 | HR 87 | Temp 98.1°F | Wt 276.5 lb

## 2016-01-22 DIAGNOSIS — L298 Other pruritus: Secondary | ICD-10-CM

## 2016-01-22 DIAGNOSIS — F410 Panic disorder [episodic paroxysmal anxiety] without agoraphobia: Secondary | ICD-10-CM

## 2016-01-22 DIAGNOSIS — R0989 Other specified symptoms and signs involving the circulatory and respiratory systems: Secondary | ICD-10-CM

## 2016-01-22 DIAGNOSIS — R05 Cough: Secondary | ICD-10-CM

## 2016-01-22 DIAGNOSIS — J069 Acute upper respiratory infection, unspecified: Secondary | ICD-10-CM

## 2016-01-22 DIAGNOSIS — Z0000 Encounter for general adult medical examination without abnormal findings: Secondary | ICD-10-CM

## 2016-01-22 DIAGNOSIS — R0982 Postnasal drip: Secondary | ICD-10-CM

## 2016-01-22 DIAGNOSIS — E039 Hypothyroidism, unspecified: Secondary | ICD-10-CM

## 2016-01-22 DIAGNOSIS — I10 Essential (primary) hypertension: Secondary | ICD-10-CM

## 2016-01-22 NOTE — Assessment & Plan Note (Signed)
Patient reports her panic/anxiety disorder is well controlled now. Her Paxil was increased from 20 to 30 mg on last visit and she reports tolerating this well. She was also given Xanax 0.5 mg once daily as needed for severe panic attacks (#15 ) on last visit and says she has only needed to [place] this twice since then.  A/P: Well controlled. -Continue Paxil 30 mg daily

## 2016-01-22 NOTE — Assessment & Plan Note (Signed)
BP Readings from Last 3 Encounters:  01/22/16 142/109  01/04/16 148/89  11/08/15 153/85   Patient currently on Lisinopril 20 mg daily (added last visit) and HCTZ 12.5 mg daily. She has a BP machine at home, but has not checked in some time. BP today is 142/109 on arrival to clinic.  A/P: Repeat BP is 110/70, although difficult to assess accuracy as patient has very large arms and larger BP cuff does not provide a great fit. Will continue current management and have patient check BP at home. -Continue Lisinopril 20 mg daily -Continue HCTZ 12.5 mg daily -> consider increase to 25 mg if BP control not adequate -Check BMP -Patient advised to check BP at home 3 days a week and keep log to bring on f/u

## 2016-01-22 NOTE — Assessment & Plan Note (Signed)
Patient currently on Synthroid 50 mcg daily, she reports adherence to this. Last TSH on 04/12/2015 was 7.549. She denies any SOB, intolerance to cold, weight gain, or increased swelling in her lower extremities. She says she is tolerating Synthroid well and is not having chest tightness she experienced before on higher doses.  A/P: Patient without obvious symptomatic hypothyroidism at this time. She does have chronic lower extremity edema L > R, but no pitting edema present on exam today. -Continue Synthroid 50 mcg daily -Check TSH -If TSH elevated, consider increasing Synthroid to 75 mcg -Repeat TSH after 4-6 weeks if change in Synthroid dosing

## 2016-01-22 NOTE — Progress Notes (Signed)
**Note De-Identified Paige Obfuscation** Patient ID: [name], female   DOB: Aug 29, 1961, 55 y.o.   MRN: [id number]   Subjective:   Patient ID: [name] female   DOB: 1961/08/22 55 y.o.   MRN: [id number]  HPI: [name] is a 55 y.o. female with PMH as listed below who presents with cough and management of her HTN and hypothyroidism. Please see problem based charting for status of patient's chronic medical issues.    Past Medical History  Diagnosis Date  . Hypertriglyceridemia     164 - 2/11  . Panic disorder 11/05  . Obesity     250 lbs 8/11  . Atypical chest pain     Negative cardiolite at [facility] cards 11/07  . Hypothyroidism   . Insomnia   . Allergic rhinitis   . Elevated BP   . Anginal pain ([place])   . Shortness of breath 05/18/2012    "at rest; lying down; w/exertion"  . Perirectal abscess 11/14/2011  . CHOLECYSTECTOMY, HX OF     Annotation: 2000 Qualifier: Diagnosis of  By: Eddie Dibbles MD, Bhakti    . History of blood transfusion 03/2008    Requried 2u prbc; menometrorrhagia, uterine fibroids.  . Anemia, iron deficiency    Current Outpatient Prescriptions  Medication Sig Dispense Refill  . acetaminophen (TYLENOL) 500 MG tablet Take 1,000 mg by mouth every 6 (six) hours as needed for mild pain.    Marland Kitchen ALPRAZolam (XANAX) 0.5 MG tablet Take 1 tablet (0.5 mg total) by mouth once as needed for anxiety or sleep. 15 tablet 0  . hydrochlorothiazide (MICROZIDE) 12.5 MG capsule Take 1 capsule (12.5 mg total) by mouth daily. 30 capsule 11  . ibuprofen (ADVIL,MOTRIN) 800 MG tablet Take 1 tablet (800 mg total) by mouth 3 (three) times daily. 21 tablet 0  . levothyroxine (SYNTHROID) 50 MCG tablet Take 1 tablet (50 mcg total) by mouth daily. 30 tablet 2  . lisinopril (PRINIVIL,ZESTRIL) 20 MG tablet Take 1 tablet (20 mg total) by mouth daily. 30 tablet 2  . oxyCODONE-acetaminophen (PERCOCET/ROXICET) 5-325 MG tablet Take 2 tablets by mouth every 4 (four) hours as needed for severe pain. 15 tablet 0  . PARoxetine (PAXIL)  20 MG tablet Take 1.5 tablets (30 mg total) by mouth daily. 90 tablet 3  . Elastic Bandages & Supports (MEDICAL COMPRESSION STOCKINGS) MISC 1 each by Does not apply route as directed. (Patient not taking: Reported on 01/04/2016) 1 each 0   No current facility-administered medications for this visit.   Family History  Problem Relation Age of Onset  . Heart attack Mother     at the age of 70's  . Lung cancer Mother     metestatic, + smoker  . Aneurysm Mother   . Hyperlipidemia Mother   . Hypertension Mother   . Hypertension Father   . Heart attack Maternal Grandmother     at the age of 20's   Social History   Social History  . Marital Status: Married    Spouse Name: N/A  . Number of Children: N/A  . Years of Education: N/A   Social History Main Topics  . Smoking status: Never Smoker   . Smokeless tobacco: Never Used  . Alcohol Use: 0.0 oz/week    0 Standard drinks or equivalent per week     Comment: 05/18/2012 "have a taste of a drink maybe once/yr"  . Drug Use: No  . Sexual Activity: Not Currently   Other Topics Concern  . Not on file  Social History Narrative   Currently unemployed. Starting college in Aug for accounting.  Married, has 2 children lives with her youngest son.     Review of Systems: Review of Systems  Constitutional: Negative for chills, weight loss, malaise/fatigue and diaphoresis.       Low grade fever  HENT: Positive for congestion. Negative for ear discharge.   Eyes: Negative for redness.  Respiratory: Positive for cough. Negative for hemoptysis, shortness of breath and wheezing.        Occasional green sputum production  Cardiovascular: Negative for chest pain, palpitations and leg swelling.  Gastrointestinal: Negative for nausea, vomiting, abdominal pain, diarrhea and constipation.  Genitourinary: Negative for dysuria.  Musculoskeletal: Positive for joint pain. Negative for myalgias and falls.  Neurological: Negative for dizziness and headaches.   Psychiatric/Behavioral: The patient is not nervous/anxious.     Objective:  Physical Exam: Filed Vitals:   01/22/16 1324  BP: 142/109  Pulse: 87  Temp: 98.1 F (36.7 C)  TempSrc: Oral  Weight: 276 lb 8 oz (125.42 kg)  SpO2: 99%   Physical Exam  Constitutional: She is oriented to person, place, and time. She appears well-developed and well-nourished.  Obese woman, sitting in chair, pleasant  HENT:  Head: Normocephalic and atraumatic.  Right Ear: Tympanic membrane normal. No middle ear effusion.  Left Ear: Tympanic membrane normal.  No middle ear effusion.  Mouth/Throat: Oropharynx is clear and moist. No oropharyngeal exudate.  Cardiovascular: Normal rate and regular rhythm.   No murmur heard. Pulmonary/Chest: Effort normal. No respiratory distress. She has no wheezes. She has no rales. She exhibits no tenderness.  Musculoskeletal: Normal range of motion. She exhibits no edema or tenderness.  Neurological: She is alert and oriented to person, place, and time.  Skin: Skin is warm.  Psychiatric: She has a normal mood and affect.    Assessment & Plan:  Please see problem list for assessment and plan.

## 2016-01-22 NOTE — Assessment & Plan Note (Signed)
Patient reports 3 day history of mostly dry cough with occasional green sputum production. She has associated bilateral ear pruritis, congestion, and post-nasal drip symptoms. She reports her daughter had similar symptoms. She had one low-grade fever of 100.3 at home which improved with tylenol. She has tried cough drops with some relief. She denies any CP, SOB, N/V/D, chills, diaphoresis, ear drainage.  A/P: Lung exam is clear. Bilateral tympanic membranes are without erythema or effusion. No purulent material seen within ear canals. Patient likely has a viral URI. She is advised on symptomatic treatment. Patient prefers to try OTC medication as this will be cheaper for her. -Advised patient to try OTC medication such as Mucinex

## 2016-01-22 NOTE — Assessment & Plan Note (Signed)
Patient eligible for Hep C screening and agrees to have this checked today. Patient also previously referred for Mammogram, but this was unable to be arranged.  A/P: -Check Hep C antibody -Arrange for mammogram

## 2016-01-22 NOTE — Patient Instructions (Signed)
It was a pleasure to see you again [name]. Your cough is likely from a viral upper respiratory infection. You can try over the counter cough medicine and ear drops for this.   We will check some blood work today for your thyroid function and Hepatitis C screening.  We will also try to schedule your Mammogram.  Please check your blood pressure at home 3 times per week and keep a log of your readings to bring next clinic visit.   Upper Respiratory Infection, Adult Most upper respiratory infections (URIs) are a viral infection of the air passages leading to the lungs. A URI affects the nose, throat, and upper air passages. The most common type of URI is nasopharyngitis and is typically referred to as "the common cold." URIs run their course and usually go away on their own. Most of the time, a URI does not require medical attention, but sometimes a bacterial infection in the upper airways can follow a viral infection. This is called a secondary infection. Sinus and middle ear infections are common types of secondary upper respiratory infections. Bacterial pneumonia can also complicate a URI. A URI can worsen asthma and chronic obstructive pulmonary disease (COPD). Sometimes, these complications can require emergency medical care and may be life threatening.  CAUSES Almost all URIs are caused by viruses. A virus is a type of germ and can spread from one person to another.  RISKS FACTORS You may be at risk for a URI if:   You smoke.   You have chronic heart or lung disease.  You have a weakened defense (immune) system.   You are very young or very old.   You have nasal allergies or asthma.  You work in crowded or poorly ventilated areas.  You work in health care facilities or schools. SIGNS AND SYMPTOMS  Symptoms typically develop 2-3 days after you come in contact with a cold virus. Most viral URIs last 7-10 days. However, viral URIs from the influenza virus (flu virus) can last  14-18 days and are typically more severe. Symptoms may include:   Runny or stuffy (congested) nose.   Sneezing.   Cough.   Sore throat.   Headache.   Fatigue.   Fever.   Loss of appetite.   Pain in your forehead, behind your eyes, and over your cheekbones (sinus pain).  Muscle aches.  DIAGNOSIS  Your health care provider may diagnose a URI by:  Physical exam.  Tests to check that your symptoms are not due to another condition such as:  Strep throat.  Sinusitis.  Pneumonia.  Asthma. TREATMENT  A URI goes away on its own with time. It cannot be cured with medicines, but medicines may be prescribed or recommended to relieve symptoms. Medicines may help:  Reduce your fever.  Reduce your cough.  Relieve nasal congestion. HOME CARE INSTRUCTIONS   Take medicines only as directed by your health care provider.   Gargle warm saltwater or take cough drops to comfort your throat as directed by your health care provider.  Use a warm mist humidifier or inhale steam from a shower to increase air moisture. This may make it easier to breathe.  Drink enough fluid to keep your urine clear or pale yellow.   Eat soups and other clear broths and maintain good nutrition.   Rest as needed.   Return to work when your temperature has returned to normal or as your health care provider advises. You may need to stay home longer  to avoid infecting others. You can also use a face mask and careful hand washing to prevent spread of the virus.  Increase the usage of your inhaler if you have asthma.   Do not use any tobacco products, including cigarettes, chewing tobacco, or electronic cigarettes. If you need help quitting, ask your health care provider. PREVENTION  The best way to protect yourself from getting a cold is to practice good hygiene.   Avoid oral or hand contact with people with cold symptoms.   Wash your hands often if contact occurs.  There is no clear  evidence that vitamin C, vitamin E, echinacea, or exercise reduces the chance of developing a cold. However, it is always recommended to get plenty of rest, exercise, and practice good nutrition.  SEEK MEDICAL CARE IF:   You are getting worse rather than better.   Your symptoms are not controlled by medicine.   You have chills.  You have worsening shortness of breath.  You have brown or red mucus.  You have yellow or brown nasal discharge.  You have pain in your face, especially when you bend forward.  You have a fever.  You have swollen neck glands.  You have pain while swallowing.  You have white areas in the back of your throat. SEEK IMMEDIATE MEDICAL CARE IF:   You have severe or persistent:  Headache.  Ear pain.  Sinus pain.  Chest pain.  You have chronic lung disease and any of the following:  Wheezing.  Prolonged cough.  Coughing up blood.  A change in your usual mucus.  You have a stiff neck.  You have changes in your:  Vision.  Hearing.  Thinking.  Mood. MAKE SURE YOU:   Understand these instructions.  Will watch your condition.  Will get help right away if you are not doing well or get worse.   This information is not intended to replace advice given to you by your health care provider. Make sure you discuss any questions you have with your health care provider.   Document Released: 03/24/2001 Document Revised: 02/12/2015 Document Reviewed: 01/03/2014 Elsevier Interactive Patient Education [employer].

## 2016-01-23 LAB — BMP8+ANION GAP
Anion Gap: 18 mmol/L (ref 10.0–18.0)
BUN / CREAT RATIO: 15 (ref 9–23)
BUN: 18 mg/dL (ref 6–24)
CO2: 23 mmol/L (ref 18–29)
Calcium: 9.3 mg/dL (ref 8.7–10.2)
Chloride: 99 mmol/L (ref 96–106)
Creatinine, Ser: 1.24 mg/dL — ABNORMAL HIGH (ref 0.57–1.00)
GFR calc non Af Amer: 50 mL/min/{1.73_m2} — ABNORMAL LOW (ref >59)
GFR, EST AFRICAN AMERICAN: 58 mL/min/{1.73_m2} — AB (ref >59)
GLUCOSE: 102 mg/dL — AB (ref 65–99)
POTASSIUM: 4.3 mmol/L (ref 3.5–5.2)
Sodium: 140 mmol/L (ref 134–144)

## 2016-01-23 LAB — HEPATITIS C ANTIBODY: Hep C Virus Ab: <0.1 s/co ratio (ref 0.0–0.9)

## 2016-01-23 LAB — TSH: TSH: 4.46 u[IU]/mL (ref 0.450–4.500)

## 2016-01-27 NOTE — Progress Notes (Signed)
Case discussed with Dr. Patel at the time of the visit.  We reviewed the resident's history and exam and pertinent patient test results.  I agree with the assessment, diagnosis, and plan of care documented in the resident's note. 

## 2016-02-13 ENCOUNTER — Other Ambulatory Visit (HOSPITAL_COMMUNITY): Payer: Self-pay | Admitting: Orthopaedic Surgery

## 2016-02-13 DIAGNOSIS — M25512 Pain in left shoulder: Secondary | ICD-10-CM

## 2016-02-20 ENCOUNTER — Ambulatory Visit (HOSPITAL_COMMUNITY)
Admission: RE | Admit: 2016-02-20 | Discharge: 2016-02-20 | Disposition: A | Discharge status: Home / Self Care | Payer: Self-pay | Source: Ambulatory Visit | Attending: Orthopaedic Surgery | Admitting: Orthopaedic Surgery

## 2016-02-20 DIAGNOSIS — X58XXXA Exposure to other specified factors, initial encounter: Secondary | ICD-10-CM | POA: Insufficient documentation

## 2016-02-20 DIAGNOSIS — M25512 Pain in left shoulder: Secondary | ICD-10-CM | POA: Insufficient documentation

## 2016-02-20 DIAGNOSIS — S46812A Strain of other muscles, fascia and tendons at shoulder and upper arm level, left arm, initial encounter: Secondary | ICD-10-CM | POA: Insufficient documentation

## 2016-02-28 ENCOUNTER — Other Ambulatory Visit: Payer: Self-pay | Admitting: Orthopaedic Surgery

## 2016-03-05 ENCOUNTER — Encounter (HOSPITAL_BASED_OUTPATIENT_CLINIC_OR_DEPARTMENT_OTHER): Payer: Self-pay | Admitting: *Deleted

## 2016-03-05 MED ORDER — DEXTROSE 5 % IV SOLN
3.0000 g | INTRAVENOUS | Status: AC
  Administered 2016-03-06: 3 g via INTRAVENOUS
  Filled 2016-03-05: qty 3000

## 2016-03-06 ENCOUNTER — Encounter (HOSPITAL_COMMUNITY)
Admission: AD | Disposition: A | Discharge status: Home / Self Care | Payer: Self-pay | Source: Ambulatory Visit | Attending: Orthopaedic Surgery

## 2016-03-06 ENCOUNTER — Encounter (HOSPITAL_COMMUNITY): Payer: Self-pay | Admitting: Anesthesiology

## 2016-03-06 ENCOUNTER — Ambulatory Visit (HOSPITAL_COMMUNITY): Payer: Self-pay | Admitting: Anesthesiology

## 2016-03-06 ENCOUNTER — Ambulatory Visit (HOSPITAL_COMMUNITY)
Admission: AD | Admit: 2016-03-06 | Discharge: 2016-03-06 | Disposition: A | Discharge status: Home / Self Care | Payer: Self-pay | Source: Ambulatory Visit | Attending: Orthopaedic Surgery | Admitting: Orthopaedic Surgery

## 2016-03-06 DIAGNOSIS — M7542 Impingement syndrome of left shoulder: Secondary | ICD-10-CM | POA: Insufficient documentation

## 2016-03-06 DIAGNOSIS — I10 Essential (primary) hypertension: Secondary | ICD-10-CM | POA: Insufficient documentation

## 2016-03-06 DIAGNOSIS — Z79899 Other long term (current) drug therapy: Secondary | ICD-10-CM | POA: Insufficient documentation

## 2016-03-06 DIAGNOSIS — E039 Hypothyroidism, unspecified: Secondary | ICD-10-CM | POA: Insufficient documentation

## 2016-03-06 DIAGNOSIS — Z6841 Body Mass Index (BMI) 40.0 and over, adult: Secondary | ICD-10-CM | POA: Insufficient documentation

## 2016-03-06 DIAGNOSIS — M19012 Primary osteoarthritis, left shoulder: Secondary | ICD-10-CM | POA: Insufficient documentation

## 2016-03-06 DIAGNOSIS — M75102 Unspecified rotator cuff tear or rupture of left shoulder, not specified as traumatic: Secondary | ICD-10-CM | POA: Insufficient documentation

## 2016-03-06 HISTORY — DX: Essential (primary) hypertension: I10

## 2016-03-06 HISTORY — DX: Unspecified osteoarthritis, unspecified site: M19.90

## 2016-03-06 HISTORY — PX: SHOULDER ARTHROSCOPY WITH ROTATOR CUFF REPAIR AND SUBACROMIAL DECOMPRESSION: SHX5686

## 2016-03-06 LAB — BASIC METABOLIC PANEL
Anion gap: 8 (ref 5–15)
BUN: 30 mg/dL — ABNORMAL HIGH (ref 6–20)
CHLORIDE: 102 mmol/L (ref 101–111)
CO2: 27 mmol/L (ref 22–32)
CREATININE: 1.21 mg/dL — AB (ref 0.44–1.00)
Calcium: 9.6 mg/dL (ref 8.9–10.3)
GFR calc non Af Amer: 51 mL/min — ABNORMAL LOW (ref >60)
GFR, EST AFRICAN AMERICAN: 59 mL/min — AB (ref >60)
GLUCOSE: 116 mg/dL — AB (ref 65–99)
Potassium: 4 mmol/L (ref 3.5–5.1)
Sodium: 137 mmol/L (ref 135–145)

## 2016-03-06 LAB — CBC
HCT: 40 % (ref 36.0–46.0)
Hemoglobin: 13.1 g/dL (ref 12.0–15.0)
MCH: 29.8 pg (ref 26.0–34.0)
MCHC: 32.8 g/dL (ref 30.0–36.0)
MCV: 90.9 fL (ref 78.0–100.0)
PLATELETS: 253 10*3/uL (ref 150–400)
RBC: 4.4 MIL/uL (ref 3.87–5.11)
RDW: 13.5 % (ref 11.5–15.5)
WBC: 8.1 10*3/uL (ref 4.0–10.5)

## 2016-03-06 LAB — SURGICAL PCR SCREEN
MRSA, PCR: POSITIVE — AB
Staphylococcus aureus: POSITIVE — AB

## 2016-03-06 SURGERY — SHOULDER ARTHROSCOPY WITH ROTATOR CUFF REPAIR AND SUBACROMIAL DECOMPRESSION
Anesthesia: Regional | Site: Shoulder | Laterality: Left

## 2016-03-06 MED ORDER — SENNOSIDES-DOCUSATE SODIUM 8.6-50 MG PO TABS: 1.0000 | ORAL_TABLET | Freq: Every evening | ORAL | Status: DC | PRN

## 2016-03-06 MED ORDER — METHOCARBAMOL 750 MG PO TABS: 750.0000 mg | ORAL_TABLET | Freq: Two times a day (BID) | ORAL | Status: DC | PRN

## 2016-03-06 MED ORDER — MUPIROCIN 2 % EX OINT
1.0000 "application " | TOPICAL_OINTMENT | Freq: Once | CUTANEOUS | Status: AC
  Administered 2016-03-06: 1 via TOPICAL

## 2016-03-06 MED ORDER — OXYCODONE HCL ER 10 MG PO T12A: 10.0000 mg | EXTENDED_RELEASE_TABLET | Freq: Two times a day (BID) | ORAL | Status: DC

## 2016-03-06 MED ORDER — BUPIVACAINE-EPINEPHRINE (PF) 0.5% -1:200000 IJ SOLN
INTRAMUSCULAR | Status: DC | PRN
  Administered 2016-03-06: 30 mL via PERINEURAL

## 2016-03-06 MED ORDER — MIDAZOLAM HCL 2 MG/2ML IJ SOLN
INTRAMUSCULAR | Status: AC
  Filled 2016-03-06: qty 2

## 2016-03-06 MED ORDER — METOCLOPRAMIDE HCL 5 MG/ML IJ SOLN: 10.0000 mg | Freq: Once | INTRAMUSCULAR | Status: DC | PRN

## 2016-03-06 MED ORDER — NEOSTIGMINE METHYLSULFATE 10 MG/10ML IV SOLN
INTRAVENOUS | Status: DC | PRN
  Administered 2016-03-06: 4 mg via INTRAVENOUS

## 2016-03-06 MED ORDER — SODIUM CHLORIDE 0.9 % IR SOLN
Status: DC | PRN
  Administered 2016-03-06 (×2): 6000 mL
  Administered 2016-03-06 (×3): 3000 mL

## 2016-03-06 MED ORDER — MEPERIDINE HCL 25 MG/ML IJ SOLN: 6.2500 mg | INTRAMUSCULAR | Status: DC | PRN

## 2016-03-06 MED ORDER — PROPOFOL 10 MG/ML IV BOLUS
INTRAVENOUS | Status: DC | PRN
  Administered 2016-03-06: 30 mg via INTRAVENOUS
  Administered 2016-03-06: 200 mg via INTRAVENOUS

## 2016-03-06 MED ORDER — MUPIROCIN 2 % EX OINT
TOPICAL_OINTMENT | CUTANEOUS | Status: AC
  Filled 2016-03-06: qty 22

## 2016-03-06 MED ORDER — FENTANYL CITRATE (PF) 100 MCG/2ML IJ SOLN
INTRAMUSCULAR | Status: DC | PRN
  Administered 2016-03-06 (×2): 100 ug via INTRAVENOUS

## 2016-03-06 MED ORDER — LIDOCAINE HCL (CARDIAC) 20 MG/ML IV SOLN
INTRAVENOUS | Status: DC | PRN
  Administered 2016-03-06: 80 mg via INTRAVENOUS

## 2016-03-06 MED ORDER — DEXTROSE 5 % IV SOLN
10.0000 mg | INTRAVENOUS | Status: DC | PRN
  Administered 2016-03-06: 40 ug/min via INTRAVENOUS

## 2016-03-06 MED ORDER — GLYCOPYRROLATE 0.2 MG/ML IJ SOLN
INTRAMUSCULAR | Status: DC | PRN
  Administered 2016-03-06: 0.6 mg via INTRAVENOUS

## 2016-03-06 MED ORDER — BUPIVACAINE-EPINEPHRINE (PF) 0.25% -1:200000 IJ SOLN
INTRAMUSCULAR | Status: AC
  Filled 2016-03-06: qty 30

## 2016-03-06 MED ORDER — ONDANSETRON HCL 4 MG PO TABS: 4.0000 mg | ORAL_TABLET | Freq: Three times a day (TID) | ORAL | Status: DC | PRN

## 2016-03-06 MED ORDER — ROCURONIUM BROMIDE 100 MG/10ML IV SOLN
INTRAVENOUS | Status: DC | PRN
  Administered 2016-03-06: 50 mg via INTRAVENOUS

## 2016-03-06 MED ORDER — LACTATED RINGERS IV SOLN
INTRAVENOUS | Status: DC
  Administered 2016-03-06 (×2): via INTRAVENOUS

## 2016-03-06 MED ORDER — MIDAZOLAM HCL 2 MG/2ML IJ SOLN
2.0000 mg | Freq: Once | INTRAMUSCULAR | Status: AC
  Administered 2016-03-06: 2 mg via INTRAVENOUS

## 2016-03-06 MED ORDER — FENTANYL CITRATE (PF) 250 MCG/5ML IJ SOLN
INTRAMUSCULAR | Status: AC
  Filled 2016-03-06: qty 5

## 2016-03-06 MED ORDER — FENTANYL CITRATE (PF) 100 MCG/2ML IJ SOLN
INTRAMUSCULAR | Status: AC
  Filled 2016-03-06: qty 2

## 2016-03-06 MED ORDER — EPHEDRINE SULFATE 50 MG/ML IJ SOLN
INTRAMUSCULAR | Status: DC | PRN
  Administered 2016-03-06: 10 mg via INTRAVENOUS

## 2016-03-06 MED ORDER — OXYCODONE HCL 5 MG PO TABS: 5.0000 mg | ORAL_TABLET | ORAL | Status: DC | PRN

## 2016-03-06 MED ORDER — HYDROCODONE-ACETAMINOPHEN 7.5-325 MG PO TABS: 1.0000 | ORAL_TABLET | Freq: Once | ORAL | Status: DC | PRN

## 2016-03-06 MED ORDER — ONDANSETRON HCL 4 MG/2ML IJ SOLN
INTRAMUSCULAR | Status: DC | PRN
  Administered 2016-03-06: 4 mg via INTRAVENOUS

## 2016-03-06 MED ORDER — FENTANYL CITRATE (PF) 100 MCG/2ML IJ SOLN: 25.0000 ug | INTRAMUSCULAR | Status: DC | PRN

## 2016-03-06 MED ORDER — FENTANYL CITRATE (PF) 100 MCG/2ML IJ SOLN
100.0000 ug | Freq: Once | INTRAMUSCULAR | Status: AC
  Administered 2016-03-06: 100 ug via INTRAVENOUS

## 2016-03-06 SURGICAL SUPPLY — 43 items
ANCHOR SUT BIO SW 4.75X19.1 (Anchor) ×3 IMPLANT
BLADE GREAT WHITE 4.2 (BLADE) ×2 IMPLANT
BLADE GREAT WHITE 4.2MM (BLADE) ×1
BLADE SURG 11 STRL SS (BLADE) ×3 IMPLANT
BOOTCOVER CLEANROOM LRG (PROTECTIVE WEAR) ×3 IMPLANT
BUR OVAL 4.0 (BURR) ×3 IMPLANT
CANNULA 5.75X7 CRYSTAL CLEAR (CANNULA) ×3 IMPLANT
CANNULA TWIST IN 6X9CM RED TRO (CANNULA) ×3 IMPLANT
CANNULA TWIST IN 8.25X7CM (CANNULA) ×3 IMPLANT
DRAPE INCISE IOBAN 66X45 STRL (DRAPES) ×6 IMPLANT
DRAPE STERI 35X30 U-POUCH (DRAPES) ×3 IMPLANT
DRAPE U-SHAPE 47X51 STRL (DRAPES) ×3 IMPLANT
DRSG PAD ABDOMINAL 8X10 ST (GAUZE/BANDAGES/DRESSINGS) ×9 IMPLANT
DURAPREP 26ML APPLICATOR (WOUND CARE) ×6 IMPLANT
GAUZE SPONGE 4X4 12PLY STRL (GAUZE/BANDAGES/DRESSINGS) ×3 IMPLANT
GAUZE XEROFORM 1X8 LF (GAUZE/BANDAGES/DRESSINGS) ×3 IMPLANT
GLOVE SKINSENSE NS SZ7.5 (GLOVE) ×4
GLOVE SKINSENSE STRL SZ7.5 (GLOVE) ×2 IMPLANT
GOWN STRL REIN XL XLG (GOWN DISPOSABLE) ×3 IMPLANT
KIT BASIN OR (CUSTOM PROCEDURE TRAY) ×3 IMPLANT
KIT ROOM TURNOVER OR (KITS) ×3 IMPLANT
KIT SHOULDER TRACTION (DRAPES) ×3 IMPLANT
MANIFOLD NEPTUNE II (INSTRUMENTS) ×3 IMPLANT
NEEDLE SCORPION MULTI FIRE (NEEDLE) ×3 IMPLANT
NEEDLE SPNL 18GX3.5 QUINCKE PK (NEEDLE) ×3 IMPLANT
NS IRRIG 1000ML POUR BTL (IV SOLUTION) ×3 IMPLANT
PACK SHOULDER (CUSTOM PROCEDURE TRAY) ×3 IMPLANT
PAD ARMBOARD 7.5X6 YLW CONV (MISCELLANEOUS) ×6 IMPLANT
SET ARTHROSCOPY TUBING (MISCELLANEOUS) ×2
SET ARTHROSCOPY TUBING LN (MISCELLANEOUS) ×1 IMPLANT
SLING ARM FOAM STRAP LRG (SOFTGOODS) IMPLANT
SLING ARM IMMOBILIZER XL (CAST SUPPLIES) ×3 IMPLANT
SLING ARM MED ADULT FOAM STRAP (SOFTGOODS) IMPLANT
SPONGE LAP 18X18 X RAY DECT (DISPOSABLE) ×3 IMPLANT
SPONGE LAP 4X18 X RAY DECT (DISPOSABLE) ×3 IMPLANT
SUT ETHILON 3 0 PS 1 (SUTURE) ×3 IMPLANT
SUT TIGER TAPE 7 IN WHITE (SUTURE) IMPLANT
TAPE FIBER 2MM 7IN #2 BLUE (SUTURE) ×3 IMPLANT
TAPE PAPER 3X10 WHT MICROPORE (GAUZE/BANDAGES/DRESSINGS) IMPLANT
TOWEL OR 17X24 6PK STRL BLUE (TOWEL DISPOSABLE) ×3 IMPLANT
TOWEL OR 17X26 10 PK STRL BLUE (TOWEL DISPOSABLE) ×3 IMPLANT
WAND HAND CNTRL MULTIVAC 90 (MISCELLANEOUS) ×3 IMPLANT
WATER STERILE IRR 1000ML POUR (IV SOLUTION) ×3 IMPLANT

## 2016-03-06 NOTE — Discharge Instructions (Signed)
Postoperative instructions:  Weightbearing: non weight bearing  Keep your dressing and/or splint clean and dry at all times.  You can remove your dressing on post-operative day #3 and change with a dry/sterile dressing or Band-Aids as needed thereafter.    Incision instructions:  Do not soak your incision for 3 weeks after surgery.  If the incision gets wet, pat dry and do not scrub the incision.  Pain control:  You have been given a prescription to be taken as directed for post-operative pain control.  In addition, elevate the operative extremity above the heart at all times to prevent swelling and throbbing pain.  Take over-the-counter Colace, 100mg  by mouth twice a day while taking narcotic pain medications to help prevent constipation.  Follow up appointments: 1) 10-14 days for suture removal and wound check. 2) Dr. Erlinda Hong as scheduled.   -------------------------------------------------------------------------------------------------------------  After Surgery Pain Control:  After your surgery, post-surgical discomfort or pain is likely. This discomfort can last several days to a few weeks. At certain times of the day your discomfort may be more intense.  Did you receive a nerve block?  A nerve block can provide pain relief for one hour to two days after your surgery. As long as the nerve block is working, you will experience little or no sensation in the area the surgeon operated on.  As the nerve block wears off, you will begin to experience pain or discomfort. It is very important that you begin taking your prescribed pain medication before the nerve block fully wears off. Treating your pain at the first sign of the block wearing off will ensure your pain is better controlled and more tolerable when full-sensation returns. Do not wait until the pain is intolerable, as the medicine will be less effective. It is better to treat pain in advance than to try and catch up.  General Anesthesia:    If you did not receive a nerve block during your surgery, you will need to start taking your pain medication shortly after your surgery and should continue to do so as prescribed by your surgeon.  Pain Medication:  Most commonly we prescribe Vicodin and Percocet for post-operative pain. Both of these medications contain a combination of acetaminophen (Tylenol) and a narcotic to help control pain.   It takes between 30 and 45 minutes before pain medication starts to work. It is important to take your medication before your pain level gets too intense.   Nausea is a common side effect of many pain medications. You will want to eat something before taking your pain medicine to help prevent nausea.   If you are taking a prescription pain medication that contains acetaminophen, we recommend that you do not take additional over the counter acetaminophen (Tylenol).  Other pain relieving options:   Using a cold pack to ice the affected area a few times a day (15 to 20 minutes at a time) can help to relieve pain, reduce swelling and bruising.   Elevation of the affected area can also help to reduce pain and swelling.

## 2016-03-06 NOTE — Anesthesia Procedure Notes (Addendum)
Anesthesia Regional Block:  Interscalene brachial plexus block  Pre-Anesthetic Checklist: ,, timeout performed, Correct Patient, Correct Site, Correct Laterality, Correct Procedure, Correct Position, site marked, Risks and benefits discussed,  Surgical consent,  Pre-op evaluation,  At surgeon's request and post-op pain management  Laterality: Left and Upper  Prep: chloraprep       Needles:  Injection technique: Single-shot  Needle Type: Echogenic Stimulator Needle     Needle Length: 9cm 9 cm Needle Gauge: 21 and 21 G    Additional Needles: Interscalene brachial plexus block Narrative:  Injection made incrementally with aspirations every 5 mL.  Performed by: Personally  Anesthesiologist: Josephine Igo  Additional Notes: Relevant anatomy ID'd. Incremental 12ml injection with frequent aspiration. Patient tolerated procedure well.    Procedure Name: Intubation Date/Time: 03/06/2016 10:41 AM Performed by: Terrill Mohr Pre-anesthesia Checklist: Patient identified, Emergency Drugs available, Suction available and Patient being monitored Patient Re-evaluated:Patient Re-evaluated prior to inductionOxygen Delivery Method: Circle system utilized Preoxygenation: Pre-oxygenation with 100% oxygen Intubation Type: IV induction Ventilation: Oral airway inserted - appropriate to patient size and Mask ventilation with difficulty Laryngoscope Size: Mac and 3 Grade View: Grade II Tube type: Oral Tube size: 7.5 mm Number of attempts: 1 Airway Equipment and Method: Stylet Placement Confirmation: ETT inserted through vocal cords under direct vision,  positive ETCO2 and breath sounds checked- equal and bilateral Secured at: 21 (cm at gum) cm Tube secured with: Tape Dental Injury: Teeth and Oropharynx as per pre-operative assessment

## 2016-03-06 NOTE — H&P (Signed)
PREOPERATIVE H&P  Chief Complaint: left shoulder rotator cuff tear  HPI: [name] is a 55 y.o. female who presents for surgical treatment of left shoulder rotator cuff tear.  She denies any changes in medical history.  Past Medical History  Diagnosis Date  . Hypertriglyceridemia     164 - 2/11  . Panic disorder 11/05  . Obesity     250 lbs 8/11  . Atypical chest pain     Negative cardiolite at [facility] cards 11/07  . Hypothyroidism   . Insomnia   . Allergic rhinitis   . Elevated BP   . Perirectal abscess 11/14/2011  . CHOLECYSTECTOMY, HX OF     Annotation: 2000 Qualifier: Diagnosis of  By: Eddie Dibbles MD, Bhakti    . History of blood transfusion 03/2008    Requried 2u prbc; menometrorrhagia, uterine fibroids.  . Anemia, iron deficiency   . Hypertension   . Anginal pain [facility])     last cardiologist visit 2010 - dx/ed with anxiety instead of cardiac pain  . Shortness of breath 05/18/2012    w/exertion"  . Arthritis    Past Surgical History  Procedure Laterality Date  . Incision and drainage perirectal abscess  11/14/2011    Procedure: IRRIGATION AND DEBRIDEMENT PERIRECTAL ABSCESS;  Surgeon: Zenovia Jarred, MD;  Location: [place];  Service: General;  Laterality: N/A;  . Abdominal hysterectomy  04/2007    total abdominal hysterectomy ( cervix) with bilateral salpingo-oopherectomy  . Dilation and curettage of uterus  1987  . Incision and drainage abscess  11/02/2012    Procedure: INCISION AND DRAINAGE ABSCESS;  Surgeon: Odis Hollingshead, MD;  Location: [place];  Service: General;  Laterality: N/A;  I&D anorectal abscess  . Cholecystectomy  07/30/1999    Diagnosis of  By: Eddie Dibbles MD, Bhakti    . Nerve, tendon and artery repair Left 06/11/2013    Procedure: EXPLORATION AND REPAIR LEFT FOREARM;  Surgeon: Schuyler Amor, MD;  Location: [place];  Service: Orthopedics;  Laterality: Left;   Social History   Social History  . Marital Status: Married    Spouse Name: N/A  . Number of  Children: N/A  . Years of Education: N/A   Social History Main Topics  . Smoking status: Never Smoker   . Smokeless tobacco: Never Used  . Alcohol Use: 0.0 oz/week    0 Standard drinks or equivalent per week     Comment: 05/18/2012 "have a taste of a drink maybe once/yr"  . Drug Use: No  . Sexual Activity: Not Currently   Other Topics Concern  . None   Social History Narrative   Currently unemployed. Starting college in Aug for accounting.  Married, has 2 children lives with her youngest son.     Family History  Problem Relation Age of Onset  . Heart attack Mother     at the age of 28's  . Lung cancer Mother     metestatic, + smoker  . Aneurysm Mother   . Hyperlipidemia Mother   . Hypertension Mother   . Hypertension Father   . Heart attack Maternal Grandmother     at the age of 9's   Allergies  Allergen Reactions  . Doxycycline Nausea And Vomiting  . Dilaudid [Hydromorphone Hcl] Other (See Comments)    Panic attack  . Sulfamethoxazole-Trimethoprim Nausea And Vomiting   Prior to Admission medications   Medication Sig Start Date End Date Taking? Authorizing Provider  acetaminophen (TYLENOL) 500 MG  tablet Take 1,000 mg by mouth every 6 (six) hours as needed for mild pain.   Yes Historical Provider, MD  ALPRAZolam Duanne Moron) 0.5 MG tablet Take 1 tablet (0.5 mg total) by mouth once as needed for anxiety or sleep. 09/04/15 09/03/16 Yes Zada Finders, MD  Elastic Bandages & Supports ([place]) [place] 1 each by Does not apply route as directed. 11/08/15  Yes Lucious Groves, DO  hydrochlorothiazide (MICROZIDE) 12.5 MG capsule Take 1 capsule (12.5 mg total) by mouth daily. Patient taking differently: Take 12.5 mg by mouth at bedtime.  04/12/15  Yes Zada Finders, MD  ibuprofen (ADVIL,MOTRIN) 800 MG tablet Take 1 tablet (800 mg total) by mouth 3 (three) times daily. Patient taking differently: Take 800 mg by mouth every 8 (eight) hours as needed (For pain.).  01/04/16   Yes Jaime Pilcher Ward, PA-C  levothyroxine (SYNTHROID) 50 MCG tablet Take 1 tablet (50 mcg total) by mouth daily. Patient taking differently: Take 50 mcg by mouth at bedtime.  09/04/15 09/03/16 Yes Zada Finders, MD  lisinopril (PRINIVIL,ZESTRIL) 20 MG tablet Take 1 tablet (20 mg total) by mouth daily. Patient taking differently: Take 20 mg by mouth at bedtime.  09/04/15  Yes Zada Finders, MD  PARoxetine (PAXIL) 20 MG tablet Take 1.5 tablets (30 mg total) by mouth daily. Patient taking differently: Take 30 mg by mouth at bedtime.  09/04/15  Yes Zada Finders, MD     Positive ROS: All other systems have been reviewed and were otherwise negative with the exception of those mentioned in the HPI and as above.  Physical Exam: General: Alert, no acute distress Cardiovascular: No pedal edema Respiratory: No cyanosis, no use of accessory musculature GI: abdomen soft Skin: No lesions in the area of chief complaint Neurologic: Sensation intact distally Psychiatric: Patient is competent for consent with normal mood and affect Lymphatic: no lymphedema  MUSCULOSKELETAL: exam stable  Assessment: left shoulder rotator cuff tear  Plan: Plan for Procedure(s): LEFT SHOULDER ARTHROSCOPY WITH ROTATOR CUFF REPAIR AND SUBACROMIAL DECOMPRESSION, DISTAL CLAVICLE EXCISION  The risks benefits and alternatives were discussed with the patient including but not limited to the risks of nonoperative treatment, versus surgical intervention including infection, bleeding, nerve injury,  blood clots, cardiopulmonary complications, morbidity, mortality, among others, and they were willing to proceed.   Marianna Payment, MD   03/06/2016 6:42 AM

## 2016-03-06 NOTE — Anesthesia Preprocedure Evaluation (Addendum)
Anesthesia Evaluation  Patient identified by MRN, date of birth, ID band Patient awake    Reviewed: Allergy & Precautions, NPO status , Patient's Chart, lab work & pertinent test results  Airway Mallampati: III  TM Distance: >3 FB Neck ROM: Full    Dental  (+) Upper Dentures, Missing, Partial Lower   Pulmonary shortness of breath and with exertion,    Pulmonary exam normal breath sounds clear to auscultation       Cardiovascular hypertension, Pt. on medications + angina with exertion + Peripheral Vascular Disease  Normal cardiovascular exam Rhythm:Regular Rate:Normal  EKG- Normal   Neuro/Psych PSYCHIATRIC DISORDERS Anxiety negative neurological ROS     GI/Hepatic Neg liver ROS,   Endo/Other  Hypothyroidism Morbid obesity  Renal/GU negative Renal ROS  negative genitourinary   Musculoskeletal  (+) Arthritis , Osteoarthritis,  Impingement syndrome left shoulder Torn rotator cuff left shoulder   Abdominal (+) + obese,   Peds  Hematology  (+) anemia ,   Anesthesia Other Findings Has only two teeth, below  Reproductive/Obstetrics                          Anesthesia Physical Anesthesia Plan  ASA: III  Anesthesia Plan: General and Regional   Post-op Pain Management:    Induction: Intravenous  Airway Management Planned: Oral ETT  Additional Equipment:   Intra-op Plan:   Post-operative Plan: Extubation in OR  Informed Consent: I have reviewed the patients History and Physical, chart, labs and discussed the procedure including the risks, benefits and alternatives for the proposed anesthesia with the patient or authorized representative who has indicated his/her understanding and acceptance.   Dental advisory given  Plan Discussed with: Anesthesiologist, CRNA and Surgeon  Anesthesia Plan Comments:         Anesthesia Quick Evaluation

## 2016-03-06 NOTE — Anesthesia Postprocedure Evaluation (Signed)
Anesthesia Post Note  Patient: [name]  Procedure(s) Performed: Procedure(s) (LRB): LEFT SHOULDER ARTHROSCOPY WITH ROTATOR CUFF REPAIR AND SUBACROMIAL DECOMPRESSION, DISTAL CLAVICLE EXCISION (Left)  Patient location during evaluation: PACU Anesthesia Type: General and Regional Level of consciousness: awake Pain management: pain level controlled Vital Signs Assessment: post-procedure vital signs reviewed and stable Respiratory status: spontaneous breathing Cardiovascular status: stable Postop Assessment: no signs of nausea or vomiting Anesthetic complications: no    Last Vitals:  Filed Vitals:   03/06/16 1426 03/06/16 1436  BP:  130/73  Pulse:  80  Temp: 36.4 C   Resp:      Last Pain:  Filed Vitals:   03/06/16 1438  PainSc: Asleep                 Dyllin Gulley

## 2016-03-06 NOTE — Progress Notes (Signed)
   03/06/16 0829  OBSTRUCTIVE SLEEP APNEA  Have you ever been diagnosed with sleep apnea through a sleep study? No  Do you snore loudly (loud enough to be heard through closed doors)?  1  Do you often feel tired, fatigued, or sleepy during the daytime (such as falling asleep during driving or talking to someone)? 0  Has anyone observed you stop breathing during your sleep? 0  Do you have, or are you being treated for high blood pressure? 1  BMI more than 35 kg/m2? 1  Age > 50 (1-yes) 1  Neck circumference greater than:Female 16 inches or larger, Female 17inches or larger? 1  Female Gender (Yes=1) 0  Obstructive Sleep Apnea Score 5

## 2016-03-06 NOTE — Op Note (Signed)
   Date of Surgery: 02/19/2016  INDICATIONS: The patient is a 55 y.o.-year-old female with left shoulder rotator cuff tear, impingement syndrome that has failed conservative treatment;  The patient did consent to the procedure after discussion of the risks and benefits.  PREOPERATIVE DIAGNOSIS: 1. Left shoulder rotator cuff tear 2. Left impingement syndrome 3. Left AC joint arthrosis  POSTOPERATIVE DIAGNOSIS: Same.  PROCEDURE:  1. Arthroscopic left rotator cuff repair 2. Arthroscopic distal clavicle excision 3. Arthroscopic subacromial decompression with acromioplasty 4. Arthroscopic extensive debridement of the shoulder joint, labrum, biceps tendon, synovitis  SURGEON: N. Eduard Roux, M.D.  ASSIST: April Green, RNFA.  ANESTHESIA:  general, regional  IV FLUIDS AND URINE: See anesthesia.  ESTIMATED BLOOD LOSS: minimal mL.  IMPLANTS: Arthrex 4.75 mm swivel lock  COMPLICATIONS: None.  DESCRIPTION OF PROCEDURE: The patient was brought to the operating room and placed supine on the operating table.  The patient had been signed prior to the procedure and this was documented. The patient had the anesthesia placed by the anesthesiologist.  A time-out was performed to confirm that this was the correct patient, site, side and location. The patient did receive antibiotics prior to the incision and was re-dosed during the procedure as needed at indicated intervals.  The patient was then moved into the lateral position with the operative extremity suspended in the fishing pole mechanism. The patient had the operative extremity prepped and draped in the standard surgical fashion.    The standard posterior and anterior shoulder arthroscopy portals were established. We first visualized the shoulder joint. She did have evidence of degenerative anterior labrum and superior labral complex. This was debrided with an oscillating shaver. She did also have extensive synovitis within the shoulder joint which  was debrided with the oscillating shaver. The articular surface of the rotator cuff was intact. There were no gross deformities or pathology. We then turned our attention to the subacromial space. There was extensive amount of subacromial bursitis which was debrided. A cautery wand was used to delineate the anterolateral corner of the acromion. The distal clavicle and the acromioclavicular joint was also identified.  A high-speed bur was used to perform the distal clavicle excision. An acromioplasty was then also performed using the high-speed burr. Once we had adequate decompression and debridement of the subacromial space we identified the full thickness rotator cuff tear measuring approximately a centimeter wide with minimal retraction. This was in a crescent shape. A probe was used to identify the tear. We then used a horizontal mattress suture and a swivel lock in order to perform the rotator cuff repair. The swivel lock was anchored down into the humeral head just lateral to the articular margin. Final arthroscopy pictures were taken. The portal sites were closed with interrupted 3-0 nylon sutures. Sterile dressings were applied. Patient was placed in a shoulder immobilizer. She tolerated the procedure well and no immediate complications.  POSTOPERATIVE PLAN: Patient will be nonweightbearing to the left upper extremity. We'll see her in the office in 2 weeks. She'll be discharged home today.  Azucena Cecil, MD [place] 10:16 AM

## 2016-03-06 NOTE — Transfer of Care (Signed)
Immediate Anesthesia Transfer of Care Note  Patient: [name]  Procedure(s) Performed: Procedure(s): LEFT SHOULDER ARTHROSCOPY WITH ROTATOR CUFF REPAIR AND SUBACROMIAL DECOMPRESSION, DISTAL CLAVICLE EXCISION (Left)  Patient Location: PACU  Anesthesia Type:General  Level of Consciousness: awake, alert  and patient cooperative  Airway & Oxygen Therapy: Patient Spontanous Breathing and Patient connected to face mask oxygen  Post-op Assessment: Report given to RN and Post -op Vital signs reviewed and stable  Post vital signs: Reviewed and stable  Last Vitals:  Filed Vitals:   03/06/16 0910 03/06/16 1300  BP: 151/64   Pulse: 103   Temp:  36.4 C  Resp: 20     Last Pain:  Filed Vitals:   03/06/16 1307  PainSc: 4       Patients Stated Pain Goal: 2 ([id number])  Complications: No apparent anesthesia complications

## 2016-03-09 ENCOUNTER — Encounter (HOSPITAL_COMMUNITY): Payer: Self-pay | Admitting: Orthopaedic Surgery

## 2016-03-12 ENCOUNTER — Other Ambulatory Visit: Payer: Self-pay | Admitting: Internal Medicine

## 2016-03-23 ENCOUNTER — Encounter: Payer: Self-pay | Admitting: Physical Therapy

## 2016-03-23 ENCOUNTER — Ambulatory Visit: Payer: Self-pay | Attending: Orthopaedic Surgery | Admitting: Physical Therapy

## 2016-03-23 DIAGNOSIS — M25512 Pain in left shoulder: Secondary | ICD-10-CM | POA: Insufficient documentation

## 2016-03-23 DIAGNOSIS — M6281 Muscle weakness (generalized): Secondary | ICD-10-CM | POA: Insufficient documentation

## 2016-03-23 DIAGNOSIS — M25612 Stiffness of left shoulder, not elsewhere classified: Secondary | ICD-10-CM | POA: Insufficient documentation

## 2016-03-23 NOTE — Therapy (Signed)
[place], [place], [address] Phone: [phone]   Fax:  [phone]  Physical Therapy Evaluation  Patient Details  Name: [name] MRN: [id number] Date of Birth: Jul 16, 1961 Referring Provider: Dr Erlinda Hong  Encounter Date: 03/23/2016      PT End of Session - 03/23/16 1227    Visit Number 1   Number of Visits 16   Date for PT Re-Evaluation 05/18/16   Authorization Type CAFA    PT Start Time 0930   PT Stop Time 1013   PT Time Calculation (min) 43 min   Activity Tolerance Patient tolerated treatment well      Past Medical History  Diagnosis Date  . Hypertriglyceridemia     164 - 2/11  . Panic disorder 11/05  . Obesity     250 lbs 8/11  . Atypical chest pain     Negative cardiolite at [facility] cards 11/07  . Hypothyroidism   . Insomnia   . Allergic rhinitis   . Elevated BP   . Perirectal abscess 11/14/2011  . CHOLECYSTECTOMY, HX OF     Annotation: 2000 Qualifier: Diagnosis of  By: Eddie Dibbles MD, Bhakti    . History of blood transfusion 03/2008    Requried 2u prbc; menometrorrhagia, uterine fibroids.  . Anemia, iron deficiency   . Hypertension   . Anginal pain [facility])     last cardiologist visit 2010 - dx/ed with anxiety instead of cardiac pain  . Shortness of breath 05/18/2012    w/exertion"  . Arthritis     Past Surgical History  Procedure Laterality Date  . Incision and drainage perirectal abscess  11/14/2011    Procedure: IRRIGATION AND DEBRIDEMENT PERIRECTAL ABSCESS;  Surgeon: Zenovia Jarred, MD;  Location: [place];  Service: General;  Laterality: N/A;  . Abdominal hysterectomy  04/2007    total abdominal hysterectomy ( cervix) with bilateral salpingo-oopherectomy  . Dilation and curettage of uterus  1987  . Incision and drainage abscess  11/02/2012    Procedure: INCISION AND DRAINAGE ABSCESS;  Surgeon: Odis Hollingshead, MD;  Location: [place];  Service: General;  Laterality: N/A;  I&D anorectal  abscess  . Cholecystectomy  07/30/1999    Diagnosis of  By: Eddie Dibbles MD, Bhakti    . Nerve, tendon and artery repair Left 06/11/2013    Procedure: EXPLORATION AND REPAIR LEFT FOREARM;  Surgeon: Schuyler Amor, MD;  Location: [place];  Service: Orthopedics;  Laterality: Left;  . Shoulder arthroscopy with rotator cuff repair and subacromial decompression Left 03/06/2016    Procedure: LEFT SHOULDER ARTHROSCOPY WITH ROTATOR CUFF REPAIR AND SUBACROMIAL DECOMPRESSION, DISTAL CLAVICLE EXCISION;  Surgeon: Leandrew Koyanagi, MD;  Location: [place];  Service: Orthopedics;  Laterality: Left;    There were no vitals filed for this visit.       Subjective Assessment - 03/23/16 0937    Subjective Patient fell on march 24th and suffered a left rotator cuff tear. It was repaired on 03/06/2016. She is currently in a sling. She is left handed. Her husband has been helping with her ADL's    Pertinent History has no history of falls.    Limitations Lifting   How long can you sit comfortably? N/A    How long can you stand comfortably? > 20 min    How long can you walk comfortably? limited community distances bvefore the shoulder gets sore.    Diagnostic tests Nothing post op    Currently in Pain? Yes  Pain Score 7    Pain Location Shoulder   Pain Orientation Left   Pain Descriptors / Indicators Aching;Constant   Pain Type Surgical pain   Pain Onset 1 to 4 weeks ago   Pain Frequency Constant   Aggravating Factors  sleeping, prolonged positioning    Pain Relieving Factors rest, ice, pain meds    Effect of Pain on Daily Activities pain when    Multiple Pain Sites No            [facility] PT Assessment - 03/23/16 0001    Assessment   Medical Diagnosis Left rotator cuf tear medium size   Referring Provider Dr Erlinda Hong   Onset Date/Surgical Date 03/06/16   Hand Dominance Left   Next MD Visit 04/16/2016   Prior Therapy none    Precautions   Precautions Shoulder   Precaution Comments rotator cuff protocol percautions     Required Braces or Orthoses Sling   Restrictions   Weight Bearing Restrictions No   Balance Screen   Has the patient fallen in the past 6 months No   Home Environment   Additional Comments Lives with husband    Prior Function   Level of Independence Independent   Vocation Student   Cognition   Overall Cognitive Status Within Functional Limits for tasks assessed   Observation/Other Assessments   Observations brusing in the anterior arm down to the bicpes. Well healed port holes.    Sensation   Additional Comments No radicular signs   Posture/Postural Control   Posture/Postural Control Postural limitations   Postural Limitations Rounded Shoulders;Forward head   Posture Comments obesity    ROM / Strength   AROM / PROM / Strength AROM;PROM;Strength   AROM   Overall AROM Comments Not tested 2nd to recent surgery    PROM   Right/Left Shoulder Right;Left   Left Shoulder Flexion 84 Degrees   Left Shoulder ABduction 65 Degrees   Left Shoulder Internal Rotation 36 Degrees   Left Shoulder External Rotation 17 Degrees   Strength   Overall Strength Comments Not tested 2nd to recent surgery.                    [place] Adult PT Treatment/Exercise - 03/23/16 0001    Self-Care   Self-Care Other Self-Care Comments   Shoulder Exercises: Seated   Other Seated Exercises wrist flexion/ extesnion on a pillow; towell squeeze    Shoulder Exercises: Standing   Other Standing Exercises Pendulums 4 way    Manual Therapy   Manual therapy comments PROM into all planes following protocol limits.                 PT Education - 03/23/16 1226    Education provided Yes   Education Details Patient educated on the protocol progession and the improtance of not suing the left upper extremity at this time.    Person(s) Educated Patient   Methods Explanation;Demonstration   Comprehension Verbalized understanding;Returned demonstration          PT Short Term Goals - 03/23/16 1244     PT SHORT TERM GOAL #1   Title Patient will increase passive left external rotation to 30 degrees    Baseline 17 degrees    Time 2   Period Weeks   Status New   PT SHORT TERM GOAL #2   Title Patient will increase passvie shoulder flexion to 120 degrees    Time 2   Period Weeks   Status New  PT SHORT TERM GOAL #3   Title Patient will demsotrate 160 degrees of passive right shoulder flexion    Time 4   Period Weeks   Status New   PT SHORT TERM GOAL #4   Title Patient will dmeostrate 70 degrees of passive left dshoulder internal rotation    Baseline 37 degrees    Time 4   Period Weeks   Status New   PT SHORT TERM GOAL #5   Title Patient will wean out of the sling    Baseline still wearing sling    Time 4   Period Weeks   Status New   Additional Short Term Goals   Additional Short Term Goals Yes   PT SHORT TERM GOAL #6   Title Patient will begin HEP for active assistive exercises    Time 4   Period Weeks   Status New           PT Long Term Goals - 03/23/16 1249    PT LONG TERM GOAL #1   Title Patient will reach behind back to t-10 without pain on the L in order to perfrom ADL's    Time 8   Period Weeks   Status New   PT LONG TERM GOAL #2   Title Patient will reach behind her head without pain in order to perfrom  ADL's    Time 8   Period Weeks   Status New   PT LONG TERM GOAL #3   Title Patient will reach overhead into a cabinet to grab a 2 lb item without pain in order to perfrom IADL's    Time 8   Period Weeks   Status New   PT LONG TERM GOAL #4   Title Patient will be I w/ strengthening program    Time 8   Period Weeks   Status New   PT LONG TERM GOAL #5   Title Patient will report no pain when perfroming grooming tasks    Time 8   Period Weeks   Status New               Plan - 03/23/16 1228    Clinical Impression Statement Patient is a 55 year old female S/P left rotator cuff repair and distal clivicle resection. She presents with  expected decreases in pain, range of motion, and strength. She has been complinat with sling use. Therapy will follow [facility] protocol for medium  sized rotator cuff repair. She would benefit  from further skilled therapy. She  was seen for a low comlpexity evaluation.    Rehab Potential Good   PT Frequency 2x / week   PT Duration 8 weeks   PT Treatment/Interventions ADLs/Self Care Home Management;Cryotherapy;Electrical Stimulation;Iontophoresis 4mg /ml Dexamethasone;Moist Heat;Therapeutic exercise;Therapeutic activities;Functional mobility training;Ultrasound;Neuromuscular re-education;Patient/family education;Manual techniques;Passive range of motion;Dry needling   PT Next Visit Plan continue with PROM per protocol until POW 5; review HEP    PT Home Exercise Plan Pendulums 4 way towel squeeze, wrist flexion/ extension    Consulted and Agree with Plan of Care Patient      Patient will benefit from skilled therapeutic intervention in order to improve the following deficits and impairments:  Decreased activity tolerance, Decreased strength, Impaired UE functional use, Decreased endurance, Decreased range of motion  Visit Diagnosis: Pain in left shoulder - Plan: PT PLAN OF CARE CERT/RE-CERT  Stiffness of left shoulder, not elsewhere classified - Plan: PT PLAN OF CARE CERT/RE-CERT  Muscle weakness (generalized) - Plan: PT PLAN OF  CARE CERT/RE-CERT     Problem List Patient Active Problem List   Diagnosis Date Noted  . Acute upper respiratory infection 01/22/2016  . Acute cystitis without hematuria 11/08/2015  . Venous (peripheral) insufficiency 11/08/2015  . Chronic low back pain 11/08/2015  . Pain in joint, shoulder region 04/12/2015  . Left wrist pain 04/12/2015  . Otitis externa of both ears 02/09/2014  . HTN (hypertension) 02/09/2014  . Preventative health care 02/09/2014  . Dog bite of bilateral forearm, left breast  06/30/2013  . Bilateral calf pain 04/06/2013  . 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 Metabolic syndrome [id number]  . S/P TAH-BSO (total abdominal hysterectomy and bilateral salpingo-oophorectomy), Hx of 09/22/2012  . Pectus excavatum 09/22/2012  . Hepatic steatosis 09/22/2012  . 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 Chronic left maxillary sinus disease 09/22/2012  . Prediabetes 09/22/2012  . Family history of leukemia 09/22/2012  . Proteinuria 07/17/2012  . INSOMNIA UNSPECIFIED 05/01/2010  . ALLERGIC RHINITIS, SEASONAL 12/06/2009  . Hypothyroidism 04/05/2008  . ANEMIA, IRON DEFICIENCY, history of 04/05/2008  . Hyperlipidemia LDL goal < 130 08/31/2006  . OBESITY 08/31/2006  . PANIC DISORDER 08/31/2006  . CHOLECYSTECTOMY, HX OF 08/31/2006    Carney Living PT DPT  03/23/2016, 1:06 PM  [facility] [address] [place], [place], [address] Phone: [phone]   Fax:  [phone]  Name: [name] MRN: [id number] Date of Birth: [birth year]

## 2016-03-31 ENCOUNTER — Ambulatory Visit: Payer: Self-pay | Admitting: Physical Therapy

## 2016-03-31 DIAGNOSIS — M25612 Stiffness of left shoulder, not elsewhere classified: Secondary | ICD-10-CM

## 2016-03-31 DIAGNOSIS — M6281 Muscle weakness (generalized): Secondary | ICD-10-CM

## 2016-03-31 DIAGNOSIS — M25512 Pain in left shoulder: Secondary | ICD-10-CM

## 2016-04-01 ENCOUNTER — Ambulatory Visit: Payer: Self-pay | Admitting: Physical Therapy

## 2016-04-01 NOTE — Therapy (Signed)
[place], [place], [address] Phone: [phone]   Fax:  [phone]  Physical Therapy Treatment  Patient Details  Name: [name] MRN: [id number] Date of Birth: 1961-06-15 Referring Provider: Dr Erlinda Hong  Encounter Date: 03/31/2016      PT End of Session - 04/01/16 0934    Visit Number 2   Number of Visits 16   Date for PT Re-Evaluation 05/18/16   Authorization Type CAFA    PT Start Time 1630   PT Stop Time 1720   PT Time Calculation (min) 50 min   Equipment Utilized During Treatment Gait belt   Activity Tolerance Patient tolerated treatment well   Behavior During Therapy [facility] for tasks assessed/performed      Past Medical History  Diagnosis Date  . Hypertriglyceridemia     164 - 2/11  . Panic disorder 11/05  . Obesity     250 lbs 8/11  . Atypical chest pain     Negative cardiolite at [facility] cards 11/07  . Hypothyroidism   . Insomnia   . Allergic rhinitis   . Elevated BP   . Perirectal abscess 11/14/2011  . CHOLECYSTECTOMY, HX OF     Annotation: 2000 Qualifier: Diagnosis of  By: Eddie Dibbles MD, Bhakti    . History of blood transfusion 03/2008    Requried 2u prbc; menometrorrhagia, uterine fibroids.  . Anemia, iron deficiency   . Hypertension   . Anginal pain [facility])     last cardiologist visit 2010 - dx/ed with anxiety instead of cardiac pain  . Shortness of breath 05/18/2012    w/exertion"  . Arthritis     Past Surgical History  Procedure Laterality Date  . Incision and drainage perirectal abscess  11/14/2011    Procedure: IRRIGATION AND DEBRIDEMENT PERIRECTAL ABSCESS;  Surgeon: Zenovia Jarred, MD;  Location: [place];  Service: General;  Laterality: N/A;  . Abdominal hysterectomy  04/2007    total abdominal hysterectomy ( cervix) with bilateral salpingo-oopherectomy  . Dilation and curettage of uterus  1987  . Incision and drainage abscess  11/02/2012    Procedure: INCISION AND DRAINAGE ABSCESS;   Surgeon: Odis Hollingshead, MD;  Location: [place];  Service: General;  Laterality: N/A;  I&D anorectal abscess  . Cholecystectomy  07/30/1999    Diagnosis of  By: Eddie Dibbles MD, Bhakti    . Nerve, tendon and artery repair Left 06/11/2013    Procedure: EXPLORATION AND REPAIR LEFT FOREARM;  Surgeon: Schuyler Amor, MD;  Location: [place];  Service: Orthopedics;  Laterality: Left;  . Shoulder arthroscopy with rotator cuff repair and subacromial decompression Left 03/06/2016    Procedure: LEFT SHOULDER ARTHROSCOPY WITH ROTATOR CUFF REPAIR AND SUBACROMIAL DECOMPRESSION, DISTAL CLAVICLE EXCISION;  Surgeon: Leandrew Koyanagi, MD;  Location: [place];  Service: Orthopedics;  Laterality: Left;    There were no vitals filed for this visit.      Subjective Assessment - 04/01/16 0754    Subjective Patient reports increased soreness after last visit. She reports her pain is about a 6/10. She reports pain when reaching. Patient strongly advised not to reach.    Pertinent History has no history of falls.    Limitations Lifting   How long can you sit comfortably? N/A    How long can you stand comfortably? > 20 min    How long can you walk comfortably? limited community distances bvefore the shoulder gets sore.    Diagnostic tests Nothing post op  Currently in Pain? Yes   Pain Score 6    Pain Location Shoulder   Pain Orientation Left   Pain Descriptors / Indicators Aching;Constant   Pain Type Surgical pain   Pain Onset 1 to 4 weeks ago   Pain Frequency Constant   Aggravating Factors  sleeping,    Pain Relieving Factors rest ice pain meds    Effect of Pain on Daily Activities unable to use her L arm   Multiple Pain Sites No                         [facility] Adult PT Treatment/Exercise - 04/01/16 0001    Shoulder Exercises: Seated   Other Seated Exercises wrist flexion/ extesnion on a pillow; towell squeeze    Shoulder Exercises: Standing   Other Standing Exercises Pendulums 4 way    Manual  Therapy   Manual therapy comments PROM into all planes following protocol limits. gentle                 PT Education - 04/01/16 0934    Education provided Yes   Education Details Do not reach with the shoulder at this time    Person(s) Educated Patient   Methods Explanation;Demonstration   Comprehension Verbalized understanding;Returned demonstration          PT Short Term Goals - 04/01/16 0941    PT SHORT TERM GOAL #1   Title Patient will increase passive left external rotation to 30 degrees    Baseline 30 degrees without pain    Time 2   Period Weeks   Status Achieved   PT SHORT TERM GOAL #2   Title Patient will increase passvie shoulder flexion to 120 degrees    Baseline still limited to 90 degrees    Status On-going   PT SHORT TERM GOAL #3   Title Patient will demsotrate 160 degrees of passive right shoulder flexion    Time 4   Period Weeks   Status On-going   PT SHORT TERM GOAL #4   Title Patient will dmeostrate 70 degrees of passive left dshoulder internal rotation    Baseline 37 degrees    Time 4   Period Weeks   Status On-going   PT SHORT TERM GOAL #5   Title Patient will wean out of the sling    Baseline still wearing sling    Time 4   Period Weeks   Status On-going   PT SHORT TERM GOAL #6   Title Patient will begin HEP for active assistive exercises    Time 4   Period Weeks   Status On-going           PT Long Term Goals - 03/23/16 1249    PT LONG TERM GOAL #1   Title Patient will reach behind back to t-10 without pain on the L in order to perfrom ADL's    Time 8   Period Weeks   Status New   PT LONG TERM GOAL #2   Title Patient will reach behind her head without pain in order to perfrom  ADL's    Time 8   Period Weeks   Status New   PT LONG TERM GOAL #3   Title Patient will reach overhead into a cabinet to grab a 2 lb item without pain in order to perfrom IADL's    Time 8   Period Weeks   Status New   PT LONG TERM GOAL #4  Title Patient will be I w/ strengthening program    Time 8   Period Weeks   Status New   PT LONG TERM GOAL #5   Title Patient will report no pain when perfroming grooming tasks    Time 8   Period Weeks   Status New               Plan - 04/01/16 0935    Clinical Impression Statement S/P RTC repair on 03/06/16. She is currnetly 4 weeks post op. She tolerated treatment weell. Her external rotation and flexion are progressing per protocol. She is still limited in internal rotation. She demsotrated her HEP with minimal cuing.    PT Treatment/Interventions ADLs/Self Care Home Management;Cryotherapy;Electrical Stimulation;Iontophoresis 4mg /ml Dexamethasone;Moist Heat;Therapeutic exercise;Therapeutic activities;Functional mobility training;Ultrasound;Neuromuscular re-education;Patient/family education;Manual techniques;Passive range of motion;Dry needling   PT Next Visit Plan begin light active assistive activity next week if able.    PT Home Exercise Plan Pendulums 4 way towel squeeze, wrist flexion/ extension    Consulted and Agree with Plan of Care Patient      Patient will benefit from skilled therapeutic intervention in order to improve the following deficits and impairments:  Decreased activity tolerance, Decreased strength, Impaired UE functional use, Decreased endurance, Decreased range of motion  Visit Diagnosis: Pain in left shoulder  Stiffness of left shoulder, not elsewhere classified  Muscle weakness (generalized)     Problem List Patient Active Problem List   Diagnosis Date Noted  . Acute upper respiratory infection 01/22/2016  . Acute cystitis without hematuria 11/08/2015  . Venous (peripheral) insufficiency 11/08/2015  . Chronic low back pain 11/08/2015  . Pain in joint, shoulder region 04/12/2015  . Left wrist pain 04/12/2015  . Otitis externa of both ears 02/09/2014  . HTN (hypertension) 02/09/2014  . Preventative health care 02/09/2014  . Dog bite of  bilateral forearm, left breast  06/30/2013  . Bilateral calf pain 04/06/2013  . 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 Metabolic syndrome [id number]  . S/P TAH-BSO (total abdominal hysterectomy and bilateral salpingo-oophorectomy), Hx of 09/22/2012  . Pectus excavatum 09/22/2012  . Hepatic steatosis 09/22/2012  . 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 Chronic left maxillary sinus disease 09/22/2012  . Prediabetes 09/22/2012  . Family history of leukemia 09/22/2012  . Proteinuria 07/17/2012  . INSOMNIA UNSPECIFIED 05/01/2010  . ALLERGIC RHINITIS, SEASONAL 12/06/2009  . Hypothyroidism 04/05/2008  . ANEMIA, IRON DEFICIENCY, history of 04/05/2008  . Hyperlipidemia LDL goal < 130 08/31/2006  . OBESITY 08/31/2006  . PANIC DISORDER 08/31/2006  . CHOLECYSTECTOMY, HX OF 08/31/2006    Carney Living PT DPT  04/01/2016, 11:19 AM  [place], [place], [address] Phone: [phone]   Fax:  [phone]  Name: [name] MRN: [id number] Date of Birth: 03-23-1961

## 2016-04-06 ENCOUNTER — Ambulatory Visit: Payer: Self-pay | Admitting: Physical Therapy

## 2016-04-06 DIAGNOSIS — M25612 Stiffness of left shoulder, not elsewhere classified: Secondary | ICD-10-CM

## 2016-04-06 DIAGNOSIS — M6281 Muscle weakness (generalized): Secondary | ICD-10-CM

## 2016-04-06 DIAGNOSIS — M25512 Pain in left shoulder: Secondary | ICD-10-CM

## 2016-04-06 NOTE — Patient Instructions (Addendum)
Table Top Stretch (Static)    Sitting at table, slide arm across table. Hold __5-10_ seconds. Repeat __5-10_ times left  arm Do _2__ sessions per day.      SHOULDER: External Rotation - Supine (Cane)   Hold cane with both hands. Rotate arm away from body. Keep elbow on floor and next to body. _10-20__ reps per set, _2__ sets per day, __7_ days per week Add towel to keep elbow at side.   Cane Exercise: Flexion   Lie on back, holding cane above chest. Keeping arms as straight as possible, lower cane toward floor beyond head. Hold _5___ seconds. Repeat __10-20__ times. Do _2___ sessions per day.  Strengthening: Isometric Flexion  Using wall for resistance, press right fist into ball using light pressure. Hold _30___ seconds. Repeat __3__ times per set. Do __1__ sets per session. Do __2__ sessions per day.    Extension (Isometric)  Place left bent elbow and back of arm against wall. Press elbow against wall. Hold __30__ seconds. Repeat _3___ times. Do __2__ sessions per day.  Internal Rotation (Isometric)  Place palm of right fist against door frame, with elbow bent. Press fist against door frame. Hold _30__ seconds. Repeat _3___ times. Do __2__ sessions per day.  External Rotation (Isometric)  Place back of left fist against door frame, with elbow bent. Press fist against door frame. Hold ___30_ seconds. Repeat _3___ times. Do __2__ sessions per day.

## 2016-04-06 NOTE — Therapy (Signed)
[place], [place], [address] Phone: [phone]   Fax:  [phone]  Physical Therapy Treatment  Patient Details  Name: [name] MRN: [id number] Date of Birth: 1960/12/26 Referring Provider: Dr Erlinda Hong  Encounter Date: 04/06/2016      PT End of Session - 04/06/16 1104    Visit Number 3   Number of Visits 16   Date for PT Re-Evaluation 05/18/16   Authorization Type CAFA    PT Start Time 1100   PT Stop Time 1151   PT Time Calculation (min) 51 min      Past Medical History  Diagnosis Date  . Hypertriglyceridemia     164 - 2/11  . Panic disorder 11/05  . Obesity     250 lbs 8/11  . Atypical chest pain     Negative cardiolite at [facility] cards 11/07  . Hypothyroidism   . Insomnia   . Allergic rhinitis   . Elevated BP   . Perirectal abscess 11/14/2011  . CHOLECYSTECTOMY, HX OF     Annotation: 2000 Qualifier: Diagnosis of  By: Eddie Dibbles MD, Bhakti    . History of blood transfusion 03/2008    Requried 2u prbc; menometrorrhagia, uterine fibroids.  . Anemia, iron deficiency   . Hypertension   . Anginal pain [facility])     last cardiologist visit 2010 - dx/ed with anxiety instead of cardiac pain  . Shortness of breath 05/18/2012    w/exertion"  . Arthritis     Past Surgical History  Procedure Laterality Date  . Incision and drainage perirectal abscess  11/14/2011    Procedure: IRRIGATION AND DEBRIDEMENT PERIRECTAL ABSCESS;  Surgeon: Zenovia Jarred, MD;  Location: [place];  Service: General;  Laterality: N/A;  . Abdominal hysterectomy  04/2007    total abdominal hysterectomy ( cervix) with bilateral salpingo-oopherectomy  . Dilation and curettage of uterus  1987  . Incision and drainage abscess  11/02/2012    Procedure: INCISION AND DRAINAGE ABSCESS;  Surgeon: Odis Hollingshead, MD;  Location: [place];  Service: General;  Laterality: N/A;  I&D anorectal abscess  . Cholecystectomy  07/30/1999    Diagnosis of  By:  Eddie Dibbles MD, Bhakti    . Nerve, tendon and artery repair Left 06/11/2013    Procedure: EXPLORATION AND REPAIR LEFT FOREARM;  Surgeon: Schuyler Amor, MD;  Location: [place];  Service: Orthopedics;  Laterality: Left;  . Shoulder arthroscopy with rotator cuff repair and subacromial decompression Left 03/06/2016    Procedure: LEFT SHOULDER ARTHROSCOPY WITH ROTATOR CUFF REPAIR AND SUBACROMIAL DECOMPRESSION, DISTAL CLAVICLE EXCISION;  Surgeon: Leandrew Koyanagi, MD;  Location: [place];  Service: Orthopedics;  Laterality: Left;    There were no vitals filed for this visit.      Subjective Assessment - 04/06/16 1105    Currently in Pain? Yes   Pain Score 8    Pain Location Shoulder   Pain Descriptors / Indicators Throbbing;Sore   Aggravating Factors                            [facility] Adult PT Treatment/Exercise - 04/06/16 0001    Shoulder Exercises: Supine   External Rotation AAROM;15 reps   External Rotation Limitations with cane   Flexion AAROM;Both;15 reps   Flexion Limitations with cane (chest press)    Shoulder Exercises: Seated   Flexion AAROM;15 reps   Flexion Limitations table slides   Shoulder Exercises:  Isometric Strengthening   Flexion Limitations 3 x 30 sec   Extension Limitations 3 x 30 sec   External Rotation Limitations 3 x 30 sec   Internal Rotation Limitations 3 x 30 sec   Modalities   Modalities Cryotherapy;Electrical Stimulation   Cryotherapy   Number Minutes Cryotherapy 15 Minutes   Cryotherapy Location Shoulder   Type of Cryotherapy Ice pack   Electrical Stimulation   Electrical Stimulation Location left shoulder   Electrical Stimulation Action IFC x 15 minutes   Electrical Stimulation Parameters to tolerance   Electrical Stimulation Goals Pain   Manual Therapy   Manual therapy comments PROM into all planes following protocol limits. gentle                 PT Education - 04/06/16 1132    Education provided Yes   Education Details supine  chest press and ER AAROM, isometric Flex, IR, ER, EXT, Table slide   Person(s) Educated Patient;Spouse   Methods Explanation;Demonstration;Handout   Comprehension Verbalized understanding;Returned demonstration          PT Short Term Goals - 04/01/16 0941    PT SHORT TERM GOAL #1   Title Patient will increase passive left external rotation to 30 degrees    Baseline 30 degrees without pain    Time 2   Period Weeks   Status Achieved   PT SHORT TERM GOAL #2   Title Patient will increase passvie shoulder flexion to 120 degrees    Baseline still limited to 90 degrees    Status On-going   PT SHORT TERM GOAL #3   Title Patient will demsotrate 160 degrees of passive right shoulder flexion    Time 4   Period Weeks   Status On-going   PT SHORT TERM GOAL #4   Title Patient will dmeostrate 70 degrees of passive left dshoulder internal rotation    Baseline 37 degrees    Time 4   Period Weeks   Status On-going   PT SHORT TERM GOAL #5   Title Patient will wean out of the sling    Baseline still wearing sling    Time 4   Period Weeks   Status On-going   PT SHORT TERM GOAL #6   Title Patient will begin HEP for active assistive exercises    Time 4   Period Weeks   Status On-going           PT Long Term Goals - 03/23/16 1249    PT LONG TERM GOAL #1   Title Patient will reach behind back to t-10 without pain on the L in order to perfrom ADL's    Time 8   Period Weeks   Status New   PT LONG TERM GOAL #2   Title Patient will reach behind her head without pain in order to perfrom  ADL's    Time 8   Period Weeks   Status New   PT LONG TERM GOAL #3   Title Patient will reach overhead into a cabinet to grab a 2 lb item without pain in order to perfrom IADL's    Time 8   Period Weeks   Status New   PT LONG TERM GOAL #4   Title Patient will be I w/ strengthening program    Time 8   Period Weeks   Status New   PT LONG TERM GOAL #5   Title Patient will report no pain when  perfroming grooming tasks    Time  8   Period Weeks   Status New               Plan - 04/06/16 1459    Clinical Impression Statement Pt reports increased pain today. She was able to perfrom AAROM exercises without c/o increased pain. Update HEP to include isometrics, table slides and supine cane exercises. Education to stop any exercises that are painful. Pt and husband verbalize understanding. Trial of IFC with ice today and pt reports 4/10 pain at end of treatment.    PT Next Visit Plan check AAROM HEP and modify as needed.       Patient will benefit from skilled therapeutic intervention in order to improve the following deficits and impairments:  Decreased activity tolerance, Decreased strength, Impaired UE functional use, Decreased endurance, Decreased range of motion  Visit Diagnosis: Pain in left shoulder  Stiffness of left shoulder, not elsewhere classified  Muscle weakness (generalized)     Problem List Patient Active Problem List   Diagnosis Date Noted  . Acute upper respiratory infection 01/22/2016  . Acute cystitis without hematuria 11/08/2015  . Venous (peripheral) insufficiency 11/08/2015  . Chronic low back pain 11/08/2015  . Pain in joint, shoulder region 04/12/2015  . Left wrist pain 04/12/2015  . Otitis externa of both ears 02/09/2014  . HTN (hypertension) 02/09/2014  . Preventative health care 02/09/2014  . Dog bite of bilateral forearm, left breast  06/30/2013  . Bilateral calf pain 04/06/2013  . 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 Metabolic syndrome [id number]  . S/P TAH-BSO (total abdominal hysterectomy and bilateral salpingo-oophorectomy), Hx of 09/22/2012  . Pectus excavatum 09/22/2012  . Hepatic steatosis 09/22/2012  . 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 Chronic left maxillary sinus disease 09/22/2012  . Prediabetes 09/22/2012  . Family history of leukemia 09/22/2012  . Proteinuria 07/17/2012  . INSOMNIA UNSPECIFIED 05/01/2010  .  ALLERGIC RHINITIS, SEASONAL 12/06/2009  . Hypothyroidism 04/05/2008  . ANEMIA, IRON DEFICIENCY, history of 04/05/2008  . Hyperlipidemia LDL goal < 130 08/31/2006  . OBESITY 08/31/2006  . PANIC DISORDER 08/31/2006  . CHOLECYSTECTOMY, HX OF 08/31/2006    Dorene Ar , PTA  04/06/2016, 3:04 PM  [place], [place], [address] Phone: [phone]   Fax:  [phone]  Name: [name] MRN: [id number] Date of Birth: 12-19-1960

## 2016-04-08 ENCOUNTER — Ambulatory Visit: Payer: Self-pay | Admitting: Physical Therapy

## 2016-04-08 DIAGNOSIS — M25512 Pain in left shoulder: Secondary | ICD-10-CM

## 2016-04-08 DIAGNOSIS — M6281 Muscle weakness (generalized): Secondary | ICD-10-CM

## 2016-04-08 DIAGNOSIS — M25612 Stiffness of left shoulder, not elsewhere classified: Secondary | ICD-10-CM

## 2016-04-08 NOTE — Therapy (Addendum)
[place], [place], [address] Phone: [phone]   Fax:  [phone]  Physical Therapy Treatment  Patient Details  Name: [name] MRN: [id number] Date of Birth: [birth year] Referring Provider: Dr Erlinda Hong  Encounter Date: 04/08/2016      PT End of Session - 04/08/16 1630    Visit Number 4   Number of Visits 16   Date for PT Re-Evaluation 05/18/16   Authorization Type CAFA    PT Start Time 1015   PT Stop Time 0400   PT Time Calculation (min) 1065 min   Activity Tolerance Patient tolerated treatment well   Behavior During Therapy [facility] for tasks assessed/performed      Past Medical History  Diagnosis Date  . Hypertriglyceridemia     164 - 2/11  . Panic disorder 11/05  . Obesity     250 lbs 8/11  . Atypical chest pain     [facility]olite [facility] 11/07  . Hypothyroidism   . Insomnia   . Allergic rhinitis   . Elevated BP   . Perirectal abscess 11/14/2011  . CHOLECYSTECTOMY, HX OF     Annotation: 2000 Qualifier: Diagnosis of  By: Eddie Dibbles MD, Bhakti    . History of blood transfusion 03/2008    Requried 2u prbc; menometrorrhagia, uterine fibroids.  . Anemia, iron deficiency   . Hypertension   . Anginal pain [facility])     last cardiologist visit 2010 - dx/ed with anxiety instead of cardiac pain  . Shortness of breath 05/18/2012    w/exertion"  . Arthritis     Past Surgical History  Procedure Laterality Date  . Incision and drainage perirectal abscess  11/14/2011    Procedure: IRRIGATION AND DEBRIDEMENT PERIRECTAL ABSCESS;  Surgeon: Zenovia Jarred, MD;  Location: [place];  Service: General;  Laterality: N/A;  . Abdominal hysterectomy  04/2007    total abdominal hysterectomy ( cervix) with bilateral salpingo-oopherectomy  . Dilation and curettage of uterus  1987  . Incision and drainage abscess  11/02/2012    Procedure: INCISION AND DRAINAGE ABSCESS;  Surgeon: Odis Hollingshead, MD;  Location: [place];  Service: General;  Laterality: N/A;  I&D anorectal abscess  . Cholecystectomy  07/30/1999    Diagnosis of  By: Eddie Dibbles MD, Bhakti    . Nerve, tendon and artery repair Left 06/11/2013    Procedure: EXPLORATION AND REPAIR LEFT FOREARM;  Surgeon: Schuyler Amor, MD;  Location: [place];  Service: Orthopedics;  Laterality: Left;  . Shoulder arthroscopy with rotator cuff repair and subacromial decompression Left 03/06/2016    Procedure: LEFT SHOULDER ARTHROSCOPY WITH ROTATOR CUFF REPAIR AND SUBACROMIAL DECOMPRESSION, DISTAL CLAVICLE EXCISION;  Surgeon: Leandrew Koyanagi, MD;  Location: [place];  Service: Orthopedics;  Laterality: Left;    There were no vitals filed for this visit.      Subjective Assessment - 04/08/16 1506    Subjective Yesterday the patient reports her grancchild was falling and she had to stop and grab him. She grabbed him with her left shoulder and felt a pull. She has been sore since. her pain is about a 7/10    Pertinent History has no history of falls.    Limitations Lifting   How long can you walk comfortably? limited community distances bvefore the shoulder gets sore.    Diagnostic tests Nothing post op    Currently in Pain? Yes   Pain Score 7    Pain Location Shoulder  Pain Orientation Left   Pain Descriptors / Indicators Aching;Throbbing   Pain Type Surgical pain   Pain Onset 1 to 4 weeks ago   Pain Frequency Constant   Multiple Pain Sites No                         [facility] Adult PT Treatment/Exercise - 04/08/16 0001    Shoulder Exercises: Supine   External Rotation AAROM;15 reps   External Rotation Limitations with cane   Flexion AAROM;Both;15 reps   Flexion Limitations with cane (chest press)    Shoulder Exercises: Seated   Flexion AAROM;15 reps   Flexion Limitations table slides   Shoulder Exercises: Isometric Strengthening   Flexion Limitations 3 x 30 sec   Extension Limitations 3 x 30 sec   External Rotation Limitations 3 x 30 sec    Internal Rotation Limitations 3 x 30 sec   Modalities   Modalities Cryotherapy;Electrical Stimulation   Cryotherapy   Number Minutes Cryotherapy 15 Minutes   Cryotherapy Location Shoulder   Electrical Stimulation   Electrical Stimulation Location left shoulder   Electrical Stimulation Action [id number]    Electrical Stimulation Parameters to tolerance    Electrical Stimulation Goals Pain   Manual Therapy   Manual therapy comments PROM into all planes following protocol limits. gentle                 PT Education - 04/08/16 1616    Education provided Yes   Education Details improtance of stregthening. Importance of not using the arm. Reviewed technique with isometrics    Person(s) Educated Patient   Methods Explanation;Demonstration   Comprehension Verbalized understanding;Returned demonstration          PT Short Term Goals - 04/01/16 0941    PT SHORT TERM GOAL #1   Title Patient will increase passive left external rotation to 30 degrees    Baseline 30 degrees without pain    Time 2   Period Weeks   Status Achieved   PT SHORT TERM GOAL #2   Title Patient will increase passvie shoulder flexion to 120 degrees    Baseline still limited to 90 degrees    Status On-going   PT SHORT TERM GOAL #3   Title Patient will demsotrate 160 degrees of passive right shoulder flexion    Time 4   Period Weeks   Status On-going   PT SHORT TERM GOAL #4   Title Patient will dmeostrate 70 degrees of passive left dshoulder internal rotation    Baseline 37 degrees    Time 4   Period Weeks   Status On-going   PT SHORT TERM GOAL #5   Title Patient will wean out of the sling    Baseline still wearing sling    Time 4   Period Weeks   Status On-going   PT SHORT TERM GOAL #6   Title Patient will begin HEP for active assistive exercises    Time 4   Period Weeks   Status On-going           PT Long Term Goals - 03/23/16 1249    PT LONG TERM GOAL #1   Title Patient will reach  behind back to t-10 without pain on the L in order to perfrom ADL's    Time 8   Period Weeks   Status New   PT LONG TERM GOAL #2   Title Patient will reach behind her head without pain in order  to perfrom  ADL's    Time 8   Period Weeks   Status New   PT LONG TERM GOAL #3   Title Patient will reach overhead into a cabinet to grab a 2 lb item without pain in order to perfrom IADL's    Time 8   Period Weeks   Status New   PT LONG TERM GOAL #4   Title Patient will be I w/ strengthening program    Time 8   Period Weeks   Status New   PT LONG TERM GOAL #5   Title Patient will report no pain when perfroming grooming tasks    Time 8   Period Weeks   Status New               Plan - 04/08/16 1633    Clinical Impression Statement Depsite pain the patient tolerated treatment well. She had end range flexion with slight pain and demsotrated 60 degrees of external rotation without an end feel. She was able to perfrom isometrics with minimal cuing.    Rehab Potential Good   PT Frequency 2x / week   PT Duration 8 weeks   PT Treatment/Interventions ADLs/Self Care Home Management;Cryotherapy;Electrical Stimulation;Iontophoresis '4mg'$ /ml Dexamethasone;Moist Heat;Therapeutic exercise;Therapeutic activities;Functional mobility training;Ultrasound;Neuromuscular re-education;Patient/family education;Manual techniques;Passive range of motion;Dry needling   PT Next Visit Plan check AAROM HEP and modify as needed.    PT Home Exercise Plan Pendulums 4 way towel squeeze, wrist flexion/ extension    Consulted and Agree with Plan of Care Patient      Patient will benefit from skilled therapeutic intervention in order to improve the following deficits and impairments:  Decreased activity tolerance, Decreased strength, Impaired UE functional use, Decreased endurance, Decreased range of motion  Visit Diagnosis: Pain in left shoulder  Stiffness of left shoulder, not elsewhere classified  Muscle  weakness (generalized)     Problem List Patient Active Problem List   Diagnosis Date Noted  . Acute upper respiratory infection 01/22/2016  . Acute cystitis without hematuria 11/08/2015  . Venous (peripheral) insufficiency 11/08/2015  . Chronic low back pain 11/08/2015  . Pain in joint, shoulder region 04/12/2015  . Left wrist pain 04/12/2015  . Otitis externa of both ears 02/09/2014  . HTN (hypertension) 02/09/2014  . Preventative health care 02/09/2014  . Dog bite of bilateral forearm, left breast  06/30/2013  . Bilateral calf pain 04/06/2013  . 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 Metabolic syndrome 95/62/1308  . S/P TAH-BSO (total abdominal hysterectomy and bilateral salpingo-oophorectomy), Hx of 09/22/2012  . Pectus excavatum 09/22/2012  . Hepatic steatosis 09/22/2012  . 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 Chronic left maxillary sinus disease 09/22/2012  . Prediabetes 09/22/2012  . Family history of leukemia 09/22/2012  . Proteinuria 07/17/2012  . INSOMNIA UNSPECIFIED 05/01/2010  . ALLERGIC RHINITIS, SEASONAL 12/06/2009  . Hypothyroidism 04/05/2008  . ANEMIA, IRON DEFICIENCY, history of 04/05/2008  . Hyperlipidemia LDL goal < 130 08/31/2006  . OBESITY 08/31/2006  . PANIC DISORDER 08/31/2006  . CHOLECYSTECTOMY, HX OF 08/31/2006    Carney Living PT DPT  04/08/2016, 4:37 PM  [facility] [address] [place], [place], [address] Phone: [phone]   Fax:  [phone]  Name: [name] MRN: [id number] Date of Birth: 1961-08-10    Pt had only begun isometric strengthening before not returning to therapy. She had significant weakness and was still in the sling at the time of self discharge. D/C to current HEP.   PHYSICAL  THERAPY DISCHARGE SUMMARY  Visits from Start of Care: 4  Current functional level related to goals / functional outcomes: Unkown  Remaining deficits: Unknown    Education /  Equipment: Unknown  Plan: Patient agrees to discharge.  Patient goals were not met. Patient is being discharged due to not returning since the last visit.  ?????

## 2016-04-13 ENCOUNTER — Ambulatory Visit: Payer: Self-pay | Attending: Orthopaedic Surgery | Admitting: Physical Therapy

## 2016-04-15 ENCOUNTER — Ambulatory Visit: Payer: Self-pay | Admitting: Physical Therapy

## 2016-04-20 ENCOUNTER — Ambulatory Visit: Payer: Self-pay | Admitting: Physical Therapy

## 2016-04-22 ENCOUNTER — Encounter: Payer: Self-pay | Admitting: Internal Medicine

## 2016-04-23 ENCOUNTER — Encounter: Payer: Self-pay | Admitting: Physical Therapy

## 2016-05-11 ENCOUNTER — Other Ambulatory Visit: Payer: Self-pay | Admitting: Internal Medicine

## 2016-05-11 DIAGNOSIS — I10 Essential (primary) hypertension: Secondary | ICD-10-CM

## 2016-05-13 ENCOUNTER — Encounter: Payer: Self-pay | Admitting: Internal Medicine

## 2016-05-18 ENCOUNTER — Ambulatory Visit (INDEPENDENT_AMBULATORY_CARE_PROVIDER_SITE_OTHER): Payer: Self-pay | Admitting: Internal Medicine

## 2016-05-18 ENCOUNTER — Encounter: Payer: Self-pay | Admitting: Internal Medicine

## 2016-05-18 VITALS — BP 128/70 | HR 74 | Temp 97.6°F | Ht 61.0 in | Wt 277.5 lb

## 2016-05-18 DIAGNOSIS — Z79899 Other long term (current) drug therapy: Secondary | ICD-10-CM

## 2016-05-18 DIAGNOSIS — Z1231 Encounter for screening mammogram for malignant neoplasm of breast: Secondary | ICD-10-CM

## 2016-05-18 DIAGNOSIS — E039 Hypothyroidism, unspecified: Secondary | ICD-10-CM

## 2016-05-18 DIAGNOSIS — Z8639 Personal history of other endocrine, nutritional and metabolic disease: Secondary | ICD-10-CM

## 2016-05-18 DIAGNOSIS — R7303 Prediabetes: Secondary | ICD-10-CM

## 2016-05-18 DIAGNOSIS — I10 Essential (primary) hypertension: Secondary | ICD-10-CM

## 2016-05-18 DIAGNOSIS — Z0000 Encounter for general adult medical examination without abnormal findings: Secondary | ICD-10-CM

## 2016-05-18 LAB — POCT GLYCOSYLATED HEMOGLOBIN (HGB A1C): Hemoglobin A1C: 5.3

## 2016-05-18 LAB — GLUCOSE, CAPILLARY: Glucose-Capillary: 127 mg/dL — ABNORMAL HIGH (ref 65–99)

## 2016-05-18 MED ORDER — HYDROCHLOROTHIAZIDE 25 MG PO TABS: 25.0000 mg | ORAL_TABLET | Freq: Every day | ORAL | 11 refills | Status: DC

## 2016-05-18 NOTE — Assessment & Plan Note (Signed)
Mammogram: Ordered FOBT cards: Ordered

## 2016-05-18 NOTE — Assessment & Plan Note (Signed)
Well controlled on Levothyroxine 50 mcg daily. Last TSH 4.4 in April 2017.  Plan: -Continue levothyroxine 50 mcg daily -Repeat TSH in April 2018

## 2016-05-18 NOTE — Patient Instructions (Signed)
TAKE HYDROCHLOROTHIAZIDE 25 MG ( PILLS OF THE 12.5 MG TABLETS) ONCE A DAY UNTIL YOU RUN OUT. I HAVE ORDERED A NEW RX FOR YOU FOR THE 25 MG TABLETS AFTER.  IF YOUR BLOOD PRESSURE REMAINS ELEVATED, LET us KNOW. IF YOUR BLOOD PRESSURE IS LOWER THAN 100/60 OR YOU BECOME LIGHTHEADED, LET us KNOW.  CONTINUE ALL OTHER MEDICATIONS THE SAME.  PLEASE USE AND RETURN THE STOOL CARDS.  WE WILL SCHEDULE YOUR MAMMOGRAM.  FOLLOW UP IN 3 MONTHS.

## 2016-05-18 NOTE — Progress Notes (Signed)
    CC: HTN  HPI: [name] is a 55 y.o. female with PMHx of HTN, hepatic steatosis, hypothyroidism, HLD and panic disorder who presents to the clinic for follow up for HTN. Please see problem oriented assessment and plan for more information.  Past Medical History:  Diagnosis Date  . Allergic rhinitis   . Anemia, iron deficiency   . Anginal pain [facility])    last cardiologist visit 2010 - dx/ed with anxiety instead of cardiac pain  . Arthritis   . Atypical chest pain    Negative cardiolite at [facility] cards 11/07  . CHOLECYSTECTOMY, HX OF    Annotation: 2000 Qualifier: Diagnosis of  By: Eddie Dibbles MD, Bhakti    . Elevated BP   . History of blood transfusion 03/2008   Requried 2u prbc; menometrorrhagia, uterine fibroids.  . Hypertension   . Hypertriglyceridemia    164 - 2/11  . Hypothyroidism   . Insomnia   . Obesity    250 lbs 8/11  . Panic disorder 11/05  . Perirectal abscess 11/14/2011  . Shortness of breath 05/18/2012   w/exertion"    Review of Systems: A complete ROS was negative except as noted in HPI.   Physical Exam: Vitals:   05/18/16 1030  BP: 128/70  Pulse: 74  Temp: 97.6 F (36.4 C)  TempSrc: Oral  SpO2: 99%  Weight: 277 lb 8 oz (125.9 kg)  Height: 5\' 1"  (1.549 m)   General: Vital signs reviewed.  Patient is obese, in no acute distress and cooperative with exam.  Neck: Supple, trachea midline, increased neck circumference, no carotid bruit present.  Cardiovascular: RRR, S1 normal, S2 normal, no murmurs, gallops, or rubs. Pulmonary/Chest: Clear to auscultation bilaterally, no wheezes, rales, or rhonchi. Abdominal: Soft, non-tender, non-distended, BS + Extremities: Trace lower extremity edema bilaterally Skin: Warm, dry and intact. No rashes or erythema. Psychiatric: Normal mood and affect. speech and behavior is normal. Cognition and memory are normal.   Assessment & Plan:  See encounters tab for problem based medical decision making. Patient discussed  with Dr. Beryle Beams

## 2016-05-18 NOTE — Progress Notes (Signed)
Medicine attending: Medical history, presenting problems, physical findings, and medications, reviewed with resident physician Dr Alexa Burns on the day of the patient visit and I concur with her evaluation and management plan. 

## 2016-05-18 NOTE — Assessment & Plan Note (Signed)
BP Readings from Last 3 Encounters:  05/18/16 128/70  03/06/16 130/73  01/22/16 110/70    Lab Results  Component Value Date   NA 137 03/06/2016   K 4.0 03/06/2016   CREATININE 1.21 (H) 03/06/2016    Assessment: Blood pressure control:  At goal, however, BP recordings at home show patient is persistently hypertensive >140/80. Readings range from 120 to 180s daily for the last one months. Progress toward BP goal:   Likely uncontrolled given ambulatory BP measurements Comments: Compliant with Lisinopril 20 mg daily and HCTZ 12.5 mg daily  Plan: Medications:  Increase HCTZ to 25 mg daily and continue lisinopril 20 mg daily. Other plans: Follow up 3 months. Continue daily BP recordings, patient will let us know if it remains persistently elevated or if she becomes lightheaded or has consistent BP <100/60.

## 2016-05-18 NOTE — Progress Notes (Signed)
Pt called and informed to call the mammogram scholarship @ [phone] who will schedule her mammogram since she does not have insurance.

## 2016-05-18 NOTE — Assessment & Plan Note (Signed)
Patient has a history of pre-diabetes. Last A1c checked was 5.2 in 2014. Repeat A1c today 5.3.  Plan: -No evidence of pre-diabetes

## 2016-05-28 ENCOUNTER — Other Ambulatory Visit (INDEPENDENT_AMBULATORY_CARE_PROVIDER_SITE_OTHER): Payer: Self-pay

## 2016-05-28 ENCOUNTER — Encounter: Payer: Self-pay | Admitting: Internal Medicine

## 2016-05-28 DIAGNOSIS — Z1211 Encounter for screening for malignant neoplasm of colon: Secondary | ICD-10-CM

## 2016-05-28 DIAGNOSIS — Z0000 Encounter for general adult medical examination without abnormal findings: Secondary | ICD-10-CM

## 2016-05-28 LAB — POC HEMOCCULT BLD/STL (HOME/3-CARD/SCREEN)
Card #2 Fecal Occult Blod, POC: NEGATIVE
Card #3 Fecal Occult Blood, POC: NEGATIVE
Fecal Occult Blood, POC: NEGATIVE

## 2016-06-08 ENCOUNTER — Other Ambulatory Visit: Payer: Self-pay | Admitting: Internal Medicine

## 2016-06-08 DIAGNOSIS — Z1231 Encounter for screening mammogram for malignant neoplasm of breast: Secondary | ICD-10-CM

## 2016-06-18 ENCOUNTER — Ambulatory Visit: Payer: Self-pay

## 2016-07-12 ENCOUNTER — Other Ambulatory Visit: Payer: Self-pay | Admitting: Internal Medicine

## 2016-07-12 DIAGNOSIS — F410 Panic disorder [episodic paroxysmal anxiety] without agoraphobia: Secondary | ICD-10-CM

## 2016-07-15 ENCOUNTER — Telehealth: Payer: Self-pay | Admitting: Internal Medicine

## 2016-07-15 NOTE — Telephone Encounter (Signed)
APT. REMINDER CALL, LMTCB °

## 2016-07-16 ENCOUNTER — Ambulatory Visit (INDEPENDENT_AMBULATORY_CARE_PROVIDER_SITE_OTHER): Payer: Self-pay | Admitting: Internal Medicine

## 2016-07-16 VITALS — BP 114/71 | HR 64 | Temp 97.7°F | Wt 280.7 lb

## 2016-07-16 DIAGNOSIS — R197 Diarrhea, unspecified: Secondary | ICD-10-CM

## 2016-07-16 DIAGNOSIS — R1032 Left lower quadrant pain: Secondary | ICD-10-CM

## 2016-07-16 DIAGNOSIS — R61 Generalized hyperhidrosis: Secondary | ICD-10-CM

## 2016-07-16 DIAGNOSIS — I10 Essential (primary) hypertension: Secondary | ICD-10-CM

## 2016-07-16 DIAGNOSIS — N10 Acute tubulo-interstitial nephritis: Secondary | ICD-10-CM

## 2016-07-16 DIAGNOSIS — R6883 Chills (without fever): Secondary | ICD-10-CM

## 2016-07-16 DIAGNOSIS — M549 Dorsalgia, unspecified: Secondary | ICD-10-CM

## 2016-07-16 DIAGNOSIS — R8299 Other abnormal findings in urine: Secondary | ICD-10-CM

## 2016-07-16 DIAGNOSIS — Z0000 Encounter for general adult medical examination without abnormal findings: Secondary | ICD-10-CM

## 2016-07-16 DIAGNOSIS — Z87440 Personal history of urinary (tract) infections: Secondary | ICD-10-CM

## 2016-07-16 DIAGNOSIS — R110 Nausea: Secondary | ICD-10-CM

## 2016-07-16 DIAGNOSIS — Z79899 Other long term (current) drug therapy: Secondary | ICD-10-CM

## 2016-07-16 DIAGNOSIS — R350 Frequency of micturition: Secondary | ICD-10-CM

## 2016-07-16 LAB — POCT URINALYSIS DIPSTICK
BILIRUBIN UA: NEGATIVE
Blood, UA: NEGATIVE
GLUCOSE UA: NEGATIVE
Ketones, UA: NEGATIVE
LEUKOCYTES UA: NEGATIVE
NITRITE UA: NEGATIVE
Protein, UA: NEGATIVE
Spec Grav, UA: 1.015
Urobilinogen, UA: 0.2
pH, UA: 5

## 2016-07-16 MED ORDER — SULFAMETHOXAZOLE-TRIMETHOPRIM 800-160 MG PO TABS: 1.0000 | ORAL_TABLET | Freq: Two times a day (BID) | ORAL | 0 refills | Status: DC

## 2016-07-16 NOTE — Assessment & Plan Note (Signed)
Symptoms began 2 weeks ago and has associated urinary frequency, chills, sweats, nausea, abdominal pain, dark colored urine and some diarrhea. She has left sided flank pain. No fever, emesis, burning with urination, or hematuria. She says this feels like a prior UTI.  Symptoms consistent with acute cystitis, probable mild pyelonephritis. She is currently stable and can be treated as an outpatient. Will send urine studies and empirically treat with Bactrim DS. -Start Bactrim DS BID for 7 days -f/u UA, Urine Cx, CBC

## 2016-07-16 NOTE — Patient Instructions (Addendum)
It was a pleasure to see you again [name].  It sounds like you have an early kidney infection or UTI. I will send a prescription for an antibiotic called Bactrim to take twice a day for 1 week. Please call us if you have any problems taking this medication.  We will check your urine and a blood test today. I will call you if we need to change the antibiotics.  We will give the flu shot on your next visit.

## 2016-07-16 NOTE — Progress Notes (Signed)
Medicine attending: Medical history, presenting problems, physical findings, and medications, reviewed with resident physician Dr Vishal Patel on the day of the patient visit and I concur with his evaluation and management plan. 

## 2016-07-16 NOTE — Progress Notes (Signed)
   CC: Left sided back pain and urinary frequency  HPI:  [name] is a 55 y.o. female with PMH as listed below who presents with complaint of left sided back pain and urinary frequency.   Flank pain: Symptoms began 2 weeks ago and has associated urinary frequency, chills, sweats, nausea, abdominal pain, dark colored urine and some diarrhea. She has left sided flank pain. No fever, emesis, burning with urination, or hematuria. She says this feels like a prior UTI.  HTN BP 114/71 today. HCTZ increased from 12.5 mg to 25 mg on last visit. Reports adherence to change as well as continuing Lisinopril 20 mg daily.   Past Medical History:  Diagnosis Date  . Allergic rhinitis   . Anemia, iron deficiency   . Anginal pain [facility])    last cardiologist visit 2010 - dx/ed with anxiety instead of cardiac pain  . Arthritis   . Atypical chest pain    Negative cardiolite at [facility] cards 11/07  . CHOLECYSTECTOMY, HX OF    Annotation: 2000 Qualifier: Diagnosis of  By: Eddie Dibbles MD, Bhakti    . Elevated BP   . History of blood transfusion 03/2008   Requried 2u prbc; menometrorrhagia, uterine fibroids.  . Hypertension   . Hypertriglyceridemia    164 - 2/11  . Hypothyroidism   . Insomnia   . Obesity    250 lbs 8/11  . Panic disorder 11/05  . Perirectal abscess 11/14/2011  . Shortness of breath 05/18/2012   w/exertion"    Review of Systems:   Review of Systems  Constitutional: Positive for chills and diaphoresis. Negative for fever.  Respiratory: Negative for shortness of breath.   Cardiovascular: Negative for chest pain.  Gastrointestinal: Positive for abdominal pain, diarrhea and nausea. Negative for vomiting.  Genitourinary: Positive for flank pain and frequency. Negative for dysuria and hematuria.  Musculoskeletal: Positive for back pain.     Physical Exam:  Vitals:   07/16/16 1022  BP: 114/71  Pulse: 64  Temp: 97.7 F (36.5 C)  TempSrc: Oral  Weight: 280 lb 11.2 oz (127.3 kg)    Physical Exam  Constitutional: She is oriented to person, place, and time. She appears well-nourished. No distress.  Cardiovascular: Normal rate and regular rhythm.   Pulmonary/Chest: Effort normal. No respiratory distress.  Abdominal: Soft. Bowel sounds are normal.  Left sided CVA tenderness  Neurological: She is alert and oriented to person, place, and time.    Assessment & Plan:   See Encounters Tab for problem based charting.  Patient discussed with Dr. Beryle Beams

## 2016-07-16 NOTE — Assessment & Plan Note (Signed)
BP 114/71 today. HCTZ increased from 12.5 mg to 25 mg on last visit. Reports adherence to change as well as continuing Lisinopril 20 mg daily. BP stable on current meds, no lightheadedness or reported low BPs. -Continue current medications

## 2016-07-16 NOTE — Assessment & Plan Note (Signed)
Will give flu shot next visit.

## 2016-07-17 LAB — URINALYSIS, ROUTINE W REFLEX MICROSCOPIC
BILIRUBIN UA: NEGATIVE
GLUCOSE, UA: NEGATIVE
Ketones, UA: NEGATIVE
Leukocytes, UA: NEGATIVE
NITRITE UA: NEGATIVE
PH UA: 5.5 (ref 5.0–7.5)
Protein, UA: NEGATIVE
RBC UA: NEGATIVE
Specific Gravity, UA: 1.015 (ref 1.005–1.030)
UUROB: 0.2 mg/dL (ref 0.2–1.0)

## 2016-07-17 LAB — CBC
HEMATOCRIT: 39 % (ref 34.0–46.6)
Hemoglobin: 13.2 g/dL (ref 11.1–15.9)
MCH: 30.3 pg (ref 26.6–33.0)
MCHC: 33.8 g/dL (ref 31.5–35.7)
MCV: 90 fL (ref 79–97)
Platelets: 277 10*3/uL (ref 150–379)
RBC: 4.35 x10E6/uL (ref 3.77–5.28)
RDW: 13.8 % (ref 12.3–15.4)
WBC: 8.5 10*3/uL (ref 3.4–10.8)

## 2016-07-18 LAB — URINE CULTURE: ORGANISM ID, BACTERIA: NO GROWTH

## 2016-08-19 ENCOUNTER — Encounter: Payer: Self-pay | Admitting: Internal Medicine

## 2016-08-20 ENCOUNTER — Telehealth: Payer: Self-pay | Admitting: Internal Medicine

## 2016-08-20 ENCOUNTER — Ambulatory Visit (INDEPENDENT_AMBULATORY_CARE_PROVIDER_SITE_OTHER): Payer: Self-pay | Admitting: Internal Medicine

## 2016-08-20 ENCOUNTER — Encounter: Payer: Self-pay | Admitting: Internal Medicine

## 2016-08-20 VITALS — BP 137/60 | HR 70 | Temp 98.5°F | Wt 284.9 lb

## 2016-08-20 DIAGNOSIS — Z79899 Other long term (current) drug therapy: Secondary | ICD-10-CM

## 2016-08-20 DIAGNOSIS — I10 Essential (primary) hypertension: Secondary | ICD-10-CM

## 2016-08-20 DIAGNOSIS — Z23 Encounter for immunization: Secondary | ICD-10-CM

## 2016-08-20 DIAGNOSIS — E039 Hypothyroidism, unspecified: Secondary | ICD-10-CM

## 2016-08-20 DIAGNOSIS — S39012A Strain of muscle, fascia and tendon of lower back, initial encounter: Secondary | ICD-10-CM

## 2016-08-20 DIAGNOSIS — Z0000 Encounter for general adult medical examination without abnormal findings: Secondary | ICD-10-CM

## 2016-08-20 DIAGNOSIS — M79604 Pain in right leg: Secondary | ICD-10-CM

## 2016-08-20 DIAGNOSIS — M545 Low back pain: Secondary | ICD-10-CM

## 2016-08-20 HISTORY — DX: Strain of muscle, fascia and tendon of lower back, initial encounter: S39.012A

## 2016-08-20 MED ORDER — NAPROXEN 500 MG PO TBEC: 500.0000 mg | DELAYED_RELEASE_TABLET | Freq: Four times a day (QID) | ORAL | 0 refills | Status: DC | PRN

## 2016-08-20 NOTE — Assessment & Plan Note (Signed)
Patient with 2 weeks low back pain radiating down right leg which began when she was walking. Feels like sharp/stabbing sensation without numbness or tingling. ROM limited due to pain. This is the first episode. Pain is worse with movement, relieved with rest. She has tried Ibuprofen with some relief. She denies any fevers, chills, loss of bowel/bladder control, falls, saddle anaesthesia.  Pain likely acute muscle strain. She has no alarm symptoms to indicate imaging at this time. Will treat with conservative measures including NSAIDs and activity as tolerated. -Naproxen 500 mg q6h prn for 1 week -Tylenol 1000 mg q8h prn -Activity as tolerated -Counseled on weight loss and dietary changes

## 2016-08-20 NOTE — Assessment & Plan Note (Signed)
Flu shot given today

## 2016-08-20 NOTE — Assessment & Plan Note (Signed)
Takes Synthroid 50 mcg daily. Last TSH 4.460.  Tolerating this dosing. Will continue Synthroid 50 mcg daily.

## 2016-08-20 NOTE — Progress Notes (Signed)
   CC: Right leg pain  HPI:  [name] is a 55 y.o. female with PMH as listed below who presents for follow up management for HTN and acute right leg pain.  Right leg pain: Patient with 2 weeks low back pain radiating down right leg which began when she was walking. Feels like sharp/stabbing sensation without numbness or tingling. ROM limited due to pain. This is the first episode. Pain is worse with movement, relieved with rest. She has tried Ibuprofen with some relief. She denies any fevers, chills, loss of bowel/bladder control, falls, saddle anaesthesia.  HTN: BP 137/60. She reports adherence to Lisinopril 20 mg daily and HCTZ 25 mg daily.  Hypothyroidism: Takes Synthroid 50 mcg daily. Last TSH 4.460.  Preventative Health Care: Flu shot given today.  Past Medical History:  Diagnosis Date  . Allergic rhinitis   . Anemia, iron deficiency   . Anginal pain [facility])    last cardiologist visit 2010 - dx/ed with anxiety instead of cardiac pain  . Arthritis   . Atypical chest pain    Negative cardiolite at [facility] cards 11/07  . CHOLECYSTECTOMY, HX OF    Annotation: 2000 Qualifier: Diagnosis of  By: Eddie Dibbles MD, Bhakti    . Elevated BP   . History of blood transfusion 03/2008   Requried 2u prbc; menometrorrhagia, uterine fibroids.  . Hypertension   . Hypertriglyceridemia    164 - 2/11  . Hypothyroidism   . Insomnia   . Obesity    250 lbs 8/11  . Panic disorder 11/05  . Perirectal abscess 11/14/2011  . Shortness of breath 05/18/2012   w/exertion"    Review of Systems:   Review of Systems  Constitutional: Negative for chills, fever and weight loss.  Gastrointestinal:       No bowel/bladder incontinence  Musculoskeletal: Positive for back pain.       Low back pain, right leg pain, decreased ROM 2/2 to pain  Skin: Negative for rash.  Neurological: Negative for sensory change, focal weakness and weakness.     Physical Exam:  Vitals:   08/20/16 1539  BP: 137/60    Pulse: 70  Temp: 98.5 F (36.9 C)  TempSrc: Oral  SpO2: 99%  Weight: 284 lb 14.4 oz (129.2 kg)   Physical Exam  Constitutional: She appears well-developed and well-nourished. No distress.  Pleasant obese woman  Cardiovascular: Normal rate and regular rhythm.   Pulmonary/Chest: Effort normal. No respiratory distress. She has no wheezes.  Musculoskeletal:  TTP over lumbar spinous process. Tender to palpation of lateral right hip. Tenderness to palpation over the distal tendons of the hamstring behind the right knee. No swelling behind the knee or the calf. No increased warmth to touch. No rash. Passive and active leg raise diminished on right 2/2 pain. ROM normal on left.  Skin: Skin is warm. No rash noted. She is not diaphoretic.    Assessment & Plan:   See Encounters Tab for problem based charting.  Patient discussed with Dr. Evette Doffing

## 2016-08-20 NOTE — Assessment & Plan Note (Signed)
BP 137/60. She reports adherence to Lisinopril 20 mg daily and HCTZ 25 mg daily.  BP Readings from Last 3 Encounters:  08/20/16 137/60  07/16/16 114/71  05/18/16 128/70   BP stable. Continue current medications.

## 2016-08-20 NOTE — Telephone Encounter (Signed)
APT. REMINDER CALL, LMTCB °

## 2016-08-20 NOTE — Patient Instructions (Addendum)
It was a pleasure to see you again [name].  Your leg pain is likely from muscle strain.  We will try a medication called Naproxen 500 mg six times daily as needed for 1 week. Take this with food. Please be aware that this may cause stomach irritation, please stop taking it if this occurs.  You can also take Tylenol 1000 mg every 8 hours as needed for your pain.  Please continue activity as tolerated to prevent your muscles from stiffening up.  Please be aware that this pain may take a few weeks to recover.  We have given you the flu shot today.  Please call us if your symptoms worsen, you have loss of strength in your legs, or lose control of your bowel or bladder.     Lumbosacral Strain Lumbosacral strain is a strain of any of the parts that make up your lumbosacral vertebrae. Your lumbosacral vertebrae are the bones that make up the lower third of your backbone. Your lumbosacral vertebrae are held together by muscles and tough, fibrous tissue (ligaments).  CAUSES  A sudden blow to your back can cause lumbosacral strain. Also, anything that causes an excessive stretch of the muscles in the low back can cause this strain. This is typically seen when people exert themselves strenuously, fall, lift heavy objects, bend, or crouch repeatedly. RISK FACTORS  Physically demanding work.  Participation in pushing or pulling sports or sports that require a sudden twist of the back (tennis, golf, baseball).  Weight lifting.  Excessive lower back curvature.  Forward-tilted pelvis.  Weak back or abdominal muscles or both.  Tight hamstrings. SIGNS AND SYMPTOMS  Lumbosacral strain may cause pain in the area of your injury or pain that moves (radiates) down your leg.  DIAGNOSIS Your health care provider can often diagnose lumbosacral strain through a physical exam. In some cases, you may need tests such as X-ray exams.  TREATMENT  Treatment for your lower back injury depends on many  factors that your clinician will have to evaluate. However, most treatment will include the use of anti-inflammatory medicines. HOME CARE INSTRUCTIONS   Avoid hard physical activities (tennis, racquetball, waterskiing) if you are not in proper physical condition for it. This may aggravate or create problems.  If you have a back problem, avoid sports requiring sudden body movements. Swimming and walking are generally safer activities.  Maintain good posture.  Maintain a healthy weight.  For acute conditions, you may put ice on the injured area.  Put ice in a plastic bag.  Place a towel between your skin and the bag.  Leave the ice on for 20 minutes, 2-3 times a day.  When the low back starts healing, stretching and strengthening exercises may be recommended. SEEK MEDICAL CARE IF:  Your back pain is getting worse.  You experience severe back pain not relieved with medicines. SEEK IMMEDIATE MEDICAL CARE IF:   You have numbness, tingling, weakness, or problems with the use of your arms or legs.  There is a change in bowel or bladder control.  You have increasing pain in any area of the body, including your belly (abdomen).  You notice shortness of breath, dizziness, or feel faint.  You feel sick to your stomach (nauseous), are throwing up (vomiting), or become sweaty.  You notice discoloration of your toes or legs, or your feet get very cold. MAKE SURE YOU:   Understand these instructions.  Will watch your condition.  Will get help right away if you  are not doing well or get worse.   This information is not intended to replace advice given to you by your health care provider. Make sure you discuss any questions you have with your health care provider.   Document Released: 07/08/2005 Document Revised: 10/19/2014 Document Reviewed: 05/17/2013 Elsevier Interactive Patient Education [employer].

## 2016-08-21 NOTE — Progress Notes (Signed)
Internal Medicine Clinic Attending  Case discussed with Dr. Patel,Vishal at the time of the visit.  We reviewed the resident's history and exam and pertinent patient test results.  I agree with the assessment, diagnosis, and plan of care documented in the resident's note.  

## 2016-09-10 ENCOUNTER — Telehealth: Payer: Self-pay | Admitting: Internal Medicine

## 2016-09-10 NOTE — Telephone Encounter (Signed)
APT. REMINDER CALL, LMTCB °

## 2016-09-11 ENCOUNTER — Encounter: Payer: Self-pay | Admitting: Internal Medicine

## 2016-09-23 ENCOUNTER — Ambulatory Visit: Payer: Self-pay

## 2016-12-01 ENCOUNTER — Ambulatory Visit (INDEPENDENT_AMBULATORY_CARE_PROVIDER_SITE_OTHER): Payer: Self-pay | Admitting: Pulmonary Disease

## 2016-12-01 ENCOUNTER — Ambulatory Visit (HOSPITAL_COMMUNITY)
Admission: RE | Admit: 2016-12-01 | Discharge: 2016-12-01 | Disposition: A | Discharge status: Home / Self Care | Payer: Self-pay | Source: Ambulatory Visit | Attending: Student in an Organized Health Care Education/Training Program | Admitting: Student in an Organized Health Care Education/Training Program

## 2016-12-01 ENCOUNTER — Encounter: Payer: Self-pay | Admitting: Pulmonary Disease

## 2016-12-01 VITALS — BP 155/91 | HR 61 | Temp 98.0°F | Wt 286.8 lb

## 2016-12-01 DIAGNOSIS — Z9181 History of falling: Secondary | ICD-10-CM

## 2016-12-01 DIAGNOSIS — M24212 Disorder of ligament, left shoulder: Secondary | ICD-10-CM | POA: Insufficient documentation

## 2016-12-01 DIAGNOSIS — S46002A Unspecified injury of muscle(s) and tendon(s) of the rotator cuff of left shoulder, initial encounter: Secondary | ICD-10-CM

## 2016-12-01 DIAGNOSIS — M25512 Pain in left shoulder: Secondary | ICD-10-CM

## 2016-12-01 DIAGNOSIS — Z8739 Personal history of other diseases of the musculoskeletal system and connective tissue: Secondary | ICD-10-CM

## 2016-12-01 DIAGNOSIS — I10 Essential (primary) hypertension: Secondary | ICD-10-CM

## 2016-12-01 DIAGNOSIS — Z6841 Body Mass Index (BMI) 40.0 and over, adult: Secondary | ICD-10-CM

## 2016-12-01 DIAGNOSIS — Z87828 Personal history of other (healed) physical injury and trauma: Secondary | ICD-10-CM

## 2016-12-01 MED ORDER — NAPROXEN 500 MG PO TBEC
500.0000 mg | DELAYED_RELEASE_TABLET | Freq: Two times a day (BID) | ORAL | 0 refills | Status: DC
Start: 2016-12-01 — End: 2016-12-17

## 2016-12-01 NOTE — Patient Instructions (Addendum)
Take naproxen twice a day You may use ice on your shoulder Make an appointment in a few weeks with your surgeon if no improvement or worsening  Rotator Cuff Injury Rotator cuff injury is any type of injury to the set of muscles and tendons that make up the stabilizing unit of your shoulder. This unit holds the ball of your upper arm bone (humerus) in the socket of your shoulder blade (scapula). What are the causes? Injuries to your rotator cuff most commonly come from sports or activities that cause your arm to be moved repeatedly over your head. Examples of this include throwing, weight lifting, swimming, or racquet sports. Long lasting (chronic) irritation of your rotator cuff can cause soreness and swelling (inflammation), bursitis, and eventual damage to your tendons, such as a tear (rupture). What are the signs or symptoms? Acute rotator cuff tear:  Sudden tearing sensation followed by severe pain shooting from your upper shoulder down your arm toward your elbow.  Decreased range of motion of your shoulder because of pain and muscle spasm.  Severe pain.  Inability to raise your arm out to the side because of pain and loss of muscle power (large tears). Chronic rotator cuff tear:  Pain that usually is worse at night and may interfere with sleep.  Gradual weakness and decreased shoulder motion as the pain worsens.  Decreased range of motion. Rotator cuff tendinitis:  Deep ache in your shoulder and the outside upper arm over your shoulder.  Pain that comes on gradually and becomes worse when lifting your arm to the side or turning it inward.  How is this treated? Chronic tear:  Medicine for pain, such as acetaminophen or ibuprofen.  Physical therapy and range-of-motion exercises may be helpful in maintaining shoulder function and strength.  Steroid injections into your shoulder joint.  Surgical repair of the rotator cuff if the injury does not heal with noninvasive  treatment. Acute tear:  Anti-inflammatory medicines such as ibuprofen and naproxen to help reduce pain and swelling.  Surgery may be considered within a few weeks, especially in younger, active people, to return the shoulder to full function.  Indications for surgical treatment include the following:  Age younger than 47 years.  Rotator cuff tears that are complete.  Physical therapy, rest, and anti-inflammatory medicines have been used for 6-8 weeks, with no improvement.  Employment or sporting activity that requires constant shoulder use. Tendinitis:  Anti-inflammatory medicines such as ibuprofen and naproxen to help reduce pain and swelling.  A sling to help support your arm and rest your rotator cuff muscles. Long-term use of a sling is not advised. It may cause significant stiffening of the shoulder joint.  Severe tendinitis may require:  Steroid injections into your shoulder joint.  Physical therapy.  Surgery. Follow these instructions at home:  Apply ice to your injury:  Put ice in a plastic bag.  Place a towel between your skin and the bag.  Leave the ice on for 20 minutes, 2-3 times a day.  You may want to sleep on several pillows or in a recliner at night to lessen swelling and pain.  Only take over-the-counter or prescription medicines for pain, discomfort, or fever as directed by your health care provider.  Do simple hand squeezing exercises with a soft rubber ball to decrease hand swelling.  Gently move your arm in all directions to keep it from getting stiff Contact a health care provider if:  Your shoulder pain increases, or new pain or numbness  develops in your arm, hand, or fingers.  Your hand or fingers are colder than your other hand. Get help right away if:  Your arm, hand, or fingers are numb or tingling.  Your arm, hand, or fingers are increasingly swollen and painful, or they turn white or blue. This information is not intended to replace  advice given to you by your health care provider. Make sure you discuss any questions you have with your health care provider. Document Released: 09/25/2000 Document Revised: 03/05/2016 Document Reviewed: 05/10/2013 Elsevier Interactive Patient Education  2017 [employer].

## 2016-12-01 NOTE — Progress Notes (Signed)
   CC: left shoulder pain  HPI:  [name] is a 56 y.o. woman with history as noted below here with left shoulder pain  Fell on shoulder three days ago. Was trying to step over board and foot got caught on it. No LH, CP, SOB. Had surgery on left shoulder for torn rotator cuff. Dr. Erlinda Hong did her surgery May 26. She had full range of motion after that surgery.     Past Medical History:  Diagnosis Date  . Allergic rhinitis   . Anemia, iron deficiency   . Anginal pain [facility])    last cardiologist visit 2010 - dx/ed with anxiety instead of cardiac pain  . Arthritis   . Atypical chest pain    Negative cardiolite at [facility] cards 11/07  . CHOLECYSTECTOMY, HX OF    Annotation: 2000 Qualifier: Diagnosis of  By: Eddie Dibbles MD, Bhakti    . Elevated BP   . History of blood transfusion 03/2008   Requried 2u prbc; menometrorrhagia, uterine fibroids.  . Hypertension   . Hypertriglyceridemia    164 - 2/11  . Hypothyroidism   . Insomnia   . Obesity    250 lbs 8/11  . Panic disorder 11/05  . Perirectal abscess 11/14/2011  . Shortness of breath 05/18/2012   w/exertion"    Review of Systems:   No fevers or chills No nausea or vomiting  Physical Exam:  Vitals:   12/01/16 1033 12/01/16 1035  BP: (!) 161/83 (!) 155/91  Pulse: 65 61  Temp: 98 F (36.7 C)   TempSrc: Oral   SpO2: 100%   Weight: 286 lb 12.8 oz (130.1 kg)    General Apperance: NAD HEENT: Normocephalic, atraumatic, anicteric sclera Neck: Supple, trachea midline Lungs: Clear to auscultation bilaterally. No wheezes, rhonchi or rales. Breathing comfortably Heart: Regular rate and rhythm, no murmur/rub/gallop Abdomen: Soft, nontender, nondistended, no rebound/guarding Extremities: Warm and well perfused, left shoulder tender to palpation most in anterior aspect, active range of motion to 30 forward flexion, less than 10 and extension and 30 and abduction - limited by pain. Positive empty can sign Skin: No rashes or  lesions Neurologic: Alert and interactive. No gross deficits.  Assessment & Plan:   See Encounters Tab for problem based charting.  Patient seen with Dr. Evette Doffing

## 2016-12-02 DIAGNOSIS — S46002A Unspecified injury of muscle(s) and tendon(s) of the rotator cuff of left shoulder, initial encounter: Secondary | ICD-10-CM | POA: Insufficient documentation

## 2016-12-02 HISTORY — DX: Unspecified injury of muscle(s) and tendon(s) of the rotator cuff of left shoulder, initial encounter: S46.002A

## 2016-12-02 NOTE — Assessment & Plan Note (Signed)
Assessment: Blood pressure 155/91. Likely exacerbated by pain.  Plan: Continue hydrochlorothiazide 25 mg and lisinopril 20 mg daily. Follow-up in 4-6 weeks

## 2016-12-02 NOTE — Progress Notes (Signed)
Internal Medicine Clinic Attending  I saw and evaluated the patient.  I personally confirmed the key portions of the history and exam documented by Dr. Randell Patient and I reviewed pertinent patient test results.  The assessment, diagnosis, and plan were formulated together and I agree with the documentation in the resident's note.  There is point tenderness over the anterior shoulder, significant pain and weakness with external rotation and abduction. POCUS of the shoulder was a difficult study because of her obesity. I was not able to visualize the subscapularis portion of the rotator cuff, and given the exam findings I think that is most consistent with a full thickness tear. Plan for conservative therapy, referral back to Dr. Erlinda Hong given recent surgery on that shoulder, and plain films to rule out bone injury.   Anterior shoulder, lots of soft tissue, humerus at the bottom of the image and no subscap tendon can be seen attaching to the humerus. Hypoechoic area likely associate fluid collection.

## 2016-12-02 NOTE — Assessment & Plan Note (Signed)
Assessment: Previously had arthroscopic left rotator cuff repair, distal clavicle excision, subacromial decompression with acromioplasty, debridement of the shoulder joint, labrum, biceps tendon, synovitis for rotator cuff tear and impingement syndrome in May 2017. Fell on left shoulder 3 days ago. Point-of-care ultrasound demonstrates subscapularis tear on left shoulder. XR with widened with subacromial joint space. No acute fracture deformity.  Plan:  Naproxen 500 mg twice a day Gentle range of motion exercises May use ice Discussed with her following up with her surgeon in the next few weeks if no improvement or worsening of her pain

## 2016-12-17 ENCOUNTER — Ambulatory Visit (HOSPITAL_COMMUNITY)
Admission: RE | Admit: 2016-12-17 | Discharge: 2016-12-17 | Disposition: A | Discharge status: Home / Self Care | Payer: Self-pay | Source: Ambulatory Visit | Attending: Oncology | Admitting: Oncology

## 2016-12-17 ENCOUNTER — Ambulatory Visit (INDEPENDENT_AMBULATORY_CARE_PROVIDER_SITE_OTHER): Payer: Self-pay | Admitting: Internal Medicine

## 2016-12-17 ENCOUNTER — Telehealth: Payer: Self-pay

## 2016-12-17 VITALS — BP 130/48 | HR 81 | Temp 98.3°F | Ht 61.0 in | Wt 291.0 lb

## 2016-12-17 DIAGNOSIS — M545 Low back pain, unspecified: Secondary | ICD-10-CM

## 2016-12-17 DIAGNOSIS — M5116 Intervertebral disc disorders with radiculopathy, lumbar region: Secondary | ICD-10-CM | POA: Insufficient documentation

## 2016-12-17 DIAGNOSIS — Z79899 Other long term (current) drug therapy: Secondary | ICD-10-CM

## 2016-12-17 DIAGNOSIS — N3942 Incontinence without sensory awareness: Secondary | ICD-10-CM

## 2016-12-17 DIAGNOSIS — E039 Hypothyroidism, unspecified: Secondary | ICD-10-CM

## 2016-12-17 DIAGNOSIS — R202 Paresthesia of skin: Secondary | ICD-10-CM

## 2016-12-17 DIAGNOSIS — Z5181 Encounter for therapeutic drug level monitoring: Secondary | ICD-10-CM

## 2016-12-17 HISTORY — DX: Incontinence without sensory awareness: N39.42

## 2016-12-17 LAB — POCT URINALYSIS DIPSTICK
Bilirubin, UA: NEGATIVE
Blood, UA: NEGATIVE
Glucose, UA: NEGATIVE
Ketones, UA: NEGATIVE
LEUKOCYTES UA: NEGATIVE
Nitrite, UA: NEGATIVE
PROTEIN UA: 30
SPEC GRAV UA: 1.015
UROBILINOGEN UA: 0.2
pH, UA: 6.5

## 2016-12-17 MED ORDER — CYCLOBENZAPRINE HCL 5 MG PO TABS
5.0000 mg | ORAL_TABLET | Freq: Two times a day (BID) | ORAL | 0 refills | Status: DC | PRN
Start: 2016-12-17 — End: 2017-07-20

## 2016-12-17 MED ORDER — NAPROXEN 500 MG PO TBEC: 500.0000 mg | DELAYED_RELEASE_TABLET | Freq: Two times a day (BID) | ORAL | 0 refills | Status: DC

## 2016-12-17 MED ORDER — PREDNISONE 10 MG (21) PO TBPK: ORAL_TABLET | ORAL | 0 refills | Status: DC

## 2016-12-17 NOTE — Patient Instructions (Addendum)
For your back pain: --we will have you get x rays and MRI of your back --take prednisone dose pack as instructed on pill bottle --I have prescribed also a muscle relaxant - this may cause drowsiness especially with your other medicines; take only one once a day at first to see how you react. You can take up to one twice a day. --continue using heat up to 5-6 times a day

## 2016-12-17 NOTE — Progress Notes (Signed)
CC: back pain  HPI:  [name] is a 56 y.o. with a PMH listed below presenting for acute low back pain.  Patient states that she is having a 2 week history of low back pain that is different from her chronic pain and has not been improving with treatment at home. It is more intense, dull, worsened with movement, unchanged with position change. She sometimes has radiation to her legs which feels more like parasthesias. She denies weakness. She does not report an associated fall, trauma, or overuse injury. She has used ibuprofen 800mg  BID, and heat application about twice a day. She denies bowel incontinence. Patient endorses about the same time length history of urinary leakage; she endorses noticing wet stain on chairs when she wasn't aware of leaking; she also endorses stress incontinence. She endorses increased frequency and urgency, with feeling of incomplete emptying. She denies dysuria or vaginal discharge.   Patient's [place] provider has asked for an updated TSH; patient states she has been compliant with her synthroid 52mcg daily. She does endorse more cold intolerance and itchy skin.   Please see problem based Assessment and Plan for status of patients chronic conditions.  Past Medical History:  Diagnosis Date  . Allergic rhinitis   . Anemia, iron deficiency   . Anginal pain [facility])    last cardiologist visit 2010 - dx/ed with anxiety instead of cardiac pain  . Arthritis   . Atypical chest pain    Negative cardiolite at [facility] cards 11/07  . CHOLECYSTECTOMY, HX OF    Annotation: 2000 Qualifier: Diagnosis of  By: Eddie Dibbles MD, Bhakti    . Elevated BP   . History of blood transfusion 03/2008   Requried 2u prbc; menometrorrhagia, uterine fibroids.  . Hypertension   . Hypertriglyceridemia    164 - 2/11  . Hypothyroidism   . Insomnia   . Obesity    250 lbs 8/11  . Panic disorder 11/05  . Perirectal abscess 11/14/2011  . Shortness of breath 05/18/2012   w/exertion"    Review of  Systems:   Review of Systems  Constitutional: Negative for chills, fever and weight loss.  Cardiovascular: Negative for leg swelling.  Gastrointestinal: Negative for abdominal pain, constipation and diarrhea.  Genitourinary: Positive for frequency and urgency. Negative for dysuria, flank pain and hematuria.       Urine leakage  Musculoskeletal: Positive for back pain. Negative for falls.  Skin: Positive for itching. Negative for rash.  Neurological: Positive for tingling. Negative for sensory change, focal weakness, loss of consciousness and headaches.    Physical Exam:  Vitals:   12/17/16 1046  BP: (!) 130/48  Pulse: 81  Temp: 98.3 F (36.8 C)  TempSrc: Oral  SpO2: 98%  Weight: 291 lb (132 kg)  Height: 5\' 1"  (1.549 m)   Physical Exam  Constitutional: She is oriented to person, place, and time. She appears well-developed and well-nourished. No distress.  HENT:  Head: Normocephalic and atraumatic.  Eyes: EOM are normal.  Neck: Normal range of motion.  Cardiovascular: Normal rate, regular rhythm, normal heart sounds and intact distal pulses.  Exam reveals no gallop and no friction rub.   No murmur heard. Pulmonary/Chest: Effort normal. She has no wheezes. She has no rales.  Abdominal: Soft. Bowel sounds are normal. She exhibits no distension. There is no tenderness.  No suprapubic tenderness  Musculoskeletal:  Lumbar spine tenderness; paraspinal tenderness. 4/5 strength in bil LE which is associated with pain. Sensation intact; reflexes not ilicited  though complicated by habitus Limping gait towards left  Neurological: She is alert and oriented to person, place, and time. No sensory deficit. She exhibits normal muscle tone.  Skin: Skin is warm and dry. Capillary refill takes less than 2 seconds. No rash noted. She is not diaphoretic. No erythema.  Psychiatric: She has a normal mood and affect. Her behavior is normal. Judgment and thought content normal.    Assessment &  Plan:   See Encounters Tab for problem based charting.   Patient seen with Dr. Verdie Drown, MD Internal Medicine PGY1

## 2016-12-17 NOTE — Telephone Encounter (Signed)
Requesting to speak with a nurse about meds. Please call back.  

## 2016-12-18 LAB — MICROSCOPIC EXAMINATION: Casts: NONE SEEN /lpf

## 2016-12-18 LAB — URINALYSIS, COMPLETE
Bilirubin, UA: NEGATIVE
GLUCOSE, UA: NEGATIVE
Ketones, UA: NEGATIVE
Leukocytes, UA: NEGATIVE
Nitrite, UA: NEGATIVE
PH UA: 6 (ref 5.0–7.5)
Protein, UA: NEGATIVE
RBC, UA: NEGATIVE
SPEC GRAV UA: 1.013 (ref 1.005–1.030)
Urobilinogen, Ur: 0.2 mg/dL (ref 0.2–1.0)

## 2016-12-18 LAB — TSH: TSH: 10.15 u[IU]/mL — ABNORMAL HIGH (ref 0.450–4.500)

## 2016-12-18 MED ORDER — LEVOTHYROXINE SODIUM 75 MCG PO TABS: 75.0000 ug | ORAL_TABLET | Freq: Every day | ORAL | 2 refills | Status: DC

## 2016-12-18 NOTE — Assessment & Plan Note (Addendum)
Takes synthroid 61mcg daily. She endorses cold intolerance and itchy skin. [facility] also wanted an updated TSH and her last check was in 2016.   Plan: --TSH - elevated to 10 --increase synthroid to 94mcg daily --called patient about results and change in dosage. She agrees to set up appointment in 6-8 weeks for recheck of TSH

## 2016-12-19 ENCOUNTER — Encounter: Payer: Self-pay | Admitting: Internal Medicine

## 2016-12-19 NOTE — Assessment & Plan Note (Addendum)
Patient states that she is having a 2 week history of low back pain that is different from her chronic pain and has not been improving with treatment at home. It is more intense, dull, worsened with movement, unchanged with position change. She sometimes has radiation to her legs which feels more like parasthesias. She denies weakness. She does not report an associated fall, trauma, or overuse injury. She has used ibuprofen 800mg  BID, and heat application about twice a day. She denies bowel incontinence. Patient endorses about the same time length history of urinary leakage.  Concern for herniated disc with possible cauda equina in setting of some urinary incontinence.  Plan: --UA - negative --lumbar Xray - degenerative changes with L3-4 and L4-5 spurring --MRI lumbar spine --prednisone --flexeril --tylenol --heat/ice --f/u in 1 week if not improving, worsening

## 2016-12-19 NOTE — Assessment & Plan Note (Addendum)
Patient endorses about 2 week h/o urinary leakage; she endorses noticing wet stain on chairs when she wasn't aware of leaking; she also endorses stress incontinence. She endorses increased frequency and urgency, with feeling of incomplete emptying. She denies dysuria or vaginal discharge.   Exam is negative for CVA tenderness, suprapubic tenderness. There is concern for cauda equina in setting of lumbar pain and mild LE weakness. Other differentials include UTI, pelvic floor muscle weakness.  Plan: --UA w/ microscopy - negative --MRI lumbar spine

## 2016-12-23 NOTE — Progress Notes (Signed)
Medicine attending: Medical history, presenting problems, physical findings, and medications, reviewed with resident physician Dr Loistine Simas on the day of the patient visit and I concur with her evaluation and management plan.

## 2016-12-24 ENCOUNTER — Ambulatory Visit (INDEPENDENT_AMBULATORY_CARE_PROVIDER_SITE_OTHER): Payer: Self-pay | Admitting: Internal Medicine

## 2016-12-24 ENCOUNTER — Encounter: Payer: Self-pay | Admitting: Internal Medicine

## 2016-12-24 ENCOUNTER — Telehealth: Payer: Self-pay | Admitting: Internal Medicine

## 2016-12-24 VITALS — BP 151/90 | HR 73 | Temp 97.8°F | Ht 61.0 in | Wt 287.6 lb

## 2016-12-24 DIAGNOSIS — M545 Low back pain, unspecified: Secondary | ICD-10-CM

## 2016-12-24 DIAGNOSIS — N39498 Other specified urinary incontinence: Secondary | ICD-10-CM

## 2016-12-24 DIAGNOSIS — G8929 Other chronic pain: Secondary | ICD-10-CM

## 2016-12-24 DIAGNOSIS — Z6841 Body Mass Index (BMI) 40.0 and over, adult: Secondary | ICD-10-CM

## 2016-12-24 MED ORDER — GABAPENTIN 300 MG PO CAPS: 300.0000 mg | ORAL_CAPSULE | Freq: Three times a day (TID) | ORAL | 2 refills | Status: DC

## 2016-12-24 NOTE — Telephone Encounter (Signed)
Pt stated someone had called and spoken to her about her meds and she is doing well

## 2016-12-24 NOTE — Patient Instructions (Signed)
Start taking gabapentin 300mg  TID.   Gabapentin capsules or tablets What is this medicine? GABAPENTIN (GA ba pen tin) is used to control partial seizures in adults with epilepsy. It is also used to treat certain types of nerve pain. This medicine may be used for other purposes; ask your health care provider or pharmacist if you have questions. COMMON BRAND NAME(S): Active-PAC with Gabapentin, Gabarone, Neurontin What should I tell my health care provider before I take this medicine? They need to know if you have any of these conditions: -kidney disease -suicidal thoughts, plans, or attempt; a previous suicide attempt by you or a family member -an unusual or allergic reaction to gabapentin, other medicines, foods, dyes, or preservatives -pregnant or trying to get pregnant -breast-feeding How should I use this medicine? Take this medicine by mouth with a glass of water. Follow the directions on the prescription label. You can take it with or without food. If it upsets your stomach, take it with food.Take your medicine at regular intervals. Do not take it more often than directed. Do not stop taking except on your doctor's advice. If you are directed to break the 600 or 800 mg tablets in half as part of your dose, the extra half tablet should be used for the next dose. If you have not used the extra half tablet within 28 days, it should be thrown away. A special MedGuide will be given to you by the pharmacist with each prescription and refill. Be sure to read this information carefully each time. Talk to your pediatrician regarding the use of this medicine in children. Special care may be needed. Overdosage: If you think you have taken too much of this medicine contact a poison control center or emergency room at once. NOTE: This medicine is only for you. Do not share this medicine with others. What if I miss a dose? If you miss a dose, take it as soon as you can. If it is almost time for your  next dose, take only that dose. Do not take double or extra doses. What may interact with this medicine? Do not take this medicine with any of the following medications: -other gabapentin products This medicine may also interact with the following medications: -alcohol -antacids -antihistamines for allergy, cough and cold -certain medicines for anxiety or sleep -certain medicines for depression or psychotic disturbances -homatropine; hydrocodone -naproxen -narcotic medicines (opiates) for pain -phenothiazines like chlorpromazine, mesoridazine, prochlorperazine, thioridazine This list may not describe all possible interactions. Give your health care provider a list of all the medicines, herbs, non-prescription drugs, or dietary supplements you use. Also tell them if you smoke, drink alcohol, or use illegal drugs. Some items may interact with your medicine. What should I watch for while using this medicine? Visit your doctor or health care professional for regular checks on your progress. You may want to keep a record at home of how you feel your condition is responding to treatment. You may want to share this information with your doctor or health care professional at each visit. You should contact your doctor or health care professional if your seizures get worse or if you have any new types of seizures. Do not stop taking this medicine or any of your seizure medicines unless instructed by your doctor or health care professional. Stopping your medicine suddenly can increase your seizures or their severity. Wear a medical identification bracelet or chain if you are taking this medicine for seizures, and carry a card that lists all  your medications. You may get drowsy, dizzy, or have blurred vision. Do not drive, use machinery, or do anything that needs mental alertness until you know how this medicine affects you. To reduce dizzy or fainting spells, do not sit or stand up quickly, especially if you  are an older patient. Alcohol can increase drowsiness and dizziness. Avoid alcoholic drinks. Your mouth may get dry. Chewing sugarless gum or sucking hard candy, and drinking plenty of water will help. The use of this medicine may increase the chance of suicidal thoughts or actions. Pay special attention to how you are responding while on this medicine. Any worsening of mood, or thoughts of suicide or dying should be reported to your health care professional right away. Women who become pregnant while using this medicine may enroll in the [place] Pregnancy Registry by calling [phone]. This registry collects information about the safety of antiepileptic drug use during pregnancy. What side effects may I notice from receiving this medicine? Side effects that you should report to your doctor or health care professional as soon as possible: -allergic reactions like skin rash, itching or hives, swelling of the face, lips, or tongue -worsening of mood, thoughts or actions of suicide or dying Side effects that usually do not require medical attention (report to your doctor or health care professional if they continue or are bothersome): -constipation -difficulty walking or controlling muscle movements -dizziness -nausea -slurred speech -tiredness -tremors -weight gain This list may not describe all possible side effects. Call your doctor for medical advice about side effects. You may report side effects to FDA at 1-800-FDA-1088. Where should I keep my medicine? Keep out of reach of children. This medicine may cause accidental overdose and death if it taken by other adults, children, or pets. Mix any unused medicine with a substance like cat litter or coffee grounds. Then throw the medicine away in a sealed container like a sealed bag or a coffee can with a lid. Do not use the medicine after the expiration date. Store at room temperature between 15 and 30 degrees C (59  and 86 degrees F). NOTE: This sheet is a summary. It may not cover all possible information. If you have questions about this medicine, talk to your doctor, pharmacist, or health care provider.  2018 Elsevier/Gold Standard (2013-11-24 15:26:50)

## 2016-12-24 NOTE — Telephone Encounter (Signed)
Talked with patient she needs to do CAFA app if she is denied Medicaid, will give me a call for apt after she hears from [place]. Patient can't do app for [facility] not in [facility]

## 2016-12-24 NOTE — Progress Notes (Signed)
   CC: Lower back pain  HPI:  [name] is a 56 y.o. with past medical history as outlined below who presents to clinic for follow-up of her back pain. Please see problem was further details of patient's chronic medical issues.  Past Medical History:  Diagnosis Date  . Allergic rhinitis   . Anemia, iron deficiency   . Anginal pain [facility])    last cardiologist visit 2010 - dx/ed with anxiety instead of cardiac pain  . Arthritis   . Atypical chest pain    Negative cardiolite at [facility] cards 11/07  . CHOLECYSTECTOMY, HX OF    Annotation: 2000 Qualifier: Diagnosis of  By: Eddie Dibbles MD, Bhakti    . Elevated BP   . History of blood transfusion 03/2008   Requried 2u prbc; menometrorrhagia, uterine fibroids.  . Hypertension   . Hypertriglyceridemia    164 - 2/11  . Hypothyroidism   . Insomnia   . Obesity    250 lbs 8/11  . Panic disorder 11/05  . Perirectal abscess 11/14/2011  . Shortness of breath 05/18/2012   w/exertion"    Review of Systems:  Chronic urinary incontinence that has increased, no difficulty of controlling bowel movements, no lower extremity numbness or tingling.  Physical Exam:  Vitals:   12/24/16 1034  BP: (!) 151/90  Pulse: 73  Temp: 97.8 F (36.6 C)  TempSrc: Oral  SpO2: 100%  Weight: 287 lb 9.6 oz (130.5 kg)  Height: 5\' 1"  (1.549 m)   Physical Exam  Constitutional: Morbidly obese. appears well-developed and well-nourished. No distress.  HENT:  Head: Normocephalic and atraumatic.  Nose: Nose normal.  Cardiovascular: Normal rate, regular rhythm and normal heart sounds.  Exam reveals no gallop and no friction rub.   No murmur heard. Pulmonary/Chest: Effort normal and breath sounds normal. No respiratory distress.  has no wheezes.no rales.  Abdominal: Soft. Bowel sounds are normal.  exhibits no distension. There is no tenderness. There is no rebound and no guarding.  Neurological: alert and oriented to person, place, and time.  Skin: Skin is warm and  dry. No rash noted.  not diaphoretic. No erythema. No pallor.  Musculoskeletal: Tender to palpation of lower lumbar spine and paraspinal muscles. Barely able to lift bilateral legs off exam table due to lower back pain. Patient described lower back pain when lifting legs as a stretching feeling.  Assessment & Plan:   See Encounters Tab for problem based charting.  Patient discussed with Dr. Eppie Gibson

## 2016-12-25 NOTE — Assessment & Plan Note (Addendum)
Assessment: Patient presenting with chronic low back pain which she was last seen in clinic on March 8. She states that that pain has not improved with ibuprofen and flexeril makes her drowsy. A MRI Lumbar spine was ordered but she does not have insurance to cover this. Per [place] pt will have to wait for insurance or pay out of pocket for this. She does not have any red flag symptoms for cauda equina syndrome.   Plan: She will get insurance in April. Can wait for MRI till then, educated on red flag sx. Trial of gabapentin 300mg  TID for pain. Can place referral to sports medicine for epidural injections but will need MRI prior to this. Counseled on weight loss.

## 2016-12-26 NOTE — Progress Notes (Signed)
Case discussed with Dr. Truong at the time of the visit.  We reviewed the resident's history and exam and pertinent patient test results.  I agree with the assessment, diagnosis, and plan of care documented in the resident's note. 

## 2017-01-06 ENCOUNTER — Encounter: Payer: Self-pay | Admitting: Internal Medicine

## 2017-01-06 NOTE — Telephone Encounter (Signed)
Called pt, ask her to come in this pm, she refused, appt 3/29 at [place],  She states this has been happening often but not all the time??? She states it does not happen when she is driving??? States she is not drinking as much as she had been so it might be that or maybe she just has vertigo, could I tell which, informed her that a physician needed to examine her and determine what might be happening, she agreed to come tomorrow am

## 2017-01-07 ENCOUNTER — Ambulatory Visit (INDEPENDENT_AMBULATORY_CARE_PROVIDER_SITE_OTHER): Payer: Self-pay | Admitting: Internal Medicine

## 2017-01-07 VITALS — BP 129/63 | HR 80 | Temp 98.8°F | Ht 61.0 in | Wt 287.3 lb

## 2017-01-07 DIAGNOSIS — H8110 Benign paroxysmal vertigo, unspecified ear: Secondary | ICD-10-CM | POA: Insufficient documentation

## 2017-01-07 DIAGNOSIS — H9391 Unspecified disorder of right ear: Secondary | ICD-10-CM

## 2017-01-07 DIAGNOSIS — J3489 Other specified disorders of nose and nasal sinuses: Secondary | ICD-10-CM

## 2017-01-07 DIAGNOSIS — R110 Nausea: Secondary | ICD-10-CM

## 2017-01-07 DIAGNOSIS — R42 Dizziness and giddiness: Secondary | ICD-10-CM

## 2017-01-07 DIAGNOSIS — H8112 Benign paroxysmal vertigo, left ear: Secondary | ICD-10-CM

## 2017-01-07 HISTORY — DX: Benign paroxysmal vertigo, unspecified ear: H81.10

## 2017-01-07 NOTE — Assessment & Plan Note (Addendum)
Assessment: Patient complains of dizziness that has been worse over the past 2-3 weeks. She was recently started on trazodone and Wellbutrin by her psychiatrist at [facility] 3 weeks ago. She has also been having right ear fullness for the past 2 weeks. Denies any fevers or recent illnesses. Dizziness will last seconds and sometimes minutes. Associated with movement such as getting out of her [place] Lei and even with walking. She had a + Dicks Hallpike maneuver on the left which resolved w/ epley maneuver.   Plan: Sx consistent w/ BPPV. Given instructions on epley manuver to do at home. RTC if sx worsen.

## 2017-01-07 NOTE — Progress Notes (Signed)
Internal Medicine Clinic Attending  I saw and evaluated the patient.  I personally confirmed the key portions of the history and exam documented by Dr. Hulen Luster and I reviewed pertinent patient test results.  The assessment, diagnosis, and plan were formulated together and I agree with the documentation in the resident's note.  Symptoms consistent with left posterior canal BPPV. We were able to illicit dizziness with a left ear down Dix Hallpike maneuver, and then cured the vertigo with an Epley maneuver.

## 2017-01-07 NOTE — Addendum Note (Signed)
Addended by: Lalla Brothers T on: 01/07/2017 02:55 PM   Modules accepted: Level of Service

## 2017-01-07 NOTE — Progress Notes (Signed)
   CC: Dizziness  HPI:  [name] is a 56 y.o. with past medical history as outlined below presents to clinic for dizziness. Please see problem list for details of patient's chronic medical issues  Past Medical History:  Diagnosis Date  . Allergic rhinitis   . Anemia, iron deficiency   . Anginal pain [facility])    last cardiologist visit 2010 - dx/ed with anxiety instead of cardiac pain  . Arthritis   . Atypical chest pain    Negative cardiolite at [facility] cards 11/07  . CHOLECYSTECTOMY, HX OF    Annotation: 2000 Qualifier: Diagnosis of  By: Eddie Dibbles MD, Bhakti    . Elevated BP   . History of blood transfusion 03/2008   Requried 2u prbc; menometrorrhagia, uterine fibroids.  . Hypertension   . Hypertriglyceridemia    164 - 2/11  . Hypothyroidism   . Insomnia   . Obesity    250 lbs 8/11  . Panic disorder 11/05  . Perirectal abscess 11/14/2011  . Shortness of breath 05/18/2012   w/exertion"    Review of Systems:  Positive for right ear fullness, dizziness, nausea. Denies any vomiting or poor by mouth intake.  Physical Exam:  Vitals:   01/07/17 1018  BP: 129/63  Pulse: 80  Temp: 98.8 F (37.1 C)  TempSrc: Oral  SpO2: 99%  Weight: 287 lb 4.8 oz (130.3 kg)  Height: 5\' 1"  (1.549 m)   Physical Exam  Constitutional: oriented to person, place, and time. appears well-developed and well-nourished. No distress.  HENT:  Head: Normocephalic and atraumatic. Maxillary sinus tenderness Nose: Nose normal.  Ear: Clear tympanic membranes b/l Neurological: Positive dicks hallpike maneuver on the left.   Assessment & Plan:   See Encounters Tab for problem based charting.  Patient discussed with Dr. Evette Doffing

## 2017-01-07 NOTE — Patient Instructions (Signed)

## 2017-01-15 ENCOUNTER — Ambulatory Visit: Payer: Self-pay

## 2017-01-18 ENCOUNTER — Ambulatory Visit: Payer: Self-pay

## 2017-01-22 ENCOUNTER — Ambulatory Visit: Payer: Self-pay

## 2017-02-04 ENCOUNTER — Ambulatory Visit (HOSPITAL_COMMUNITY): Payer: Self-pay

## 2017-02-10 ENCOUNTER — Other Ambulatory Visit: Payer: Self-pay | Admitting: Internal Medicine

## 2017-02-11 ENCOUNTER — Ambulatory Visit (INDEPENDENT_AMBULATORY_CARE_PROVIDER_SITE_OTHER): Payer: Self-pay | Admitting: Internal Medicine

## 2017-02-11 ENCOUNTER — Ambulatory Visit: Payer: Self-pay

## 2017-02-11 VITALS — BP 145/77 | HR 68 | Temp 97.5°F | Wt 285.2 lb

## 2017-02-11 DIAGNOSIS — H8112 Benign paroxysmal vertigo, left ear: Secondary | ICD-10-CM

## 2017-02-11 DIAGNOSIS — I10 Essential (primary) hypertension: Secondary | ICD-10-CM

## 2017-02-11 DIAGNOSIS — R110 Nausea: Secondary | ICD-10-CM

## 2017-02-11 DIAGNOSIS — R42 Dizziness and giddiness: Secondary | ICD-10-CM

## 2017-02-11 DIAGNOSIS — Z79899 Other long term (current) drug therapy: Secondary | ICD-10-CM

## 2017-02-11 MED ORDER — LISINOPRIL 30 MG PO TABS: 30.0000 mg | ORAL_TABLET | Freq: Every day | ORAL | 2 refills | Status: DC

## 2017-02-11 MED ORDER — PROMETHAZINE HCL 25 MG PO TABS: 25.0000 mg | ORAL_TABLET | Freq: Four times a day (QID) | ORAL | 0 refills | Status: DC | PRN

## 2017-02-11 NOTE — Progress Notes (Signed)
Medicine attending: Medical history, presenting problems, physical findings, and medications, reviewed with resident physician Dr Velna Ochs on the day of the patient visit and I concur with his evaluation and management plan.

## 2017-02-11 NOTE — Patient Instructions (Signed)
[name],  It was a pleasure seeing you today. For your vertigo, I have referred you to vestibular rehab. They will call you to schedule an appointment. I have also give you a prescription for phenergan. You may take this for nausea as needed. Your blood pressure was elevated today. I have increased the dose of your lisinopril to 30 mg daily. You may take 1 1/2 tablets of your current prescription until you run out and then start your new prescription. Please return to clinic in 1 month for blood pressure recheck. If you have any questions or concerns, call our clinic at [phone] or after hours call [phone] and ask for the internal medicine resident on call. Thank you!  - Dr. Philipp Ovens

## 2017-02-11 NOTE — Progress Notes (Signed)
   CC: vertigo   HPI:  [name] is a 56 y.o. female with past medical history outlined below here for vertigo follow up. For the details of today's visit, please refer to the assessment and plan.  Past Medical History:  Diagnosis Date  . Allergic rhinitis   . Anemia, iron deficiency   . Anginal pain [facility])    last cardiologist visit 2010 - dx/ed with anxiety instead of cardiac pain  . Arthritis   . Atypical chest pain    Negative cardiolite at [facility] cards 11/07  . CHOLECYSTECTOMY, HX OF    Annotation: 2000 Qualifier: Diagnosis of  By: Eddie Dibbles MD, Bhakti    . Elevated BP   . History of blood transfusion 03/2008   Requried 2u prbc; menometrorrhagia, uterine fibroids.  . Hypertension   . Hypertriglyceridemia    164 - 2/11  . Hypothyroidism   . Insomnia   . Obesity    250 lbs 8/11  . Panic disorder 11/05  . Perirectal abscess 11/14/2011  . Shortness of breath 05/18/2012   w/exertion"    Review of Systems:  All pertinents listed in HPI, otherwise negative  Physical Exam:  Vitals:   02/11/17 0953  BP: (!) 172/88  Pulse: 74  Temp: 97.5 F (36.4 C)  TempSrc: Oral  SpO2: 98%  Weight: 285 lb 3.2 oz (129.4 kg)    Constitutional: NAD, appears comfortable Cardiovascular: RRR, no murmurs, rubs, or gallops.  Pulmonary/Chest: CTAB, no wheezes, rales, or rhonchi.  Abdominal: Soft, non tender, non distended. +BS.  Extremities: Warm and well perfused. Distal pulses intact. No edema.  Neurological: A&Ox3, CN II - XII grossly intact. Leftward beating nystagmus with leftward gaze  Skin: No rashes or erythema  Psychiatric: Normal mood and affect  Assessment & Plan:   See Encounters Tab for problem based charting.  Patient discussed with Dr. Beryle Beams

## 2017-02-11 NOTE — Telephone Encounter (Signed)
[place] is requesting 90 days supply. Thanks

## 2017-02-11 NOTE — Assessment & Plan Note (Signed)
Patient was seen in clinic one month ago with complaint of dizziness felt to be consistent with BPPV. She was treated with the Epley maneuver in clinic and given instructions on how to perform this maneuver at home. Patient reports vertigo initially resolved but has now recurred. She has attempted to do the maneuver at home without relief. She is endorsing episodic dizziness that lasts for approximately 1 minute at a time. Dizziness is worse with movement and improves when she sits still. She is having about 4 episodes of vertigo day associated with severe nausea. She is requesting nausea medication.  -- Vestibular rehab referral  -- PRN phenergan for nausea  -- F/u as needed

## 2017-02-11 NOTE — Assessment & Plan Note (Signed)
Blood pressure is elevated today 172/88, and persistently elevated on recheck 145/77. She reports compliance with her HCTZ 25 mg daily and fosinopril 20 mg daily.  -- Increase lisinopril to 30 mg daily -- continue HCTZ 25 mg daily -- Follow-up 1 month

## 2017-02-12 ENCOUNTER — Ambulatory Visit (HOSPITAL_COMMUNITY)
Admission: RE | Admit: 2017-02-12 | Discharge: 2017-02-12 | Disposition: A | Discharge status: Home / Self Care | Payer: Self-pay | Source: Ambulatory Visit | Attending: Oncology | Admitting: Oncology

## 2017-02-12 ENCOUNTER — Encounter (HOSPITAL_COMMUNITY): Payer: Self-pay

## 2017-02-12 DIAGNOSIS — M545 Low back pain, unspecified: Secondary | ICD-10-CM

## 2017-02-20 ENCOUNTER — Encounter: Payer: Self-pay | Admitting: Internal Medicine

## 2017-02-20 DIAGNOSIS — M545 Low back pain, unspecified: Secondary | ICD-10-CM

## 2017-02-22 NOTE — Addendum Note (Signed)
Addended by: Marcelino Duster on: 02/22/2017 11:18 AM   Modules accepted: Orders

## 2017-02-25 ENCOUNTER — Telehealth: Payer: Self-pay | Admitting: Internal Medicine

## 2017-02-25 NOTE — Telephone Encounter (Signed)
I am replacing order as done by Dr. Beryle Beams and Resident Doctor Jari Favre for MRI of the lumbar spine.  Patient is requesting Open MRI.  Re-ordered b/c the original was not available to modify.  Please let me know if there are any other issues.

## 2017-02-25 NOTE — Telephone Encounter (Signed)
CALLED PT, NO ANSWER, NO VOICEMAIL, NEEDS TO DO CAFA APP

## 2017-03-11 ENCOUNTER — Ambulatory Visit: Payer: Self-pay

## 2017-03-12 ENCOUNTER — Encounter: Payer: Self-pay | Admitting: Internal Medicine

## 2017-03-12 ENCOUNTER — Ambulatory Visit (INDEPENDENT_AMBULATORY_CARE_PROVIDER_SITE_OTHER): Payer: Self-pay | Admitting: Internal Medicine

## 2017-03-12 VITALS — BP 138/56 | HR 74 | Temp 97.8°F | Ht 61.0 in | Wt 283.6 lb

## 2017-03-12 DIAGNOSIS — I10 Essential (primary) hypertension: Secondary | ICD-10-CM

## 2017-03-12 DIAGNOSIS — Z6841 Body Mass Index (BMI) 40.0 and over, adult: Secondary | ICD-10-CM

## 2017-03-12 DIAGNOSIS — Z79899 Other long term (current) drug therapy: Secondary | ICD-10-CM

## 2017-03-12 DIAGNOSIS — E781 Pure hyperglyceridemia: Secondary | ICD-10-CM

## 2017-03-12 NOTE — Progress Notes (Signed)
Internal Medicine Clinic Attending  Case discussed with Dr. Rathoreat the time of the visit. We reviewed the resident's history and exam and pertinent patient test results. I agree with the assessment, diagnosis, and plan of care documented in the resident's note.  

## 2017-03-12 NOTE — Progress Notes (Signed)
   CC: Patient is here for follow-up of hypertension. She is also requesting labs to check her triglyceride level.   HPI:  [name] is a 55 y.o.  female with a past medical history of conditions listed below presenting to the clinic for follow-up of hypertension. Patient is also requesting labs to check her triglyceride level. Please see problem based charting for the status of the patient's current and chronic medical conditions.   Past Medical History:  Diagnosis Date  . Allergic rhinitis   . Anemia, iron deficiency   . Anginal pain [facility])    last cardiologist visit 2010 - dx/ed with anxiety instead of cardiac pain  . Arthritis   . Atypical chest pain    Negative cardiolite at [facility] cards 11/07  . CHOLECYSTECTOMY, HX OF    Annotation: 2000 Qualifier: Diagnosis of  By: Eddie Dibbles MD, Bhakti    . Elevated BP   . History of blood transfusion 03/2008   Requried 2u prbc; menometrorrhagia, uterine fibroids.  . Hypertension   . Hypertriglyceridemia    164 - 2/11  . Hypothyroidism   . Insomnia   . Obesity    250 lbs 8/11  . Panic disorder 11/05  . Perirectal abscess 11/14/2011  . Shortness of breath 05/18/2012   w/exertion"    Review of Systems:  Review of Systems  Constitutional: Negative for chills and fever.  Respiratory: Negative for shortness of breath.   Cardiovascular: Negative for leg swelling.  Gastrointestinal: Negative for abdominal pain, nausea and vomiting.     Physical Exam:  Vitals:   03/12/17 0919  BP: (!) 138/56  Pulse: 74  Temp: 97.8 F (36.6 C)  TempSrc: Oral  SpO2: 99%  Weight: 283 lb 9.6 oz (128.6 kg)  Height: 5\' 1"  (1.549 m)   Physical Exam  Constitutional: She is oriented to person, place, and time. She appears well-developed and well-nourished. No distress.  Morbidly obese  Eyes: Right eye exhibits no discharge. Left eye exhibits no discharge.  Neck: Neck supple. No tracheal deviation present.  Cardiovascular: Normal rate, regular rhythm  and intact distal pulses.   Pulmonary/Chest: Effort normal and breath sounds normal. No respiratory distress. She has no wheezes. She has no rales.  Abdominal: Soft. Bowel sounds are normal. She exhibits no distension. There is no tenderness.  Musculoskeletal: She exhibits no edema.  Neurological: She is alert and oriented to person, place, and time.  Skin: Skin is warm and dry.    Assessment & Plan:   See Encounters Tab for problem based charting.  Patient discussed with Dr. Lynnae January

## 2017-03-12 NOTE — Patient Instructions (Addendum)
[name] it was a pleasure meeting you today.  Continue taking Hydrochlorothiazide 25 mg once daily.  Continue taking Lisinopril 30 mg once daily.

## 2017-03-12 NOTE — Assessment & Plan Note (Addendum)
Assessment Patient is requesting labs to check her triglyceride level. Review of chart shows lipid panel was last checked in October 2013 when triglyceride level was 255, HDL 39, and LDL 110. She is not currently on a statin. No history of coronary artery disease or diabetes. Per cardiology documentation, patient had a normal stress test in 2007 and normal stress echo in 2011.   Plan -Check fasting lipid panel. Patient confirms she has been fasting this morning.  Addendum 03/15/2017 at 11:44 am: Lipid panel showing cholesterol 208, triglycerides 274, HDL 34, and LDL 119. Her 10 year ASCVD risk score is 4.5%. No indication for statin therapy at this time. Lifestyle modification (weight loss) would be beneficial. I tried calling the patient but could not reach her over the phone.   Addendum 03/15/2017 at 5:38 pm: Tried calling the patient again but could not reach her over the phone.  Addendum 03/17/2017 at 4:34 PM: Spoke to the patient over the phone and discussed lab result with her. She agrees with lifestyle modification at this time.

## 2017-03-12 NOTE — Assessment & Plan Note (Signed)
BP Readings from Last 3 Encounters:  03/12/17 (!) 138/56  02/11/17 (!) 145/77  01/07/17 129/63    Lab Results  Component Value Date   NA 137 03/06/2016   K 4.0 03/06/2016   CREATININE 1.21 (H) 03/06/2016    Assessment: Blood pressure control:  below goal Progress toward BP goal:   improved Comments: Patient is currently taking hydrochlorothiazide 25 mg daily and lisinopril 30 mg daily. Dose of lisinopril was increased from 20 mg daily to 30 mg daily during her previous visit. At present, blood pressure seems to be well-controlled with this new dose of lisinopril in addition to the hydrochlorothiazide.  Plan: Medications:  continue current medications Educational resources provided:   Educated patient about healthy eating and exercise. Emphasized the importance of weight loss.

## 2017-03-13 LAB — LIPID PANEL
CHOLESTEROL TOTAL: 208 mg/dL — AB (ref 100–199)
Chol/HDL Ratio: 6.1 ratio — ABNORMAL HIGH (ref 0.0–4.4)
HDL: 34 mg/dL — ABNORMAL LOW (ref >39)
LDL CALC: 119 mg/dL — AB (ref 0–99)
TRIGLYCERIDES: 274 mg/dL — AB (ref 0–149)
VLDL CHOLESTEROL CAL: 55 mg/dL — AB (ref 5–40)

## 2017-03-15 ENCOUNTER — Telehealth: Payer: Self-pay

## 2017-03-15 NOTE — Telephone Encounter (Signed)
Request sent to Dr Marlowe Sax who saw heron 6/1.

## 2017-03-15 NOTE — Telephone Encounter (Signed)
Requesting lab result. Please call back. 

## 2017-03-15 NOTE — Telephone Encounter (Signed)
I have tried calling the patient twice today but am not able to reach her over the phone. Will try again later.

## 2017-03-17 ENCOUNTER — Telehealth: Payer: Self-pay | Admitting: Internal Medicine

## 2017-03-17 NOTE — Telephone Encounter (Signed)
PT STILL WAITING FOR DR. RATHORE TO CALL WITH HER TEST RESULTS

## 2017-03-17 NOTE — Telephone Encounter (Signed)
I spoke to the patient over the phone. Thanks.

## 2017-03-17 NOTE — Telephone Encounter (Signed)
See Dr Elon Jester note.

## 2017-03-26 ENCOUNTER — Ambulatory Visit
Admission: RE | Admit: 2017-03-26 | Discharge: 2017-03-26 | Disposition: A | Discharge status: Home / Self Care | Payer: No Typology Code available for payment source | Source: Ambulatory Visit | Attending: Internal Medicine | Admitting: Internal Medicine

## 2017-03-26 ENCOUNTER — Ambulatory Visit (HOSPITAL_COMMUNITY): Payer: Self-pay

## 2017-03-26 DIAGNOSIS — M545 Low back pain, unspecified: Secondary | ICD-10-CM

## 2017-03-29 ENCOUNTER — Ambulatory Visit: Payer: Self-pay | Attending: Internal Medicine | Admitting: Physical Therapy

## 2017-03-29 DIAGNOSIS — R42 Dizziness and giddiness: Secondary | ICD-10-CM | POA: Insufficient documentation

## 2017-03-30 NOTE — Therapy (Signed)
[place] [address] [place], [place], [address] Phone: [phone]   Fax:  [phone]  Physical Therapy Evaluation  Patient Details  Name: [name] MRN: [id number] Date of Birth: 01/27/1961 Referring Provider: Dr. Aldine Contes  Encounter Date: 03/29/2017      PT End of Session - 03/30/17 2316    Visit Number 1   Number of Visits 1   Authorization Type CAFA   PT Start Time 5277   PT Stop Time 1620   PT Time Calculation (min) 45 min      Past Medical History:  Diagnosis Date  . Allergic rhinitis   . Anemia, iron deficiency   . Anginal pain [facility])    last cardiologist visit 2010 - dx/ed with anxiety instead of cardiac pain  . Arthritis   . Atypical chest pain    Negative cardiolite at [facility] cards 11/07  . CHOLECYSTECTOMY, HX OF    Annotation: 2000 Qualifier: Diagnosis of  By: Eddie Dibbles MD, Bhakti    . Elevated BP   . History of blood transfusion 03/2008   Requried 2u prbc; menometrorrhagia, uterine fibroids.  . Hypertension   . Hypertriglyceridemia    164 - 2/11  . Hypothyroidism   . Insomnia   . Obesity    250 lbs 8/11  . Panic disorder 11/05  . Perirectal abscess 11/14/2011  . Shortness of breath 05/18/2012   w/exertion"    Past Surgical History:  Procedure Laterality Date  . ABDOMINAL HYSTERECTOMY  04/2007   total abdominal hysterectomy ( cervix) with bilateral salpingo-oopherectomy  . CHOLECYSTECTOMY  07/30/1999   Diagnosis of  By: Eddie Dibbles MD, Bhakti    . DILATION AND CURETTAGE OF UTERUS  1987  . INCISION AND DRAINAGE ABSCESS  11/02/2012   Procedure: INCISION AND DRAINAGE ABSCESS;  Surgeon: Odis Hollingshead, MD;  Location: [place];  Service: General;  Laterality: N/A;  I&D anorectal abscess  . INCISION AND DRAINAGE PERIRECTAL ABSCESS  11/14/2011   Procedure: IRRIGATION AND DEBRIDEMENT PERIRECTAL ABSCESS;  Surgeon: Zenovia Jarred, MD;  Location: [place];  Service: General;  Laterality: N/A;  .  NERVE, TENDON AND ARTERY REPAIR Left 06/11/2013   Procedure: EXPLORATION AND REPAIR LEFT FOREARM;  Surgeon: Schuyler Amor, MD;  Location: [place];  Service: Orthopedics;  Laterality: Left;  . SHOULDER ARTHROSCOPY WITH ROTATOR CUFF REPAIR AND SUBACROMIAL DECOMPRESSION Left 03/06/2016   Procedure: LEFT SHOULDER ARTHROSCOPY WITH ROTATOR CUFF REPAIR AND SUBACROMIAL DECOMPRESSION, DISTAL CLAVICLE EXCISION;  Surgeon: Leandrew Koyanagi, MD;  Location: [place];  Service: Orthopedics;  Laterality: Left;    There were no vitals filed for this visit.       Subjective Assessment - 03/30/17 1852    Subjective Pt reports episode started at end of March 2018; pt saw MD and notes state that Epley maneuver was done - pt states she was not dzzy when they did maneuver so this did not help; pt reports she gets dizziness with turning over in bed  and with walking around  Pt states she hears a "whishing sound" in her ears which precedes vertiginous episodes which last 3"-5"   Pertinent History anemia, iron deficiency:  elevated BP: angina   Patient Stated Goals "to not have so much dizziness"            [facility] PT Assessment - 03/30/17 0001      Assessment   Medical Diagnosis BPPV   Referring Provider Dr. Aldine Contes   Onset Date/Surgical  Date --  end of March 2018   Hand Dominance --            Vestibular Assessment - 03/30/17 0001      Vestibular Assessment   General Observation Pt is a 56 yr old female with c/o vertigo that started at the end of March 2018     Symptom Behavior   Type of Dizziness Spinning   Frequency of Dizziness varies  occurs with standing/walking/rolling   Duration of Dizziness 3"-5"   Aggravating Factors Activity in general   Relieving Factors Lying supine     Occulomotor Exam   Occulomotor Alignment Normal   Smooth Pursuits Intact   Saccades Intact     Positional Testing   Dix-Hallpike Dix-Hallpike Right;Dix-Hallpike Left   Sidelying Test Sidelying  Right;Sidelying Left     Dix-Hallpike Right   Dix-Hallpike Right Duration none   Dix-Hallpike Right Symptoms No nystagmus     Dix-Hallpike Left   Dix-Hallpike Left Duration none   Dix-Hallpike Left Symptoms No nystagmus     Sidelying Right   Sidelying Right Duration none   Sidelying Right Symptoms No nystagmus     Sidelying Left   Sidelying Left Duration none   Sidelying Left Symptoms No nystagmus     Positional Sensitivities   Sit to Supine No dizziness   Supine to Left Side No dizziness   Supine to Right Side No dizziness   Supine to Sitting No dizziness        Objective measurements completed on examination: See above findings.                               Plan - 03/30/17 2317    Clinical Impression Statement Pt is a 56 yr old female with c/o dizziness with standing, walking and occasionally when she rolls onto her Lt side at night.  No signs consistent with BPPV noted at today's evaluation - no nystagmus occurred with any positional testing.  Pt has (-) Rt and Lt Dix-Hallpike tests with no c/o vertigo and no nystagmus with any positional testing.     History and Personal Factors relevant to plan of care: approx. 3 months ago onset of vertigo - no signs consistent with BPPV at this time   Clinical Presentation Stable   Clinical Presentation due to: dizziness   Clinical Decision Making Low   PT Frequency One time visit   PT Treatment/Interventions Patient/family education   PT Next Visit Plan N/A    Recommended Other Services pt wishes to follow up with MD for further diagnostic work up to determine etiology of vertigo as no signs consistent with BPPV noted at today's eval   Consulted and Agree with Plan of Care Patient      Patient will benefit from skilled therapeutic intervention in order to improve the following deficits and impairments:     Visit Diagnosis: Dizziness and giddiness - Plan: PT plan of care cert/re-cert     Problem  List Patient Active Problem List   Diagnosis Date Noted  . BPPV (benign paroxysmal positional vertigo) 01/07/2017  . Urinary incontinence without sensory awareness 12/17/2016  . Low back pain potentially associated with radiculopathy 12/17/2016  . Injury of left rotator cuff 12/02/2016  . Acute myofascial strain of lumbosacral region 08/20/2016  . Venous (peripheral) insufficiency 11/08/2015  . Chronic low back pain 11/08/2015  . HTN (hypertension) 02/09/2014  . Preventative health care 02/09/2014  . Nontoxic  thyroid nodule 01/08/2013  . Metabolic syndrome 28/83/3744  . S/P TAH-BSO (total abdominal hysterectomy and bilateral salpingo-oophorectomy), Hx of 09/22/2012  . Pectus excavatum 09/22/2012  . Hepatic steatosis 09/22/2012  . Chronic left maxillary sinus disease 09/22/2012  . Prediabetes 09/22/2012  . Family history of leukemia 09/22/2012  . Proteinuria 07/17/2012  . INSOMNIA UNSPECIFIED 05/01/2010  . Hypothyroidism 04/05/2008  . ANEMIA, IRON DEFICIENCY, history of 04/05/2008  . Hypertriglyceridemia 08/31/2006  . OBESITY 08/31/2006  . PANIC DISORDER 08/31/2006  . CHOLECYSTECTOMY, HX OF 08/31/2006    Alda Lea, PT 03/30/2017, 11:32 PM  [place] [address] [place], [place], [address] Phone: [phone]   Fax:  [phone]  Name: [name] MRN: [id number] Date of Birth: [birth year]

## 2017-04-01 ENCOUNTER — Encounter: Payer: Self-pay | Admitting: Primary Care

## 2017-04-01 ENCOUNTER — Ambulatory Visit (INDEPENDENT_AMBULATORY_CARE_PROVIDER_SITE_OTHER): Payer: No Typology Code available for payment source | Admitting: Primary Care

## 2017-04-01 ENCOUNTER — Other Ambulatory Visit: Payer: Self-pay | Admitting: Primary Care

## 2017-04-01 VITALS — BP 146/82 | HR 75 | Temp 98.0°F | Ht 60.5 in | Wt 275.0 lb

## 2017-04-01 DIAGNOSIS — I10 Essential (primary) hypertension: Secondary | ICD-10-CM

## 2017-04-01 DIAGNOSIS — Z862 Personal history of diseases of the blood and blood-forming organs and certain disorders involving the immune mechanism: Secondary | ICD-10-CM

## 2017-04-01 DIAGNOSIS — E781 Pure hyperglyceridemia: Secondary | ICD-10-CM

## 2017-04-01 DIAGNOSIS — E039 Hypothyroidism, unspecified: Secondary | ICD-10-CM

## 2017-04-01 DIAGNOSIS — R739 Hyperglycemia, unspecified: Secondary | ICD-10-CM

## 2017-04-01 DIAGNOSIS — G8929 Other chronic pain: Secondary | ICD-10-CM

## 2017-04-01 DIAGNOSIS — F410 Panic disorder [episodic paroxysmal anxiety] without agoraphobia: Secondary | ICD-10-CM

## 2017-04-01 DIAGNOSIS — D509 Iron deficiency anemia, unspecified: Secondary | ICD-10-CM

## 2017-04-01 DIAGNOSIS — I872 Venous insufficiency (chronic) (peripheral): Secondary | ICD-10-CM

## 2017-04-01 DIAGNOSIS — M5442 Lumbago with sciatica, left side: Secondary | ICD-10-CM

## 2017-04-01 LAB — COMPREHENSIVE METABOLIC PANEL
ALK PHOS: 90 U/L (ref 39–117)
ALT: 20 U/L (ref 0–35)
AST: 20 U/L (ref 0–37)
Albumin: 4.3 g/dL (ref 3.5–5.2)
BUN: 17 mg/dL (ref 6–23)
CHLORIDE: 103 meq/L (ref 96–112)
CO2: 28 mEq/L (ref 19–32)
Calcium: 10.1 mg/dL (ref 8.4–10.5)
Creatinine, Ser: 1.25 mg/dL — ABNORMAL HIGH (ref 0.40–1.20)
GFR: 47.55 mL/min — AB (ref >60.00)
GLUCOSE: 114 mg/dL — AB (ref 70–99)
POTASSIUM: 4.2 meq/L (ref 3.5–5.1)
SODIUM: 139 meq/L (ref 135–145)
Total Bilirubin: 0.6 mg/dL (ref 0.2–1.2)
Total Protein: 7.9 g/dL (ref 6.0–8.3)

## 2017-04-01 LAB — T4, FREE: Free T4: 0.73 ng/dL (ref 0.60–1.60)

## 2017-04-01 LAB — CBC
HCT: 40.7 % (ref 36.0–46.0)
HEMOGLOBIN: 13.7 g/dL (ref 12.0–15.0)
MCHC: 33.8 g/dL (ref 30.0–36.0)
MCV: 90 fl (ref 78.0–100.0)
Platelets: 259 10*3/uL (ref 150.0–400.0)
RBC: 4.52 Mil/uL (ref 3.87–5.11)
RDW: 13.8 % (ref 11.5–15.5)
WBC: 7.5 10*3/uL (ref 4.0–10.5)

## 2017-04-01 LAB — TSH: TSH: 5.07 u[IU]/mL — ABNORMAL HIGH (ref 0.35–4.50)

## 2017-04-01 LAB — HEMOGLOBIN A1C: Hgb A1c MFr Bld: 5.7 % (ref 4.6–6.5)

## 2017-04-01 MED ORDER — LEVOTHYROXINE SODIUM 88 MCG PO TABS: ORAL_TABLET | ORAL | 0 refills | Status: DC

## 2017-04-01 MED ORDER — AMLODIPINE BESYLATE 10 MG PO TABS: ORAL_TABLET | ORAL | 0 refills | Status: DC

## 2017-04-01 NOTE — Assessment & Plan Note (Signed)
Suspect this has returned given ice and coffee ground cravings. CBC pending.

## 2017-04-01 NOTE — Assessment & Plan Note (Signed)
Discussed to try compression socks, can get these at [facility]. Continue to elevate extremities. Check renal function today. Work on weight loss.

## 2017-04-01 NOTE — Assessment & Plan Note (Signed)
Above goal today, also with several other documented visits. Home readings above goal. Add Amlodipine 10 mg tablets, will consider switching to metoprolol if unaffordable and if it causes increased edema. Continue HCTZ and lisinopril. BMP pending.  Follow up in 3 weeks with BP logs.

## 2017-04-01 NOTE — Assessment & Plan Note (Signed)
Needs re-evaluation of TSH, pending today. Will adjust levothyroxine accordingly.

## 2017-04-01 NOTE — Assessment & Plan Note (Signed)
Following with psychiatry through [facility]. Continue current regimen. 

## 2017-04-01 NOTE — Patient Instructions (Addendum)
Start amlodipine 10 mg tablets for high blood pressure. Continue HCTZ 25 mg and lisinopril 30 mg for blood pressure.  Continue to monitor your blood pressure at home.  Complete lab work prior to leaving today. I will notify you of your results once received.   Continue to work on weight loss through healthy diet and exercise.  You will be contacted regarding your referral to physical therapy.  Please let us know if you have not heard back within one week.   Follow up in 3 weeks for re-evaluation of your blood pressure.  It was a pleasure to meet you today! Please don't hesitate to call me with any questions. Welcome to [facility]!   DASH Eating Plan DASH stands for "Dietary Approaches to Stop Hypertension." The DASH eating plan is a healthy eating plan that has been shown to reduce high blood pressure (hypertension). It may also reduce your risk for type 2 diabetes, heart disease, and stroke. The DASH eating plan may also help with weight loss. What are tips for following this plan? General guidelines  Avoid eating more than 2,300 mg (milligrams) of salt (sodium) a day. If you have hypertension, you may need to reduce your sodium intake to 1,500 mg a day.  Limit alcohol intake to no more than 1 drink a day for nonpregnant women and 2 drinks a day for men. One drink equals 12 oz of beer, 5 oz of wine, or 1 oz of hard liquor.  Work with your health care provider to maintain a healthy body weight or to lose weight. Ask what an ideal weight is for you.  Get at least 30 minutes of exercise that causes your heart to beat faster (aerobic exercise) most days of the week. Activities may include walking, swimming, or biking.  Work with your health care provider or diet and nutrition specialist (dietitian) to adjust your eating plan to your individual calorie needs. Reading food labels  Check food labels for the amount of sodium per serving. Choose foods with less than 5 percent of the Daily  Value of sodium. Generally, foods with less than 300 mg of sodium per serving fit into this eating plan.  To find whole grains, look for the word "whole" as the first word in the ingredient list. Shopping  Buy products labeled as "low-sodium" or "no salt added."  Buy fresh foods. Avoid canned foods and premade or frozen meals. Cooking  Avoid adding salt when cooking. Use salt-free seasonings or herbs instead of table salt or sea salt. Check with your health care provider or pharmacist before using salt substitutes.  Do not fry foods. Cook foods using healthy methods such as baking, boiling, grilling, and broiling instead.  Cook with heart-healthy oils, such as olive, canola, soybean, or sunflower oil. Meal planning   Eat a balanced diet that includes: ? 5 or more servings of fruits and vegetables each day. At each meal, try to fill half of your plate with fruits and vegetables. ? Up to 6-8 servings of whole grains each day. ? Less than 6 oz of lean meat, poultry, or fish each day. A 3-oz serving of meat is about the same size as a deck of cards. One egg equals 1 oz. ? 2 servings of low-fat dairy each day. ? A serving of nuts, seeds, or beans 5 times each week. ? Heart-healthy fats. Healthy fats called Omega-3 fatty acids are found in foods such as flaxseeds and coldwater fish, like sardines, salmon, and mackerel.  Limit  how much you eat of the following: ? Canned or prepackaged foods. ? Food that is high in trans fat, such as fried foods. ? Food that is high in saturated fat, such as fatty meat. ? Sweets, desserts, sugary drinks, and other foods with added sugar. ? Full-fat dairy products.  Do not salt foods before eating.  Try to eat at least 2 vegetarian meals each week.  Eat more home-cooked food and less restaurant, buffet, and fast food.  When eating at a restaurant, ask that your food be prepared with less salt or no salt, if possible. What foods are recommended? The  items listed may not be a complete list. Talk with your dietitian about what dietary choices are best for you. Grains Whole-grain or whole-wheat bread. Whole-grain or whole-wheat pasta. Brown rice. [place]ena Morrow. Bulgur. Whole-grain and low-sodium cereals. Pita bread. Low-fat, low-sodium crackers. Whole-wheat flour tortillas. Vegetables Fresh or frozen vegetables (raw, steamed, roasted, or grilled). Low-sodium or reduced-sodium tomato and vegetable juice. Low-sodium or reduced-sodium tomato sauce and tomato paste. Low-sodium or reduced-sodium canned vegetables. Fruits All fresh, dried, or frozen fruit. Canned fruit in natural juice (without added sugar). Meat and other protein foods Skinless chicken or [place]. Ground chicken or [place]. Pork with fat trimmed off. Fish and seafood. Egg whites. Dried beans, peas, or lentils. Unsalted nuts, nut butters, and seeds. Unsalted canned beans. Lean cuts of beef with fat trimmed off. Low-sodium, lean deli meat. Dairy Low-fat (1%) or fat-free (skim) milk. Fat-free, low-fat, or reduced-fat cheeses. Nonfat, low-sodium ricotta or cottage cheese. Low-fat or nonfat yogurt. Low-fat, low-sodium cheese. Fats and oils Soft margarine without trans fats. Vegetable oil. Low-fat, reduced-fat, or light mayonnaise and salad dressings (reduced-sodium). Canola, safflower, olive, soybean, and sunflower oils. Avocado. Seasoning and other foods Herbs. Spices. Seasoning mixes without salt. Unsalted popcorn and pretzels. Fat-free sweets. What foods are not recommended? The items listed may not be a complete list. Talk with your dietitian about what dietary choices are best for you. Grains Baked goods made with fat, such as croissants, muffins, or some breads. Dry pasta or rice meal packs. Vegetables Creamed or fried vegetables. Vegetables in a cheese sauce. Regular canned vegetables (not low-sodium or reduced-sodium). Regular canned tomato sauce and paste (not low-sodium or  reduced-sodium). Regular tomato and vegetable juice (not low-sodium or reduced-sodium). Angie Fava. Olives. Fruits Canned fruit in a light or heavy syrup. Fried fruit. Fruit in cream or butter sauce. Meat and other protein foods Fatty cuts of meat. Ribs. Fried meat. Berniece Salines. Sausage. Bologna and other processed lunch meats. Salami. Fatback. Hotdogs. Bratwurst. Salted nuts and seeds. Canned beans with added salt. Canned or smoked fish. Whole eggs or egg yolks. Chicken or [place] with skin. Dairy Whole or 2% milk, cream, and half-and-half. Whole or full-fat cream cheese. Whole-fat or sweetened yogurt. Full-fat cheese. Nondairy creamers. Whipped toppings. Processed cheese and cheese spreads. Fats and oils Butter. Stick margarine. Lard. Shortening. Ghee. Bacon fat. Tropical oils, such as coconut, palm kernel, or palm oil. Seasoning and other foods Salted popcorn and pretzels. Onion salt, garlic salt, seasoned salt, table salt, and sea salt. Worcestershire sauce. Tartar sauce. Barbecue sauce. Teriyaki sauce. Soy sauce, including reduced-sodium. Steak sauce. Canned and packaged gravies. Fish sauce. Oyster sauce. Cocktail sauce. Horseradish that you find on the shelf. Ketchup. Mustard. Meat flavorings and tenderizers. Bouillon cubes. Hot sauce and Tabasco sauce. Premade or packaged marinades. Premade or packaged taco seasonings. Relishes. Regular salad dressings. Where to find more information:  National Heart, Lung, and Blood Institute:  https://wilson-eaton.com/  American Heart Association: www.heart.org Summary  The DASH eating plan is a healthy eating plan that has been shown to reduce high blood pressure (hypertension). It may also reduce your risk for type 2 diabetes, heart disease, and stroke.  With the DASH eating plan, you should limit salt (sodium) intake to 2,300 mg a day. If you have hypertension, you may need to reduce your sodium intake to 1,500 mg a day.  When on the DASH eating plan, aim to eat more  fresh fruits and vegetables, whole grains, lean proteins, low-fat dairy, and heart-healthy fats.  Work with your health care provider or diet and nutrition specialist (dietitian) to adjust your eating plan to your individual calorie needs. This information is not intended to replace advice given to you by your health care provider. Make sure you discuss any questions you have with your health care provider. Document Released: 09/17/2011 Document Revised: 09/21/2016 Document Reviewed: 09/21/2016 Elsevier Interactive Patient Education  2017 [employer].

## 2017-04-01 NOTE — Progress Notes (Signed)
Subjective:    Patient ID: [name], female    DOB: 1961-03-18, 56 y.o.   MRN: [id number]  HPI  [name] is a 56 year old female who presents today to establish care and discuss the problems mentioned below. Will obtain old records.  1) Anxiety and Depression: Currently managed on Wellbutrin XL 300 mg, paroxetine 20 mg, trazodone 100 mg. Overall doing well and is following with [name] for psychiatry and psychology. She denies SI/HI.  2) Essential Hypertension: Currently managed on HCTZ 25 mg, lisinopril 30 mg. Her BP in the office today is 146/82. She's checking her BP at home and is getting readings 140-150's/80's90's. She denies chest pain, shortness of breath. She does experience lower extremity edema and cannot tolerate compression hose. She does elevate her extremities at night.  BP Readings from Last 3 Encounters:  04/01/17 (!) 146/82  03/12/17 (!) 138/56  02/11/17 (!) 145/77     3) Hypothyroidism: Currently managed on levothyroxine 75 mcg. Her last TSH was 10.1 in March 2018, no recent recheck. She continues to feel fatigued. She is compliant to her levothyroxine.  4) Hyperlipidemia: Not currently managed on medication. Last lipid panel in early June 2018 with TC of 208, LDL of 119, Trigs of 274. She was fasting during her lab draw. She has lost 8 pounds since her last visit with her prior PCP in early June through improvements in her diet.  Wt Readings from Last 3 Encounters:  04/01/17 275 lb (124.7 kg)  03/12/17 283 lb 9.6 oz (128.6 kg)  02/11/17 285 lb 3.2 oz (129.4 kg)     5) Iron Deficiency Anemia: history of years ago, previously losing blood from uterine fibroids, hysterectomy later that year. She's been craving and chewing coffee grounds and ice for the past several months.   6) Chronic Back Pain: Located to bilateral lower back for several months. MRI completed last week with L4-L5 and L5-S1 foraminal narrowing. She denies numbness/tingling to her  extremities, does have pain to left lower extremity. She's taking gabapentin 300 mg TID. She's not taking her cyclobenzaprine.    Review of Systems  Constitutional: Positive for fatigue.  Eyes: Negative for visual disturbance.  Respiratory: Negative for shortness of breath.   Cardiovascular: Positive for leg swelling. Negative for chest pain.  Endocrine: Negative for cold intolerance.  Genitourinary:       Hysterectomy   Musculoskeletal: Positive for back pain.  Skin: Negative for color change.  Neurological: Negative for headaches.  Psychiatric/Behavioral:       Feels well managed per psychiatry       Past Medical History:  Diagnosis Date  . Allergic rhinitis   . Anemia, iron deficiency   . Anginal pain [facility])    last cardiologist visit 2010 - dx/ed with anxiety instead of cardiac pain  . Arthritis   . Atypical chest pain    Negative cardiolite at [facility] cards 11/07  . CHOLECYSTECTOMY, HX OF    Annotation: 2000 Qualifier: Diagnosis of  By: Eddie Dibbles MD, Bhakti    . Elevated BP   . History of blood transfusion 03/2008   Requried 2u prbc; menometrorrhagia, uterine fibroids.  . Hypertension   . Hypertriglyceridemia    164 - 2/11  . Hypothyroidism   . Insomnia   . Obesity    250 lbs 8/11  . Panic disorder 11/05  . Perirectal abscess 11/14/2011  . Shortness of breath 05/18/2012   w/exertion"     Social History   Social History  .  Marital status: Married    Spouse name: N/A  . Number of children: N/A  . Years of education: N/A   Occupational History  . Not on file.   Social History Main Topics  . Smoking status: Never Smoker  . Smokeless tobacco: Never Used  . Alcohol use No  . Drug use: No  . Sexual activity: Not Currently   Other Topics Concern  . Not on file   Social History Narrative   Currently unemployed. Starting college in Aug for accounting.  Married, has 2 children lives with her youngest son.      Past Surgical History:  Procedure Laterality Date  .  ABDOMINAL HYSTERECTOMY  04/2007   total abdominal hysterectomy ( cervix) with bilateral salpingo-oopherectomy  . CHOLECYSTECTOMY  07/30/1999   Diagnosis of  By: Eddie Dibbles MD, Bhakti    . DILATION AND CURETTAGE OF UTERUS  1987  . INCISION AND DRAINAGE ABSCESS  11/02/2012   Procedure: INCISION AND DRAINAGE ABSCESS;  Surgeon: Odis Hollingshead, MD;  Location: [place];  Service: General;  Laterality: N/A;  I&D anorectal abscess  . INCISION AND DRAINAGE PERIRECTAL ABSCESS  11/14/2011   Procedure: IRRIGATION AND DEBRIDEMENT PERIRECTAL ABSCESS;  Surgeon: Zenovia Jarred, MD;  Location: [place];  Service: General;  Laterality: N/A;  . NERVE, TENDON AND ARTERY REPAIR Left 06/11/2013   Procedure: EXPLORATION AND REPAIR LEFT FOREARM;  Surgeon: Schuyler Amor, MD;  Location: [place];  Service: Orthopedics;  Laterality: Left;  . SHOULDER ARTHROSCOPY WITH ROTATOR CUFF REPAIR AND SUBACROMIAL DECOMPRESSION Left 03/06/2016   Procedure: LEFT SHOULDER ARTHROSCOPY WITH ROTATOR CUFF REPAIR AND SUBACROMIAL DECOMPRESSION, DISTAL CLAVICLE EXCISION;  Surgeon: Leandrew Koyanagi, MD;  Location: [place];  Service: Orthopedics;  Laterality: Left;    Family History  Problem Relation Age of Onset  . Heart attack Mother        at the age of 64's  . Lung cancer Mother        metestatic, + smoker  . Aneurysm Mother   . Hyperlipidemia Mother   . Hypertension Mother   . Hypertension Father   . Heart attack Maternal Grandmother        at the age of 65's    Allergies  Allergen Reactions  . Doxycycline Nausea And Vomiting  . Dilaudid [Hydromorphone Hcl] Other (See Comments)    Panic attack  . Sulfur Rash    Itching with rash    Current Outpatient Prescriptions on File Prior to Visit  Medication Sig Dispense Refill  . acetaminophen (TYLENOL) 500 MG tablet Take 1,000 mg by mouth every 8 (eight) hours as needed for mild pain.    . cyclobenzaprine (FLEXERIL) 5 MG tablet Take 1 tablet (5 mg total) by mouth 2 (two) times daily as needed  for muscle spasms. 30 tablet 0  . Elastic Bandages & Supports (MEDICAL COMPRESSION STOCKINGS) MISC 1 each by Does not apply route as directed. 1 each 0  . gabapentin (NEURONTIN) 300 MG capsule Take 1 capsule (300 mg total) by mouth 3 (three) times daily. 90 capsule 2  . hydrochlorothiazide (HYDRODIURIL) 25 MG tablet Take 1 tablet (25 mg total) by mouth daily. 30 tablet 11  . levothyroxine (SYNTHROID) 75 MCG tablet Take 1 tablet (75 mcg total) by mouth daily. 30 tablet 2  . lisinopril (PRINIVIL,ZESTRIL) 30 MG tablet Take 1 tablet (30 mg total) by mouth daily. 90 tablet 2  . PARoxetine (PAXIL) 20 MG tablet Take 1.5 tablets (30 mg total) by mouth daily.  135 tablet 2   No current facility-administered medications on file prior to visit.     BP (!) 146/82   Pulse 75   Temp 98 F (36.7 C) (Oral)   Ht 5' 0.5" (1.537 m)   Wt 275 lb (124.7 kg)   LMP 06/17/2007   SpO2 98%   BMI 52.82 kg/m    Objective:   Physical Exam  Constitutional: She is oriented to person, place, and time. She appears well-nourished.  Neck: Neck supple.  Cardiovascular: Normal rate and regular rhythm.   Pulmonary/Chest: Effort normal and breath sounds normal.  Musculoskeletal: Normal range of motion.  Neurological: She is alert and oriented to person, place, and time.  Skin: Skin is warm and dry.  Psychiatric: She has a normal mood and affect.          Assessment & Plan:

## 2017-04-01 NOTE — Assessment & Plan Note (Signed)
Discussed the importance of a healthy diet and regular exercise in order for weight loss, and to reduce the risk of other medical problems.  

## 2017-04-01 NOTE — Assessment & Plan Note (Signed)
MRI with foraminal narrowing to L4-S1, likely contributing to pain. Discussed treatment options including steroid injections vs PT. She opts for PT, referral placed. Also discussed importance of weight loss.

## 2017-04-01 NOTE — Assessment & Plan Note (Signed)
Recent lipid panel with ASCVD risk score of 4.5%. Will have her work on weight loss through healthy diet and exercise. Repeat lipids in 4-5 months.

## 2017-04-04 ENCOUNTER — Encounter: Payer: Self-pay | Admitting: Primary Care

## 2017-04-07 ENCOUNTER — Encounter: Payer: Self-pay | Admitting: Internal Medicine

## 2017-04-15 ENCOUNTER — Encounter: Payer: Self-pay | Admitting: Rehabilitative and Restorative Service Providers"

## 2017-04-15 ENCOUNTER — Ambulatory Visit
Payer: No Typology Code available for payment source | Attending: Internal Medicine | Admitting: Rehabilitative and Restorative Service Providers"

## 2017-04-15 DIAGNOSIS — M6281 Muscle weakness (generalized): Secondary | ICD-10-CM | POA: Insufficient documentation

## 2017-04-15 DIAGNOSIS — M5442 Lumbago with sciatica, left side: Secondary | ICD-10-CM | POA: Insufficient documentation

## 2017-04-15 DIAGNOSIS — G8929 Other chronic pain: Secondary | ICD-10-CM

## 2017-04-15 NOTE — Patient Instructions (Signed)
Reviewed Xray/MRI results with pt

## 2017-04-15 NOTE — Therapy (Addendum)
[place], [place], [address] Phone: [phone]   Fax:  [phone]  Physical Therapy Evaluation and Discharge  Patient Details  Name: [name] MRN: [id number] Date of Birth: January 11, 1961 Referring Provider: Alma Friendly  Encounter Date: 04/15/2017      PT End of Session - 04/15/17 1216    Visit Number 1   Number of Visits 16   Date for PT Re-Evaluation 06/10/17   Authorization Type CAFA   PT Start Time 1115   PT Stop Time 1152   PT Time Calculation (min) 37 min   Activity Tolerance Patient tolerated treatment well;No increased pain;Other (comment)  pt reports feeling looser after HEP perfomred in clinic   Behavior During Therapy [facility] for tasks assessed/performed      Past Medical History:  Diagnosis Date  . Allergic rhinitis   . Anemia, iron deficiency   . Anginal pain [facility])    last cardiologist visit 2010 - dx/ed with anxiety instead of cardiac pain  . Arthritis   . Atypical chest pain    Negative cardiolite at [facility] cards 11/07  . CHOLECYSTECTOMY, HX OF    Annotation: 2000 Qualifier: Diagnosis of  By: Eddie Dibbles MD, Bhakti    . Elevated BP   . History of blood transfusion 03/2008   Requried 2u prbc; menometrorrhagia, uterine fibroids.  . Hypertension   . Hypertriglyceridemia    164 - 2/11  . Hypothyroidism   . Insomnia   . Obesity    250 lbs 8/11  . Panic disorder 11/05  . Perirectal abscess 11/14/2011  . Shortness of breath 05/18/2012   w/exertion"    Past Surgical History:  Procedure Laterality Date  . ABDOMINAL HYSTERECTOMY  04/2007   total abdominal hysterectomy ( cervix) with bilateral salpingo-oopherectomy  . CHOLECYSTECTOMY  07/30/1999   Diagnosis of  By: Eddie Dibbles MD, Bhakti    . DILATION AND CURETTAGE OF UTERUS  1987  . INCISION AND DRAINAGE ABSCESS  11/02/2012   Procedure: INCISION AND DRAINAGE ABSCESS;  Surgeon: Odis Hollingshead, MD;  Location: [place];  Service: General;   Laterality: N/A;  I&D anorectal abscess  . INCISION AND DRAINAGE PERIRECTAL ABSCESS  11/14/2011   Procedure: IRRIGATION AND DEBRIDEMENT PERIRECTAL ABSCESS;  Surgeon: Zenovia Jarred, MD;  Location: [place];  Service: General;  Laterality: N/A;  . NERVE, TENDON AND ARTERY REPAIR Left 06/11/2013   Procedure: EXPLORATION AND REPAIR LEFT FOREARM;  Surgeon: Schuyler Amor, MD;  Location: [place];  Service: Orthopedics;  Laterality: Left;  . SHOULDER ARTHROSCOPY WITH ROTATOR CUFF REPAIR AND SUBACROMIAL DECOMPRESSION Left 03/06/2016   Procedure: LEFT SHOULDER ARTHROSCOPY WITH ROTATOR CUFF REPAIR AND SUBACROMIAL DECOMPRESSION, DISTAL CLAVICLE EXCISION;  Surgeon: Leandrew Koyanagi, MD;  Location: [place];  Service: Orthopedics;  Laterality: Left;    There were no vitals filed for this visit.       Subjective Assessment - 04/15/17 1117    Subjective Pt reports LBP and L sided sciatica x 90 days insidiously. Standing and walking make pain worse. Pt demonstrates obesity and aggravating movements all directions with no direction preference at evaluation. Pt is tender at musculature at L4-5 levels L extending into Piriformis and down L LE to foot.. Prefers to sleep prone   Pertinent History anemia, iron deficiency:  elevated BP: angina; recent dx stage 3A kidney disease, L old RCR   Limitations Sitting;Standing;Walking;House hold activities;Lifting   How long can you sit comfortably? 1 hour   How long  can you stand comfortably? 30 min   How long can you walk comfortably? 1 min   Patient Stated Goals to get stronger   Currently in Pain? Yes   Pain Score 7    Pain Location Back   Pain Orientation Left   Pain Descriptors / Indicators Burning;Aching;Sore   Pain Type Chronic pain   Pain Radiating Towards down to L foot   Pain Onset More than a month ago   Pain Frequency Constant   Aggravating Factors  standing, walking   Pain Relieving Factors massage, laying prone   Effect of Pain on Daily Activities extreme:  unable to perform activities without pain   Multiple Pain Sites No            [facility] PT Assessment - 04/15/17 0001      Assessment   Medical Diagnosis LBP with L sciatica   Referring Provider Alma Friendly   Onset Date/Surgical Date 12/10/16   Hand Dominance Left   Next MD Visit April 23, 2017   Prior Therapy none for LBP     Precautions   Precautions None     Restrictions   Weight Bearing Restrictions No     Balance Screen   Has the patient fallen in the past 6 months Yes   How many times? 3   Has the patient had a decrease in activity level because of a fear of falling?  Yes   Is the patient reluctant to leave their home because of a fear of falling?  Yes     [place] residence     Prior Function   Level of Independence Independent with basic ADLs     Cognition   Overall Cognitive Status Within Functional Limits for tasks assessed     Sensation   Light Touch Appears Intact     Coordination   Gross Motor Movements are Fluid and Coordinated Yes     Functional Tests   Functional tests Other     Squat   Comments able to perform with pain     Other:   Other/ Comments repeated flex/ext tests are inconsistent with results of pt preference     Posture/Postural Control   Posture Comments overweight, slouched sitting     ROM / Strength   AROM / PROM / Strength AROM;Strength     AROM   Overall AROM Comments Lumbar AROM limited flex 50%, ext 75%, lateral flex 50%     Strength   Overall Strength Comments R LE strength 5/5, L 4/5 except DF 3+/5     Flexibility   Soft Tissue Assessment /Muscle Length yes   Hamstrings SLR + for Hamstring tightness   Piriformis L Piriformis tight     Palpation   Spinal mobility L3-5 with hypomobility   Palpation comment L Piriformis radiates down, superior/posterior iliac crest uncomfortable, L1 spinal mob begins L LE radiation to ankle,      Special Tests    Special Tests Lumbar    Lumbar Tests Slump Test;Straight Leg Raise     Slump test   Findings Positive   Comment with DF of foot L     Straight Leg Raise   Findings Positive   Comment with getting into SLR position L            Objective measurements completed on examination: See above findings.                  PT Education -  04/15/17 1156    Education provided Yes   Education Details HEP issued: Knee to chest 2x30 sec with ankle pump, lower trunk rotation 2x30 sec, supine hamstring stretch with ankle pump 2x30 sec all 2x/day with towel used as needed   Person(s) Educated Patient   Methods Explanation;Demonstration;Handout;Verbal cues   Comprehension Verbalized understanding;Returned demonstration          PT Short Term Goals - 04/15/17 1206      PT SHORT TERM GOAL #1   Title Pt will be I with initial HEP for pain  management   Baseline issued at eval   Time 3   Period Weeks   Status New     PT SHORT TERM GOAL #2   Title Pt will demo improved lumbar flexion to 75% present to assist with household duties   Baseline 50%   Time 4   Period Weeks   Status New     PT SHORT TERM GOAL #3   Title Pt will demo improved L LE strength to >/= 4+/5 to assist with decreased risk of falls   Baseline 4/5 all L except 3+/5 DF   Time 4   Period Weeks   Status New     PT SHORT TERM GOAL #4   Title Pt will demo improved slump test to assist with ADLs to 3 joints moving without symptoms   Baseline symptoms with DF of foot   Time 4   Period Weeks   Status New           PT Long Term Goals - 04/15/17 1210      PT LONG TERM GOAL #1   Title Pt will be I with advanced HEP for pain management   Baseline n/a   Time 8   Period Weeks   Status New     PT LONG TERM GOAL #2   Title Pt will report at least 60% reduction in L LE radicular sxs with activities   Baseline constant   Time 8   Period Weeks   Status New     PT LONG TERM GOAL #3   Title Pt will be able to walk to 8-10 min  to shop with </= 4/10 LBP/L LE radicular pain   Baseline 8-10/10   Time 8   Period Weeks   Status New     PT LONG TERM GOAL #4   Title Pt will report at least 50% reduction in falls as a result of pain/balance   Baseline multiple falls due to LE weakness   Time 8   Period Weeks   Status New                Plan - 04/15/17 1157    Clinical Impression Statement Pt presents to PT with complaints of LBP with bil LE radiation L > R. pt reports multiple falls due to LEs giving out. Will need to assess Berg on subsequent visit once pain has been addressed and decreased. Pt with pain and radiation beginning L4/5 and extending bil down LEs L > R with L traveling to foot. SLR/Slump test + for L LE radiation with minimal joints moving. Pt with L LE weakness per MMT. Pt would benefit from further PT for neural glides, lumbar/hip flexibility bil L > R, L LE weakness, addressing of balance as needed due to falls, and manual therapy and modalities for flexibility and pain management.    History and Personal Factors relevant to plan of care: Xray 12/17/16  demos L3-4 and 4-5 DDD/spurring; MRI 03/26/17 demos L4-5 and L5-S1 formainal narrowsing   Clinical Presentation Evolving   Clinical Presentation due to: pt recent diagnosis of stage 3A kidney disease, LBP with radiation, recent falls   Clinical Decision Making Low   Rehab Potential Good   Clinical Impairments Affecting Rehab Potential pain, difficulty with vocalizing pain   PT Frequency 2x / week   PT Duration 8 weeks   PT Treatment/Interventions ADLs/Self Care Home Management;Electrical Stimulation;Iontophoresis 40m/ml Dexamethasone;Functional mobility training;Gait training;DME Instruction;Ultrasound;Traction;Moist Heat;Therapeutic activities;Therapeutic exercise;Balance training;Neuromuscular re-education;Patient/family education;Manual techniques;Dry needling;Taping   PT Next Visit Plan review HEP; neural glides L LE, continue to determine  preference of motion; Berg, Traction?   PT Home Exercise Plan see pt education section   Consulted and Agree with Plan of Care Patient      Patient will benefit from skilled therapeutic intervention in order to improve the following deficits and impairments:  Abnormal gait, Decreased activity tolerance, Decreased balance, Decreased mobility, Decreased range of motion, Decreased strength, Difficulty walking, Impaired flexibility, Postural dysfunction, Improper body mechanics, Obesity, Pain  Visit Diagnosis: Muscle weakness (generalized) - Plan: PT plan of care cert/re-cert  Chronic bilateral low back pain with left-sided sciatica - Plan: PT plan of care cert/re-cert     Problem List Patient Active Problem List   Diagnosis Date Noted  . BPPV (benign paroxysmal positional vertigo) 01/07/2017  . Urinary incontinence without sensory awareness 12/17/2016  . Injury of left rotator cuff 12/02/2016  . Acute myofascial strain of lumbosacral region 08/20/2016  . Venous (peripheral) insufficiency 11/08/2015  . Chronic low back pain 11/08/2015  . HTN (hypertension) 02/09/2014  . Preventative health care 02/09/2014  . 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 Metabolic syndrome 173/53/2992 . Pectus excavatum 09/22/2012  . Hepatic steatosis 09/22/2012  . Chronic left maxillary sinus disease 09/22/2012  . Prediabetes 09/22/2012  . Family history of leukemia 09/22/2012  . Proteinuria 07/17/2012  . INSOMNIA UNSPECIFIED 05/01/2010  . Hypothyroidism 04/05/2008  . ANEMIA, IRON DEFICIENCY, history of 04/05/2008  . Hypertriglyceridemia 08/31/2006  . OBESITY 08/31/2006  . PANIC DISORDER 08/31/2006  . CHOLECYSTECTOMY, HX OF 08/31/2006    AMyra Rude PT 04/15/2017, 12:27 PM  [facility] [place] [address]Phone: [phone]  Fax:  [phone] Name: Paige SHADDUCKMRN: [id number] of Birth: 108-17-1962   Patient  did not return for treatment.  No goals met.   JRaeford Razor PT 06/17/17 2:55 PM Phone: [phone]: [phone]

## 2017-04-19 ENCOUNTER — Ambulatory Visit: Payer: No Typology Code available for payment source | Admitting: Physical Therapy

## 2017-04-23 ENCOUNTER — Encounter: Payer: Self-pay | Admitting: Primary Care

## 2017-04-23 ENCOUNTER — Ambulatory Visit (INDEPENDENT_AMBULATORY_CARE_PROVIDER_SITE_OTHER): Payer: No Typology Code available for payment source | Admitting: Primary Care

## 2017-04-23 VITALS — BP 120/78 | HR 71 | Temp 98.1°F | Ht 61.0 in | Wt 275.1 lb

## 2017-04-23 DIAGNOSIS — R109 Unspecified abdominal pain: Secondary | ICD-10-CM

## 2017-04-23 DIAGNOSIS — I10 Essential (primary) hypertension: Secondary | ICD-10-CM

## 2017-04-23 LAB — POC URINALSYSI DIPSTICK (AUTOMATED)
Bilirubin, UA: NEGATIVE
Glucose, UA: NEGATIVE
Ketones, UA: NEGATIVE
Nitrite, UA: NEGATIVE
PH UA: 6 (ref 5.0–8.0)
RBC UA: NEGATIVE
Spec Grav, UA: 1.025 (ref 1.010–1.025)
Urobilinogen, UA: 0.2 E.U./dL

## 2017-04-23 LAB — CBC WITH DIFFERENTIAL/PLATELET
BASOS PCT: 0 %
Basophils Absolute: 0 cells/uL (ref 0–200)
Eosinophils Absolute: 106 cells/uL (ref 15–500)
Eosinophils Relative: 1 %
HEMATOCRIT: 40.7 % (ref 35.0–45.0)
Hemoglobin: 13.5 g/dL (ref 11.7–15.5)
LYMPHS PCT: 15 %
Lymphs Abs: 1590 cells/uL (ref 850–3900)
MCH: 30.4 pg (ref 27.0–33.0)
MCHC: 33.2 g/dL (ref 32.0–36.0)
MCV: 91.7 fL (ref 80.0–100.0)
MONO ABS: 424 {cells}/uL (ref 200–950)
MONOS PCT: 4 %
MPV: 10.5 fL (ref 7.5–12.5)
NEUTROS PCT: 80 %
Neutro Abs: 8480 cells/uL — ABNORMAL HIGH (ref 1500–7800)
PLATELETS: 266 10*3/uL (ref 140–400)
RBC: 4.44 MIL/uL (ref 3.80–5.10)
RDW: 13.7 % (ref 11.0–15.0)
WBC: 10.6 10*3/uL (ref 3.8–10.8)

## 2017-04-23 NOTE — Progress Notes (Signed)
Subjective:    Patient ID: [name], female    DOB: 1961/09/27, 56 y.o.   MRN: [id number]  HPI  [name] is a 56 year old female who presents today for follow up of hypertension. She presented as a new patient in late June with numerous elevated blood pressure readings. She was already on lisinopril and HCTZ, so Amlodipine 10 mg was added.  Her blood pressure today is 120/78. She's checking his BP at home and is getting 116-140's/80's. She continues to experience dizziness, no worse than usual. She's also reporting left flank pain, urinary frequency. Her symptoms began 10 days ago. Her flank pain is improved with massage. She denies foul smelling urine, hematuria.   Review of Systems  Respiratory: Negative for shortness of breath.   Cardiovascular: Negative for chest pain.  Genitourinary: Positive for flank pain. Negative for pelvic pain and vaginal discharge.  Musculoskeletal: Positive for back pain.       Past Medical History:  Diagnosis Date  . Allergic rhinitis   . Anemia, iron deficiency   . Anginal pain [facility])    last cardiologist visit 2010 - dx/ed with anxiety instead of cardiac pain  . Arthritis   . Atypical chest pain    Negative cardiolite at [facility] cards 11/07  . CHOLECYSTECTOMY, HX OF    Annotation: 2000 Qualifier: Diagnosis of  By: Eddie Dibbles MD, Bhakti    . Elevated BP   . History of blood transfusion 03/2008   Requried 2u prbc; menometrorrhagia, uterine fibroids.  . Hypertension   . Hypertriglyceridemia    164 - 2/11  . Hypothyroidism   . Insomnia   . Obesity    250 lbs 8/11  . Panic disorder 11/05  . Perirectal abscess 11/14/2011  . Shortness of breath 05/18/2012   w/exertion"     Social History   Social History  . Marital status: Married    Spouse name: N/A  . Number of children: N/A  . Years of education: N/A   Occupational History  . Not on file.   Social History Main Topics  . Smoking status: Never Smoker  . Smokeless tobacco: Never Used   . Alcohol use No  . Drug use: No  . Sexual activity: Not Currently   Other Topics Concern  . Not on file   Social History Narrative   Currently unemployed. Starting college in Aug for accounting.  Married, has 2 children lives with her youngest son.      Past Surgical History:  Procedure Laterality Date  . ABDOMINAL HYSTERECTOMY  04/2007   total abdominal hysterectomy ( cervix) with bilateral salpingo-oopherectomy  . CHOLECYSTECTOMY  07/30/1999   Diagnosis of  By: Eddie Dibbles MD, Bhakti    . DILATION AND CURETTAGE OF UTERUS  1987  . INCISION AND DRAINAGE ABSCESS  11/02/2012   Procedure: INCISION AND DRAINAGE ABSCESS;  Surgeon: Odis Hollingshead, MD;  Location: [place];  Service: General;  Laterality: N/A;  I&D anorectal abscess  . INCISION AND DRAINAGE PERIRECTAL ABSCESS  11/14/2011   Procedure: IRRIGATION AND DEBRIDEMENT PERIRECTAL ABSCESS;  Surgeon: Zenovia Jarred, MD;  Location: [place];  Service: General;  Laterality: N/A;  . NERVE, TENDON AND ARTERY REPAIR Left 06/11/2013   Procedure: EXPLORATION AND REPAIR LEFT FOREARM;  Surgeon: Schuyler Amor, MD;  Location: [place];  Service: Orthopedics;  Laterality: Left;  . SHOULDER ARTHROSCOPY WITH ROTATOR CUFF REPAIR AND SUBACROMIAL DECOMPRESSION Left 03/06/2016   Procedure: LEFT SHOULDER ARTHROSCOPY WITH ROTATOR CUFF REPAIR AND  SUBACROMIAL DECOMPRESSION, DISTAL CLAVICLE EXCISION;  Surgeon: Leandrew Koyanagi, MD;  Location: [place];  Service: Orthopedics;  Laterality: Left;    Family History  Problem Relation Age of Onset  . Heart attack Mother        at the age of 13's  . Lung cancer Mother        metestatic, + smoker  . Aneurysm Mother   . Hyperlipidemia Mother   . Hypertension Mother   . Hypertension Father   . Heart attack Maternal Grandmother        at the age of 89's    Allergies  Allergen Reactions  . Doxycycline Nausea And Vomiting  . Dilaudid [Hydromorphone Hcl] Other (See Comments)    Panic attack  . Sulfur Rash    Itching with  rash    Current Outpatient Prescriptions on File Prior to Visit  Medication Sig Dispense Refill  . acetaminophen (TYLENOL) 500 MG tablet Take 1,000 mg by mouth every 8 (eight) hours as needed for mild pain.    Marland Kitchen amLODipine (NORVASC) 10 MG tablet Take 1 tablet by mouth once daily for blood pressure. 30 tablet 0  . buPROPion (WELLBUTRIN XL) 300 MG 24 hr tablet Take 300 mg by mouth daily.    . cyclobenzaprine (FLEXERIL) 5 MG tablet Take 1 tablet (5 mg total) by mouth 2 (two) times daily as needed for muscle spasms. 30 tablet 0  . Elastic Bandages & Supports (MEDICAL COMPRESSION STOCKINGS) MISC 1 each by Does not apply route as directed. 1 each 0  . gabapentin (NEURONTIN) 300 MG capsule Take 1 capsule (300 mg total) by mouth 3 (three) times daily. 90 capsule 2  . hydrochlorothiazide (HYDRODIURIL) 25 MG tablet Take 1 tablet (25 mg total) by mouth daily. 30 tablet 11  . levothyroxine (SYNTHROID) 88 MCG tablet Take 1 tablet by mouth every morning on an empty stomach with a full glass of water. 90 tablet 0  . lisinopril (PRINIVIL,ZESTRIL) 30 MG tablet Take 1 tablet (30 mg total) by mouth daily. 90 tablet 2  . PARoxetine (PAXIL) 20 MG tablet Take 1.5 tablets (30 mg total) by mouth daily. 135 tablet 2  . traZODone (DESYREL) 100 MG tablet Take 100 mg by mouth at bedtime.     No current facility-administered medications on file prior to visit.     BP 120/78   Pulse 71   Temp 98.1 F (36.7 C) (Oral)   Ht 5\' 1"  (1.549 m)   Wt 275 lb 1.9 oz (124.8 kg)   LMP 06/17/2007   SpO2 98%   BMI 51.98 kg/m    Objective:   Physical Exam  Constitutional: She appears well-nourished.  Neck: Neck supple.  Cardiovascular: Normal rate and regular rhythm.   Pulmonary/Chest: Effort normal and breath sounds normal.  Abdominal: Soft. Bowel sounds are normal. There is tenderness in the left upper quadrant. There is CVA tenderness.  Positive left CVA tenderness  Skin: Skin is warm and dry.            Assessment & Plan:  Flank Pain:  Located to left flank x 10 days. Also with urinary frequency. Exam today with left CVA tenderness, this could be MSK. Does not appear sickly or uncomfortable. UA: 1+ leuks, trace protein, negative nitrites and blood. Culture sent. Will await results.  Sheral Flow, NP

## 2017-04-23 NOTE — Patient Instructions (Signed)
Complete lab work prior to leaving today. I will notify you of your results once received.   Continue lisinopril 30 mg, hydrochlorothiazide 25 mg, Amlodipine 10 mg for blood pressure.   We will be in touch regarding our thyroid function in August.  It was a pleasure to see you today!

## 2017-04-24 LAB — BASIC METABOLIC PANEL
BUN: 22 mg/dL (ref 7–25)
CHLORIDE: 102 mmol/L (ref 98–110)
CO2: 23 mmol/L (ref 20–31)
CREATININE: 1.31 mg/dL — AB (ref 0.50–1.05)
Calcium: 9.7 mg/dL (ref 8.6–10.4)
Glucose, Bld: 108 mg/dL — ABNORMAL HIGH (ref 65–99)
POTASSIUM: 4.2 mmol/L (ref 3.5–5.3)
Sodium: 138 mmol/L (ref 135–146)

## 2017-04-26 ENCOUNTER — Other Ambulatory Visit: Payer: Self-pay | Admitting: Primary Care

## 2017-04-26 DIAGNOSIS — N3000 Acute cystitis without hematuria: Secondary | ICD-10-CM

## 2017-04-26 LAB — URINE CULTURE

## 2017-04-26 MED ORDER — CEPHALEXIN 500 MG PO CAPS: 500.0000 mg | ORAL_CAPSULE | Freq: Two times a day (BID) | ORAL | 0 refills | Status: DC

## 2017-04-27 ENCOUNTER — Encounter: Payer: Self-pay | Admitting: Primary Care

## 2017-04-27 ENCOUNTER — Encounter: Payer: No Typology Code available for payment source | Admitting: Physical Therapy

## 2017-04-27 DIAGNOSIS — N3000 Acute cystitis without hematuria: Secondary | ICD-10-CM

## 2017-04-27 MED ORDER — CIPROFLOXACIN HCL 250 MG PO TABS: 250.0000 mg | ORAL_TABLET | Freq: Two times a day (BID) | ORAL | 0 refills | Status: AC

## 2017-04-27 NOTE — Telephone Encounter (Signed)
Pt left v/m with information from email; Allie Bossier NP has already sent response. I called pt and she has already gotten email. Pt will cb if needed.

## 2017-04-29 ENCOUNTER — Encounter: Payer: No Typology Code available for payment source | Admitting: Physical Therapy

## 2017-04-29 ENCOUNTER — Encounter: Payer: Self-pay | Admitting: Primary Care

## 2017-05-05 ENCOUNTER — Encounter: Payer: No Typology Code available for payment source | Admitting: Physical Therapy

## 2017-05-07 ENCOUNTER — Encounter: Payer: No Typology Code available for payment source | Admitting: Physical Therapy

## 2017-05-10 ENCOUNTER — Encounter: Payer: No Typology Code available for payment source | Admitting: Physical Therapy

## 2017-05-12 ENCOUNTER — Encounter: Payer: No Typology Code available for payment source | Admitting: Physical Therapy

## 2017-05-13 ENCOUNTER — Other Ambulatory Visit: Payer: No Typology Code available for payment source

## 2017-05-14 ENCOUNTER — Other Ambulatory Visit (INDEPENDENT_AMBULATORY_CARE_PROVIDER_SITE_OTHER): Payer: No Typology Code available for payment source

## 2017-05-14 DIAGNOSIS — E039 Hypothyroidism, unspecified: Secondary | ICD-10-CM

## 2017-05-14 LAB — TSH: TSH: 2.76 u[IU]/mL (ref 0.35–4.50)

## 2017-05-16 ENCOUNTER — Emergency Department (HOSPITAL_COMMUNITY)
Admission: EM | Admit: 2017-05-16 | Discharge: 2017-05-16 | Disposition: A | Discharge status: Home / Self Care | Payer: No Typology Code available for payment source | Attending: Emergency Medicine | Admitting: Emergency Medicine

## 2017-05-16 ENCOUNTER — Encounter (HOSPITAL_COMMUNITY): Payer: Self-pay | Admitting: *Deleted

## 2017-05-16 DIAGNOSIS — K648 Other hemorrhoids: Secondary | ICD-10-CM | POA: Insufficient documentation

## 2017-05-16 DIAGNOSIS — K644 Residual hemorrhoidal skin tags: Secondary | ICD-10-CM

## 2017-05-16 DIAGNOSIS — Z79899 Other long term (current) drug therapy: Secondary | ICD-10-CM | POA: Insufficient documentation

## 2017-05-16 DIAGNOSIS — I10 Essential (primary) hypertension: Secondary | ICD-10-CM | POA: Insufficient documentation

## 2017-05-16 DIAGNOSIS — E039 Hypothyroidism, unspecified: Secondary | ICD-10-CM | POA: Insufficient documentation

## 2017-05-16 MED ORDER — HYDROCORTISONE ACETATE 25 MG RE SUPP
25.0000 mg | Freq: Once | RECTAL | Status: AC
  Administered 2017-05-16: 25 mg via RECTAL
  Filled 2017-05-16: qty 1

## 2017-05-16 MED ORDER — DIBUCAINE 1 % RE OINT: 1.0000 "application " | TOPICAL_OINTMENT | RECTAL | 0 refills | Status: DC | PRN

## 2017-05-16 MED ORDER — LIDOCAINE HCL 2 % EX GEL
1.0000 "application " | Freq: Once | CUTANEOUS | Status: AC
  Administered 2017-05-16: 1 via TOPICAL
  Filled 2017-05-16: qty 20

## 2017-05-16 NOTE — ED Triage Notes (Signed)
To ED for eval hemorrhoids which she noticed a couple of days ago. Painful. Pt states she has had diarrhea since yesterday. No rectal bleeding noted.

## 2017-05-16 NOTE — ED Provider Notes (Signed)
[facility] DEPT Provider Note   CSN: [id number] Arrival date & time: 05/16/17  1811     History   Chief Complaint Chief Complaint  Patient presents with  . Rectal Pain    HPI [name] is a 56 y.o. female.  The history is provided by the patient. No language interpreter was used.    DENALY Jensen is a 56 y.o. female who presents to the Emergency Department complaining of rectal pain.  She reports several days of rectal pain and feeling two bumps, one the sites of a grape. The pain has been worsening over the last several days despite using sitz baths, ibuprofen, Preparation H wipes, ice. She has a history of similar symptoms in the past and has been diagnosed with hemorrhoids as well as perirectal abscesses. No fevers, abdominal pain, vomiting. She did have diarrhea yesterday.  Past Medical History:  Diagnosis Date  . Allergic rhinitis   . Anemia, iron deficiency   . Anginal pain [facility])    last cardiologist visit 2010 - dx/ed with anxiety instead of cardiac pain  . Arthritis   . Atypical chest pain    Negative cardiolite at [facility] cards 11/07  . CHOLECYSTECTOMY, HX OF    Annotation: 2000 Qualifier: Diagnosis of  By: Eddie Dibbles MD, Bhakti    . Elevated BP   . History of blood transfusion 03/2008   Requried 2u prbc; menometrorrhagia, uterine fibroids.  . Hypertension   . Hypertriglyceridemia    164 - 2/11  . Hypothyroidism   . Insomnia   . Obesity    250 lbs 8/11  . Panic disorder 11/05  . Perirectal abscess 11/14/2011  . Shortness of breath 05/18/2012   w/exertion"    Patient Active Problem List   Diagnosis Date Noted  . BPPV (benign paroxysmal positional vertigo) 01/07/2017  . Urinary incontinence without sensory awareness 12/17/2016  . Injury of left rotator cuff 12/02/2016  . Acute myofascial strain of lumbosacral region 08/20/2016  . Venous (peripheral) insufficiency 11/08/2015  . Chronic low back pain 11/08/2015  . HTN (hypertension) 02/09/2014  .  Preventative health care 02/09/2014  . 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 Metabolic syndrome 08/65/7846  . Pectus excavatum 09/22/2012  . Hepatic steatosis 09/22/2012  . Chronic left maxillary sinus disease 09/22/2012  . Prediabetes 09/22/2012  . Family history of leukemia 09/22/2012  . Proteinuria 07/17/2012  . INSOMNIA UNSPECIFIED 05/01/2010  . Hypothyroidism 04/05/2008  . ANEMIA, IRON DEFICIENCY, history of 04/05/2008  . Hypertriglyceridemia 08/31/2006  . OBESITY 08/31/2006  . PANIC DISORDER 08/31/2006  . CHOLECYSTECTOMY, HX OF 08/31/2006    Past Surgical History:  Procedure Laterality Date  . ABDOMINAL HYSTERECTOMY  04/2007   total abdominal hysterectomy ( cervix) with bilateral salpingo-oopherectomy  . CHOLECYSTECTOMY  07/30/1999   Diagnosis of  By: Eddie Dibbles MD, Bhakti    . DILATION AND CURETTAGE OF UTERUS  1987  . INCISION AND DRAINAGE ABSCESS  11/02/2012   Procedure: INCISION AND DRAINAGE ABSCESS;  Surgeon: Odis Hollingshead, MD;  Location: [place];  Service: General;  Laterality: N/A;  I&D anorectal abscess  . INCISION AND DRAINAGE PERIRECTAL ABSCESS  11/14/2011   Procedure: IRRIGATION AND DEBRIDEMENT PERIRECTAL ABSCESS;  Surgeon: Zenovia Jarred, MD;  Location: [place];  Service: General;  Laterality: N/A;  . NERVE, TENDON AND ARTERY REPAIR Left 06/11/2013   Procedure: EXPLORATION AND REPAIR LEFT FOREARM;  Surgeon: Schuyler Amor, MD;  Location: [place];  Service: Orthopedics;  Laterality: Left;  . SHOULDER ARTHROSCOPY WITH  ROTATOR CUFF REPAIR AND SUBACROMIAL DECOMPRESSION Left 03/06/2016   Procedure: LEFT SHOULDER ARTHROSCOPY WITH ROTATOR CUFF REPAIR AND SUBACROMIAL DECOMPRESSION, DISTAL CLAVICLE EXCISION;  Surgeon: Leandrew Koyanagi, MD;  Location: [place];  Service: Orthopedics;  Laterality: Left;    OB History    No data available       Home Medications    Prior to Admission medications   Medication Sig Start Date End Date Taking? Authorizing Provider  acetaminophen  (TYLENOL) 500 MG tablet Take 1,000 mg by mouth every 8 (eight) hours as needed for mild pain.    [provider]  amLODipine (NORVASC) 10 MG tablet Take 1 tablet by mouth once daily for blood pressure. 04/01/17   [name], NP  buPROPion (WELLBUTRIN XL) 300 MG 24 hr tablet Take 300 mg by mouth daily.    [provider]  cyclobenzaprine (FLEXERIL) 5 MG tablet Take 1 tablet (5 mg total) by mouth 2 (two) times daily as needed for muscle spasms. 12/17/16   Alphonzo Grieve, MD  Elastic Bandages & Supports ([place]) [place] 1 each by Does not apply route as directed. 11/08/15   Lucious Groves, DO  gabapentin (NEURONTIN) 300 MG capsule Take 1 capsule (300 mg total) by mouth 3 (three) times daily. 12/24/16 12/24/17  Norman Herrlich, MD  hydrochlorothiazide (HYDRODIURIL) 25 MG tablet Take 1 tablet (25 mg total) by mouth daily. 05/18/16   Burns, Arloa Koh, MD  levothyroxine (SYNTHROID) 88 MCG tablet Take 1 tablet by mouth every morning on an empty stomach with a full glass of water. 04/01/17 04/01/18  [name], NP  lisinopril (PRINIVIL,ZESTRIL) 30 MG tablet Take 1 tablet (30 mg total) by mouth daily. 02/13/17   Zada Finders, MD  PARoxetine (PAXIL) 20 MG tablet Take 1.5 tablets (30 mg total) by mouth daily. 07/13/16   Zada Finders, MD  traZODone (DESYREL) 100 MG tablet Take 100 mg by mouth at bedtime.    [provider]    Family History Family History  Problem Relation Age of Onset  . Heart attack Mother        at the age of 19's  . Lung cancer Mother        metestatic, + smoker  . Aneurysm Mother   . Hyperlipidemia Mother   . Hypertension Mother   . Hypertension Father   . Heart attack Maternal Grandmother        at the age of 80's    Social History Social History  Substance Use Topics  . Smoking status: Never Smoker  . Smokeless tobacco: Never Used  . Alcohol use No     Allergies   Cephalexin; Doxycycline; Dilaudid [hydromorphone  hcl]; and Sulfur   Review of Systems Review of Systems  All other systems reviewed and are negative.    Physical Exam Updated Vital Signs BP 107/64   Pulse 90   Temp 98.1 F (36.7 C) (Oral)   Resp 16   LMP 06/17/2007   SpO2 98%   Physical Exam  Constitutional: She is oriented to person, place, and time. She appears well-developed and well-nourished.  HENT:  Head: Normocephalic and atraumatic.  Cardiovascular: Normal rate and regular rhythm.   No murmur heard. Pulmonary/Chest: Effort normal and breath sounds normal. No respiratory distress.  Abdominal: Soft. There is no tenderness. There is no rebound and no guarding.  Genitourinary:     Musculoskeletal: She exhibits no edema or tenderness.  Neurological: She is alert and oriented to person,  place, and time.  Skin: Skin is warm and dry.  Psychiatric: She has a normal mood and affect. Her behavior is normal.  Nursing note and vitals reviewed.    ED Treatments / Results  Labs (all labs ordered are listed, but only abnormal results are displayed) Labs Reviewed - No data to display  EKG  EKG Interpretation None       Radiology No results found.  Procedures Procedures (including critical care time)  Medications Ordered in ED Medications - No data to display   Initial Impression / Assessment and Plan / ED Course  I have reviewed the triage vital signs and the nursing notes.  Pertinent labs & imaging results that were available during my care of the patient were reviewed by me and considered in my medical decision making (see chart for details).     Patient here for evaluation of rectal pain for last few days. She is nontoxic appearing on examination with evidence of 2 external nonthrombosed hemorrhoids with no evidence of infection. Counseled pt on homecare for hemorrhoids, surgery follow-up and return precautions.  Final Clinical Impressions(s) / ED Diagnoses   Final diagnoses:  External hemorrhoid      New Prescriptions New Prescriptions   No medications on file     Quintella Reichert, MD 05/16/17 1941

## 2017-05-16 NOTE — ED Notes (Signed)
Pt and family understood dc material and follow up information. NAD noted. Scripts given at [facility]

## 2017-05-17 ENCOUNTER — Encounter: Payer: Self-pay | Admitting: Primary Care

## 2017-05-17 DIAGNOSIS — K649 Unspecified hemorrhoids: Secondary | ICD-10-CM

## 2017-05-17 NOTE — Telephone Encounter (Signed)
[name],  This patient was referred to [facility] by the emergency department for hemorrhoid removal. Her insurance/discount program isn't accepted there. 1. Can you help? 2. Do I need to place another referral? Thanks! Allie Bossier, NP-C

## 2017-05-18 ENCOUNTER — Telehealth: Payer: Self-pay

## 2017-05-18 ENCOUNTER — Encounter (HOSPITAL_COMMUNITY): Payer: Self-pay | Admitting: *Deleted

## 2017-05-18 ENCOUNTER — Ambulatory Visit (INDEPENDENT_AMBULATORY_CARE_PROVIDER_SITE_OTHER): Payer: No Typology Code available for payment source | Admitting: Primary Care

## 2017-05-18 VITALS — BP 108/68 | HR 78 | Temp 97.7°F | Ht 60.5 in | Wt 268.8 lb

## 2017-05-18 DIAGNOSIS — G4700 Insomnia, unspecified: Secondary | ICD-10-CM | POA: Insufficient documentation

## 2017-05-18 DIAGNOSIS — R112 Nausea with vomiting, unspecified: Secondary | ICD-10-CM | POA: Insufficient documentation

## 2017-05-18 DIAGNOSIS — I129 Hypertensive chronic kidney disease with stage 1 through stage 4 chronic kidney disease, or unspecified chronic kidney disease: Secondary | ICD-10-CM | POA: Insufficient documentation

## 2017-05-18 DIAGNOSIS — R197 Diarrhea, unspecified: Secondary | ICD-10-CM

## 2017-05-18 DIAGNOSIS — Z882 Allergy status to sulfonamides status: Secondary | ICD-10-CM | POA: Insufficient documentation

## 2017-05-18 DIAGNOSIS — Z9049 Acquired absence of other specified parts of digestive tract: Secondary | ICD-10-CM | POA: Insufficient documentation

## 2017-05-18 DIAGNOSIS — H8110 Benign paroxysmal vertigo, unspecified ear: Secondary | ICD-10-CM | POA: Insufficient documentation

## 2017-05-18 DIAGNOSIS — M1990 Unspecified osteoarthritis, unspecified site: Secondary | ICD-10-CM | POA: Insufficient documentation

## 2017-05-18 DIAGNOSIS — F329 Major depressive disorder, single episode, unspecified: Secondary | ICD-10-CM | POA: Insufficient documentation

## 2017-05-18 DIAGNOSIS — E039 Hypothyroidism, unspecified: Secondary | ICD-10-CM | POA: Insufficient documentation

## 2017-05-18 DIAGNOSIS — Z885 Allergy status to narcotic agent status: Secondary | ICD-10-CM | POA: Insufficient documentation

## 2017-05-18 DIAGNOSIS — N179 Acute kidney failure, unspecified: Principal | ICD-10-CM | POA: Insufficient documentation

## 2017-05-18 DIAGNOSIS — E669 Obesity, unspecified: Secondary | ICD-10-CM | POA: Insufficient documentation

## 2017-05-18 DIAGNOSIS — M545 Low back pain: Secondary | ICD-10-CM | POA: Insufficient documentation

## 2017-05-18 DIAGNOSIS — N183 Chronic kidney disease, stage 3 (moderate): Secondary | ICD-10-CM | POA: Insufficient documentation

## 2017-05-18 DIAGNOSIS — F410 Panic disorder [episodic paroxysmal anxiety] without agoraphobia: Secondary | ICD-10-CM | POA: Insufficient documentation

## 2017-05-18 DIAGNOSIS — R1030 Lower abdominal pain, unspecified: Secondary | ICD-10-CM

## 2017-05-18 DIAGNOSIS — K760 Fatty (change of) liver, not elsewhere classified: Secondary | ICD-10-CM | POA: Insufficient documentation

## 2017-05-18 DIAGNOSIS — Z888 Allergy status to other drugs, medicaments and biological substances status: Secondary | ICD-10-CM | POA: Insufficient documentation

## 2017-05-18 DIAGNOSIS — E860 Dehydration: Secondary | ICD-10-CM | POA: Insufficient documentation

## 2017-05-18 DIAGNOSIS — Z881 Allergy status to other antibiotic agents status: Secondary | ICD-10-CM | POA: Insufficient documentation

## 2017-05-18 DIAGNOSIS — I872 Venous insufficiency (chronic) (peripheral): Secondary | ICD-10-CM | POA: Insufficient documentation

## 2017-05-18 DIAGNOSIS — G8929 Other chronic pain: Secondary | ICD-10-CM | POA: Insufficient documentation

## 2017-05-18 LAB — CBC WITH DIFFERENTIAL/PLATELET
BASOS ABS: 0 10*3/uL (ref 0.0–0.1)
Basophils Relative: 0.4 % (ref 0.0–3.0)
EOS ABS: 0.1 10*3/uL (ref 0.0–0.7)
Eosinophils Relative: 1.2 % (ref 0.0–5.0)
HCT: 40.3 % (ref 36.0–46.0)
Hemoglobin: 13.4 g/dL (ref 12.0–15.0)
LYMPHS ABS: 1 10*3/uL (ref 0.7–4.0)
Lymphocytes Relative: 9.9 % — ABNORMAL LOW (ref 12.0–46.0)
MCHC: 33.3 g/dL (ref 30.0–36.0)
MCV: 92.2 fl (ref 78.0–100.0)
MONO ABS: 0.7 10*3/uL (ref 0.1–1.0)
MONOS PCT: 6.3 % (ref 3.0–12.0)
NEUTROS ABS: 8.6 10*3/uL — AB (ref 1.4–7.7)
NEUTROS PCT: 82.2 % — AB (ref 43.0–77.0)
PLATELETS: 290 10*3/uL (ref 150.0–400.0)
RBC: 4.37 Mil/uL (ref 3.87–5.11)
RDW: 13.9 % (ref 11.5–15.5)
WBC: 10.4 10*3/uL (ref 4.0–10.5)

## 2017-05-18 LAB — COMPREHENSIVE METABOLIC PANEL
ALK PHOS: 106 U/L (ref 39–117)
ALT: 23 U/L (ref 0–35)
AST: 19 U/L (ref 0–37)
Albumin: 4.1 g/dL (ref 3.5–5.2)
BILIRUBIN TOTAL: 0.4 mg/dL (ref 0.2–1.2)
BUN: 34 mg/dL — AB (ref 6–23)
CO2: 19 meq/L (ref 19–32)
Calcium: 9.3 mg/dL (ref 8.4–10.5)
Chloride: 109 mEq/L (ref 96–112)
Creatinine, Ser: 2 mg/dL — ABNORMAL HIGH (ref 0.40–1.20)
GFR: 27.63 mL/min — AB (ref >60.00)
GLUCOSE: 99 mg/dL (ref 70–99)
Potassium: 4.1 mEq/L (ref 3.5–5.1)
SODIUM: 137 meq/L (ref 135–145)
TOTAL PROTEIN: 8 g/dL (ref 6.0–8.3)

## 2017-05-18 LAB — URINALYSIS, ROUTINE W REFLEX MICROSCOPIC
Bilirubin Urine: NEGATIVE
Glucose, UA: NEGATIVE mg/dL
KETONES UR: NEGATIVE mg/dL
Nitrite: NEGATIVE
PH: 5 (ref 5.0–8.0)
PROTEIN: 30 mg/dL — AB
Specific Gravity, Urine: 1.015 (ref 1.005–1.030)

## 2017-05-18 MED ORDER — PROMETHAZINE HCL 12.5 MG PO TABS: 12.5000 mg | ORAL_TABLET | Freq: Three times a day (TID) | ORAL | 0 refills | Status: DC | PRN

## 2017-05-18 MED ORDER — ONDANSETRON 4 MG PO TBDP: 4.0000 mg | ORAL_TABLET | Freq: Three times a day (TID) | ORAL | 0 refills | Status: DC | PRN

## 2017-05-18 NOTE — Patient Instructions (Signed)
You may take ondansetron (Zofran) tablets as needed for nausea. Melt one tablet in your mouth every 8 hours as needed.  You must work on hydration with water and Gatorade. If you cannot start drinking in the next 24 hours then you need to go to the hospital.  Complete lab work prior to leaving today. I will notify you of your results once received.   If you start running high fevers, experience increase abdominal pain, feel worse then go to the hospital.  It was a pleasure to see you today!

## 2017-05-18 NOTE — Telephone Encounter (Signed)
Noted. Rx for phenergan was sent. Take 1 tablet by mouth every 8 hours as needed for nausea and vomiting. May cause drowsiness.

## 2017-05-18 NOTE — Telephone Encounter (Signed)
Spoken and notified patient of Kate's comments. Patient verbalized understanding. 

## 2017-05-18 NOTE — Telephone Encounter (Signed)
Pt was seen earlier today and pt said cost of zofran to pt was $90.92 and that is too expensive. Pt request substitute med sent to [facility]. Pt request cb when med sent to pharmacy.

## 2017-05-18 NOTE — Progress Notes (Signed)
Subjective:    Patient ID: [name], female    DOB: Feb 25, 1961, 56 y.o.   MRN: [id number]  HPI  [name] is a 56 year old female with a history of obesity, cholecystectomy who presents today with a chief complaint of diarrhea. She also reports nausea, vomiting, abdominal pain. Her symptoms began four days ago with diarrhea and abdominal pain, vomiting started two days ago. She's experiencing numerous episodes of diarrhea daily without improvement. She's vomiting 2-3 times daily on average. Her last episode of diarrhea and vomiting was in our office today.  She denies fevers, bloody stools. She was evaluated and treated for hemorrhoids in the Emergency Department. No labs or IV fluids provided. She's not eating or drinking anything due to the nausea. Her abdominal pain is located to the bilateral lower abdomen. She describes her pain as a constant cramping/sore pain. She does have evidence of colonic diverticulosis on CT abdomen/pelvis from 2012.  Review of Systems  Constitutional: Positive for fatigue. Negative for chills and fever.  Gastrointestinal: Positive for abdominal pain, diarrhea, nausea and vomiting. Negative for blood in stool.       Past Medical History:  Diagnosis Date  . Allergic rhinitis   . Anemia, iron deficiency   . Anginal pain [facility])    last cardiologist visit 2010 - dx/ed with anxiety instead of cardiac pain  . Arthritis   . Atypical chest pain    Negative cardiolite at [facility] cards 11/07  . CHOLECYSTECTOMY, HX OF    Annotation: 2000 Qualifier: Diagnosis of  By: Eddie Dibbles MD, Bhakti    . Elevated BP   . History of blood transfusion 03/2008   Requried 2u prbc; menometrorrhagia, uterine fibroids.  . Hypertension   . Hypertriglyceridemia    164 - 2/11  . Hypothyroidism   . Insomnia   . Obesity    250 lbs 8/11  . Panic disorder 11/05  . Perirectal abscess 11/14/2011  . Shortness of breath 05/18/2012   w/exertion"     Social History   Social History  .  Marital status: Married    Spouse name: N/A  . Number of children: N/A  . Years of education: N/A   Occupational History  . Not on file.   Social History Main Topics  . Smoking status: Never Smoker  . Smokeless tobacco: Never Used  . Alcohol use No  . Drug use: No  . Sexual activity: Not Currently   Other Topics Concern  . Not on file   Social History Narrative   Currently unemployed. Starting college in Aug for accounting.  Married, has 2 children lives with her youngest son.      Past Surgical History:  Procedure Laterality Date  . ABDOMINAL HYSTERECTOMY  04/2007   total abdominal hysterectomy ( cervix) with bilateral salpingo-oopherectomy  . CHOLECYSTECTOMY  07/30/1999   Diagnosis of  By: Eddie Dibbles MD, Bhakti    . DILATION AND CURETTAGE OF UTERUS  1987  . INCISION AND DRAINAGE ABSCESS  11/02/2012   Procedure: INCISION AND DRAINAGE ABSCESS;  Surgeon: Odis Hollingshead, MD;  Location: [place];  Service: General;  Laterality: N/A;  I&D anorectal abscess  . INCISION AND DRAINAGE PERIRECTAL ABSCESS  11/14/2011   Procedure: IRRIGATION AND DEBRIDEMENT PERIRECTAL ABSCESS;  Surgeon: Zenovia Jarred, MD;  Location: [place];  Service: General;  Laterality: N/A;  . NERVE, TENDON AND ARTERY REPAIR Left 06/11/2013   Procedure: EXPLORATION AND REPAIR LEFT FOREARM;  Surgeon: Schuyler Amor, MD;  Location:  [place] [facility];  Service: Orthopedics;  Laterality: Left;  . SHOULDER ARTHROSCOPY WITH ROTATOR CUFF REPAIR AND SUBACROMIAL DECOMPRESSION Left 03/06/2016   Procedure: LEFT SHOULDER ARTHROSCOPY WITH ROTATOR CUFF REPAIR AND SUBACROMIAL DECOMPRESSION, DISTAL CLAVICLE EXCISION;  Surgeon: Leandrew Koyanagi, MD;  Location: [place];  Service: Orthopedics;  Laterality: Left;    Family History  Problem Relation Age of Onset  . Heart attack Mother        at the age of 70's  . Lung cancer Mother        metestatic, + smoker  . Aneurysm Mother   . Hyperlipidemia Mother   . Hypertension Mother   . Hypertension Father     . Heart attack Maternal Grandmother        at the age of 26's    Allergies  Allergen Reactions  . Cephalexin Itching  . Doxycycline Nausea And Vomiting  . Dilaudid [Hydromorphone Hcl] Other (See Comments)    Panic attack  . Sulfur Rash    Itching with rash    Current Outpatient Prescriptions on File Prior to Visit  Medication Sig Dispense Refill  . acetaminophen (TYLENOL) 500 MG tablet Take 1,000 mg by mouth every 8 (eight) hours as needed for mild pain.    Marland Kitchen amLODipine (NORVASC) 10 MG tablet Take 1 tablet by mouth once daily for blood pressure. 30 tablet 0  . buPROPion (WELLBUTRIN XL) 300 MG 24 hr tablet Take 300 mg by mouth daily.    . cyclobenzaprine (FLEXERIL) 5 MG tablet Take 1 tablet (5 mg total) by mouth 2 (two) times daily as needed for muscle spasms. 30 tablet 0  . dibucaine (NUPERCAINAL) 1 % OINT Place 1 application rectally as needed for hemorrhoids. 28 g 0  . Elastic Bandages & Supports ([employer]) [place] 1 each by Does not apply route as directed. 1 each 0  . gabapentin (NEURONTIN) 300 MG capsule Take 1 capsule (300 mg total) by mouth 3 (three) times daily. 90 capsule 2  . hydrochlorothiazide (HYDRODIURIL) 25 MG tablet Take 1 tablet (25 mg total) by mouth daily. 30 tablet 11  . levothyroxine (SYNTHROID) 88 MCG tablet Take 1 tablet by mouth every morning on an empty stomach with a full glass of water. 90 tablet 0  . lisinopril (PRINIVIL,ZESTRIL) 30 MG tablet Take 1 tablet (30 mg total) by mouth daily. 90 tablet 2  . PARoxetine (PAXIL) 20 MG tablet Take 1.5 tablets (30 mg total) by mouth daily. 135 tablet 2  . traZODone (DESYREL) 100 MG tablet Take 100 mg by mouth at bedtime.     No current facility-administered medications on file prior to visit.     BP 108/68   Pulse 78   Temp 97.7 F (36.5 C) (Oral)   Ht 5' 0.5" (1.537 m)   Wt 268 lb 12.8 oz (121.9 kg)   LMP 06/17/2007   SpO2 98%   BMI 51.63 kg/m    Objective:   Physical Exam   Constitutional: She appears well-nourished. She does not have a sickly appearance.  Neck: Neck supple.  Cardiovascular: Normal rate and regular rhythm.   Pulmonary/Chest: Effort normal and breath sounds normal.  Abdominal: Soft. Normal appearance. Bowel sounds are increased. There is generalized tenderness. There is no rebound and no guarding.  Vitals reviewed.         Assessment & Plan:  Nausea/Vomting/Diarrhea:  Also with bilateral lower quadrant abdominal cramping. Exam today with generalized tenderness. Does appear tired, not sickly. Vital signs all stable.  Could be viral or bacterial involvement. Also consider acute diverticulitis. Check CBC and CMP today. Rx for Zofran sent to pharmacy. Strongly encouraged her to start working on intake of fluids. Discussed that if no increase in fluid intake in 24 hours then she will need to be evaluated in the emergency department for IV fluids. Vitals are stable now. Also discussed other strict emergency department precautions.  Sheral Flow, NP

## 2017-05-18 NOTE — ED Triage Notes (Signed)
Pt has had NVD x 5 days with lower abdominal pain. Seen her pcp today who told her to come to the ED. BUN 34 and creatinine 2.0; CMP and CBC results listed in chart review.

## 2017-05-19 ENCOUNTER — Encounter (HOSPITAL_COMMUNITY): Payer: Self-pay | Admitting: Internal Medicine

## 2017-05-19 ENCOUNTER — Observation Stay (HOSPITAL_COMMUNITY)
Admission: EM | Admit: 2017-05-19 | Discharge: 2017-05-20 | Disposition: A | Discharge status: Home / Self Care | Payer: No Typology Code available for payment source | Attending: Internal Medicine | Admitting: Internal Medicine

## 2017-05-19 DIAGNOSIS — R197 Diarrhea, unspecified: Secondary | ICD-10-CM

## 2017-05-19 DIAGNOSIS — G8929 Other chronic pain: Secondary | ICD-10-CM | POA: Diagnosis present

## 2017-05-19 DIAGNOSIS — R110 Nausea: Secondary | ICD-10-CM

## 2017-05-19 DIAGNOSIS — R112 Nausea with vomiting, unspecified: Secondary | ICD-10-CM

## 2017-05-19 DIAGNOSIS — I10 Essential (primary) hypertension: Secondary | ICD-10-CM | POA: Diagnosis present

## 2017-05-19 DIAGNOSIS — N179 Acute kidney failure, unspecified: Secondary | ICD-10-CM

## 2017-05-19 DIAGNOSIS — E039 Hypothyroidism, unspecified: Secondary | ICD-10-CM | POA: Diagnosis present

## 2017-05-19 DIAGNOSIS — I872 Venous insufficiency (chronic) (peripheral): Secondary | ICD-10-CM | POA: Diagnosis present

## 2017-05-19 DIAGNOSIS — H8110 Benign paroxysmal vertigo, unspecified ear: Secondary | ICD-10-CM | POA: Diagnosis present

## 2017-05-19 DIAGNOSIS — M545 Low back pain: Secondary | ICD-10-CM

## 2017-05-19 DIAGNOSIS — D509 Iron deficiency anemia, unspecified: Secondary | ICD-10-CM | POA: Diagnosis present

## 2017-05-19 HISTORY — DX: Nausea: R11.0

## 2017-05-19 HISTORY — DX: Diarrhea, unspecified: R19.7

## 2017-05-19 HISTORY — DX: Acute kidney failure, unspecified: N17.9

## 2017-05-19 LAB — I-STAT CHEM 8, ED
BUN: 38 mg/dL — ABNORMAL HIGH (ref 6–20)
Calcium, Ion: 1.14 mmol/L — ABNORMAL LOW (ref 1.15–1.40)
Chloride: 112 mmol/L — ABNORMAL HIGH (ref 101–111)
Creatinine, Ser: 2.6 mg/dL — ABNORMAL HIGH (ref 0.44–1.00)
Glucose, Bld: 102 mg/dL — ABNORMAL HIGH (ref 65–99)
HCT: 41 % (ref 36.0–46.0)
Hemoglobin: 13.9 g/dL (ref 12.0–15.0)
Potassium: 4.2 mmol/L (ref 3.5–5.1)
Sodium: 140 mmol/L (ref 135–145)
TCO2: 18 mmol/L (ref 0–100)

## 2017-05-19 LAB — HIV ANTIBODY (ROUTINE TESTING W REFLEX): HIV SCREEN 4TH GENERATION: NONREACTIVE

## 2017-05-19 LAB — C DIFFICILE QUICK SCREEN W PCR REFLEX
C Diff antigen: NEGATIVE
C Diff interpretation: NOT DETECTED
C Diff toxin: NEGATIVE

## 2017-05-19 MED ORDER — GABAPENTIN 300 MG PO CAPS: 300.0000 mg | ORAL_CAPSULE | Freq: Three times a day (TID) | ORAL | Status: DC | PRN

## 2017-05-19 MED ORDER — BUPROPION HCL ER (XL) 150 MG PO TB24
300.0000 mg | ORAL_TABLET | Freq: Every day | ORAL | Status: DC
  Administered 2017-05-20: 300 mg via ORAL
  Filled 2017-05-19: qty 2

## 2017-05-19 MED ORDER — TRAZODONE HCL 100 MG PO TABS
100.0000 mg | ORAL_TABLET | Freq: Every day | ORAL | Status: DC
Start: 2017-05-19 — End: 2017-05-20
  Administered 2017-05-19: 100 mg via ORAL
  Filled 2017-05-19: qty 1

## 2017-05-19 MED ORDER — SODIUM CHLORIDE 0.9 % IV BOLUS (SEPSIS)
2000.0000 mL | Freq: Once | INTRAVENOUS | Status: AC
  Administered 2017-05-19: 2000 mL via INTRAVENOUS

## 2017-05-19 MED ORDER — ENOXAPARIN SODIUM 40 MG/0.4ML ~~LOC~~ SOLN
40.0000 mg | Freq: Every day | SUBCUTANEOUS | Status: DC
  Administered 2017-05-20: 40 mg via SUBCUTANEOUS
  Filled 2017-05-19 (×2): qty 0.4

## 2017-05-19 MED ORDER — ACETAMINOPHEN 650 MG RE SUPP: 650.0000 mg | Freq: Four times a day (QID) | RECTAL | Status: DC | PRN

## 2017-05-19 MED ORDER — LEVOTHYROXINE SODIUM 88 MCG PO TABS
88.0000 ug | ORAL_TABLET | Freq: Every day | ORAL | Status: DC
  Administered 2017-05-19 – 2017-05-20 (×2): 88 ug via ORAL
  Filled 2017-05-19 (×3): qty 1

## 2017-05-19 MED ORDER — ONDANSETRON HCL 4 MG/2ML IJ SOLN
4.0000 mg | INTRAMUSCULAR | Status: DC
Start: 2017-05-19 — End: 2017-05-20
  Administered 2017-05-19: 4 mg via INTRAVENOUS
  Filled 2017-05-19 (×3): qty 2

## 2017-05-19 MED ORDER — ACETAMINOPHEN 325 MG PO TABS: 650.0000 mg | ORAL_TABLET | Freq: Four times a day (QID) | ORAL | Status: DC | PRN

## 2017-05-19 MED ORDER — SODIUM CHLORIDE 0.9 % IV SOLN
INTRAVENOUS | Status: AC
  Administered 2017-05-19: 09:00:00 via INTRAVENOUS

## 2017-05-19 MED ORDER — PAROXETINE HCL 20 MG PO TABS
30.0000 mg | ORAL_TABLET | Freq: Every day | ORAL | Status: DC
  Administered 2017-05-20: 30 mg via ORAL
  Filled 2017-05-19: qty 1
  Filled 2017-05-19: qty 2

## 2017-05-19 NOTE — ED Notes (Signed)
Pt denies nausea at this time, ate 10% clear liquid meal tray.  Pt reports being comfortable.

## 2017-05-19 NOTE — Telephone Encounter (Signed)
Left message asking Estanislado Spire to call me [facility] [phone]

## 2017-05-19 NOTE — H&P (Signed)
History and Physical    [name] ZYS:[id number] DOB: Aug 01, 1961 DOA: 05/19/2017  PCP: [name], NP Patient coming from: home  Chief Complaint: n/v/diarrhea  HPI: [name] is a 56 y.o. female with medical history significant for obesity, cholecystectomy, hypothyroidism, hypertension, anemia, panic disorder presents to the emergency department with the chief complaint persistent nausea vomiting diarrhea. Initial evaluation reveals dehydration and acute kidney injury.  Information is obtained from the chart and the patient. She complains of five-day history persistent nausea vomiting diarrhea. She states she saw primary care provider yesterday she was given Phenergan. She states she's had at least 3 episodes of emesis on average per day. She denies any coffee ground emesis. She also states she's had watery stools more than she can count. She denies any melena or bright red blood per rectum. She's been unable to keep any food or water down. She also reports generalized weakness some lightheadedness. She denies headache chest pain palpitations shortness of breath. She denies syncope or near-syncope. She reports taking Cipro for urinary tract infection 2-3 weeks ago. She took it for 5 days and completed the course. She denies any dysuria hematuria frequency or urgency.  ED Course: In the emergency department he is afebrile hemodynamically stable and not hypoxic. She is provided with vigorous IV fluids and Zofran. At the time of admission she was feeling better  Review of Systems: As per HPI otherwise all other systems reviewed and are negative.   Ambulatory Status: Ambulates independently is independent with ADLs  Past Medical History:  Diagnosis Date  . Allergic rhinitis   . Anemia, iron deficiency   . Anginal pain [facility])    last cardiologist visit 2010 - dx/ed with anxiety instead of cardiac pain  . Arthritis   . Atypical chest pain    Negative cardiolite at [facility] cards  11/07  . CHOLECYSTECTOMY, HX OF    Annotation: 2000 Qualifier: Diagnosis of  By: Eddie Dibbles MD, Bhakti    . Elevated BP   . History of blood transfusion 03/2008   Requried 2u prbc; menometrorrhagia, uterine fibroids.  . Hypertension   . Hypertriglyceridemia    164 - 2/11  . Hypothyroidism   . Insomnia   . Obesity    250 lbs 8/11  . Panic disorder 11/05  . Perirectal abscess 11/14/2011  . Shortness of breath 05/18/2012   w/exertion"    Past Surgical History:  Procedure Laterality Date  . ABDOMINAL HYSTERECTOMY  04/2007   total abdominal hysterectomy ( cervix) with bilateral salpingo-oopherectomy  . CHOLECYSTECTOMY  07/30/1999   Diagnosis of  By: Eddie Dibbles MD, Bhakti    . DILATION AND CURETTAGE OF UTERUS  1987  . INCISION AND DRAINAGE ABSCESS  11/02/2012   Procedure: INCISION AND DRAINAGE ABSCESS;  Surgeon: Odis Hollingshead, MD;  Location: [place];  Service: General;  Laterality: N/A;  I&D anorectal abscess  . INCISION AND DRAINAGE PERIRECTAL ABSCESS  11/14/2011   Procedure: IRRIGATION AND DEBRIDEMENT PERIRECTAL ABSCESS;  Surgeon: Zenovia Jarred, MD;  Location: [place];  Service: General;  Laterality: N/A;  . NERVE, TENDON AND ARTERY REPAIR Left 06/11/2013   Procedure: EXPLORATION AND REPAIR LEFT FOREARM;  Surgeon: Schuyler Amor, MD;  Location: [place];  Service: Orthopedics;  Laterality: Left;  . SHOULDER ARTHROSCOPY WITH ROTATOR CUFF REPAIR AND SUBACROMIAL DECOMPRESSION Left 03/06/2016   Procedure: LEFT SHOULDER ARTHROSCOPY WITH ROTATOR CUFF REPAIR AND SUBACROMIAL DECOMPRESSION, DISTAL CLAVICLE EXCISION;  Surgeon: Leandrew Koyanagi, MD;  Location: [place];  Service: Orthopedics;  Laterality: Left;    Social History   Social History  . Marital status: Married    Spouse name: N/A  . Number of children: N/A  . Years of education: N/A   Occupational History  . Not on file.   Social History Main Topics  . Smoking status: Never Smoker  . Smokeless tobacco: Never Used  . Alcohol use No  . Drug use:  No  . Sexual activity: Not Currently   Other Topics Concern  . Not on file   Social History Narrative   Currently unemployed. Starting college in Aug for accounting.  Married, has 2 children lives with her youngest son.      Allergies  Allergen Reactions  . Cephalexin Itching  . Doxycycline Nausea And Vomiting  . Dilaudid [Hydromorphone Hcl] Other (See Comments)    Panic attack  . Sulfur Rash    Itching with rash    Family History  Problem Relation Age of Onset  . Heart attack Mother        at the age of 16's  . Lung cancer Mother        metestatic, + smoker  . Aneurysm Mother   . Hyperlipidemia Mother   . Hypertension Mother   . Hypertension Father   . Heart attack Maternal Grandmother        at the age of 33's    Prior to Admission medications   Medication Sig Start Date End Date Taking? Authorizing Provider  acetaminophen (TYLENOL) 500 MG tablet Take 1,000 mg by mouth every 8 (eight) hours as needed for mild pain.   Yes [provider]  amLODipine (NORVASC) 10 MG tablet Take 1 tablet by mouth once daily for blood pressure. 04/01/17  Yes [name], NP  buPROPion (WELLBUTRIN XL) 300 MG 24 hr tablet Take 300 mg by mouth daily.   Yes [provider]  cyclobenzaprine (FLEXERIL) 5 MG tablet Take 1 tablet (5 mg total) by mouth 2 (two) times daily as needed for muscle spasms. 12/17/16  Yes Alphonzo Grieve, MD  dibucaine (NUPERCAINAL) 1 % OINT Place 1 application rectally as needed for hemorrhoids. 05/16/17  Yes Quintella Reichert, MD  gabapentin (NEURONTIN) 300 MG capsule Take 1 capsule (300 mg total) by mouth 3 (three) times daily. Patient taking differently: Take 300 mg by mouth 3 (three) times daily as needed (pain).  12/24/16 12/24/17 Yes Norman Herrlich, MD  hydrochlorothiazide (HYDRODIURIL) 25 MG tablet Take 1 tablet (25 mg total) by mouth daily. 05/18/16  Yes Burns, Arloa Koh, MD  levothyroxine (SYNTHROID) 88 MCG tablet Take 1 tablet by mouth every morning  on an empty stomach with a full glass of water. 04/01/17 04/01/18 Yes [name], NP  lidocaine (XYLOCAINE) 2 % jelly Apply 1 application topically daily as needed (pain).   Yes [provider]  lisinopril (PRINIVIL,ZESTRIL) 30 MG tablet Take 1 tablet (30 mg total) by mouth daily. 02/13/17  Yes Zada Finders, MD  PARoxetine (PAXIL) 20 MG tablet Take 1.5 tablets (30 mg total) by mouth daily. 07/13/16  Yes Zada Finders, MD  promethazine (PHENERGAN) 12.5 MG tablet Take 1 tablet (12.5 mg total) by mouth every 8 (eight) hours as needed for nausea or vomiting. 05/18/17  Yes [name], NP  traZODone (DESYREL) 100 MG tablet Take 100 mg by mouth at bedtime.   Yes [provider]    Physical Exam: Vitals:   05/19/17 0530 05/19/17 0621 05/19/17 0700 05/19/17 0815  BP:  122/71 129/72 124/74 116/75  Pulse: 72 80 80 79  Resp: 16 20 17 15   Temp:      TempSrc:      SpO2: 97% 100% 99% 99%     General:  Appears calm and comfortable Eyes:  PERRL, EOMI, normal lids, iris ENT:  grossly normal hearing, lips & tongue, Mucous membranes of her mouth are pink but quite dry Neck:  no LAD, masses or thyromegaly Cardiovascular:  RRR, no m/r/g. No LE edema.  Respiratory:  CTA bilaterally, no w/r/r. Normal respiratory effort. Abdomen:  soft, obese but sluggish bowel sounds. Nontender to palpation no guarding or rebounding Skin:  no rash or induration seen on limited exam Musculoskeletal:  grossly normal tone BUE/BLE, good ROM, no bony abnormality Psychiatric:  grossly normal mood and affect, speech fluent and appropriate, AOx3 Neurologic:  CN 2-12 grossly intact, moves all extremities in coordinated fashion, sensation intact  Labs on Admission: I have personally reviewed following labs and imaging studies  CBC:  Recent Labs Lab 05/18/17 1242 05/19/17 0406  WBC 10.4  --   NEUTROABS 8.6*  --   HGB 13.4 13.9  HCT 40.3 41.0  MCV 92.2  --   PLT 290.0  --    Basic Metabolic  Panel:  Recent Labs Lab 05/18/17 1242 05/19/17 0406  NA 137 140  K 4.1 4.2  CL 109 112*  CO2 19  --   GLUCOSE 99 102*  BUN 34* 38*  CREATININE 2.00* 2.60*  CALCIUM 9.3  --    GFR: Estimated Creatinine Clearance: 30.3 mL/min (A) (by C-G formula based on SCr of 2.6 mg/dL (H)). Liver Function Tests:  Recent Labs Lab 05/18/17 1242  AST 19  ALT 23  ALKPHOS 106  BILITOT 0.4  PROT 8.0  ALBUMIN 4.1   No results for input(s): LIPASE, AMYLASE in the last 168 hours. No results for input(s): AMMONIA in the last 168 hours. Coagulation Profile: No results for input(s): INR, PROTIME in the last 168 hours. Cardiac Enzymes: No results for input(s): CKTOTAL, CKMB, CKMBINDEX, TROPONINI in the last 168 hours. BNP (last 3 results) No results for input(s): PROBNP in the last 8760 hours. HbA1C: No results for input(s): HGBA1C in the last 72 hours. CBG: No results for input(s): GLUCAP in the last 168 hours. Lipid Profile: No results for input(s): CHOL, HDL, LDLCALC, TRIG, CHOLHDL, LDLDIRECT in the last 72 hours. Thyroid Function Tests: No results for input(s): TSH, T4TOTAL, FREET4, T3FREE, THYROIDAB in the last 72 hours. Anemia Panel: No results for input(s): VITAMINB12, FOLATE, FERRITIN, TIBC, IRON, RETICCTPCT in the last 72 hours. Urine analysis:    Component Value Date/Time   COLORURINE YELLOW 05/18/2017 1953   APPEARANCEUR HAZY (A) 05/18/2017 1953   APPEARANCEUR Clear 12/17/2016 1132   LABSPEC 1.015 05/18/2017 1953   PHURINE 5.0 05/18/2017 1953   GLUCOSEU NEGATIVE 05/18/2017 1953   GLUCOSEU NEG mg/dL 02/15/2009 1316   HGBUR SMALL (A) 05/18/2017 1953   BILIRUBINUR NEGATIVE 05/18/2017 1953   BILIRUBINUR negative 04/23/2017 1500   BILIRUBINUR Negative 12/17/2016 1132   KETONESUR NEGATIVE 05/18/2017 1953   PROTEINUR 30 (A) 05/18/2017 1953   UROBILINOGEN 0.2 04/23/2017 1500   UROBILINOGEN 1.0 05/10/2013 1642   NITRITE NEGATIVE 05/18/2017 1953   LEUKOCYTESUR TRACE (A)  05/18/2017 1953   LEUKOCYTESUR Negative 12/17/2016 1132    Creatinine Clearance: Estimated Creatinine Clearance: 30.3 mL/min (A) (by C-G formula based on SCr of 2.6 mg/dL (H)).  Sepsis Labs: @LABRCNTIP (procalcitonin:4,lacticidven:4) )No results found for this or any previous  visit (from the past 240 hour(s)).   Radiological Exams on Admission: No results found.  EKG:   Assessment/Plan Principal Problem:   Acute kidney injury ([place]) Active Problems:   Hypothyroidism   OBESITY   ANEMIA, IRON DEFICIENCY, history of   HTN (hypertension)   Venous (peripheral) insufficiency   Chronic low back pain   BPPV (benign paroxysmal positional vertigo)   Diarrhea   Nausea   #1. Acute kidney injury. Likely related to dehydration in the setting of persistent nausea vomiting and diarrhea. Admitting 2.6 on admission. Chart review indicates creatinine 1.2 1.32 months ago. Review indicates lisinopril recently increased. -Admit -Continue vigorous IV fluids -Hold nephrotoxins -Monitor urine output -Recheck in the morning  #2. persistant nausea vomiting diarrhea/gastroenteritis etiology unclear. Some concern for C. difficile as she was recently on antibiotics. He is afebrile no leukocytosis nontoxic appearing -GI pathogen panel -Controlled Zofran -Clear liquids -Monitor  #3. Hypertension. Home medications include hydrochlorothiazide and lisinopril as well as Norvasc. Blood pressure on the low end of normal on admission. -Hold antihypertensives. -Planned resume Norvasc when indicated  #4. Hypothyroidism. TSH last week 2.76 -Continue home meds  #5. The BPPV. Stable at baseline   DVT prophylaxis: lovnox Code Status: full  Family Communication: none present  Disposition Plan: home  Consults called: none  Admission status: obs    [name] M MD [facility]  If 7PM-7AM, please contact night-coverage [email or web address] Password [facility]  05/19/2017, 9:14 AM

## 2017-05-19 NOTE — ED Notes (Signed)
The pt was moved from  The regular to be held for  A regular bed.  She has had 6 days of vomiting and diarrhea.  She last vomited last pm  She has been holding for a bed since then.  No abd pain alert oriented skin warm and dry  Waiting on a liquid diet tray  Iv infusing at 1ml/hr

## 2017-05-19 NOTE — Telephone Encounter (Signed)
Try [facility]? They should be able to do hemorrhoid removal.

## 2017-05-19 NOTE — Telephone Encounter (Signed)
Noted, referral placed. Patient currently admitted to [facility].

## 2017-05-19 NOTE — ED Notes (Signed)
Attempted report x1.  Name and callback number provided.   

## 2017-05-19 NOTE — ED Provider Notes (Signed)
[place] DEPT Provider Note   CSN: [id number] Arrival date & time: 05/18/17  1901     History   Chief Complaint Chief Complaint  Patient presents with  . Abnormal Lab    HPI [name] is a 56 y.o. female.  Patient presents on the recommendation of her PCP whom she saw yesterday for evaluation of nausea, vomiting and diarrhea she has had for the past 5 days. No fever. She states she is unable to eat or drink as usual but only vomits 3/day on average. She is having "too numerous to count" episodes of diarrhea. Both emesis and diarrhea are non-bloody. She denies abdominal pain. She reports increased fatigability, and lightheadedness without syncope when she stands up. No falls. Her PCP performed lab studies yesterday and called the patient to let her know her creatinine was elevated, GFR had dropped, and that she needed to be seen in the emergency department.    The history is provided by the patient. No language interpreter was used.    Past Medical History:  Diagnosis Date  . Allergic rhinitis   . Anemia, iron deficiency   . Anginal pain [facility])    last cardiologist visit 2010 - dx/ed with anxiety instead of cardiac pain  . Arthritis   . Atypical chest pain    Negative cardiolite at [facility] cards 11/07  . CHOLECYSTECTOMY, HX OF    Annotation: 2000 Qualifier: Diagnosis of  By: Eddie Dibbles MD, Bhakti    . Elevated BP   . History of blood transfusion 03/2008   Requried 2u prbc; menometrorrhagia, uterine fibroids.  . Hypertension   . Hypertriglyceridemia    164 - 2/11  . Hypothyroidism   . Insomnia   . Obesity    250 lbs 8/11  . Panic disorder 11/05  . Perirectal abscess 11/14/2011  . Shortness of breath 05/18/2012   w/exertion"    Patient Active Problem List   Diagnosis Date Noted  . BPPV (benign paroxysmal positional vertigo) 01/07/2017  . Urinary incontinence without sensory awareness 12/17/2016  . Injury of left rotator cuff 12/02/2016  . Acute myofascial strain of  lumbosacral region 08/20/2016  . Venous (peripheral) insufficiency 11/08/2015  . Chronic low back pain 11/08/2015  . HTN (hypertension) 02/09/2014  . Preventative health care 02/09/2014  . 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 Metabolic syndrome 96/78/9381  . Pectus excavatum 09/22/2012  . Hepatic steatosis 09/22/2012  . Chronic left maxillary sinus disease 09/22/2012  . Prediabetes 09/22/2012  . Family history of leukemia 09/22/2012  . Proteinuria 07/17/2012  . INSOMNIA UNSPECIFIED 05/01/2010  . Hypothyroidism 04/05/2008  . ANEMIA, IRON DEFICIENCY, history of 04/05/2008  . Hypertriglyceridemia 08/31/2006  . OBESITY 08/31/2006  . PANIC DISORDER 08/31/2006  . CHOLECYSTECTOMY, HX OF 08/31/2006    Past Surgical History:  Procedure Laterality Date  . ABDOMINAL HYSTERECTOMY  04/2007   total abdominal hysterectomy ( cervix) with bilateral salpingo-oopherectomy  . CHOLECYSTECTOMY  07/30/1999   Diagnosis of  By: Eddie Dibbles MD, Bhakti    . DILATION AND CURETTAGE OF UTERUS  1987  . INCISION AND DRAINAGE ABSCESS  11/02/2012   Procedure: INCISION AND DRAINAGE ABSCESS;  Surgeon: Odis Hollingshead, MD;  Location: [place];  Service: General;  Laterality: N/A;  I&D anorectal abscess  . INCISION AND DRAINAGE PERIRECTAL ABSCESS  11/14/2011   Procedure: IRRIGATION AND DEBRIDEMENT PERIRECTAL ABSCESS;  Surgeon: Zenovia Jarred, MD;  Location: [place];  Service: General;  Laterality: N/A;  . NERVE, TENDON AND ARTERY REPAIR Left 06/11/2013  Procedure: EXPLORATION AND REPAIR LEFT FOREARM;  Surgeon: Schuyler Amor, MD;  Location: [place];  Service: Orthopedics;  Laterality: Left;  . SHOULDER ARTHROSCOPY WITH ROTATOR CUFF REPAIR AND SUBACROMIAL DECOMPRESSION Left 03/06/2016   Procedure: LEFT SHOULDER ARTHROSCOPY WITH ROTATOR CUFF REPAIR AND SUBACROMIAL DECOMPRESSION, DISTAL CLAVICLE EXCISION;  Surgeon: Leandrew Koyanagi, MD;  Location: [place];  Service: Orthopedics;  Laterality: Left;    OB History    No data available         Home Medications    Prior to Admission medications   Medication Sig Start Date End Date Taking? Authorizing Provider  acetaminophen (TYLENOL) 500 MG tablet Take 1,000 mg by mouth every 8 (eight) hours as needed for mild pain.    [provider]  amLODipine (NORVASC) 10 MG tablet Take 1 tablet by mouth once daily for blood pressure. 04/01/17   [name], NP  buPROPion (WELLBUTRIN XL) 300 MG 24 hr tablet Take 300 mg by mouth daily.    [provider]  cyclobenzaprine (FLEXERIL) 5 MG tablet Take 1 tablet (5 mg total) by mouth 2 (two) times daily as needed for muscle spasms. 12/17/16   Alphonzo Grieve, MD  dibucaine (NUPERCAINAL) 1 % OINT Place 1 application rectally as needed for hemorrhoids. 05/16/17   Quintella Reichert, MD  Elastic Bandages & Supports ([place]) [place] 1 each by Does not apply route as directed. 11/08/15   Lucious Groves, DO  gabapentin (NEURONTIN) 300 MG capsule Take 1 capsule (300 mg total) by mouth 3 (three) times daily. 12/24/16 12/24/17  Norman Herrlich, MD  hydrochlorothiazide (HYDRODIURIL) 25 MG tablet Take 1 tablet (25 mg total) by mouth daily. 05/18/16   Burns, Arloa Koh, MD  levothyroxine (SYNTHROID) 88 MCG tablet Take 1 tablet by mouth every morning on an empty stomach with a full glass of water. 04/01/17 04/01/18  [name], NP  lisinopril (PRINIVIL,ZESTRIL) 30 MG tablet Take 1 tablet (30 mg total) by mouth daily. 02/13/17   Zada Finders, MD  PARoxetine (PAXIL) 20 MG tablet Take 1.5 tablets (30 mg total) by mouth daily. 07/13/16   Zada Finders, MD  promethazine (PHENERGAN) 12.5 MG tablet Take 1 tablet (12.5 mg total) by mouth every 8 (eight) hours as needed for nausea or vomiting. 05/18/17   [name], NP  traZODone (DESYREL) 100 MG tablet Take 100 mg by mouth at bedtime.    [provider]    Family History Family History  Problem Relation Age of Onset  . Heart attack Mother        at the age of 55's   . Lung cancer Mother        metestatic, + smoker  . Aneurysm Mother   . Hyperlipidemia Mother   . Hypertension Mother   . Hypertension Father   . Heart attack Maternal Grandmother        at the age of 30's    Social History Social History  Substance Use Topics  . Smoking status: Never Smoker  . Smokeless tobacco: Never Used  . Alcohol use No     Allergies   Cephalexin; Doxycycline; Dilaudid [hydromorphone hcl]; and Sulfur   Review of Systems Review of Systems  Constitutional: Negative for chills and fever.  Respiratory: Negative.  Negative for cough and shortness of breath.   Cardiovascular: Negative.  Negative for chest pain.  Gastrointestinal: Positive for diarrhea, nausea and vomiting. Negative for abdominal pain and blood in stool.  Musculoskeletal: Negative.  Negative for myalgias.  Skin: Negative.   Neurological: Positive for light-headedness.     Physical Exam Updated Vital Signs BP 131/75 (BP Location: Right Arm)   Pulse 78   Temp 98 F (36.7 C) (Oral)   Resp 18   LMP 06/17/2007   SpO2 98%   Physical Exam  Constitutional: She is oriented to person, place, and time. She appears well-developed and well-nourished.  HENT:  Head: Normocephalic.  Mouth/Throat: Mucous membranes are dry.  Neck: Normal range of motion. Neck supple.  Cardiovascular: Normal rate and regular rhythm.   Pulmonary/Chest: Effort normal and breath sounds normal.  Abdominal: Soft. Bowel sounds are normal. There is no tenderness. There is no rebound and no guarding.  Musculoskeletal: Normal range of motion.  Neurological: She is alert and oriented to person, place, and time.  Skin: Skin is warm and dry.  Psychiatric: She has a normal mood and affect.     ED Treatments / Results  Labs (all labs ordered are listed, but only abnormal results are displayed) Labs Reviewed  URINALYSIS, ROUTINE W REFLEX MICROSCOPIC - Abnormal; Notable for the following:       Result Value    APPearance HAZY (*)    Hgb urine dipstick SMALL (*)    Protein, ur 30 (*)    Leukocytes, UA TRACE (*)    Bacteria, UA RARE (*)    Squamous Epithelial / LPF 6-30 (*)    All other components within normal limits  I-STAT CHEM 8, ED    EKG  EKG Interpretation None       Radiology No results found.  Procedures Procedures (including critical care time)  Medications Ordered in ED Medications  sodium chloride 0.9 % bolus 2,000 mL (not administered)     Initial Impression / Assessment and Plan / ED Course  I have reviewed the triage vital signs and the nursing notes.  Pertinent labs & imaging results that were available during my care of the patient were reviewed by me and considered in my medical decision making (see chart for details).     Patient presents with N, V, D x 5 days, decreased PO intake and symptoms of lightheadedness without syncope. Concern for dehydration. Called by her doctor with abnormal lab result of elevated creatinine and BUN.   Suspect dehydration as main factor of high Cr. Will give 2 L fluids, IV, and verify lab with recheck.   Final Clinical Impressions(s) / ED Diagnoses   Final diagnoses:  None    New Prescriptions New Prescriptions   No medications on file     Dennie Bible 05/23/17 1165    Ward, Delice Bison, DO 05/29/17 2328

## 2017-05-19 NOTE — ED Notes (Signed)
Kitchen contacted about pt's missing dinner tray.

## 2017-05-19 NOTE — ED Notes (Addendum)
Family at bedside; patient asleep; no needs. Fluids continue to run in.

## 2017-05-19 NOTE — ED Notes (Addendum)
During downtime patient given 4mg  IV zofran at 1538 and water. Endorses mild nausea denies pain. Report given to next nurse [facility]

## 2017-05-19 NOTE — ED Notes (Signed)
Pt ambulated to restroom without distress.  

## 2017-05-19 NOTE — ED Notes (Signed)
The pts bp is in the high 70s  X 2  No previous history of vitals that low  150 ml nss fluid bolus started

## 2017-05-19 NOTE — Telephone Encounter (Signed)
[name] I spoke with Estanislado Spire @ [facility] [phone]  They will take [facility] discount.  They need referral  Below info is for me   I need to call [phone] to schedule appointment they are on [facility] 2 floor. I spoke with pt she is in hospital right now and will call when she gets out of hospital

## 2017-05-19 NOTE — ED Notes (Signed)
5W notified that pt's dinner tray will be sent up there.

## 2017-05-19 NOTE — Telephone Encounter (Signed)
Paige Hail Do you have any idea who I can call about this.  I called  surgical and that told me this was for [place] so they would not be able to see her

## 2017-05-19 NOTE — Telephone Encounter (Signed)
Spoke to [place] @ [facility].  She had a nurse look at er notes/  Pt needs Psychologist, sport and exercise.  Their gi's could not to this.  I called Alycia Rossetti @ [facility]  She is going to see who she can find and give me a call back

## 2017-05-20 DIAGNOSIS — I10 Essential (primary) hypertension: Secondary | ICD-10-CM

## 2017-05-20 DIAGNOSIS — D509 Iron deficiency anemia, unspecified: Secondary | ICD-10-CM

## 2017-05-20 DIAGNOSIS — E039 Hypothyroidism, unspecified: Secondary | ICD-10-CM

## 2017-05-20 DIAGNOSIS — R197 Diarrhea, unspecified: Secondary | ICD-10-CM

## 2017-05-20 DIAGNOSIS — R112 Nausea with vomiting, unspecified: Secondary | ICD-10-CM

## 2017-05-20 DIAGNOSIS — N179 Acute kidney failure, unspecified: Secondary | ICD-10-CM

## 2017-05-20 LAB — BASIC METABOLIC PANEL
ANION GAP: 9 (ref 5–15)
BUN: 21 mg/dL — ABNORMAL HIGH (ref 6–20)
CO2: 19 mmol/L — ABNORMAL LOW (ref 22–32)
Calcium: 8.2 mg/dL — ABNORMAL LOW (ref 8.9–10.3)
Chloride: 115 mmol/L — ABNORMAL HIGH (ref 101–111)
Creatinine, Ser: 1.66 mg/dL — ABNORMAL HIGH (ref 0.44–1.00)
GFR, EST AFRICAN AMERICAN: 40 mL/min — AB (ref >60)
GFR, EST NON AFRICAN AMERICAN: 34 mL/min — AB (ref >60)
Glucose, Bld: 89 mg/dL (ref 65–99)
POTASSIUM: 4 mmol/L (ref 3.5–5.1)
SODIUM: 143 mmol/L (ref 135–145)

## 2017-05-20 LAB — GASTROINTESTINAL PANEL BY PCR, STOOL (REPLACES STOOL CULTURE)
ASTROVIRUS: NOT DETECTED
Adenovirus F40/41: NOT DETECTED
CAMPYLOBACTER SPECIES: DETECTED — AB
CYCLOSPORA CAYETANENSIS: NOT DETECTED
Cryptosporidium: NOT DETECTED
ENTEROTOXIGENIC E COLI (ETEC): NOT DETECTED
Entamoeba histolytica: NOT DETECTED
Enteroaggregative E coli (EAEC): NOT DETECTED
Enteropathogenic E coli (EPEC): NOT DETECTED
Giardia lamblia: NOT DETECTED
NOROVIRUS GI/GII: NOT DETECTED
PLESIMONAS SHIGELLOIDES: NOT DETECTED
Rotavirus A: NOT DETECTED
SAPOVIRUS (I, II, IV, AND V): NOT DETECTED
SHIGA LIKE TOXIN PRODUCING E COLI (STEC): NOT DETECTED
Salmonella species: NOT DETECTED
Shigella/Enteroinvasive E coli (EIEC): NOT DETECTED
Vibrio cholerae: NOT DETECTED
Vibrio species: NOT DETECTED
Yersinia enterocolitica: NOT DETECTED

## 2017-05-20 LAB — CBC
HEMATOCRIT: 35.2 % — AB (ref 36.0–46.0)
HEMOGLOBIN: 11.6 g/dL — AB (ref 12.0–15.0)
MCH: 29.9 pg (ref 26.0–34.0)
MCHC: 33 g/dL (ref 30.0–36.0)
MCV: 90.7 fL (ref 78.0–100.0)
Platelets: 251 10*3/uL (ref 150–400)
RBC: 3.88 MIL/uL (ref 3.87–5.11)
RDW: 14.3 % (ref 11.5–15.5)
WBC: 7.3 10*3/uL (ref 4.0–10.5)

## 2017-05-20 LAB — MRSA PCR SCREENING: MRSA by PCR: NEGATIVE

## 2017-05-20 MED ORDER — SODIUM CHLORIDE 0.9 % IV SOLN
INTRAVENOUS | Status: DC
  Administered 2017-05-20: 09:00:00 via INTRAVENOUS

## 2017-05-20 NOTE — Progress Notes (Signed)
5W39 Word mrn# [id number] WAS A DISCHARGE PATIENT OF [facility] CALLED AND STATED PT WAS POSITIVE FOR CAMMPYLOBATER IN GI PCR. PASSED THIS INFORMATION TO ON CALL SCHORR TO PASSED THIS INFORMATION TO DR MIKHAIL IN AM. DAY CHARGE RN IS STILL HERE HEARD THE RESULTS AND SHE WILL PASS IT ON TO MD IN AM AS WELL.

## 2017-05-20 NOTE — Discharge Instructions (Signed)
Acute Kidney Injury, Adult Acute kidney injury is a sudden worsening of kidney function. The kidneys are organs that have several jobs. They filter the blood to remove waste products and extra fluid. They also maintain a healthy balance of minerals and hormones in the body, which helps control blood pressure and keep bones strong. With this condition, your kidneys do not do their jobs as well as they should. This condition ranges from mild to severe. Over time it may develop into long-lasting (chronic) kidney disease. Early detection and treatment may prevent acute kidney injury from developing into a chronic condition. What are the causes? Common causes of this condition include:  A problem with blood flow to the kidneys. This may be caused by:  Low blood pressure (hypotension) or shock.  Blood loss.  Heart and blood vessel (cardiovascular) disease.  Severe burns.  Liver disease.  Direct damage to the kidneys. This may be caused by:  Certain medicines.  A kidney infection.  Poisoning.  Being around or in contact with toxic substances.  A surgical wound.  A hard, direct hit to the kidney area.  A sudden blockage of urine flow. This may be caused by:  Cancer.  Kidney stones.  An enlarged prostate in males. What are the signs or symptoms? Symptoms of this condition may not be obvious until the condition becomes severe. Symptoms of this condition can include:  Tiredness (lethargy), or difficulty staying awake.  Nausea or vomiting.  Swelling (edema) of the face, legs, ankles, or feet.  Problems with urination, such as:  Abdominal pain, or pain along the side of your stomach (flank).  Decreased urine production.  Decrease in the force of urine flow.  Muscle twitches and cramps, especially in the legs.  Confusion or trouble concentrating.  Loss of appetite.  Fever. How is this diagnosed? This condition may be diagnosed with tests, including:  Blood  tests.  Urine tests.  Imaging tests.  A test in which a sample of tissue is removed from the kidneys to be examined under a microscope (kidney biopsy). How is this treated? Treatment for this condition depends on the cause and how severe the condition is. In mild cases, treatment may not be needed. The kidneys may heal on their own. In more severe cases, treatment will involve:  Treating the cause of the kidney injury. This may involve changing any medicines you are taking or adjusting your dosage.  Fluids. You may need specialized IV fluids to balance your body's needs.  Having a catheter placed to drain urine and prevent blockages.  Preventing problems from occurring. This may mean avoiding certain medicines or procedures that can cause further injury to the kidneys. In some cases treatment may also require:  A procedure to remove toxic wastes from the body (dialysis or continuous renal replacement therapy - CRRT).  Surgery. This may be done to repair a torn kidney, or to remove the blockage from the urinary system. Follow these instructions at home: Medicines   Take over-the-counter and prescription medicines only as told by your health care provider.  Do not take any new medicines without your health care provider's approval. Many medicines can worsen your kidney damage.  Do not take any vitamin and mineral supplements without your health care provider's approval. Many nutritional supplements can worsen your kidney damage. Lifestyle   If your health care provider prescribed changes to your diet, follow them. You may need to decrease the amount of protein you eat.  Achieve and maintain a   healthy weight. If you need help with this, ask your health care provider.  Start or continue an exercise plan. Try to exercise at least 30 minutes a day, 5 days a week.  Do not use any tobacco products, such as cigarettes, chewing tobacco, and e-cigarettes. If you need help quitting, ask  your health care provider. General instructions   Keep track of your blood pressure. Report changes in your blood pressure as told by your health care provider.  Stay up to date with immunizations. Ask your health care provider which immunizations you need.  Keep all follow-up visits as told by your health care provider. This is important. Where to find more information:  American Association of Kidney Patients: www.aakp.org  National Kidney Foundation: www.kidney.org  American Kid[employer] Fund: www.akfinc.org  [employer] [facility]:  www.lifeoptions.org  www.kidneyschool.org Contact a health care provider if:  Your symptoms get worse.  You develop new symptoms. Get help right away if:  You develop symptoms of worsening kidney disease, which include:  Headaches.  Abnormally dark or light skin.  Easy bruising.  Frequent hiccups.  Chest pain.  Shortness of breath.  End of menstruation in women.  Seizures.  Confusion or altered mental status.  Abdominal or back pain.  Itchiness.  You have a fever.  Your body is producing less urine.  You have pain or bleeding when you urinate. Summary  Acute kidney injury is a sudden worsening of kidney function.  Acute kidney injury can be caused by problems with blood flow to the kidneys, direct damage to the kidneys, and sudden blockage of urine flow.  Symptoms of this condition may not be obvious until it becomes severe. Symptoms may include edema, lethargy, confusion, nausea or vomiting, and problems passing urine.  This condition can usually be diagnosed with blood tests, urine tests, and imaging tests. Sometimes a kidney biopsy is done to diagnose this condition.  Treatment for this condition often involves treating the underlying cause. It is treated with fluids, medicines, dialysis, diet changes, or surgery. This information is not intended to replace advice given to you by your health care provider.  Make sure you discuss any questions you have with your health care provider. Document Released: 04/13/2011 Document Revised: 09/18/2016 Document Reviewed: 09/18/2016 Elsevier Interactive Patient Education  2017 [employer]  

## 2017-05-20 NOTE — Progress Notes (Signed)
Patient was discharged home by MD order; discharged instructions review and give to patient with care notes; IV DIC;  patient will be escorted to the car by nurse tech via wheelchair.  

## 2017-05-20 NOTE — Discharge Summary (Signed)
Physician Discharge Summary  [name] KVQ:[id number] DOB: 06-Dec-1960 DOA: 05/19/2017  PCP: [name], NP  Admit date: 05/19/2017 Discharge date: 05/20/2017  Time spent: 45 minutes  Recommendations for Outpatient Follow-up:  Patient will be discharged to home.  Patient will need to follow up with primary care provider within one week of discharge, repeat BMP.  Patient should continue medications as prescribed.  Patient should follow a heart healthy diet.   Discharge Diagnoses:  Acute kidney injury on chronic disease, stage III Persistent nausea, vomiting, diarrhea, likely gastroenteritis Essential hypertension  Hypothyroidism Depression  Discharge Condition: Stable  Diet recommendation: Heart healthy  There were no vitals filed for this visit.  History of present illness:  On 05/19/2017 by Ms. [name], NP [name] is a 56 y.o. female with medical history significant for obesity, cholecystectomy, hypothyroidism, hypertension, anemia, panic disorder presents to the emergency department with the chief complaint persistent nausea vomiting diarrhea. Initial evaluation reveals dehydration and acute kidney injury.  Information is obtained from the chart and the patient. She complains of five-day history persistent nausea vomiting diarrhea. She states she saw primary care provider yesterday she was given Phenergan. She states she's had at least 3 episodes of emesis on average per day. She denies any coffee ground emesis. She also states she's had watery stools more than she can count. She denies any melena or bright red blood per rectum. She's been unable to keep any food or water down. She also reports generalized weakness some lightheadedness. She denies headache chest pain palpitations shortness of breath. She denies syncope or near-syncope. She reports taking Cipro for urinary tract infection 2-3 weeks ago. She took it for 5 days and completed the course. She denies any  dysuria hematuria frequency or urgency.  Hospital Course:  Acute kidney injury on chronic kidney disease, stage III -Likely secondary to dehydration from persistent nausea vomiting and diarrhea -Creatinine upon admission was 2.6, currently 1.6 -Baseline creatinine approximately 1.3 -Patient was placed on IV fluids -Repeat BMP in 1 week  Persistent nausea, vomiting, diarrhea, likely gastroenteritis -C. difficile was negative -Patient currently afebrile with no leukocytosis -GI pathogen panel pending, however most of these pathogens are self-contained and require supportive care -Patient was placed on soft diet and was able to tolerate well without further nausea and vomiting  Essential hypertension -Continue Norvasc -Lisinopril, HCTZ currently held due to acute kidney injury -Suspect patient can restart these medications on discharge with close follow-up with her primary care physician as well as adequate oral intake  Hypothyroidism -Continue Synthroid  Depression -Continue bupropion, Paxil  Procedures: None  Consultations: None  Discharge Exam: Vitals:   05/19/17 2022 05/20/17 0502  BP: 138/82 128/68  Pulse: 73 74  Resp: 16 16  Temp: 98 F (36.7 C) 98.3 F (36.8 C)  SpO2: 100% 99%   Patient feeling improved today. Was able to tolerate a soft diet. Denies any further nausea or vomiting but has had some diarrhea. Would like to go home today. Currently denies chest pain, shortness of breath, dizziness or headache.   General: Well developed, well nourished, NAD, appears stated age  HEENT: NCAT, mucous membranes moist.  Cardiovascular: S1 S2 auscultated, no rubs, murmurs or gallops. Regular rate and rhythm.  Respiratory: Clear to auscultation bilaterally with equal chest rise  Abdomen: Soft, nontender, nondistended, + bowel sounds  Extremities: warm dry without cyanosis clubbing or edema  Neuro: AAOx3, nonfocal  Psych: Normal affect and demeanor with intact  judgement and insight,  pleasant  Discharge Instructions Discharge Instructions    Discharge instructions    Complete by:  As directed    Patient will be discharged to home.  Patient will need to follow up with primary care provider within one week of discharge, repeat BMP.  Patient should continue medications as prescribed.  Patient should follow a heart healthy diet.     Current Discharge Medication List    CONTINUE these medications which have NOT CHANGED   Details  acetaminophen (TYLENOL) 500 MG tablet Take 1,000 mg by mouth every 8 (eight) hours as needed for mild pain.    amLODipine (NORVASC) 10 MG tablet Take 1 tablet by mouth once daily for blood pressure. Qty: 30 tablet, Refills: 0   Associated Diagnoses: Essential hypertension    buPROPion (WELLBUTRIN XL) 300 MG 24 hr tablet Take 300 mg by mouth daily.    cyclobenzaprine (FLEXERIL) 5 MG tablet Take 1 tablet (5 mg total) by mouth 2 (two) times daily as needed for muscle spasms. Qty: 30 tablet, Refills: 0   Associated Diagnoses: Low back pain potentially associated with radiculopathy    dibucaine (NUPERCAINAL) 1 % OINT Place 1 application rectally as needed for hemorrhoids. Qty: 28 g, Refills: 0    gabapentin (NEURONTIN) 300 MG capsule Take 1 capsule (300 mg total) by mouth 3 (three) times daily. Qty: 90 capsule, Refills: 2   Associated Diagnoses: Chronic low back pain without sciatica, unspecified back pain laterality    hydrochlorothiazide (HYDRODIURIL) 25 MG tablet Take 1 tablet (25 mg total) by mouth daily. Qty: 30 tablet, Refills: 11   Associated Diagnoses: Essential hypertension    levothyroxine (SYNTHROID) 88 MCG tablet Take 1 tablet by mouth every morning on an empty stomach with a full glass of water. Qty: 90 tablet, Refills: 0   Associated Diagnoses: Hypothyroidism, unspecified type    lidocaine (XYLOCAINE) 2 % jelly Apply 1 application topically daily as needed (pain).    lisinopril (PRINIVIL,ZESTRIL) 30 MG  tablet Take 1 tablet (30 mg total) by mouth daily. Qty: 90 tablet, Refills: 2    PARoxetine (PAXIL) 20 MG tablet Take 1.5 tablets (30 mg total) by mouth daily. Qty: 135 tablet, Refills: 2   Associated Diagnoses: Panic disorder without agoraphobia    promethazine (PHENERGAN) 12.5 MG tablet Take 1 tablet (12.5 mg total) by mouth every 8 (eight) hours as needed for nausea or vomiting. Qty: 20 tablet, Refills: 0   Associated Diagnoses: Nausea and vomiting, intractability of vomiting not specified, unspecified vomiting type    traZODone (DESYREL) 100 MG tablet Take 100 mg by mouth at bedtime.       Allergies  Allergen Reactions  . Cephalexin Itching  . Doxycycline Nausea And Vomiting  . Dilaudid [Hydromorphone Hcl] Other (See Comments)    Panic attack  . Sulfur Rash    Itching with rash   Follow-up Information    [name], NP. Schedule an appointment as soon as possible for a visit in 1 week(s).   Specialty:  Internal Medicine Why:  Hospital follow up, repeat BMP Contact information: [address] [place] [address] [phone]            The results of significant diagnostics from this hospitalization (including imaging, microbiology, ancillary and laboratory) are listed below for reference.    Significant Diagnostic Studies: No results found.  Microbiology: Recent Results (from the past 240 hour(s))  C difficile quick scan w PCR reflex     Status: None   Collection  Time: 05/19/17 12:41 PM  Result Value Ref Range Status   C Diff antigen NEGATIVE NEGATIVE Final   C Diff toxin NEGATIVE NEGATIVE Final   C Diff interpretation No C. difficile detected.  Final  MRSA PCR Screening     Status: None   Collection Time: 05/20/17  4:36 AM  Result Value Ref Range Status   MRSA by PCR NEGATIVE NEGATIVE Final    Comment:        The GeneXpert MRSA Assay (FDA approved for NASAL specimens only), is one component of a comprehensive MRSA colonization surveillance  program. It is not intended to diagnose MRSA infection nor to guide or monitor treatment for MRSA infections.      Labs: Basic Metabolic Panel:  Recent Labs Lab 05/18/17 1242 05/19/17 0406 05/20/17 0400  NA 137 140 143  K 4.1 4.2 4.0  CL 109 112* 115*  CO2 19  --  19*  GLUCOSE 99 102* 89  BUN 34* 38* 21*  CREATININE 2.00* 2.60* 1.66*  CALCIUM 9.3  --  8.2*   Liver Function Tests:  Recent Labs Lab 05/18/17 1242  AST 19  ALT 23  ALKPHOS 106  BILITOT 0.4  PROT 8.0  ALBUMIN 4.1   No results for input(s): LIPASE, AMYLASE in the last 168 hours. No results for input(s): AMMONIA in the last 168 hours. CBC:  Recent Labs Lab 05/18/17 1242 05/19/17 0406 05/20/17 0400  WBC 10.4  --  7.3  NEUTROABS 8.6*  --   --   HGB 13.4 13.9 11.6*  HCT 40.3 41.0 35.2*  MCV 92.2  --  90.7  PLT 290.0  --  251   Cardiac Enzymes: No results for input(s): CKTOTAL, CKMB, CKMBINDEX, TROPONINI in the last 168 hours. BNP: BNP (last 3 results) No results for input(s): BNP in the last 8760 hours.  ProBNP (last 3 results) No results for input(s): PROBNP in the last 8760 hours.  CBG: No results for input(s): GLUCAP in the last 168 hours.     SignedCristal Ford  [facility] 05/20/2017, 2:35 PM

## 2017-05-21 ENCOUNTER — Telehealth: Payer: Self-pay

## 2017-05-21 ENCOUNTER — Telehealth: Payer: Self-pay | Admitting: *Deleted

## 2017-05-21 NOTE — Telephone Encounter (Signed)
Patient wanting to come in today (8/10) to be seen for her hemorrhoids stated that it was causing her a lot of pain. I told her that we didn't have a provider here in office to her for that today and that she would have to go to the ED if she is having that much pain. I was apologetic to the patient and she verbalized understanding.

## 2017-05-21 NOTE — Telephone Encounter (Signed)
Transition Care Management Follow-up Telephone Call   Date discharged? 05/20/2017    How have you been since you were released from the hospital? "ok just a little tired."   Do you understand why you were in the hospital? yes   Do you understand the discharge instructions? yes   Where were you discharged to? home   Items Reviewed:  Medications reviewed: yes  Allergies reviewed: yes  Dietary changes reviewed: yes  Referrals reviewed: n/a   Functional Questionnaire:  Activities of Daily Living (ADLs):   She states they are independent in the following: pt states she is independent in all areas   Any transportation issues/concerns?: no   Any patient concerns? no   Confirmed importance and date/time of follow-up visits scheduled yes  Provider Appointment booked with [facility] 08/14  Confirmed with patient if condition begins to worsen call PCP or go to the ER.  Patient was given the office number and encouraged to call back with question or concerns.  : yes

## 2017-05-21 NOTE — Progress Notes (Signed)
GI pathogen panel returned which was positive for Campylobacter.  Called and discussed with patient. States diarrhea has subsided and currently she is having constipation. Discussed hand hygiene with patient and thoroughly cooking foods.  Time spent: 10 minutes  Meegan Shanafelt D.O. [facility] Pager [phone]  If 7PM-7AM, please contact night-coverage [email or web address] Password [email or web address] 05/21/2017, 12:50 PM

## 2017-05-24 ENCOUNTER — Ambulatory Visit: Payer: Self-pay | Admitting: Surgery

## 2017-05-25 ENCOUNTER — Encounter: Payer: Self-pay | Admitting: Primary Care

## 2017-05-25 ENCOUNTER — Ambulatory Visit (INDEPENDENT_AMBULATORY_CARE_PROVIDER_SITE_OTHER): Payer: No Typology Code available for payment source | Admitting: Primary Care

## 2017-05-25 VITALS — BP 116/74 | HR 68 | Temp 98.3°F | Ht 60.5 in | Wt 265.8 lb

## 2017-05-25 DIAGNOSIS — N183 Chronic kidney disease, stage 3 unspecified: Secondary | ICD-10-CM

## 2017-05-25 DIAGNOSIS — N179 Acute kidney failure, unspecified: Secondary | ICD-10-CM

## 2017-05-25 DIAGNOSIS — R1032 Left lower quadrant pain: Secondary | ICD-10-CM

## 2017-05-25 LAB — POC URINALSYSI DIPSTICK (AUTOMATED)
Bilirubin, UA: NEGATIVE
Clarity, UA: NEGATIVE
GLUCOSE UA: NEGATIVE
KETONES UA: NEGATIVE
Leukocytes, UA: NEGATIVE
Nitrite, UA: NEGATIVE
Protein, UA: NEGATIVE
RBC UA: NEGATIVE
UROBILINOGEN UA: 0.2 U/dL
pH, UA: 6 (ref 5.0–8.0)

## 2017-05-25 NOTE — Progress Notes (Signed)
Subjective:    Patient ID: [name], female    DOB: 01/03/1961, 56 y.o.   MRN: [id number]  HPI  [name] is a 56 year old female who presents today for hospital follow up.  She presented to our clinic on 05/18/17 with complaints of nausea, vomiting, diarrhea, abdominal pain. Work up in the office included BMP which showed acute kidney injury with increase in creatine to 2.0 from 1.3 and decrease in GFR to 27.6 from 47.55. She was sent to the emergency department given for acute kidney injury secondary to dehydration.  She presented to [facility] several hours later, was provided with IV fluids and Zofran. She was admitted for further treatment and monitoring of AKI. During her hospital stay she was treated with IV fluids, tested for c-diff (negative), underwent continuous lab monitoring. Her lisinopril and HCTZ were held due to AKI. She was discharged home on 05/20/17 with recommendations to resume BP medications and follow up with PCP.  Since her discharge home she's feeling slightly better. She continues to feel nauseated and has little appetite. She continues to experience left flank pain and LLQ abdominal pain. She's been eating boiled eggs, toast, hot dog; very small meals that are mostly once daily. She's been sipping on water, ginger-ale, and sprite. She's been taking the Phenergan 2-3 times daily but continues to feel nauseated.   She denies fevers, diarrhea, vomiting. She has since resumed her HCTZ and lisinopril.   Review of Systems  Constitutional: Positive for fatigue. Negative for fever.  Gastrointestinal: Positive for abdominal pain and nausea. Negative for blood in stool, constipation, diarrhea and vomiting.  Skin: Negative for color change.       Past Medical History:  Diagnosis Date  . Acute kidney injury ([place]) 05/19/2017  . Acute myofascial strain of lumbosacral region 08/20/2016  . Allergic rhinitis   . Anemia, iron deficiency   . ANEMIA, IRON DEFICIENCY, history  of 04/05/2008   Qualifier: Diagnosis of  By: Philbert Riser MD, Manrique    . Anginal pain [facility])    last cardiologist visit 2010 - dx/ed with anxiety instead of cardiac pain  . Arthritis   . Atypical chest pain    Negative cardiolite at [facility] cards 11/07  . BPPV (benign paroxysmal positional vertigo) 01/07/2017  . CHOLECYSTECTOMY, HX OF    Annotation: 2000 Qualifier: Diagnosis of  By: Eddie Dibbles MD, Bhakti    . Chronic left maxillary sinus disease 09/22/2012  . Chronic low back pain 11/08/2015  . Diarrhea 05/19/2017  . Elevated BP   . Family history of leukemia 09/22/2012   She states that both her uncle and his son was diagnosed with Leukemia. She would like to know more information about the leukemia.       . Hepatic steatosis 09/22/2012   Incidental finding on abd CT in 2011   . History of blood transfusion 03/2008   Requried 2u prbc; menometrorrhagia, uterine fibroids.  Marland Kitchen HTN (hypertension) 02/09/2014  . Hypertension   . Hypertriglyceridemia    164 - 2/11  . Hypothyroidism   . Injury of left rotator cuff 12/02/2016  . Insomnia   . INSOMNIA UNSPECIFIED 05/01/2010   Qualifier: Diagnosis of  By: Shon Baton, MD, Janett Billow    . Metabolic syndrome 27/78/2423  . Nausea 05/19/2017  . Nausea vomiting and diarrhea   . 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 Obesity    250 lbs 8/11  . OBESITY 08/31/2006   Annotation: Wt. 248 lbs. 11/2004 Qualifier: Diagnosis of  By:  Eddie Dibbles MD, Lillia Pauls    . Panic disorder 11/05  . PANIC DISORDER 08/31/2006   Diagnosed 11/05, well-controlled with Paxil, patient also takes albuterol when she has a panic attack, maybe once/month   . Pectus excavatum 09/22/2012   Incidental finding on CXR in 2013.    Marland Kitchen Perirectal abscess 11/14/2011  . Prediabetes 09/22/2012  . Preventative health care 02/09/2014  . Proteinuria 07/17/2012  . Shortness of breath 05/18/2012   w/exertion"  . Urinary incontinence without sensory awareness 12/17/2016  . Venous (peripheral) insufficiency 11/08/2015     Social  History   Social History  . Marital status: Married    Spouse name: N/A  . Number of children: N/A  . Years of education: N/A   Occupational History  . Not on file.   Social History Main Topics  . Smoking status: Never Smoker  . Smokeless tobacco: Never Used  . Alcohol use No  . Drug use: No  . Sexual activity: Not Currently   Other Topics Concern  . Not on file   Social History Narrative   Currently unemployed. Starting college in Aug for accounting.  Married, has 2 children lives with her youngest son.      Past Surgical History:  Procedure Laterality Date  . ABDOMINAL HYSTERECTOMY  04/2007   total abdominal hysterectomy ( cervix) with bilateral salpingo-oopherectomy  . CHOLECYSTECTOMY  07/30/1999   Diagnosis of  By: Eddie Dibbles MD, Bhakti    . DILATION AND CURETTAGE OF UTERUS  1987  . INCISION AND DRAINAGE ABSCESS  11/02/2012   Procedure: INCISION AND DRAINAGE ABSCESS;  Surgeon: Odis Hollingshead, MD;  Location: [place];  Service: General;  Laterality: N/A;  I&D anorectal abscess  . INCISION AND DRAINAGE PERIRECTAL ABSCESS  11/14/2011   Procedure: IRRIGATION AND DEBRIDEMENT PERIRECTAL ABSCESS;  Surgeon: Zenovia Jarred, MD;  Location: [place];  Service: General;  Laterality: N/A;  . NERVE, TENDON AND ARTERY REPAIR Left 06/11/2013   Procedure: EXPLORATION AND REPAIR LEFT FOREARM;  Surgeon: Schuyler Amor, MD;  Location: [place];  Service: Orthopedics;  Laterality: Left;  . SHOULDER ARTHROSCOPY WITH ROTATOR CUFF REPAIR AND SUBACROMIAL DECOMPRESSION Left 03/06/2016   Procedure: LEFT SHOULDER ARTHROSCOPY WITH ROTATOR CUFF REPAIR AND SUBACROMIAL DECOMPRESSION, DISTAL CLAVICLE EXCISION;  Surgeon: Leandrew Koyanagi, MD;  Location: [place];  Service: Orthopedics;  Laterality: Left;    Family History  Problem Relation Age of Onset  . Heart attack Mother        at the age of 77's  . Lung cancer Mother        metestatic, + smoker  . Aneurysm Mother   . Hyperlipidemia Mother   . Hypertension  Mother   . Hypertension Father   . Heart attack Maternal Grandmother        at the age of 76's    Allergies  Allergen Reactions  . Cephalexin Itching  . Doxycycline Nausea And Vomiting  . Dilaudid [Hydromorphone Hcl] Other (See Comments)    Panic attack  . Sulfur Rash    Itching with rash    Current Outpatient Prescriptions on File Prior to Visit  Medication Sig Dispense Refill  . acetaminophen (TYLENOL) 500 MG tablet Take 1,000 mg by mouth every 8 (eight) hours as needed for mild pain.    Marland Kitchen amLODipine (NORVASC) 10 MG tablet Take 1 tablet by mouth once daily for blood pressure. 30 tablet 0  . buPROPion (WELLBUTRIN XL) 300 MG 24 hr tablet Take 300 mg by mouth daily.    Marland Kitchen  cyclobenzaprine (FLEXERIL) 5 MG tablet Take 1 tablet (5 mg total) by mouth 2 (two) times daily as needed for muscle spasms. 30 tablet 0  . dibucaine (NUPERCAINAL) 1 % OINT Place 1 application rectally as needed for hemorrhoids. 28 g 0  . gabapentin (NEURONTIN) 300 MG capsule Take 1 capsule (300 mg total) by mouth 3 (three) times daily. (Patient taking differently: Take 300 mg by mouth 3 (three) times daily as needed (pain). ) 90 capsule 2  . hydrochlorothiazide (HYDRODIURIL) 25 MG tablet Take 1 tablet (25 mg total) by mouth daily. 30 tablet 11  . levothyroxine (SYNTHROID) 88 MCG tablet Take 1 tablet by mouth every morning on an empty stomach with a full glass of water. 90 tablet 0  . lidocaine (XYLOCAINE) 2 % jelly Apply 1 application topically daily as needed (pain).    Marland Kitchen lisinopril (PRINIVIL,ZESTRIL) 30 MG tablet Take 1 tablet (30 mg total) by mouth daily. 90 tablet 2  . PARoxetine (PAXIL) 20 MG tablet Take 1.5 tablets (30 mg total) by mouth daily. 135 tablet 2  . promethazine (PHENERGAN) 12.5 MG tablet Take 1 tablet (12.5 mg total) by mouth every 8 (eight) hours as needed for nausea or vomiting. 20 tablet 0  . traZODone (DESYREL) 100 MG tablet Take 100 mg by mouth at bedtime.     No current facility-administered  medications on file prior to visit.     BP 116/74   Pulse 68   Temp 98.3 F (36.8 C) (Oral)   Ht 5' 0.5" (1.537 m)   Wt 265 lb 12.8 oz (120.6 kg)   LMP 06/17/2007   SpO2 98%   BMI 51.06 kg/m    Objective:   Physical Exam  Constitutional: She appears well-nourished. She does not appear ill.  Neck: Neck supple.  Cardiovascular: Normal rate and regular rhythm.   Pulmonary/Chest: Effort normal and breath sounds normal.  Abdominal: Soft. Normal appearance and bowel sounds are normal. There is tenderness in the suprapubic area, left upper quadrant and left lower quadrant.  Skin: Skin is warm and dry.          Assessment & Plan:  Hospital Follow Up:  Admitted for AKI secondary to acute dehydration from GI illness. Work up in the hospital suggested viral GI involvement. Renal function did improve prior to discharge. Overall patient feeling about the same, no diarrhea or vomiting. Check CBC and BMP today. UA today negative for infection.  The concern is abdominal pain and persistent nausea as she's eating and drinking very little. Worry about proper oral intake to help with renal recovery. Consider acute diverticulitis given location of pain. Would like to get CT scan, however, renal function may not be stable enough. Consider discontinuing HCTZ if renal function dose not improve.  Referral placed to nephrology for further evaluation and management of CKD.  All hospital labs, notes reviewed. Sheral Flow, NP

## 2017-05-25 NOTE — Patient Instructions (Addendum)
Complete lab work prior to leaving today.   Stop by the front desk and speak with either Rosaria Ferries or Shirlean Mylar regarding your referral to nephrology.  Work on Financial planner with water.   Once I get your lab results we can consider a CT scan. I'll be in touch soon!  It was a pleasure to see you today!

## 2017-05-25 NOTE — Assessment & Plan Note (Signed)
Chronic. Recent admission for AKI secondary to dehydration. Check BMP today. Consider stopping HCTZ. Continue ACE for protection. Referral placed to nephrology for further evaluation.

## 2017-05-25 NOTE — Addendum Note (Signed)
Addended by: Jacqualin Combes on: 05/25/2017 05:05 PM   Modules accepted: Orders

## 2017-05-26 ENCOUNTER — Encounter: Payer: Self-pay | Admitting: Primary Care

## 2017-05-26 LAB — CBC WITH DIFFERENTIAL/PLATELET
BASOS ABS: 0.1 10*3/uL (ref 0.0–0.1)
Basophils Relative: 0.8 % (ref 0.0–3.0)
EOS ABS: 0.2 10*3/uL (ref 0.0–0.7)
Eosinophils Relative: 2 % (ref 0.0–5.0)
HEMATOCRIT: 38.9 % (ref 36.0–46.0)
Hemoglobin: 12.8 g/dL (ref 12.0–15.0)
LYMPHS ABS: 1.4 10*3/uL (ref 0.7–4.0)
LYMPHS PCT: 15.2 % (ref 12.0–46.0)
MCHC: 33 g/dL (ref 30.0–36.0)
MCV: 91.8 fl (ref 78.0–100.0)
Monocytes Absolute: 0.6 10*3/uL (ref 0.1–1.0)
Monocytes Relative: 6.6 % (ref 3.0–12.0)
NEUTROS PCT: 75.4 % (ref 43.0–77.0)
Neutro Abs: 7 10*3/uL (ref 1.4–7.7)
PLATELETS: 285 10*3/uL (ref 150.0–400.0)
RBC: 4.24 Mil/uL (ref 3.87–5.11)
RDW: 13.8 % (ref 11.5–15.5)
WBC: 9.2 10*3/uL (ref 4.0–10.5)

## 2017-05-26 LAB — BASIC METABOLIC PANEL
BUN: 19 mg/dL (ref 6–23)
CALCIUM: 9.2 mg/dL (ref 8.4–10.5)
CO2: 25 mEq/L (ref 19–32)
CREATININE: 1.38 mg/dL — AB (ref 0.40–1.20)
Chloride: 106 mEq/L (ref 96–112)
GFR: 42.39 mL/min — AB (ref >60.00)
Glucose, Bld: 83 mg/dL (ref 70–99)
Potassium: 3.8 mEq/L (ref 3.5–5.1)
Sodium: 139 mEq/L (ref 135–145)

## 2017-05-26 NOTE — Telephone Encounter (Signed)
Can you please follow up on her lab results? They should have returned by now.

## 2017-05-27 ENCOUNTER — Encounter: Payer: Self-pay | Admitting: Primary Care

## 2017-06-01 ENCOUNTER — Ambulatory Visit: Payer: Self-pay | Admitting: Surgery

## 2017-06-01 ENCOUNTER — Ambulatory Visit (INDEPENDENT_AMBULATORY_CARE_PROVIDER_SITE_OTHER): Payer: No Typology Code available for payment source | Admitting: Primary Care

## 2017-06-01 ENCOUNTER — Encounter: Payer: Self-pay | Admitting: Primary Care

## 2017-06-01 ENCOUNTER — Other Ambulatory Visit: Payer: Self-pay | Admitting: Primary Care

## 2017-06-01 ENCOUNTER — Ambulatory Visit (INDEPENDENT_AMBULATORY_CARE_PROVIDER_SITE_OTHER)
Admission: RE | Admit: 2017-06-01 | Discharge: 2017-06-01 | Disposition: A | Discharge status: Home / Self Care | Payer: No Typology Code available for payment source | Source: Ambulatory Visit | Attending: Primary Care | Admitting: Primary Care

## 2017-06-01 VITALS — BP 124/64 | HR 67 | Temp 98.5°F | Wt 266.4 lb

## 2017-06-01 DIAGNOSIS — K5792 Diverticulitis of intestine, part unspecified, without perforation or abscess without bleeding: Secondary | ICD-10-CM

## 2017-06-01 DIAGNOSIS — R1032 Left lower quadrant pain: Secondary | ICD-10-CM

## 2017-06-01 MED ORDER — METRONIDAZOLE 500 MG PO TABS: 500.0000 mg | ORAL_TABLET | Freq: Three times a day (TID) | ORAL | 0 refills | Status: DC

## 2017-06-01 MED ORDER — IOPAMIDOL (ISOVUE-300) INJECTION 61%
80.0000 mL | Freq: Once | INTRAVENOUS | Status: AC | PRN
  Administered 2017-06-01: 80 mL via INTRAVENOUS

## 2017-06-01 MED ORDER — CIPROFLOXACIN HCL 250 MG PO TABS: 250.0000 mg | ORAL_TABLET | Freq: Two times a day (BID) | ORAL | 0 refills | Status: AC

## 2017-06-01 NOTE — Patient Instructions (Signed)
Stop by the front desk and speak with either Rosaria Ferries or Shirlean Mylar regarding your CT scan.   I'll be in touch later today once I receive the results.  It was a pleasure to see you today!

## 2017-06-01 NOTE — Progress Notes (Signed)
Subjective:    Patient ID: [name], female    DOB: 10-07-1961, 56 y.o.   MRN: [id number]  HPI  [name] is a 56 year old female who presents today for follow up of nausea, vomiting, diarrhea. She was last evaluated on 05/25/17 after hospitalization for acute kidney injury secondary to dehydration. During her last visit she endorsed continued decrease in appetite and nausea, denied diarrhea and vomiting. Labs last visit showed that kidney function had returned to her baseline range and her CBC was unremarkable.  Since her last visit she's developed a return of her left lower quadrant pain that began 24 hours ago. Her pain is constant but worse with movement and pressure. She's been unable to eat anything since 6 pm Sunday evening. She's only had "sips" of water since last night. She has been able to drink water since her last visit, just limited since yesterday. She's had 1-2 episodes of diarrhea since her last visit. She denies vomiting, bloody stools, fevers. History of diverticulosis on CT scan from 2012, no history of diverticulitis.  Review of Systems  Constitutional: Positive for chills. Negative for fatigue and fever.  Respiratory: Negative for shortness of breath.   Cardiovascular: Negative for chest pain.  Gastrointestinal: Positive for abdominal pain and nausea. Negative for blood in stool, constipation, diarrhea and vomiting.       Past Medical History:  Diagnosis Date  . Acute kidney injury ([place]) 05/19/2017  . Acute myofascial strain of lumbosacral region 08/20/2016  . Allergic rhinitis   . Anemia, iron deficiency   . ANEMIA, IRON DEFICIENCY, history of 04/05/2008   Qualifier: Diagnosis of  By: Philbert Riser MD, Manrique    . Anginal pain [facility])    last cardiologist visit 2010 - dx/ed with anxiety instead of cardiac pain  . Arthritis   . Atypical chest pain    Negative cardiolite at [facility] cards 11/07  . BPPV (benign paroxysmal positional vertigo) 01/07/2017  .  CHOLECYSTECTOMY, HX OF    Annotation: 2000 Qualifier: Diagnosis of  By: Eddie Dibbles MD, Bhakti    . Chronic left maxillary sinus disease 09/22/2012  . Chronic low back pain 11/08/2015  . Diarrhea 05/19/2017  . Elevated BP   . Family history of leukemia 09/22/2012   She states that both her uncle and his son was diagnosed with Leukemia. She would like to know more information about the leukemia.       . Hepatic steatosis 09/22/2012   Incidental finding on abd CT in 2011   . History of blood transfusion 03/2008   Requried 2u prbc; menometrorrhagia, uterine fibroids.  Marland Kitchen HTN (hypertension) 02/09/2014  . Hypertension   . Hypertriglyceridemia    164 - 2/11  . Hypothyroidism   . Injury of left rotator cuff 12/02/2016  . Insomnia   . INSOMNIA UNSPECIFIED 05/01/2010   Qualifier: Diagnosis of  By: Shon Baton, MD, Janett Billow    . Metabolic syndrome 81/85/6314  . Nausea 05/19/2017  . Nausea vomiting and diarrhea   . 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 Obesity    250 lbs 8/11  . OBESITY 08/31/2006   Annotation: Wt. 248 lbs. 11/2004 Qualifier: Diagnosis of  By: Eddie Dibbles MD, Bhakti    . Panic disorder 11/05  . PANIC DISORDER 08/31/2006   Diagnosed 11/05, well-controlled with Paxil, patient also takes albuterol when she has a panic attack, maybe once/month   . Pectus excavatum 09/22/2012   Incidental finding on CXR in 2013.    Marland Kitchen Perirectal abscess 11/14/2011  .  Prediabetes 09/22/2012  . Preventative health care 02/09/2014  . Proteinuria 07/17/2012  . Shortness of breath 05/18/2012   w/exertion"  . Urinary incontinence without sensory awareness 12/17/2016  . Venous (peripheral) insufficiency 11/08/2015     Social History   Social History  . Marital status: Married    Spouse name: N/A  . Number of children: N/A  . Years of education: N/A   Occupational History  . Not on file.   Social History Main Topics  . Smoking status: Never Smoker  . Smokeless tobacco: Never Used  . Alcohol use No  . Drug use: No  . Sexual  activity: Not Currently   Other Topics Concern  . Not on file   Social History Narrative   Currently unemployed. Starting college in Aug for accounting.  Married, has 2 children lives with her youngest son.      Past Surgical History:  Procedure Laterality Date  . ABDOMINAL HYSTERECTOMY  04/2007   total abdominal hysterectomy ( cervix) with bilateral salpingo-oopherectomy  . CHOLECYSTECTOMY  07/30/1999   Diagnosis of  By: Eddie Dibbles MD, Bhakti    . DILATION AND CURETTAGE OF UTERUS  1987  . INCISION AND DRAINAGE ABSCESS  11/02/2012   Procedure: INCISION AND DRAINAGE ABSCESS;  Surgeon: Odis Hollingshead, MD;  Location: [place];  Service: General;  Laterality: N/A;  I&D anorectal abscess  . INCISION AND DRAINAGE PERIRECTAL ABSCESS  11/14/2011   Procedure: IRRIGATION AND DEBRIDEMENT PERIRECTAL ABSCESS;  Surgeon: Zenovia Jarred, MD;  Location: [place];  Service: General;  Laterality: N/A;  . NERVE, TENDON AND ARTERY REPAIR Left 06/11/2013   Procedure: EXPLORATION AND REPAIR LEFT FOREARM;  Surgeon: Schuyler Amor, MD;  Location: [place];  Service: Orthopedics;  Laterality: Left;  . SHOULDER ARTHROSCOPY WITH ROTATOR CUFF REPAIR AND SUBACROMIAL DECOMPRESSION Left 03/06/2016   Procedure: LEFT SHOULDER ARTHROSCOPY WITH ROTATOR CUFF REPAIR AND SUBACROMIAL DECOMPRESSION, DISTAL CLAVICLE EXCISION;  Surgeon: Leandrew Koyanagi, MD;  Location: [place];  Service: Orthopedics;  Laterality: Left;    Family History  Problem Relation Age of Onset  . Heart attack Mother        at the age of 15's  . Lung cancer Mother        metestatic, + smoker  . Aneurysm Mother   . Hyperlipidemia Mother   . Hypertension Mother   . Hypertension Father   . Heart attack Maternal Grandmother        at the age of 47's    Allergies  Allergen Reactions  . Cephalexin Itching  . Doxycycline Nausea And Vomiting  . Dilaudid [Hydromorphone Hcl] Other (See Comments)    Panic attack  . Sulfur Rash    Itching with rash    Current  Outpatient Prescriptions on File Prior to Visit  Medication Sig Dispense Refill  . acetaminophen (TYLENOL) 500 MG tablet Take 1,000 mg by mouth every 8 (eight) hours as needed for mild pain.    Marland Kitchen amLODipine (NORVASC) 10 MG tablet Take 1 tablet by mouth once daily for blood pressure. 30 tablet 0  . buPROPion (WELLBUTRIN XL) 300 MG 24 hr tablet Take 300 mg by mouth daily.    . cyclobenzaprine (FLEXERIL) 5 MG tablet Take 1 tablet (5 mg total) by mouth 2 (two) times daily as needed for muscle spasms. 30 tablet 0  . dibucaine (NUPERCAINAL) 1 % OINT Place 1 application rectally as needed for hemorrhoids. 28 g 0  . gabapentin (NEURONTIN) 300 MG capsule Take 1 capsule (  300 mg total) by mouth 3 (three) times daily. (Patient taking differently: Take 300 mg by mouth 3 (three) times daily as needed (pain). ) 90 capsule 2  . hydrochlorothiazide (HYDRODIURIL) 25 MG tablet Take 1 tablet (25 mg total) by mouth daily. 30 tablet 11  . levothyroxine (SYNTHROID) 88 MCG tablet Take 1 tablet by mouth every morning on an empty stomach with a full glass of water. 90 tablet 0  . lidocaine (XYLOCAINE) 2 % jelly Apply 1 application topically daily as needed (pain).    Marland Kitchen lisinopril (PRINIVIL,ZESTRIL) 30 MG tablet Take 1 tablet (30 mg total) by mouth daily. 90 tablet 2  . PARoxetine (PAXIL) 20 MG tablet Take 1.5 tablets (30 mg total) by mouth daily. 135 tablet 2  . promethazine (PHENERGAN) 12.5 MG tablet Take 1 tablet (12.5 mg total) by mouth every 8 (eight) hours as needed for nausea or vomiting. 20 tablet 0  . traZODone (DESYREL) 100 MG tablet Take 100 mg by mouth at bedtime.     No current facility-administered medications on file prior to visit.     BP 124/64   Pulse 67   Temp 98.5 F (36.9 C) (Oral)   Wt 266 lb 6.4 oz (120.8 kg)   LMP 06/17/2007   SpO2 97%   BMI 51.17 kg/m    Objective:   Physical Exam  Constitutional: She appears well-nourished. She does not appear ill. No distress.  Neck: Neck supple.    Cardiovascular: Normal rate.   Pulmonary/Chest: Effort normal.  Abdominal: Soft. Normal appearance and bowel sounds are normal. There is tenderness in the left lower quadrant.  Skin: Skin is warm and dry.          Assessment & Plan:  Abdominal Pain:  Located to LLQ x 24-36 hours. Exam today with LLQ tenderness. Does not appear sickly or distressed. Labs last week stable, no leukocytosis. Given recent history of hospital admission for GI symptoms and examination today will obtain CT abdomen pelvis for further evaluation. Order placed. Renal function has returned to baseline as evidenced by labs lats week. Will await results.  Sheral Flow, NP

## 2017-06-02 ENCOUNTER — Ambulatory Visit: Payer: Self-pay | Admitting: Surgery

## 2017-06-07 ENCOUNTER — Encounter: Payer: Self-pay | Admitting: Primary Care

## 2017-06-17 ENCOUNTER — Ambulatory Visit (INDEPENDENT_AMBULATORY_CARE_PROVIDER_SITE_OTHER): Payer: No Typology Code available for payment source | Admitting: Family Medicine

## 2017-06-17 ENCOUNTER — Encounter: Payer: Self-pay | Admitting: Primary Care

## 2017-06-17 ENCOUNTER — Encounter: Payer: Self-pay | Admitting: Family Medicine

## 2017-06-17 VITALS — BP 106/72 | HR 84 | Temp 98.4°F | Wt 264.5 lb

## 2017-06-17 DIAGNOSIS — K529 Noninfective gastroenteritis and colitis, unspecified: Secondary | ICD-10-CM

## 2017-06-17 DIAGNOSIS — R112 Nausea with vomiting, unspecified: Secondary | ICD-10-CM

## 2017-06-17 DIAGNOSIS — R197 Diarrhea, unspecified: Secondary | ICD-10-CM

## 2017-06-17 LAB — BASIC METABOLIC PANEL
BUN: 23 mg/dL (ref 6–23)
CALCIUM: 10 mg/dL (ref 8.4–10.5)
CO2: 27 meq/L (ref 19–32)
CREATININE: 1.57 mg/dL — AB (ref 0.40–1.20)
Chloride: 103 mEq/L (ref 96–112)
GFR: 36.52 mL/min — ABNORMAL LOW (ref >60.00)
Glucose, Bld: 99 mg/dL (ref 70–99)
Potassium: 4.4 mEq/L (ref 3.5–5.1)
Sodium: 139 mEq/L (ref 135–145)

## 2017-06-17 MED ORDER — PROMETHAZINE HCL 12.5 MG PO TABS: 12.5000 mg | ORAL_TABLET | Freq: Three times a day (TID) | ORAL | 0 refills | Status: DC | PRN

## 2017-06-17 NOTE — Patient Instructions (Addendum)
Go to the lab on the way out.  We'll contact you with your lab report. See the changes on the med list- skip your BP meds that I marked in the meantime.  Phenergan as needed.  Sips of fluids.  Avoid dairy.  Update [place] as needed.  Take care.  Glad to see you.

## 2017-06-17 NOTE — Progress Notes (Signed)
Sx started yesterday.  Diarrhea initially. Then vomiting.  Tried sips of fluid this AM.  Still vomiting this AM.  Last vomited just prior to arrival.  No blood in vomiting.  No blood in diarrhea.  7 episodes of diarrhea today, with urgency, liquid.    No fevers, no burning with urination.  Some chills.  Lower L sided back pain and B lower abd cramping.   No one else is sick at home.    This did not feel like her previous episode of diverticulitis.  She did not have vomiting with that. With diverticulitis she had a gradual increase in symptoms. This had abrupt onset.  Meds, vitals, and allergies reviewed.   ROS: Per HPI unless specifically indicated in ROS section   GEN: nad, alert and oriented HEENT: mucous membranes moist, OP wnl NECK: supple w/o LA CV: rrr. PULM: ctab, no inc wob ABD: soft, +bs, diffusely mildly tender to palpation. Not focally tender. No rebound. EXT: no edema SKIN: no acute rash

## 2017-06-18 DIAGNOSIS — K529 Noninfective gastroenteritis and colitis, unspecified: Secondary | ICD-10-CM | POA: Insufficient documentation

## 2017-06-18 NOTE — Assessment & Plan Note (Signed)
Likely benign viral gastroenteritis. Supportive care. Sips of fluids. Try Phenergan for nausea. Routine cautions given. Check basic labs today given history of elevated creatinine previously. She can skip her lisinopril and hydrochlorothiazide today. She can skip her amlodipine if needed. All discussed with patient. Okay for outpatient follow-up. She agrees with plan.

## 2017-06-21 ENCOUNTER — Encounter: Payer: Self-pay | Admitting: Primary Care

## 2017-06-23 ENCOUNTER — Encounter: Payer: Self-pay | Admitting: Primary Care

## 2017-06-23 ENCOUNTER — Ambulatory Visit (INDEPENDENT_AMBULATORY_CARE_PROVIDER_SITE_OTHER): Payer: No Typology Code available for payment source | Admitting: Primary Care

## 2017-06-23 VITALS — BP 120/78 | HR 74 | Temp 99.6°F | Ht 60.5 in | Wt 264.8 lb

## 2017-06-23 DIAGNOSIS — R1032 Left lower quadrant pain: Secondary | ICD-10-CM

## 2017-06-23 DIAGNOSIS — R197 Diarrhea, unspecified: Secondary | ICD-10-CM

## 2017-06-23 DIAGNOSIS — K5792 Diverticulitis of intestine, part unspecified, without perforation or abscess without bleeding: Secondary | ICD-10-CM | POA: Insufficient documentation

## 2017-06-23 LAB — POC URINALSYSI DIPSTICK (AUTOMATED)
Bilirubin, UA: NEGATIVE
Glucose, UA: NEGATIVE
Ketones, UA: NEGATIVE
LEUKOCYTES UA: NEGATIVE
NITRITE UA: NEGATIVE
PH UA: 6 (ref 5.0–8.0)
RBC UA: NEGATIVE
Spec Grav, UA: 1.025 (ref 1.010–1.025)
UROBILINOGEN UA: 0.2 U/dL

## 2017-06-23 LAB — CBC WITH DIFFERENTIAL/PLATELET
BASOS ABS: 0.1 10*3/uL (ref 0.0–0.1)
Basophils Relative: 0.6 % (ref 0.0–3.0)
EOS ABS: 0.2 10*3/uL (ref 0.0–0.7)
Eosinophils Relative: 2.4 % (ref 0.0–5.0)
HCT: 39.9 % (ref 36.0–46.0)
Hemoglobin: 13.3 g/dL (ref 12.0–15.0)
LYMPHS ABS: 1.3 10*3/uL (ref 0.7–4.0)
Lymphocytes Relative: 14.4 % (ref 12.0–46.0)
MCHC: 33.3 g/dL (ref 30.0–36.0)
MCV: 92.1 fl (ref 78.0–100.0)
MONO ABS: 0.5 10*3/uL (ref 0.1–1.0)
Monocytes Relative: 5.3 % (ref 3.0–12.0)
NEUTROS ABS: 7.2 10*3/uL (ref 1.4–7.7)
NEUTROS PCT: 77.3 % — AB (ref 43.0–77.0)
PLATELETS: 276 10*3/uL (ref 150.0–400.0)
RBC: 4.33 Mil/uL (ref 3.87–5.11)
RDW: 14.1 % (ref 11.5–15.5)
WBC: 9.3 10*3/uL (ref 4.0–10.5)

## 2017-06-23 LAB — BASIC METABOLIC PANEL
BUN: 17 mg/dL (ref 6–23)
CHLORIDE: 104 meq/L (ref 96–112)
CO2: 25 mEq/L (ref 19–32)
CREATININE: 1.31 mg/dL — AB (ref 0.40–1.20)
Calcium: 10.6 mg/dL — ABNORMAL HIGH (ref 8.4–10.5)
GFR: 45 mL/min — AB (ref >60.00)
Glucose, Bld: 102 mg/dL — ABNORMAL HIGH (ref 70–99)
Potassium: 4.2 mEq/L (ref 3.5–5.1)
Sodium: 139 mEq/L (ref 135–145)

## 2017-06-23 MED ORDER — METRONIDAZOLE 500 MG PO TABS: 500.0000 mg | ORAL_TABLET | Freq: Three times a day (TID) | ORAL | 0 refills | Status: DC

## 2017-06-23 MED ORDER — CIPROFLOXACIN HCL 250 MG PO TABS: 250.0000 mg | ORAL_TABLET | Freq: Two times a day (BID) | ORAL | 0 refills | Status: AC

## 2017-06-23 NOTE — Assessment & Plan Note (Signed)
Intermittent for the past 1 month, restarted constantly over the last 3 days. Now with fevers and a return in LLQ abdominal pain. Suspect diverticulitis was never completely healed despite treatment with Augmentin course. Will start renally dosed Cipro and Flagyl course, clear liquid. Check BMP and CBC today. Strict return/ED precautions provided, especially given CKD.

## 2017-06-23 NOTE — Addendum Note (Signed)
Addended by: Jacqualin Combes on: 06/23/2017 12:03 PM   Modules accepted: Orders

## 2017-06-23 NOTE — Assessment & Plan Note (Signed)
Treated 2 weeks ago with Augmentin course. Intermittent diarrhea for the past 1 month, restarted constantly over the last 3 days. Now with fevers and a return in LLQ abdominal pain. Suspect diverticulitis was never completely healed despite treatment with Augmentin course. Will start renally dosed Cipro and Flagyl course, clear liquid. Check BMP and CBC today. Strict return/ED precautions provided, especially given CKD.

## 2017-06-23 NOTE — Patient Instructions (Signed)
Start ciprofloxacin antibiotics. Take 1 tablet by mouth twice daily for 10 days.  Start metronidazole 500 mg tablets. Take 1 tablet by mouth three times daily for 10 days.  Complete lab work prior to leaving today. I will notify you of your results once received.   Continue on a clear liquid diet. You must try to drink water, not soda.  Please notify me if no improvement in 3-4 days.  It was a pleasure to see you today!   Clear Liquid Diet, Adult A clear liquid diet is a diet that includes only liquids that you can see through. You may need to follow a clear liquid diet if:  You develop a medical condition right before or after you have surgery.  You were not able to eat food for a long period of time.  You had a condition that gave you diarrhea.  You are going to have an exam, such as a colonoscopy, in which instruments will be put into your body to look at parts of your digestive system.  You are going to have bowel surgery.  The usual goals of this diet are:  To rest the stomach and digestive system as much as possible.  To keep you hydrated.  To make sure you get some calories for energy.  To help you return to normal digestion.  Most people need to follow this diet for only a short period of time. What do I need to know about this diet?  A clear liquid is a liquid that you can see through when you hold it up to a light.  A clear liquid diet does not provide all the nutrients that you need. It is important to choose a variety of the liquids that are allowed on this diet. That way, you will get as many nutrients as possible.  If you are not sure whether you can have certain items, ask your health care provider. What can I have?  Water and flavored water.  Fruit juices that do not have pulp, such as cranberry juice and apple juice.  Tea and coffee without milk or cream.  Clear bouillon or broth.  Broth-based soups that have been strained.  Flavored  gelatins.  Honey.  Sugar water.  Frozen ice or frozen ice pops that do not contain milk, yogurt, fruit pieces, or fruit pulp.  Clear sodas.  Clear sports drinks. The items listed above may not be a complete list of recommended liquids. Contact your dietitian for more options. What can I not have?  Juices that have pulp.  Milk.  Cream or cream-based soups.  Yogurt. The items listed above may not be a complete list of liquids to avoid. Contact your dietitian for more information. Summary  A clear liquid diet is a diet that includes only liquids that you can see through.  The goal of this diet is to help you recover by resting your digestive system, keeping you hydrated, and providing nutrients.  Make sure to avoid liquids with milk, cream, or pulp while on this diet. This information is not intended to replace advice given to you by your health care provider. Make sure you discuss any questions you have with your health care provider. Document Released: 09/28/2005 Document Revised: 05/12/2016 Document Reviewed: 08/25/2013 Elsevier Interactive Patient Education  [employer].

## 2017-06-23 NOTE — Progress Notes (Signed)
Subjective:    Patient ID: [name], female    DOB: 04/30/1961, 56 y.o.   MRN: [id number]  HPI  [name] is a 56 year old female with a history of diverticulitis, CKD, diarrhea who presents today with a chief complaint of abdominal pain. She also reports diarrhea and fever.   She has a history of intermittent diarrhea for the past one month. She was treated for acute diverticulitis several weeks ago with an Augmentin course, felt better. She was seen again last week for complaints diarrhea that was presumed to be viral in nature, did not feel like diverticulitis pain.  Since her visit last week her diarrhea has continued for the past 2-3 days. She's experiencing 3-4 episodes of diarrhea daily, small amounts. She denies rectal bleeding. Her LLQ abdominal pain began again four days ago that feels similar to her pain with diverticulitis. She first noticed fevers for the last 2 days which are running 99-100.8. She's had a decrease in appetite as she will experience nausea and abdominal pain. She's drinking ginger ale, little water.   She's been taking the phenergan with temporary improvement. She's also taking Tylenol without improvement with fevers. She denies vomiting.  Review of Systems  Constitutional: Positive for fatigue and fever.  Gastrointestinal: Positive for abdominal pain, diarrhea and nausea. Negative for blood in stool and vomiting.       Past Medical History:  Diagnosis Date  . Acute kidney injury ([place]) 05/19/2017  . Acute myofascial strain of lumbosacral region 08/20/2016  . Allergic rhinitis   . Anemia, iron deficiency   . ANEMIA, IRON DEFICIENCY, history of 04/05/2008   Qualifier: Diagnosis of  By: Philbert Riser MD, Manrique    . Anginal pain [facility])    last cardiologist visit 2010 - dx/ed with anxiety instead of cardiac pain  . Arthritis   . Atypical chest pain    Negative cardiolite at [facility] cards 11/07  . BPPV (benign paroxysmal positional vertigo) 01/07/2017  .  CHOLECYSTECTOMY, HX OF    Annotation: 2000 Qualifier: Diagnosis of  By: Eddie Dibbles MD, Bhakti    . Chronic left maxillary sinus disease 09/22/2012  . Chronic low back pain 11/08/2015  . Diarrhea 05/19/2017  . Elevated BP   . Family history of leukemia 09/22/2012   She states that both her uncle and his son was diagnosed with Leukemia. She would like to know more information about the leukemia.       . Hepatic steatosis 09/22/2012   Incidental finding on abd CT in 2011   . History of blood transfusion 03/2008   Requried 2u prbc; menometrorrhagia, uterine fibroids.  Marland Kitchen HTN (hypertension) 02/09/2014  . Hypertension   . Hypertriglyceridemia    164 - 2/11  . Hypothyroidism   . Injury of left rotator cuff 12/02/2016  . Insomnia   . INSOMNIA UNSPECIFIED 05/01/2010   Qualifier: Diagnosis of  By: Shon Baton, MD, Janett Billow    . Metabolic syndrome 01/60/1093  . Nausea 05/19/2017  . Nausea vomiting and diarrhea   . 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 Obesity    250 lbs 8/11  . OBESITY 08/31/2006   Annotation: Wt. 248 lbs. 11/2004 Qualifier: Diagnosis of  By: Eddie Dibbles MD, Bhakti    . Panic disorder 11/05  . PANIC DISORDER 08/31/2006   Diagnosed 11/05, well-controlled with Paxil, patient also takes albuterol when she has a panic attack, maybe once/month   . Pectus excavatum 09/22/2012   Incidental finding on CXR in 2013.    Marland Kitchen  Perirectal abscess 11/14/2011  . Prediabetes 09/22/2012  . Preventative health care 02/09/2014  . Proteinuria 07/17/2012  . Shortness of breath 05/18/2012   w/exertion"  . Urinary incontinence without sensory awareness 12/17/2016  . Venous (peripheral) insufficiency 11/08/2015     Social History   Social History  . Marital status: Married    Spouse name: N/A  . Number of children: N/A  . Years of education: N/A   Occupational History  . Not on file.   Social History Main Topics  . Smoking status: Never Smoker  . Smokeless tobacco: Never Used  . Alcohol use No  . Drug use: No  . Sexual  activity: Not Currently   Other Topics Concern  . Not on file   Social History Narrative   Currently unemployed. Starting college in Aug for accounting.  Married, has 2 children lives with her youngest son.      Past Surgical History:  Procedure Laterality Date  . ABDOMINAL HYSTERECTOMY  04/2007   total abdominal hysterectomy ( cervix) with bilateral salpingo-oopherectomy  . CHOLECYSTECTOMY  07/30/1999   Diagnosis of  By: Eddie Dibbles MD, Bhakti    . DILATION AND CURETTAGE OF UTERUS  1987  . INCISION AND DRAINAGE ABSCESS  11/02/2012   Procedure: INCISION AND DRAINAGE ABSCESS;  Surgeon: Odis Hollingshead, MD;  Location: [place];  Service: General;  Laterality: N/A;  I&D anorectal abscess  . INCISION AND DRAINAGE PERIRECTAL ABSCESS  11/14/2011   Procedure: IRRIGATION AND DEBRIDEMENT PERIRECTAL ABSCESS;  Surgeon: Zenovia Jarred, MD;  Location: [place];  Service: General;  Laterality: N/A;  . NERVE, TENDON AND ARTERY REPAIR Left 06/11/2013   Procedure: EXPLORATION AND REPAIR LEFT FOREARM;  Surgeon: Schuyler Amor, MD;  Location: [place];  Service: Orthopedics;  Laterality: Left;  . SHOULDER ARTHROSCOPY WITH ROTATOR CUFF REPAIR AND SUBACROMIAL DECOMPRESSION Left 03/06/2016   Procedure: LEFT SHOULDER ARTHROSCOPY WITH ROTATOR CUFF REPAIR AND SUBACROMIAL DECOMPRESSION, DISTAL CLAVICLE EXCISION;  Surgeon: Leandrew Koyanagi, MD;  Location: [place];  Service: Orthopedics;  Laterality: Left;    Family History  Problem Relation Age of Onset  . Heart attack Mother        at the age of 61's  . Lung cancer Mother        metestatic, + smoker  . Aneurysm Mother   . Hyperlipidemia Mother   . Hypertension Mother   . Hypertension Father   . Heart attack Maternal Grandmother        at the age of 29's    Allergies  Allergen Reactions  . Cephalexin Itching  . Doxycycline Nausea And Vomiting  . Dilaudid [Hydromorphone Hcl] Other (See Comments)    Panic attack  . Sulfur Rash    Itching with rash    Current  Outpatient Prescriptions on File Prior to Visit  Medication Sig Dispense Refill  . acetaminophen (TYLENOL) 500 MG tablet Take 1,000 mg by mouth every 8 (eight) hours as needed for mild pain.    Marland Kitchen amLODipine (NORVASC) 10 MG tablet Take 1 tablet by mouth once daily for blood pressure. 30 tablet 0  . buPROPion (WELLBUTRIN XL) 300 MG 24 hr tablet Take 300 mg by mouth daily.    . cyclobenzaprine (FLEXERIL) 5 MG tablet Take 1 tablet (5 mg total) by mouth 2 (two) times daily as needed for muscle spasms. 30 tablet 0  . dibucaine (NUPERCAINAL) 1 % OINT Place 1 application rectally as needed for hemorrhoids. 28 g 0  . hydrochlorothiazide (HYDRODIURIL) 25  MG tablet Take 1 tablet (25 mg total) by mouth daily. 30 tablet 11  . levothyroxine (SYNTHROID) 88 MCG tablet Take 1 tablet by mouth every morning on an empty stomach with a full glass of water. 90 tablet 0  . lidocaine (XYLOCAINE) 2 % jelly Apply 1 application topically daily as needed (pain).    Marland Kitchen lisinopril (PRINIVIL,ZESTRIL) 30 MG tablet Take 1 tablet (30 mg total) by mouth daily. 90 tablet 2  . PARoxetine (PAXIL) 20 MG tablet Take 1.5 tablets (30 mg total) by mouth daily. 135 tablet 2  . promethazine (PHENERGAN) 12.5 MG tablet Take 1 tablet (12.5 mg total) by mouth every 8 (eight) hours as needed for nausea or vomiting. 20 tablet 0  . traZODone (DESYREL) 100 MG tablet Take 100 mg by mouth at bedtime.     No current facility-administered medications on file prior to visit.     BP 120/78   Pulse 74   Temp 99.6 F (37.6 C) (Oral)   Ht 5' 0.5" (1.537 m)   Wt 264 lb 12.8 oz (120.1 kg)   LMP 06/17/2007   SpO2 98%   BMI 50.86 kg/m    Objective:   Physical Exam  Constitutional: She appears well-nourished.  In no distress, sitting comfortably   Neck: Neck supple.  Cardiovascular: Normal rate and regular rhythm.   Pulmonary/Chest: Effort normal and breath sounds normal.  Abdominal: Soft. Normal appearance and bowel sounds are normal. There is  tenderness in the left lower quadrant. There is no CVA tenderness.  Skin: Skin is warm and dry.          Assessment & Plan:

## 2017-06-28 ENCOUNTER — Encounter: Payer: Self-pay | Admitting: Primary Care

## 2017-07-13 ENCOUNTER — Encounter: Payer: Self-pay | Admitting: Primary Care

## 2017-07-20 ENCOUNTER — Ambulatory Visit (INDEPENDENT_AMBULATORY_CARE_PROVIDER_SITE_OTHER): Payer: No Typology Code available for payment source | Admitting: Primary Care

## 2017-07-20 ENCOUNTER — Encounter: Payer: Self-pay | Admitting: Primary Care

## 2017-07-20 VITALS — BP 124/76 | HR 63 | Temp 98.1°F | Ht 60.5 in | Wt 264.8 lb

## 2017-07-20 DIAGNOSIS — E039 Hypothyroidism, unspecified: Secondary | ICD-10-CM

## 2017-07-20 DIAGNOSIS — I10 Essential (primary) hypertension: Secondary | ICD-10-CM

## 2017-07-20 DIAGNOSIS — Z23 Encounter for immunization: Secondary | ICD-10-CM

## 2017-07-20 DIAGNOSIS — F410 Panic disorder [episodic paroxysmal anxiety] without agoraphobia: Secondary | ICD-10-CM

## 2017-07-20 DIAGNOSIS — B86 Scabies: Secondary | ICD-10-CM

## 2017-07-20 MED ORDER — HYDROCHLOROTHIAZIDE 25 MG PO TABS: 25.0000 mg | ORAL_TABLET | Freq: Every day | ORAL | 3 refills | Status: DC

## 2017-07-20 MED ORDER — LEVOTHYROXINE SODIUM 88 MCG PO TABS: ORAL_TABLET | ORAL | 2 refills | Status: DC

## 2017-07-20 MED ORDER — PAROXETINE HCL 20 MG PO TABS: 30.0000 mg | ORAL_TABLET | Freq: Every day | ORAL | 2 refills | Status: DC

## 2017-07-20 MED ORDER — AMLODIPINE BESYLATE 10 MG PO TABS: ORAL_TABLET | ORAL | 3 refills | Status: DC

## 2017-07-20 MED ORDER — PERMETHRIN 5 % EX CREA: TOPICAL_CREAM | CUTANEOUS | 0 refills | Status: DC

## 2017-07-20 NOTE — Addendum Note (Signed)
Addended by: Jacqualin Combes on: 07/20/2017 04:32 PM   Modules accepted: Orders

## 2017-07-20 NOTE — Patient Instructions (Addendum)
Apply 1/2 of the permethrin cream tube to your body from the neck down to your toes once. Leave this on for 8 hours then shower off.  Vacuum all couches, chairs, mattresses as discussed.  It was a pleasure to see you today!

## 2017-07-20 NOTE — Assessment & Plan Note (Signed)
Stable in the office today. Continue HCTZ, lisinopril, amlodipine. BMP UTD.

## 2017-07-20 NOTE — Assessment & Plan Note (Signed)
Doing well on Paxil, refills provided today.

## 2017-07-20 NOTE — Progress Notes (Signed)
Subjective:    Patient ID: [name], female    DOB: [birth year], 56 y.o.   MRN: [id number]  HPI  [name] is a 56 year old female who presents today with a chief complaint of rash and medication refill.  1) Rash: Located to the bilateral upper and lower extremities, and between her fingers that she first noticed 2-3 weeks ago. No one else in her house is itching. She's been using benadryl, zyrtec, Pepcid, and hydrocortisone cream without improvement. She denies changes in soaps/detergents/medication/food. She denies pets with fleas, exposure to dirty places.   She is needing refills of HCTZ, amlodipine, levothyroxine, paroxetine.   Review of Systems  Eyes: Negative for visual disturbance.  Respiratory: Negative for shortness of breath.   Cardiovascular: Negative for chest pain.  Skin: Positive for rash.  Neurological: Negative for dizziness and headaches.       Past Medical History:  Diagnosis Date  . Acute kidney injury ([place]) 05/19/2017  . Acute myofascial strain of lumbosacral region 08/20/2016  . Allergic rhinitis   . Anemia, iron deficiency   . ANEMIA, IRON DEFICIENCY, history of 04/05/2008   Qualifier: Diagnosis of  By: Philbert Riser MD, Manrique    . Anginal pain [facility])    last cardiologist visit 2010 - dx/ed with anxiety instead of cardiac pain  . Arthritis   . Atypical chest pain    Negative cardiolite at [facility] cards 11/07  . BPPV (benign paroxysmal positional vertigo) 01/07/2017  . CHOLECYSTECTOMY, HX OF    Annotation: 2000 Qualifier: Diagnosis of  By: Eddie Dibbles MD, Bhakti    . Chronic left maxillary sinus disease 09/22/2012  . Chronic low back pain 11/08/2015  . Diarrhea 05/19/2017  . Elevated BP   . Family history of leukemia 09/22/2012   She states that both her uncle and his son was diagnosed with Leukemia. She would like to know more information about the leukemia.       . Hepatic steatosis 09/22/2012   Incidental finding on abd CT in 2011   . History of blood  transfusion 03/2008   Requried 2u prbc; menometrorrhagia, uterine fibroids.  Marland Kitchen HTN (hypertension) 02/09/2014  . Hypertension   . Hypertriglyceridemia    164 - 2/11  . Hypothyroidism   . Injury of left rotator cuff 12/02/2016  . Insomnia   . INSOMNIA UNSPECIFIED 05/01/2010   Qualifier: Diagnosis of  By: Shon Baton, MD, Janett Billow    . Metabolic syndrome 37/90/2409  . Nausea 05/19/2017  . Nausea vomiting and diarrhea   . 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 Obesity    250 lbs 8/11  . OBESITY 08/31/2006   Annotation: Wt. 248 lbs. 11/2004 Qualifier: Diagnosis of  By: Eddie Dibbles MD, Bhakti    . Panic disorder 11/05  . PANIC DISORDER 08/31/2006   Diagnosed 11/05, well-controlled with Paxil, patient also takes albuterol when she has a panic attack, maybe once/month   . Pectus excavatum 09/22/2012   Incidental finding on CXR in 2013.    Marland Kitchen Perirectal abscess 11/14/2011  . Prediabetes 09/22/2012  . Preventative health care 02/09/2014  . Proteinuria 07/17/2012  . Shortness of breath 05/18/2012   w/exertion"  . Urinary incontinence without sensory awareness 12/17/2016  . Venous (peripheral) insufficiency 11/08/2015     Social History   Social History  . Marital status: Married    Spouse name: N/A  . Number of children: N/A  . Years of education: N/A   Occupational History  . Not on file.   Social  History Main Topics  . Smoking status: Never Smoker  . Smokeless tobacco: Never Used  . Alcohol use No  . Drug use: No  . Sexual activity: Not Currently   Other Topics Concern  . Not on file   Social History Narrative   Currently unemployed. Starting college in Aug for accounting.  Married, has 2 children lives with her youngest son.      Past Surgical History:  Procedure Laterality Date  . ABDOMINAL HYSTERECTOMY  04/2007   total abdominal hysterectomy ( cervix) with bilateral salpingo-oopherectomy  . CHOLECYSTECTOMY  07/30/1999   Diagnosis of  By: Eddie Dibbles MD, Bhakti    . DILATION AND CURETTAGE OF UTERUS   1987  . INCISION AND DRAINAGE ABSCESS  11/02/2012   Procedure: INCISION AND DRAINAGE ABSCESS;  Surgeon: Odis Hollingshead, MD;  Location: [place];  Service: General;  Laterality: N/A;  I&D anorectal abscess  . INCISION AND DRAINAGE PERIRECTAL ABSCESS  11/14/2011   Procedure: IRRIGATION AND DEBRIDEMENT PERIRECTAL ABSCESS;  Surgeon: Zenovia Jarred, MD;  Location: [place];  Service: General;  Laterality: N/A;  . NERVE, TENDON AND ARTERY REPAIR Left 06/11/2013   Procedure: EXPLORATION AND REPAIR LEFT FOREARM;  Surgeon: Schuyler Amor, MD;  Location: [place];  Service: Orthopedics;  Laterality: Left;  . SHOULDER ARTHROSCOPY WITH ROTATOR CUFF REPAIR AND SUBACROMIAL DECOMPRESSION Left 03/06/2016   Procedure: LEFT SHOULDER ARTHROSCOPY WITH ROTATOR CUFF REPAIR AND SUBACROMIAL DECOMPRESSION, DISTAL CLAVICLE EXCISION;  Surgeon: Leandrew Koyanagi, MD;  Location: [place];  Service: Orthopedics;  Laterality: Left;    Family History  Problem Relation Age of Onset  . Heart attack Mother        at the age of 84's  . Lung cancer Mother        metestatic, + smoker  . Aneurysm Mother   . Hyperlipidemia Mother   . Hypertension Mother   . Hypertension Father   . Heart attack Maternal Grandmother        at the age of 11's    Allergies  Allergen Reactions  . Cephalexin Itching  . Doxycycline Nausea And Vomiting  . Dilaudid [Hydromorphone Hcl] Other (See Comments)    Panic attack  . Sulfur Rash    Itching with rash    Current Outpatient Prescriptions on File Prior to Visit  Medication Sig Dispense Refill  . acetaminophen (TYLENOL) 500 MG tablet Take 1,000 mg by mouth every 8 (eight) hours as needed for mild pain.    Marland Kitchen buPROPion (WELLBUTRIN XL) 300 MG 24 hr tablet Take 300 mg by mouth daily.    . dibucaine (NUPERCAINAL) 1 % OINT Place 1 application rectally as needed for hemorrhoids. 28 g 0  . lisinopril (PRINIVIL,ZESTRIL) 30 MG tablet Take 1 tablet (30 mg total) by mouth daily. 90 tablet 2  . traZODone (DESYREL)  100 MG tablet Take 100 mg by mouth at bedtime.     No current facility-administered medications on file prior to visit.     BP 124/76   Pulse 63   Temp 98.1 F (36.7 C) (Oral)   Ht 5' 0.5" (1.537 m)   Wt 264 lb 12.8 oz (120.1 kg)   LMP 06/17/2007   SpO2 98%   BMI 50.86 kg/m    Objective:   Physical Exam  Constitutional: She appears well-nourished.  Neck: Neck supple.  Cardiovascular: Normal rate and regular rhythm.   Pulmonary/Chest: Effort normal and breath sounds normal.  Skin: Skin is warm and dry.  Small, raised, red  bumps to bilateral forearms with bumps in between webs of fingers. No wheals, scaling, purpura, vesicles.            Assessment & Plan:  Scabies/Bed Bugs:  Rash to bilateral upper and lower extremities x 2-3 weeks. Exam today with strong suspicion for scabies/bed bugs based off of exam. Rx for Permethrin cream sent to pharmacy with instructions for use. Discussed to clean all furniture, linens, clothing. Follow up PRN.  Sheral Flow, NP

## 2017-07-20 NOTE — Assessment & Plan Note (Signed)
Recent TSH stable. Refills for levothyroxine 88 mcg provided.

## 2017-07-22 ENCOUNTER — Encounter: Payer: Self-pay | Admitting: Primary Care

## 2017-07-23 NOTE — Telephone Encounter (Signed)
Rosaria Ferries, see my chart message. Anyone who can see her sooner? If not, then okay to wait until December.

## 2017-08-09 ENCOUNTER — Encounter: Payer: Self-pay | Admitting: Primary Care

## 2017-08-25 ENCOUNTER — Other Ambulatory Visit: Payer: Self-pay | Admitting: Primary Care

## 2017-08-25 ENCOUNTER — Ambulatory Visit (INDEPENDENT_AMBULATORY_CARE_PROVIDER_SITE_OTHER): Payer: No Typology Code available for payment source | Admitting: Primary Care

## 2017-08-25 ENCOUNTER — Encounter: Payer: Self-pay | Admitting: Primary Care

## 2017-08-25 VITALS — BP 124/76 | HR 74 | Temp 98.1°F | Ht 60.5 in | Wt 261.1 lb

## 2017-08-25 DIAGNOSIS — M109 Gout, unspecified: Secondary | ICD-10-CM

## 2017-08-25 DIAGNOSIS — M79672 Pain in left foot: Secondary | ICD-10-CM

## 2017-08-25 LAB — CBC WITH DIFFERENTIAL/PLATELET
BASOS PCT: 1 % (ref 0.0–3.0)
Basophils Absolute: 0.1 10*3/uL (ref 0.0–0.1)
Eosinophils Absolute: 0.2 10*3/uL (ref 0.0–0.7)
Eosinophils Relative: 3 % (ref 0.0–5.0)
HEMATOCRIT: 38.8 % (ref 36.0–46.0)
Hemoglobin: 12.8 g/dL (ref 12.0–15.0)
LYMPHS PCT: 18.3 % (ref 12.0–46.0)
Lymphs Abs: 1.2 10*3/uL (ref 0.7–4.0)
MCHC: 33 g/dL (ref 30.0–36.0)
MCV: 91.2 fl (ref 78.0–100.0)
MONOS PCT: 7.2 % (ref 3.0–12.0)
Monocytes Absolute: 0.5 10*3/uL (ref 0.1–1.0)
NEUTROS ABS: 4.7 10*3/uL (ref 1.4–7.7)
Neutrophils Relative %: 70.5 % (ref 43.0–77.0)
Platelets: 287 10*3/uL (ref 150.0–400.0)
RBC: 4.25 Mil/uL (ref 3.87–5.11)
RDW: 13.9 % (ref 11.5–15.5)
WBC: 6.7 10*3/uL (ref 4.0–10.5)

## 2017-08-25 LAB — BASIC METABOLIC PANEL
BUN: 29 mg/dL — AB (ref 6–23)
CHLORIDE: 105 meq/L (ref 96–112)
CO2: 26 mEq/L (ref 19–32)
CREATININE: 1.36 mg/dL — AB (ref 0.40–1.20)
Calcium: 9.7 mg/dL (ref 8.4–10.5)
GFR: 43.07 mL/min — AB (ref >60.00)
GLUCOSE: 107 mg/dL — AB (ref 70–99)
POTASSIUM: 4.2 meq/L (ref 3.5–5.1)
Sodium: 138 mEq/L (ref 135–145)

## 2017-08-25 LAB — URIC ACID: Uric Acid, Serum: 9.1 mg/dL — ABNORMAL HIGH (ref 2.4–7.0)

## 2017-08-25 MED ORDER — PREDNISONE 10 MG PO TABS: ORAL_TABLET | ORAL | 0 refills | Status: DC

## 2017-08-25 NOTE — Patient Instructions (Signed)
Complete lab work prior to leaving today. I will notify you of your results once received.   Continue to elevate your foot. You may take tylenol as needed for pain, do not exceed 3000 mg in 24 hours.  I'll be in touch soon! It was a pleasure to see you today!

## 2017-08-25 NOTE — Progress Notes (Signed)
Subjective:    Patient ID: [name], female    DOB: [birth year], 56 y.o.   MRN: [id number]  HPI  Ms. Monroy is a 56 year old female with a history of hypertension, CKD Stage III, prediabetes, hypothyroidism who presents today with a chief complaint of foot pain. She also reports swelling and redness. Her pain is located to the left plantar foot over her metatarsal bones and toes. This began three days ago while walking into the kitchen, she noticed a sudden onset of sharp pain that has persisted since. Her foot is painful with any pressure. She denies recent injury/trauma, open wounds, drainage, fevers. She's been taking tylenol, applying icy hot, soaking in epsolm salt, and elevation without improvement.   Review of Systems  Constitutional: Negative for fever.  Musculoskeletal:       Left plantar foot pain  Skin: Positive for color change. Negative for wound.  Neurological: Negative for weakness.       Past Medical History:  Diagnosis Date  . Acute kidney injury ([place]) 05/19/2017  . Acute myofascial strain of lumbosacral region 08/20/2016  . Allergic rhinitis   . Anemia, iron deficiency   . ANEMIA, IRON DEFICIENCY, history of 04/05/2008   Qualifier: Diagnosis of  By: Philbert Riser MD, Manrique    . Anginal pain [facility])    last cardiologist visit 2010 - dx/ed with anxiety instead of cardiac pain  . Arthritis   . Atypical chest pain    Negative cardiolite at [facility] cards 11/07  . BPPV (benign paroxysmal positional vertigo) 01/07/2017  . CHOLECYSTECTOMY, HX OF    Annotation: 2000 Qualifier: Diagnosis of  By: Eddie Dibbles MD, Bhakti    . Chronic left maxillary sinus disease 09/22/2012  . Chronic low back pain 11/08/2015  . Diarrhea 05/19/2017  . Elevated BP   . Family history of leukemia 09/22/2012   She states that both her uncle and his son was diagnosed with Leukemia. She would like to know more information about the leukemia.       . Hepatic steatosis 09/22/2012   Incidental finding on abd  CT in 2011   . History of blood transfusion 03/2008   Requried 2u prbc; menometrorrhagia, uterine fibroids.  Marland Kitchen HTN (hypertension) 02/09/2014  . Hypertension   . Hypertriglyceridemia    164 - 2/11  . Hypothyroidism   . Injury of left rotator cuff 12/02/2016  . Insomnia   . INSOMNIA UNSPECIFIED 05/01/2010   Qualifier: Diagnosis of  By: Shon Baton, MD, Janett Billow    . Metabolic syndrome 01/75/1025  . Nausea 05/19/2017  . Nausea vomiting and diarrhea   . 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 Obesity    250 lbs 8/11  . OBESITY 08/31/2006   Annotation: Wt. 248 lbs. 11/2004 Qualifier: Diagnosis of  By: Eddie Dibbles MD, Bhakti    . Panic disorder 11/05  . PANIC DISORDER 08/31/2006   Diagnosed 11/05, well-controlled with Paxil, patient also takes albuterol when she has a panic attack, maybe once/month   . Pectus excavatum 09/22/2012   Incidental finding on CXR in 2013.    Marland Kitchen Perirectal abscess 11/14/2011  . Prediabetes 09/22/2012  . Preventative health care 02/09/2014  . Proteinuria 07/17/2012  . Shortness of breath 05/18/2012   w/exertion"  . Urinary incontinence without sensory awareness 12/17/2016  . Venous (peripheral) insufficiency 11/08/2015     Social History   Socioeconomic History  . Marital status: Married    Spouse name: Not on file  . Number of children: Not on file  .  Years of education: Not on file  . Highest education level: Not on file  Social Needs  . Financial resource strain: Not on file  . Food insecurity - worry: Not on file  . Food insecurity - inability: Not on file  . Transportation needs - medical: Not on file  . Transportation needs - non-medical: Not on file  Occupational History  . Not on file  Tobacco Use  . Smoking status: Never Smoker  . Smokeless tobacco: Never Used  Substance and Sexual Activity  . Alcohol use: No    Alcohol/week: 0.0 oz  . Drug use: No  . Sexual activity: Not Currently  Other Topics Concern  . Not on file  Social History Narrative   Currently  unemployed. Starting college in Aug for accounting.  Married, has 2 children lives with her youngest son.      Past Surgical History:  Procedure Laterality Date  . ABDOMINAL HYSTERECTOMY  04/2007   total abdominal hysterectomy ( cervix) with bilateral salpingo-oopherectomy  . CHOLECYSTECTOMY  07/30/1999   Diagnosis of  By: Eddie Dibbles MD, Bhakti    . DILATION AND CURETTAGE OF UTERUS  1987  . INCISION AND DRAINAGE PERIRECTAL ABSCESS  11/14/2011   Procedure: IRRIGATION AND DEBRIDEMENT PERIRECTAL ABSCESS;  Surgeon: Zenovia Jarred, MD;  Location: [facility];  Service: General;  Laterality: N/A;    Family History  Problem Relation Age of Onset  . Heart attack Mother        at the age of 40's  . Lung cancer Mother        metestatic, + smoker  . Aneurysm Mother   . Hyperlipidemia Mother   . Hypertension Mother   . Hypertension Father   . Heart attack Maternal Grandmother        at the age of 80's    Allergies  Allergen Reactions  . Cephalexin Itching  . Doxycycline Nausea And Vomiting  . Dilaudid [Hydromorphone Hcl] Other (See Comments)    Panic attack  . Sulfur Rash    Itching with rash    Current Outpatient Medications on File Prior to Visit  Medication Sig Dispense Refill  . acetaminophen (TYLENOL) 500 MG tablet Take 1,000 mg by mouth every 8 (eight) hours as needed for mild pain.    Marland Kitchen amLODipine (NORVASC) 10 MG tablet Take 1 tablet by mouth once daily for blood pressure. 90 tablet 3  . buPROPion (WELLBUTRIN XL) 300 MG 24 hr tablet Take 300 mg by mouth daily.    . hydrochlorothiazide (HYDRODIURIL) 25 MG tablet Take 1 tablet (25 mg total) by mouth daily. 90 tablet 3  . levothyroxine (SYNTHROID) 88 MCG tablet Take 1 tablet by mouth every morning on an empty stomach with a full glass of water. 90 tablet 2  . lisinopril (PRINIVIL,ZESTRIL) 30 MG tablet Take 1 tablet (30 mg total) by mouth daily. 90 tablet 2  . PARoxetine (PAXIL) 20 MG tablet Take 1.5 tablets (30 mg total) by mouth daily. 135  tablet 2  . permethrin (ELIMITE) 5 % cream Apply 1/2 of the tube to entire body once from neck to toes. 60 g 0  . traZODone (DESYREL) 100 MG tablet Take 100 mg by mouth at bedtime.     No current facility-administered medications on file prior to visit.     BP 124/76   Pulse 74   Temp 98.1 F (36.7 C) (Oral)   Ht 5' 0.5" (1.537 m)   Wt 261 lb 1.9 oz (118.4 kg)  LMP 06/17/2007   SpO2 94%   BMI 50.16 kg/m    Objective:   Physical Exam  Constitutional: She appears well-nourished.  Neck: Neck supple.  Cardiovascular: Normal rate and regular rhythm.  Pulses:      Dorsalis pedis pulses are 2+ on the right side, and 2+ on the left side.       Posterior tibial pulses are 2+ on the right side, and 2+ on the left side.  Pulmonary/Chest: Effort normal and breath sounds normal.  Musculoskeletal:       Feet:  Mild erythema and moderate tenderness to left plantar foot as described in picture.  Skin: Skin is warm and dry.  Mild erythema to anterior toes of left foot. No open wounds. Mild swelling to all toes equally.            Assessment & Plan:  Acute Foot Pain:  Sudden onset to plantar foot three days ago. Exam today doesn't suggest cellulitis or trauma. Consider gout given moderate/severe sensitivity, especially given history of CKD and also hypertension with HCTZ treatment. Consider plantar fasciitis. Could be early neuropathy but doubt given sudden onset.  Check CBC, BMP, and uric acid levels. Avoid NSAID's given CKD. Continue tylenol and elevation. Will await labs.  Sheral Flow, NP

## 2017-08-27 ENCOUNTER — Encounter: Payer: Self-pay | Admitting: Primary Care

## 2017-08-27 DIAGNOSIS — Z1239 Encounter for other screening for malignant neoplasm of breast: Secondary | ICD-10-CM

## 2017-08-29 ENCOUNTER — Encounter (HOSPITAL_COMMUNITY): Payer: Self-pay | Admitting: *Deleted

## 2017-08-29 ENCOUNTER — Emergency Department (HOSPITAL_COMMUNITY)
Admission: EM | Admit: 2017-08-29 | Discharge: 2017-08-29 | Disposition: A | Discharge status: Home / Self Care | Payer: Self-pay | Attending: Emergency Medicine | Admitting: Emergency Medicine

## 2017-08-29 ENCOUNTER — Emergency Department (HOSPITAL_COMMUNITY): Payer: Self-pay

## 2017-08-29 ENCOUNTER — Other Ambulatory Visit: Payer: Self-pay

## 2017-08-29 DIAGNOSIS — I129 Hypertensive chronic kidney disease with stage 1 through stage 4 chronic kidney disease, or unspecified chronic kidney disease: Secondary | ICD-10-CM | POA: Insufficient documentation

## 2017-08-29 DIAGNOSIS — Z7982 Long term (current) use of aspirin: Secondary | ICD-10-CM | POA: Insufficient documentation

## 2017-08-29 DIAGNOSIS — N183 Chronic kidney disease, stage 3 (moderate): Secondary | ICD-10-CM | POA: Insufficient documentation

## 2017-08-29 DIAGNOSIS — E039 Hypothyroidism, unspecified: Secondary | ICD-10-CM | POA: Insufficient documentation

## 2017-08-29 DIAGNOSIS — K5792 Diverticulitis of intestine, part unspecified, without perforation or abscess without bleeding: Secondary | ICD-10-CM | POA: Insufficient documentation

## 2017-08-29 DIAGNOSIS — Z79899 Other long term (current) drug therapy: Secondary | ICD-10-CM | POA: Insufficient documentation

## 2017-08-29 HISTORY — DX: Diverticulitis of intestine, part unspecified, without perforation or abscess without bleeding: K57.92

## 2017-08-29 LAB — CBC
HEMATOCRIT: 41.4 % (ref 36.0–46.0)
HEMOGLOBIN: 13.6 g/dL (ref 12.0–15.0)
MCH: 30.1 pg (ref 26.0–34.0)
MCHC: 32.9 g/dL (ref 30.0–36.0)
MCV: 91.6 fL (ref 78.0–100.0)
Platelets: 278 10*3/uL (ref 150–400)
RBC: 4.52 MIL/uL (ref 3.87–5.11)
RDW: 13.7 % (ref 11.5–15.5)
WBC: 16 10*3/uL — ABNORMAL HIGH (ref 4.0–10.5)

## 2017-08-29 LAB — URINALYSIS, ROUTINE W REFLEX MICROSCOPIC
Bilirubin Urine: NEGATIVE
Glucose, UA: NEGATIVE mg/dL
HGB URINE DIPSTICK: NEGATIVE
Ketones, ur: NEGATIVE mg/dL
Leukocytes, UA: NEGATIVE
NITRITE: NEGATIVE
PROTEIN: 30 mg/dL — AB
SPECIFIC GRAVITY, URINE: 1.014 (ref 1.005–1.030)
pH: 5 (ref 5.0–8.0)

## 2017-08-29 LAB — COMPREHENSIVE METABOLIC PANEL
ALBUMIN: 3.7 g/dL (ref 3.5–5.0)
ALK PHOS: 119 U/L (ref 38–126)
ALT: 22 U/L (ref 14–54)
AST: 24 U/L (ref 15–41)
Anion gap: 10 (ref 5–15)
BILIRUBIN TOTAL: 0.7 mg/dL (ref 0.3–1.2)
BUN: 23 mg/dL — AB (ref 6–20)
CO2: 20 mmol/L — AB (ref 22–32)
Calcium: 8.8 mg/dL — ABNORMAL LOW (ref 8.9–10.3)
Chloride: 108 mmol/L (ref 101–111)
Creatinine, Ser: 1.4 mg/dL — ABNORMAL HIGH (ref 0.44–1.00)
GFR calc Af Amer: 49 mL/min — ABNORMAL LOW (ref >60)
GFR calc non Af Amer: 42 mL/min — ABNORMAL LOW (ref >60)
GLUCOSE: 102 mg/dL — AB (ref 65–99)
Potassium: 4.2 mmol/L (ref 3.5–5.1)
SODIUM: 138 mmol/L (ref 135–145)
TOTAL PROTEIN: 7.6 g/dL (ref 6.5–8.1)

## 2017-08-29 LAB — LIPASE, BLOOD: LIPASE: 23 U/L (ref 11–51)

## 2017-08-29 MED ORDER — ONDANSETRON HCL 4 MG/2ML IJ SOLN
4.0000 mg | Freq: Once | INTRAMUSCULAR | Status: AC
  Administered 2017-08-29: 4 mg via INTRAVENOUS
  Filled 2017-08-29: qty 2

## 2017-08-29 MED ORDER — CIPROFLOXACIN HCL 500 MG PO TABS: 500.0000 mg | ORAL_TABLET | Freq: Two times a day (BID) | ORAL | 0 refills | Status: DC

## 2017-08-29 MED ORDER — CIPROFLOXACIN IN D5W 400 MG/200ML IV SOLN
400.0000 mg | Freq: Once | INTRAVENOUS | Status: AC
  Administered 2017-08-29: 400 mg via INTRAVENOUS
  Filled 2017-08-29: qty 200

## 2017-08-29 MED ORDER — SODIUM CHLORIDE 0.9 % IV BOLUS (SEPSIS)
500.0000 mL | Freq: Once | INTRAVENOUS | Status: AC
  Administered 2017-08-29: 500 mL via INTRAVENOUS

## 2017-08-29 MED ORDER — METRONIDAZOLE IN NACL 5-0.79 MG/ML-% IV SOLN
500.0000 mg | Freq: Once | INTRAVENOUS | Status: AC
  Administered 2017-08-29: 500 mg via INTRAVENOUS
  Filled 2017-08-29: qty 100

## 2017-08-29 MED ORDER — MORPHINE SULFATE (PF) 4 MG/ML IV SOLN
4.0000 mg | Freq: Once | INTRAVENOUS | Status: AC
  Administered 2017-08-29: 4 mg via INTRAVENOUS
  Filled 2017-08-29: qty 1

## 2017-08-29 MED ORDER — SODIUM CHLORIDE 0.9 % IV BOLUS (SEPSIS)
1000.0000 mL | Freq: Once | INTRAVENOUS | Status: AC
  Administered 2017-08-29: 1000 mL via INTRAVENOUS

## 2017-08-29 MED ORDER — METRONIDAZOLE 500 MG PO TABS: 500.0000 mg | ORAL_TABLET | Freq: Four times a day (QID) | ORAL | 0 refills | Status: DC

## 2017-08-29 MED ORDER — ONDANSETRON 8 MG PO TBDP: 8.0000 mg | ORAL_TABLET | Freq: Three times a day (TID) | ORAL | 0 refills | Status: DC | PRN

## 2017-08-29 NOTE — ED Triage Notes (Signed)
Pt states LLQ pain since yesterday.  Diarrhea x 1 yesterday.

## 2017-08-29 NOTE — Discharge Instructions (Addendum)
It was our pleasure to provide your ER care today - we hope that you feel better.  Take antibiotics (cipro and flagyl) as prescribed.  Take acetaminophen and/or ibuprofen as need for pain.  Take zofran as need for nausea.  Follow up with primary care doctor in 1 week if symptoms fail to improve/resolve.  Return to ER if worse, new symptoms, persistent vomiting, other concern.

## 2017-08-29 NOTE — ED Notes (Signed)
Patient transported to CT 

## 2017-08-29 NOTE — ED Notes (Signed)
Ginger Ale given to pt .

## 2017-08-29 NOTE — ED Notes (Signed)
IV team at bedside 

## 2017-08-29 NOTE — ED Notes (Signed)
Unable to obtain IV with 2 attempts ordered IV team to attempt. Patient stated she is a difficult to obtain an IV.

## 2017-08-29 NOTE — ED Provider Notes (Signed)
[facility] EMERGENCY DEPARTMENT Provider Note   CSN: [id number] Arrival date & time: 08/29/17  9323     History   Chief Complaint Chief Complaint  Patient presents with  . Abdominal Pain    HPI [name] is a 56 y.o. female.  Patient w hx diverticula, c/o LLQ abd pain in the past 2 days. Pain constant, dull, moderate, non radiating, worse w palpation. Denies back/flank pain. No fever or chills. Nausea. No vomiting. Had loose bm yesterday. No vaginal d/c or bleeding, remote hx hysterectomy.    The history is provided by the patient.  Abdominal Pain   Pertinent negatives include fever, vomiting, dysuria and headaches.    Past Medical History:  Diagnosis Date  . Acute kidney injury ([place]) 05/19/2017  . Acute myofascial strain of lumbosacral region 08/20/2016  . Allergic rhinitis   . Anemia, iron deficiency   . ANEMIA, IRON DEFICIENCY, history of 04/05/2008   Qualifier: Diagnosis of  By: Philbert Riser MD, Manrique    . Anginal pain [facility])    last cardiologist visit 2010 - dx/ed with anxiety instead of cardiac pain  . Arthritis   . Atypical chest pain    Negative cardiolite at [facility] cards 11/07  . BPPV (benign paroxysmal positional vertigo) 01/07/2017  . CHOLECYSTECTOMY, HX OF    Annotation: 2000 Qualifier: Diagnosis of  By: Eddie Dibbles MD, Bhakti    . Chronic left maxillary sinus disease 09/22/2012  . Chronic low back pain 11/08/2015  . Diarrhea 05/19/2017  . Diverticulitis   . Elevated BP   . Family history of leukemia 09/22/2012   She states that Jensen her uncle and his son was diagnosed with Leukemia. She would like to know more information about the leukemia.       . Hepatic steatosis 09/22/2012   Incidental finding on abd CT in 2011   . History of blood transfusion 03/2008   Requried 2u prbc; menometrorrhagia, uterine fibroids.  Marland Kitchen HTN (hypertension) 02/09/2014  . Hypertension   . Hypertriglyceridemia    164 - 2/11  . Hypothyroidism   . Injury of left  rotator cuff 12/02/2016  . Insomnia   . INSOMNIA UNSPECIFIED 05/01/2010   Qualifier: Diagnosis of  By: Shon Baton, MD, Janett Billow    . Metabolic syndrome 55/73/2202  . Nausea 05/19/2017  . Nausea vomiting and diarrhea   . 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 Obesity    250 lbs 8/11  . OBESITY 08/31/2006   Annotation: Wt. 248 lbs. 11/2004 Qualifier: Diagnosis of  By: Eddie Dibbles MD, Bhakti    . Panic disorder 11/05  . PANIC DISORDER 08/31/2006   Diagnosed 11/05, well-controlled with Paxil, patient also takes albuterol when she has a panic attack, maybe once/month   . Pectus excavatum 09/22/2012   Incidental finding on CXR in 2013.    Marland Kitchen Perirectal abscess 11/14/2011  . Prediabetes 09/22/2012  . Preventative health care 02/09/2014  . Proteinuria 07/17/2012  . Shortness of breath 05/18/2012   w/exertion"  . Urinary incontinence without sensory awareness 12/17/2016  . Venous (peripheral) insufficiency 11/08/2015    Patient Active Problem List   Diagnosis Date Noted  . Diverticulitis 06/23/2017  . Gastroenteritis 06/18/2017  . CKD (chronic kidney disease) stage 3, GFR 30-59 ml/min (HCC) 05/25/2017  . Diarrhea 05/19/2017  . Nausea 05/19/2017  . Acute kidney injury ([place]) 05/19/2017  . Nausea vomiting and diarrhea   . BPPV (benign paroxysmal positional vertigo) 01/07/2017  . Urinary incontinence without sensory awareness 12/17/2016  . Injury  of left rotator cuff 12/02/2016  . Acute myofascial strain of lumbosacral region 08/20/2016  . Venous (peripheral) insufficiency 11/08/2015  . Chronic low back pain 11/08/2015  . HTN (hypertension) 02/09/2014  . Preventative health care 02/09/2014  . 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 Metabolic syndrome 32/95/1884  . Pectus excavatum 09/22/2012  . Hepatic steatosis 09/22/2012  . Chronic left maxillary sinus disease 09/22/2012  . Prediabetes 09/22/2012  . Family history of leukemia 09/22/2012  . Proteinuria 07/17/2012  . INSOMNIA UNSPECIFIED 05/01/2010  .  Hypothyroidism 04/05/2008  . ANEMIA, IRON DEFICIENCY, history of 04/05/2008  . Hypertriglyceridemia 08/31/2006  . OBESITY 08/31/2006  . PANIC DISORDER 08/31/2006  . CHOLECYSTECTOMY, HX OF 08/31/2006    Past Surgical History:  Procedure Laterality Date  . ABDOMINAL HYSTERECTOMY  04/2007   total abdominal hysterectomy ( cervix) with bilateral salpingo-oopherectomy  . CHOLECYSTECTOMY  07/30/1999   Diagnosis of  By: Eddie Dibbles MD, Bhakti    . DILATION AND CURETTAGE OF UTERUS  1987  . EXPLORATION AND REPAIR LEFT FOREARM Left 06/11/2013   Performed by Charlotte Crumb, MD at [place]  . INCISION AND DRAINAGE ABSCESS N/A 11/02/2012   Performed by Odis Hollingshead, MD at [place]  . INCISION AND DRAINAGE PERIRECTAL ABSCESS  11/14/2011   Procedure: IRRIGATION AND DEBRIDEMENT PERIRECTAL ABSCESS;  Surgeon: Zenovia Jarred, MD;  Location: [facility];  Service: General;  Laterality: N/A;  . IRRIGATION AND DEBRIDEMENT PERIRECTAL ABSCESS N/A 11/14/2011   Performed by Zenovia Jarred, MD at [place]  . LEFT SHOULDER ARTHROSCOPY WITH ROTATOR CUFF REPAIR AND SUBACROMIAL DECOMPRESSION, DISTAL CLAVICLE EXCISION Left 03/06/2016   Performed by Leandrew Koyanagi, MD at [facility] OR    OB History    No data available       Home Medications    Prior to Admission medications   Medication Sig Start Date End Date Taking? Authorizing Provider  amLODipine (NORVASC) 10 MG tablet Take 1 tablet by mouth once daily for blood pressure. 07/20/17  Yes [name], NP  aspirin 325 MG tablet Take 325 mg daily by mouth.   Yes [provider]  buPROPion (WELLBUTRIN XL) 300 MG 24 hr tablet Take 300 mg by mouth daily.   Yes [provider]  hydrochlorothiazide (HYDRODIURIL) 25 MG tablet Take 1 tablet (25 mg total) by mouth daily. 07/20/17  Yes [name], NP  levothyroxine (SYNTHROID) 88 MCG tablet Take 1 tablet by mouth every morning on an empty stomach with a full glass of water. 07/20/17 07/20/18 Yes [name], NP  lisinopril (PRINIVIL,ZESTRIL) 30 MG tablet Take 1 tablet (30 mg total) by mouth daily. 02/13/17  Yes Zada Finders, MD  PARoxetine (PAXIL) 20 MG tablet Take 1.5 tablets (30 mg total) by mouth daily. 07/20/17  Yes [name], NP  predniSONE (DELTASONE) 10 MG tablet Take 4 tablets for three days, then 3 tablets for two days, then 2 tablets for two days, then 1 tablet for two days. 08/25/17  Yes [name], NP  traZODone (DESYREL) 100 MG tablet Take 100 mg by mouth at bedtime.   Yes [provider]  acetaminophen (TYLENOL) 500 MG tablet Take 1,000 mg by mouth every 8 (eight) hours as needed for mild pain.    [provider]  permethrin (ELIMITE) 5 % cream Apply 1/2 of the tube to entire body once from neck to toes. Patient not taking: Reported on 08/29/2017 07/20/17   [name], NP  Family History Family History  Problem Relation Age of Onset  . Heart attack Mother        at the age of 77's  . Lung cancer Mother        metestatic, + smoker  . Aneurysm Mother   . Hyperlipidemia Mother   . Hypertension Mother   . Hypertension Father   . Heart attack Maternal Grandmother        at the age of 42's    Social History Social History   Tobacco Use  . Smoking status: Never Smoker  . Smokeless tobacco: Never Used  Substance Use Topics  . Alcohol use: No    Alcohol/week: 0.0 oz  . Drug use: No     Allergies   Cephalexin; Doxycycline; Dilaudid [hydromorphone hcl]; and Sulfur   Review of Systems Review of Systems  Constitutional: Negative for fever.  HENT: Negative for sore throat.   Eyes: Negative for redness.  Respiratory: Negative for shortness of breath.   Cardiovascular: Negative for chest pain.  Gastrointestinal: Positive for abdominal pain. Negative for vomiting.  Genitourinary: Negative for dysuria and flank pain.  Musculoskeletal: Negative for back pain.  Skin: Negative for rash.  Neurological: Negative for headaches.    Hematological: Does not bruise/bleed easily.  Psychiatric/Behavioral: Negative for confusion.     Physical Exam Updated Vital Signs BP (!) 144/96 (BP Location: Left Arm)   Pulse (!) 102   Temp 98 F (36.7 C) (Oral)   Resp 18   Ht 1.549 m (5\' 1" )   Wt 118.4 kg (261 lb)   LMP 06/17/2007   SpO2 100%   BMI 49.32 kg/m   Physical Exam  Constitutional: She appears well-developed and well-nourished. No distress.  HENT:  Head: Atraumatic.  Eyes: Conjunctivae are normal. No scleral icterus.  Neck: Neck supple. No tracheal deviation present.  Cardiovascular: Normal rate.  Pulmonary/Chest: Effort normal. No respiratory distress.  Abdominal: Soft. Normal appearance and bowel sounds are normal. She exhibits no distension. There is tenderness.  Obese. LLQ tenderness. No peritoneal signs.   Genitourinary:  Genitourinary Comments: No cva tenderness  Musculoskeletal: She exhibits no edema.  Neurological: She is alert.  Skin: Skin is warm and dry. No rash noted.  Psychiatric: She has a normal mood and affect.  Nursing note and vitals reviewed.    ED Treatments / Results  Labs (all labs ordered are listed, but only abnormal results are displayed) Results for orders placed or performed during the hospital encounter of 08/29/17  Lipase, blood  Result Value Ref Range   Lipase 23 11 - 51 U/L  Comprehensive metabolic panel  Result Value Ref Range   Sodium 138 135 - 145 mmol/L   Potassium 4.2 3.5 - 5.1 mmol/L   Chloride 108 101 - 111 mmol/L   CO2 20 (L) 22 - 32 mmol/L   Glucose, Bld 102 (H) 65 - 99 mg/dL   BUN 23 (H) 6 - 20 mg/dL   Creatinine, Ser 1.40 (H) 0.44 - 1.00 mg/dL   Calcium 8.8 (L) 8.9 - 10.3 mg/dL   Total Protein 7.6 6.5 - 8.1 g/dL   Albumin 3.7 3.5 - 5.0 g/dL   AST 24 15 - 41 U/L   ALT 22 14 - 54 U/L   Alkaline Phosphatase 119 38 - 126 U/L   Total Bilirubin 0.7 0.3 - 1.2 mg/dL   GFR calc non Af Amer 42 (L) >60 mL/min   GFR calc Af Amer 49 (L) >60 mL/min   Anion gap  10 5 - 15  CBC  Result Value Ref Range   WBC 16.0 (H) 4.0 - 10.5 K/uL   RBC 4.52 3.87 - 5.11 MIL/uL   Hemoglobin 13.6 12.0 - 15.0 g/dL   HCT 41.4 36.0 - 46.0 %   MCV 91.6 78.0 - 100.0 fL   MCH 30.1 26.0 - 34.0 pg   MCHC 32.9 30.0 - 36.0 g/dL   RDW 13.7 11.5 - 15.5 %   Platelets 278 150 - 400 K/uL  Urinalysis, Routine w reflex microscopic  Result Value Ref Range   Color, Urine YELLOW YELLOW   APPearance CLEAR CLEAR   Specific Gravity, Urine 1.014 1.005 - 1.030   pH 5.0 5.0 - 8.0   Glucose, UA NEGATIVE NEGATIVE mg/dL   Hgb urine dipstick NEGATIVE NEGATIVE   Bilirubin Urine NEGATIVE NEGATIVE   Ketones, ur NEGATIVE NEGATIVE mg/dL   Protein, ur 30 (A) NEGATIVE mg/dL   Nitrite NEGATIVE NEGATIVE   Leukocytes, UA NEGATIVE NEGATIVE   RBC / HPF 0-5 0 - 5 RBC/hpf   WBC, UA 0-5 0 - 5 WBC/hpf   Bacteria, UA RARE (A) NONE SEEN   Squamous Epithelial / LPF 0-5 (A) NONE SEEN   Mucus PRESENT    EKG  EKG Interpretation None       Radiology Ct Abdomen Pelvis Wo Contrast  Result Date: 08/29/2017 CLINICAL DATA:  Left lower quadrant pain and diarrhea. EXAM: CT ABDOMEN AND PELVIS WITHOUT CONTRAST TECHNIQUE: Multidetector CT imaging of the abdomen and pelvis was performed following the standard protocol without IV contrast. COMPARISON:  06/01/2017 FINDINGS: Lower chest:  Unremarkable para Hepatobiliary: The liver shows diffusely decreased attenuation suggesting steatosis. Gallbladder surgically absent. No intrahepatic or extrahepatic biliary dilation. Pancreas: No focal mass lesion. No dilatation of the main duct. No intraparenchymal cyst. No peripancreatic edema. Spleen: No splenomegaly. No focal mass lesion. Adrenals/Urinary Tract: No adrenal nodule or mass. Cortical scarring in Jensen kidneys is similar to prior. No evidence for hydroureter. The urinary bladder appears normal for the degree of distention. Stomach/Bowel: Stomach is nondistended. No gastric wall thickening. No evidence of outlet  obstruction. Duodenum is normally positioned as is the ligament of Treitz. No small bowel wall thickening. No small bowel dilatation. The terminal ileum is normal. The appendix is normal. Diverticular changes noted left colon. 5 cm segment of mid sigmoid colon shows circumferential wall thickening with pericolonic edema/inflammation. Edema/ inflammation tracks in the sigmoid mesocolon. No evidence for extraluminal gas or abscess at this time. Vascular/Lymphatic: No abdominal aortic aneurysm. No abdominal aortic atherosclerotic calcification. There is no gastrohepatic or hepatoduodenal ligament lymphadenopathy. No intraperitoneal or retroperitoneal lymphadenopathy. No pelvic sidewall lymphadenopathy. Reproductive: Uterus surgically absent.  There is no adnexal mass. Other: No substantial intraperitoneal free fluid Musculoskeletal: Bone windows reveal no worrisome lytic or sclerotic osseous lesions. IMPRESSION: 1. Wall thickening with pericolonic edema/ inflammation involving the mid sigmoid colon. Imaging features most suggestive of sigmoid diverticulitis without perforation or abscess. 2. Bilateral renal cortical scarring, similar to prior. 3. Hepatic steatosis. Electronically Signed   By: Misty Stanley M.D.   On: 08/29/2017 12:55    Procedures Procedures (including critical care time)  Medications Ordered in ED Medications  sodium chloride 0.9 % bolus 1,000 mL (not administered)  morphine 4 MG/ML injection 4 mg (not administered)  ondansetron (ZOFRAN) injection 4 mg (not administered)     Initial Impression / Assessment and Plan / ED Course  I have reviewed the triage vital signs and the nursing notes.  Pertinent labs &  imaging results that were available during my care of the patient were reviewed by me and considered in my medical decision making (see chart for details).  Iv ns bolus. Labs sent.   CT imaging ordered.  Morphine iv. zofran iv.  Additional ivf.   Reviewed nursing notes and  prior charts for additional history.   cipro and flagyl iv.  Recheck pain improved. No recurrent nv.   Pt appears stable for d/c.   rx for home provided.     Final Clinical Impressions(s) / ED Diagnoses   Final diagnoses:  None    ED Discharge Orders    None       Lajean Saver, MD 08/29/17 1322

## 2017-09-08 ENCOUNTER — Ambulatory Visit: Payer: No Typology Code available for payment source | Admitting: Primary Care

## 2017-09-17 ENCOUNTER — Ambulatory Visit (INDEPENDENT_AMBULATORY_CARE_PROVIDER_SITE_OTHER): Payer: Self-pay | Admitting: Primary Care

## 2017-09-17 ENCOUNTER — Encounter: Payer: Self-pay | Admitting: Primary Care

## 2017-09-17 ENCOUNTER — Encounter: Payer: Self-pay | Admitting: Gastroenterology

## 2017-09-17 VITALS — BP 126/76 | HR 78 | Temp 97.7°F | Ht 60.5 in | Wt 256.0 lb

## 2017-09-17 DIAGNOSIS — K5792 Diverticulitis of intestine, part unspecified, without perforation or abscess without bleeding: Secondary | ICD-10-CM

## 2017-09-17 NOTE — Patient Instructions (Signed)
You will be contacted regarding your referral to GI for the colonoscopy.  Please let us know if you have not heard back within one week.   Continue the clear liquid diet for the next 3-4 days if possible. Please notify me if your symptoms progress.  It was a pleasure to see you today!   Clear Liquid Diet, Adult A clear liquid diet is a diet that includes only liquids that you can see through. You may need to follow a clear liquid diet if:  You develop a medical condition right before or after you have surgery.  You were not able to eat food for a long period of time.  You had a condition that gave you diarrhea.  You are going to have an exam, such as a colonoscopy, in which instruments will be put into your body to look at parts of your digestive system.  You are going to have bowel surgery.  The usual goals of this diet are:  To rest the stomach and digestive system as much as possible.  To keep you hydrated.  To make sure you get some calories for energy.  To help you return to normal digestion.  Most people need to follow this diet for only a short period of time. What do I need to know about this diet?  A clear liquid is a liquid that you can see through when you hold it up to a light.  A clear liquid diet does not provide all the nutrients that you need. It is important to choose a variety of the liquids that are allowed on this diet. That way, you will get as many nutrients as possible.  If you are not sure whether you can have certain items, ask your health care provider. What can I have?  Water and flavored water.  Fruit juices that do not have pulp, such as cranberry juice and apple juice.  Tea and coffee without milk or cream.  Clear bouillon or broth.  Broth-based soups that have been strained.  Flavored gelatins.  Honey.  Sugar water.  Frozen ice or frozen ice pops that do not contain milk, yogurt, fruit pieces, or fruit pulp.  Clear  sodas.  Clear sports drinks. The items listed above may not be a complete list of recommended liquids. Contact your dietitian for more options. What can I not have?  Juices that have pulp.  Milk.  Cream or cream-based soups.  Yogurt. The items listed above may not be a complete list of liquids to avoid. Contact your dietitian for more information. Summary  A clear liquid diet is a diet that includes only liquids that you can see through.  The goal of this diet is to help you recover by resting your digestive system, keeping you hydrated, and providing nutrients.  Make sure to avoid liquids with milk, cream, or pulp while on this diet. This information is not intended to replace advice given to you by your health care provider. Make sure you discuss any questions you have with your health care provider. Document Released: 09/28/2005 Document Revised: 05/12/2016 Document Reviewed: 08/25/2013 Elsevier Interactive Patient Education  [employer].

## 2017-09-17 NOTE — Progress Notes (Signed)
Subjective:    Patient ID: [name], female    DOB: 1961-07-01, 56 y.o.   MRN: [id number]  HPI  [name] is a 56 year old female with a history of gastroenteritis, recurrent diverticulitis, cholecystectomy who presents today with a chief complaint of abdominal pain.  Her last episode of diverticulitis was on 08/29/17 at the emergency department. She endorsed a two day history of LLQ abdominal pain, underwent CT scan which showed mid sigmoid diverticulitis. She was treated with IV Cipro and Flagyl and was discharged later that day. She was provided with oral Cipro and Flagyl for 10 day course.  Since her visit she's experience intermittent LLQ abdominal pain. She completed her 10 day course of antibiotics. When she experiences the pain she'll go on a 2-3 day liquid diet with improvement. Symptoms will return within 3-4 days after eating a normal diet. This has occurred 2-3 times since her ED visit on 08/29/17. Today her pain is mild, last night pain was worse. She started a clear liquid diet yesterday and is feeling better.   She denies fevers, rectal bleeding, vomiting. She's been taking Zofran for nausea with improvement.   Review of Systems  Constitutional: Negative for fever.  Gastrointestinal: Positive for abdominal pain, diarrhea and nausea. Negative for blood in stool, constipation and vomiting.       Past Medical History:  Diagnosis Date  . Acute kidney injury ([place]) 05/19/2017  . Acute myofascial strain of lumbosacral region 08/20/2016  . Allergic rhinitis   . Anemia, iron deficiency   . ANEMIA, IRON DEFICIENCY, history of 04/05/2008   Qualifier: Diagnosis of  By: Philbert Riser MD, Manrique    . Anginal pain [facility])    last cardiologist visit 2010 - dx/ed with anxiety instead of cardiac pain  . Arthritis   . Atypical chest pain    Negative cardiolite at [facility] cards 11/07  . BPPV (benign paroxysmal positional vertigo) 01/07/2017  . CHOLECYSTECTOMY, HX OF    Annotation: 2000  Qualifier: Diagnosis of  By: Eddie Dibbles MD, Bhakti    . Chronic left maxillary sinus disease 09/22/2012  . Chronic low back pain 11/08/2015  . Diarrhea 05/19/2017  . Diverticulitis   . Elevated BP   . Family history of leukemia 09/22/2012   She states that both her uncle and his son was diagnosed with Leukemia. She would like to know more information about the leukemia.       . Hepatic steatosis 09/22/2012   Incidental finding on abd CT in 2011   . History of blood transfusion 03/2008   Requried 2u prbc; menometrorrhagia, uterine fibroids.  Marland Kitchen HTN (hypertension) 02/09/2014  . Hypertension   . Hypertriglyceridemia    164 - 2/11  . Hypothyroidism   . Injury of left rotator cuff 12/02/2016  . Insomnia   . INSOMNIA UNSPECIFIED 05/01/2010   Qualifier: Diagnosis of  By: Shon Baton, MD, Janett Billow    . Metabolic syndrome 78/29/5621  . Nausea 05/19/2017  . Nausea vomiting and diarrhea   . 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 Obesity    250 lbs 8/11  . OBESITY 08/31/2006   Annotation: Wt. 248 lbs. 11/2004 Qualifier: Diagnosis of  By: Eddie Dibbles MD, Bhakti    . Panic disorder 11/05  . PANIC DISORDER 08/31/2006   Diagnosed 11/05, well-controlled with Paxil, patient also takes albuterol when she has a panic attack, maybe once/month   . Pectus excavatum 09/22/2012   Incidental finding on CXR in 2013.    Marland Kitchen Perirectal abscess  11/14/2011  . Prediabetes 09/22/2012  . Preventative health care 02/09/2014  . Proteinuria 07/17/2012  . Shortness of breath 05/18/2012   w/exertion"  . Urinary incontinence without sensory awareness 12/17/2016  . Venous (peripheral) insufficiency 11/08/2015     Social History   Socioeconomic History  . Marital status: Married    Spouse name: Not on file  . Number of children: Not on file  . Years of education: Not on file  . Highest education level: Not on file  Social Needs  . Financial resource strain: Not on file  . Food insecurity - worry: Not on file  . Food insecurity - inability: Not on  file  . Transportation needs - medical: Not on file  . Transportation needs - non-medical: Not on file  Occupational History  . Not on file  Tobacco Use  . Smoking status: Never Smoker  . Smokeless tobacco: Never Used  Substance and Sexual Activity  . Alcohol use: No    Alcohol/week: 0.0 oz  . Drug use: No  . Sexual activity: Not Currently  Other Topics Concern  . Not on file  Social History Narrative   Currently unemployed. Starting college in Aug for accounting.  Married, has 2 children lives with her youngest son.      Past Surgical History:  Procedure Laterality Date  . ABDOMINAL HYSTERECTOMY  04/2007   total abdominal hysterectomy ( cervix) with bilateral salpingo-oopherectomy  . CHOLECYSTECTOMY  07/30/1999   Diagnosis of  By: Eddie Dibbles MD, Bhakti    . DILATION AND CURETTAGE OF UTERUS  1987  . INCISION AND DRAINAGE ABSCESS  11/02/2012   Procedure: INCISION AND DRAINAGE ABSCESS;  Surgeon: Odis Hollingshead, MD;  Location: [place];  Service: General;  Laterality: N/A;  I&D anorectal abscess  . INCISION AND DRAINAGE PERIRECTAL ABSCESS  11/14/2011   Procedure: IRRIGATION AND DEBRIDEMENT PERIRECTAL ABSCESS;  Surgeon: Zenovia Jarred, MD;  Location: [place];  Service: General;  Laterality: N/A;  . NERVE, TENDON AND ARTERY REPAIR Left 06/11/2013   Procedure: EXPLORATION AND REPAIR LEFT FOREARM;  Surgeon: Schuyler Amor, MD;  Location: [place];  Service: Orthopedics;  Laterality: Left;  . SHOULDER ARTHROSCOPY WITH ROTATOR CUFF REPAIR AND SUBACROMIAL DECOMPRESSION Left 03/06/2016   Procedure: LEFT SHOULDER ARTHROSCOPY WITH ROTATOR CUFF REPAIR AND SUBACROMIAL DECOMPRESSION, DISTAL CLAVICLE EXCISION;  Surgeon: Leandrew Koyanagi, MD;  Location: [place];  Service: Orthopedics;  Laterality: Left;    Family History  Problem Relation Age of Onset  . Heart attack Mother        at the age of 63's  . Lung cancer Mother        metestatic, + smoker  . Aneurysm Mother   . Hyperlipidemia Mother   .  Hypertension Mother   . Hypertension Father   . Heart attack Maternal Grandmother        at the age of 6's    Allergies  Allergen Reactions  . Cephalexin Itching  . Doxycycline Nausea And Vomiting  . Dilaudid [Hydromorphone Hcl] Other (See Comments)    Panic attack  . Sulfur Rash    Itching with rash    Current Outpatient Medications on File Prior to Visit  Medication Sig Dispense Refill  . acetaminophen (TYLENOL) 500 MG tablet Take 1,000 mg by mouth every 8 (eight) hours as needed for mild pain.    Marland Kitchen amLODipine (NORVASC) 10 MG tablet Take 1 tablet by mouth once daily for blood pressure. 90 tablet 3  . aspirin 325  MG tablet Take 325 mg daily by mouth.    Marland Kitchen buPROPion (WELLBUTRIN XL) 300 MG 24 hr tablet Take 300 mg by mouth daily.    . hydrochlorothiazide (HYDRODIURIL) 25 MG tablet Take 1 tablet (25 mg total) by mouth daily. 90 tablet 3  . levothyroxine (SYNTHROID) 88 MCG tablet Take 1 tablet by mouth every morning on an empty stomach with a full glass of water. 90 tablet 2  . lisinopril (PRINIVIL,ZESTRIL) 30 MG tablet Take 1 tablet (30 mg total) by mouth daily. 90 tablet 2  . PARoxetine (PAXIL) 20 MG tablet Take 1.5 tablets (30 mg total) by mouth daily. 135 tablet 2  . permethrin (ELIMITE) 5 % cream Apply 1/2 of the tube to entire body once from neck to toes. 60 g 0  . traZODone (DESYREL) 100 MG tablet Take 100 mg by mouth at bedtime.    . ondansetron (ZOFRAN ODT) 8 MG disintegrating tablet Take 1 tablet (8 mg total) every 8 (eight) hours as needed by mouth for nausea or vomiting. (Patient not taking: Reported on 09/17/2017) 10 tablet 0   No current facility-administered medications on file prior to visit.     BP 126/76   Pulse 78   Temp 97.7 F (36.5 C) (Oral)   Ht 5' 0.5" (1.537 m)   Wt 256 lb (116.1 kg)   LMP 06/17/2007   SpO2 98%   BMI 49.17 kg/m    Objective:   Physical Exam  Constitutional: She appears well-nourished.  Neck: Neck supple.  Cardiovascular: Normal  rate and regular rhythm.  Pulmonary/Chest: Effort normal and breath sounds normal.  Abdominal: Soft. Normal appearance and bowel sounds are normal. There is tenderness in the suprapubic area and left lower quadrant.          Assessment & Plan:

## 2017-09-17 NOTE — Assessment & Plan Note (Signed)
Recurrent with last episode being 08/29/17, treated with Cipro and Flagyl. Mild tenderness today on exam. Will have her continue clear liquid, discussed extending for another 3-4 days and resume normal diet as tolerated. Discussed return precautions.  Given recurrent diverticulitis will send to GI for further evaluation.

## 2017-10-04 ENCOUNTER — Encounter: Payer: Self-pay | Admitting: Gastroenterology

## 2017-10-04 ENCOUNTER — Ambulatory Visit (INDEPENDENT_AMBULATORY_CARE_PROVIDER_SITE_OTHER): Payer: No Typology Code available for payment source | Admitting: Gastroenterology

## 2017-10-04 VITALS — BP 132/82 | HR 62 | Ht 61.0 in | Wt 256.0 lb

## 2017-10-04 DIAGNOSIS — Z1211 Encounter for screening for malignant neoplasm of colon: Secondary | ICD-10-CM

## 2017-10-04 DIAGNOSIS — K5732 Diverticulitis of large intestine without perforation or abscess without bleeding: Secondary | ICD-10-CM

## 2017-10-04 NOTE — Patient Instructions (Signed)

## 2017-10-04 NOTE — Progress Notes (Addendum)
10/04/2017 [name] [id number] August 04, 1961   HISTORY OF PRESENT ILLNESS:  This is a 56 year old female who is new to our practice and was referred here by her PCP, Paige Friendly, NP, for evaluation regarding recurrent diverticulitis.  She apparently has been treated 4 times for this since August, last was 08/29/17 for 10 days with cipro and flagyl.  Has always responded well to antibiotics.  Had CT scan abdomen and pelvis with contrast in August that confirmed diverticulitis in the left colon.  Had CT scan abdomen and pelvis without contrast in November that suggested diverticulitis in the sigmoid colon.  Feels well now.  No pain.  Says that her mom had issues with recurrent diverticulitis and had to have surgery as well.  Patient has never undergone colonoscopy in the past..  Denies seeing blood in her stool.  Sometimes alternates between constipation and loose stools, but nothing too extreme.   Past Medical History:  Diagnosis Date  . Acute kidney injury ([place]) 05/19/2017  . Acute myofascial strain of lumbosacral region 08/20/2016  . Allergic rhinitis   . Anemia, iron deficiency   . ANEMIA, IRON DEFICIENCY, history of 04/05/2008   Qualifier: Diagnosis of  By: Paige Riser MD, Manrique    . Anginal pain [facility])    last cardiologist visit 2010 - dx/ed with anxiety instead of cardiac pain  . Arthritis   . Atypical chest pain    Negative cardiolite at [facility] cards 11/07  . BPPV (benign paroxysmal positional vertigo) 01/07/2017  . CHOLECYSTECTOMY, HX OF    Annotation: 2000 Qualifier: Diagnosis of  By: Paige Dibbles MD, Bhakti    . Chronic left maxillary sinus disease 09/22/2012  . Chronic low back pain 11/08/2015  . Diarrhea 05/19/2017  . Diverticulitis   . Elevated BP   . Family history of leukemia 09/22/2012   She states that both her uncle and his son was diagnosed with Leukemia. She would like to know more information about the leukemia.       . Hepatic steatosis 09/22/2012   Incidental  finding on abd CT in 2011   . History of blood transfusion 03/2008   Requried 2u prbc; menometrorrhagia, uterine fibroids.  Marland Kitchen HTN (hypertension) 02/09/2014  . Hypertension   . Hypertriglyceridemia    164 - 2/11  . Hypothyroidism   . Injury of left rotator cuff 12/02/2016  . Insomnia   . INSOMNIA UNSPECIFIED 05/01/2010   Qualifier: Diagnosis of  By: Shon Baton, MD, Janett Billow    . Metabolic syndrome 97/35/3299  . Nausea 05/19/2017  . Nausea vomiting and diarrhea   . 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 Obesity    250 lbs 8/11  . OBESITY 08/31/2006   Annotation: Wt. 248 lbs. 11/2004 Qualifier: Diagnosis of  By: Paige Dibbles MD, Bhakti    . Panic disorder 11/05  . PANIC DISORDER 08/31/2006   Diagnosed 11/05, well-controlled with Paxil, patient also takes albuterol when she has a panic attack, maybe once/month   . Pectus excavatum 09/22/2012   Incidental finding on CXR in 2013.    Marland Kitchen Perirectal abscess 11/14/2011  . Prediabetes 09/22/2012  . Preventative health care 02/09/2014  . Proteinuria 07/17/2012  . Shortness of breath 05/18/2012   w/exertion"  . Urinary incontinence without sensory awareness 12/17/2016  . Venous (peripheral) insufficiency 11/08/2015   Past Surgical History:  Procedure Laterality Date  . ABDOMINAL HYSTERECTOMY  04/2007   total abdominal hysterectomy ( cervix) with bilateral salpingo-oopherectomy  . CHOLECYSTECTOMY  07/30/1999  Diagnosis of  By: Paige Dibbles MD, Bhakti    . DILATION AND CURETTAGE OF UTERUS  1987  . INCISION AND DRAINAGE ABSCESS  11/02/2012   Procedure: INCISION AND DRAINAGE ABSCESS;  Surgeon: Paige Hollingshead, MD;  Location: [place];  Service: General;  Laterality: N/A;  I&D anorectal abscess  . INCISION AND DRAINAGE PERIRECTAL ABSCESS  11/14/2011   Procedure: IRRIGATION AND DEBRIDEMENT PERIRECTAL ABSCESS;  Surgeon: Paige Jarred, MD;  Location: [place];  Service: General;  Laterality: N/A;  . NERVE, TENDON AND ARTERY REPAIR Left 06/11/2013   Procedure: EXPLORATION AND REPAIR LEFT  FOREARM;  Surgeon: Paige Amor, MD;  Location: [place];  Service: Orthopedics;  Laterality: Left;  . SHOULDER ARTHROSCOPY WITH ROTATOR CUFF REPAIR AND SUBACROMIAL DECOMPRESSION Left 03/06/2016   Procedure: LEFT SHOULDER ARTHROSCOPY WITH ROTATOR CUFF REPAIR AND SUBACROMIAL DECOMPRESSION, DISTAL CLAVICLE EXCISION;  Surgeon: Leandrew Koyanagi, MD;  Location: [place];  Service: Orthopedics;  Laterality: Left;    reports that  has never smoked. she has never used smokeless tobacco. She reports that she does not drink alcohol or use drugs. family history includes Aneurysm in her mother; Heart attack in her maternal grandmother and mother; Hyperlipidemia in her mother; Hypertension in her father and mother; Lung cancer in her mother. Allergies  Allergen Reactions  . Cephalexin Itching  . Doxycycline Nausea And Vomiting  . Dilaudid [Hydromorphone Hcl] Other (See Comments)    Panic attack  . Sulfur Rash    Itching with rash      Outpatient Encounter Medications as of 10/04/2017  Medication Sig  . acetaminophen (TYLENOL) 500 MG tablet Take 1,000 mg by mouth every 8 (eight) hours as needed for mild pain.  Marland Kitchen amLODipine (NORVASC) 10 MG tablet Take 1 tablet by mouth once daily for blood pressure.  Marland Kitchen aspirin 325 MG tablet Take 325 mg daily by mouth.  Marland Kitchen buPROPion (WELLBUTRIN XL) 300 MG 24 hr tablet Take 300 mg by mouth daily.  . hydrochlorothiazide (HYDRODIURIL) 25 MG tablet Take 1 tablet (25 mg total) by mouth daily.  Marland Kitchen levothyroxine (SYNTHROID) 88 MCG tablet Take 1 tablet by mouth every morning on an empty stomach with a full glass of water.  Marland Kitchen lisinopril (PRINIVIL,ZESTRIL) 30 MG tablet Take 1 tablet (30 mg total) by mouth daily.  Marland Kitchen PARoxetine (PAXIL) 20 MG tablet Take 1.5 tablets (30 mg total) by mouth daily.  . permethrin (ELIMITE) 5 % cream Apply 1/2 of the tube to entire body once from neck to toes.  . traZODone (DESYREL) 100 MG tablet Take 100 mg by mouth at bedtime.  . [DISCONTINUED] ondansetron  (ZOFRAN ODT) 8 MG disintegrating tablet Take 1 tablet (8 mg total) every 8 (eight) hours as needed by mouth for nausea or vomiting. (Patient not taking: Reported on 09/17/2017)   No facility-administered encounter medications on file as of 10/04/2017.      REVIEW OF SYSTEMS  : All other systems reviewed and negative except where noted in the History of Present Illness.   PHYSICAL EXAM: BP 132/82   Pulse 62   Ht 5\' 1"  (1.549 m)   Wt 256 lb (116.1 kg)   LMP 06/17/2007   BMI 48.37 kg/m  General: Well developed white female in no acute distress Head: Normocephalic and atraumatic Eyes:  Sclerae anicteric, conjunctiva pink. Ears: Normal auditory acuity. Lungs: Clear throughout to auscultation; no increased WOB. Heart: Regular rate and rhythm; no M/R/G. Abdomen: Soft, non-distended.  Normal bowel sounds.  Non-tender. Rectal:  Will be  done at the time of colonoscopy. Musculoskeletal: Symmetrical with no gross deformities  Skin: No lesions on visible extremities Extremities: No edema  Neurological: Alert oriented x 4, grossly non-focal Psychological:  Alert and cooperative. Normal mood and affect  ASSESSMENT AND PLAN: *Screening colonoscopy:  Will schedule with Dr. Carlean Purl. *Recurrent diverticulitis:  Treated 4 times since August, the last was 08/29/17.  Feeling well now.    **The risks, benefits, and alternatives to colonoscopy were discussed with the patient and she consents to proceed.    CC:  [name], NP  Agree with Ms. Alphia Kava management.  Gatha Mayer, MD, [facility]

## 2017-10-22 ENCOUNTER — Ambulatory Visit (AMBULATORY_SURGERY_CENTER): Payer: Self-pay | Admitting: Internal Medicine

## 2017-10-22 ENCOUNTER — Encounter: Payer: Self-pay | Admitting: Internal Medicine

## 2017-10-22 ENCOUNTER — Other Ambulatory Visit: Payer: Self-pay

## 2017-10-22 VITALS — BP 131/74 | HR 65 | Temp 98.2°F | Resp 16 | Ht 61.0 in | Wt 256.0 lb

## 2017-10-22 DIAGNOSIS — K635 Polyp of colon: Secondary | ICD-10-CM

## 2017-10-22 DIAGNOSIS — Z1211 Encounter for screening for malignant neoplasm of colon: Secondary | ICD-10-CM

## 2017-10-22 DIAGNOSIS — D123 Benign neoplasm of transverse colon: Secondary | ICD-10-CM

## 2017-10-22 MED ORDER — SODIUM CHLORIDE 0.9 % IV SOLN: 500.0000 mL | INTRAVENOUS | Status: DC

## 2017-10-22 NOTE — Patient Instructions (Addendum)
I found and removed one small polyp that looks benign.  You have diverticulosis as we know.  I will let you know pathology results and when to have another routine colonoscopy by mail and/or My Chart.  I appreciate the opportunity to care for you. Gatha Mayer, MD, [facility]  *Handouts given to patient on polyps and diverticulosis*  YOU HAD AN ENDOSCOPIC PROCEDURE TODAY AT [facility]:   Refer to the procedure report that was given to you for any specific questions about what was found during the examination.  If the procedure report does not answer your questions, please call your gastroenterologist to clarify.  If you requested that your care partner not be given the details of your procedure findings, then the procedure report has been included in a sealed envelope for you to review at your convenience later.  YOU SHOULD EXPECT: Some feelings of bloating in the abdomen. Passage of more gas than usual.  Walking can help get rid of the air that was put into your GI tract during the procedure and reduce the bloating. If you had a lower endoscopy (such as a colonoscopy or flexible sigmoidoscopy) you may notice spotting of blood in your stool or on the toilet paper. If you underwent a bowel prep for your procedure, you may not have a normal bowel movement for a few days.  Please Note:  You might notice some irritation and congestion in your nose or some drainage.  This is from the oxygen used during your procedure.  There is no need for concern and it should clear up in a day or so.  SYMPTOMS TO REPORT IMMEDIATELY:   Following lower endoscopy (colonoscopy or flexible sigmoidoscopy):  Excessive amounts of blood in the stool  Significant tenderness or worsening of abdominal pains  Swelling of the abdomen that is new, acute  Fever of 100F or higher    For urgent or emergent issues, a gastroenterologist can be reached at any hour by calling [phone].   DIET:   We do recommend a small meal at first, but then you may proceed to your regular diet.  Drink plenty of fluids but you should avoid alcoholic beverages for 24 hours.  ACTIVITY:  You should plan to take it easy for the rest of today and you should NOT DRIVE or use heavy machinery until tomorrow (because of the sedation medicines used during the test).    FOLLOW UP: Our staff will call the number listed on your records the next business day following your procedure to check on you and address any questions or concerns that you may have regarding the information given to you following your procedure. If we do not reach you, we will leave a message.  However, if you are feeling well and you are not experiencing any problems, there is no need to return our call.  We will assume that you have returned to your regular daily activities without incident.  If any biopsies were taken you will be contacted by phone or by letter within the next 1-3 weeks.  Please call us at [phone] if you have not heard about the biopsies in 3 weeks.    SIGNATURES/CONFIDENTIALITY: You and/or your care partner have signed paperwork which will be entered into your electronic medical record.  These signatures attest to the fact that that the information above on your After Visit Summary has been reviewed and is understood.  Full responsibility of the confidentiality of this discharge  information lies with you and/or your care-partner. 

## 2017-10-22 NOTE — Op Note (Signed)
[place] Patient Name: [name] Procedure Date: 10/22/2017 10:39 AM MRN: [id number] Endoscopist: Gatha Mayer , MD Age: 57 Referring MD:  Date of Birth: 1961-09-01 Gender: Female Account #: [id number] Procedure:                Colonoscopy Indications:              Screening for colorectal malignant neoplasm, This                            is the patient's first colonoscopy Medicines:                Propofol per Anesthesia, Monitored Anesthesia Care Procedure:                Pre-Anesthesia Assessment:                           - Prior to the procedure, a History and Physical                            was performed, and patient medications and                            allergies were reviewed. The patient's tolerance of                            previous anesthesia was also reviewed. The risks                            and benefits of the procedure and the sedation                            options and risks were discussed with the patient.                            All questions were answered, and informed consent                            was obtained. Prior Anticoagulants: The patient has                            taken no previous anticoagulant or antiplatelet                            agents. ASA Grade Assessment: III - A patient with                            severe systemic disease. After reviewing the risks                            and benefits, the patient was deemed in                            satisfactory condition to undergo the procedure.  After obtaining informed consent, the colonoscope                            was passed under direct vision. Throughout the                            procedure, the patient's blood pressure, pulse, and                            oxygen saturations were monitored continuously. The                            Colonoscope was introduced through the anus and     advanced to the the cecum, identified by                            appendiceal orifice and ileocecal valve. The                            colonoscopy was performed without difficulty. The                            patient tolerated the procedure well. The quality                            of the bowel preparation was excellent. The                            ileocecal valve, appendiceal orifice, and rectum                            were photographed. The bowel preparation used was                            Miralax. Scope In: 10:52:43 AM Scope Out: 11:02:33 AM Scope Withdrawal Time: 0 hours 7 minutes 31 seconds  Total Procedure Duration: 0 hours 9 minutes 50 seconds  Findings:                 The perianal and digital rectal examinations were                            normal.                           A 6 mm polyp was found in the transverse colon. The                            polyp was sessile. The polyp was removed with a                            cold snare. Resection and retrieval were complete.                            Verification of patient identification for  the                            specimen was done. Estimated blood loss was minimal.                           Multiple small and large-mouthed diverticula were                            found in the sigmoid colon. There was narrowing of                            the colon in association with the diverticular                            opening.                           The exam was otherwise without abnormality on                            direct and retroflexion views. Complications:            No immediate complications. Estimated Blood Loss:     Estimated blood loss was minimal. Impression:               - One 6 mm polyp in the transverse colon, removed                            with a cold snare. Resected and retrieved.                           - Severe diverticulosis in the sigmoid colon. There                             was narrowing of the colon in association with the                            diverticular opening.                           - The examination was otherwise normal on direct                            and retroflexion views. Recommendation:           - Patient has a contact number available for                            emergencies. The signs and symptoms of potential                            delayed complications were discussed with the                            patient. Return to normal activities tomorrow.  Written discharge instructions were provided to the                            patient.                           - Continue present medications.                           - Repeat colonoscopy is recommended. The                            colonoscopy date will be determined after pathology                            results from today's exam become available for                            review.                           - Repeated diverticulitis events warrant                            consideration for segmental colectomy.                           - Resume previous diet. Gatha Mayer, MD 10/22/2017 11:12:12 AM This report has been signed electronically.

## 2017-10-22 NOTE — Progress Notes (Signed)
To recovery, report to RN, VSS. 

## 2017-10-22 NOTE — Progress Notes (Signed)
Patient consents to having a PA student present during her care today.

## 2017-10-22 NOTE — Progress Notes (Signed)
Called to room to assist during endoscopic procedure.  Patient ID and intended procedure confirmed with present staff. Received instructions for my participation in the procedure from the performing physician.  

## 2017-10-25 ENCOUNTER — Telehealth: Payer: Self-pay | Admitting: *Deleted

## 2017-10-25 NOTE — Telephone Encounter (Signed)
  Follow up Call-  Call back number 10/22/2017  Post procedure Call Back phone  # [phone]  Permission to leave phone message Yes  Some recent data might be hidden     Patient questions:  Do you have a fever, pain , or abdominal swelling? No. Pain Score  0 *  Have you tolerated food without any problems? Yes.    Have you been able to return to your normal activities? Yes.    Do you have any questions about your discharge instructions: Diet   No. Medications  No. Follow up visit  No.  Do you have questions or concerns about your Care? No.  Actions: * If pain score is 4 or above: No action needed, pain <4.

## 2017-10-26 ENCOUNTER — Encounter: Payer: Self-pay | Admitting: Internal Medicine

## 2017-10-26 ENCOUNTER — Other Ambulatory Visit: Payer: Self-pay | Admitting: Obstetrics and Gynecology

## 2017-10-26 ENCOUNTER — Encounter: Payer: Self-pay | Admitting: Primary Care

## 2017-10-26 DIAGNOSIS — Z1231 Encounter for screening mammogram for malignant neoplasm of breast: Secondary | ICD-10-CM

## 2017-10-26 DIAGNOSIS — Z860100 Personal history of colon polyps, unspecified: Secondary | ICD-10-CM

## 2017-10-26 HISTORY — DX: Personal history of colon polyps, unspecified: Z86.0100

## 2017-10-26 HISTORY — DX: Personal history of colonic polyps: Z86.010

## 2017-10-26 NOTE — Progress Notes (Signed)
6 mm hyperplastic polyp on path Clinical impression is that of [place] Recall 2024 My Chart letter

## 2017-10-28 ENCOUNTER — Encounter: Payer: Self-pay | Admitting: Gastroenterology

## 2017-10-28 ENCOUNTER — Other Ambulatory Visit: Payer: Self-pay | Admitting: Internal Medicine

## 2017-10-28 MED ORDER — METRONIDAZOLE 500 MG PO TABS: 500.0000 mg | ORAL_TABLET | Freq: Two times a day (BID) | ORAL | 0 refills | Status: DC

## 2017-10-28 MED ORDER — CIPROFLOXACIN HCL 500 MG PO TABS: 500.0000 mg | ORAL_TABLET | Freq: Two times a day (BID) | ORAL | 0 refills | Status: DC

## 2017-11-09 ENCOUNTER — Ambulatory Visit: Payer: No Typology Code available for payment source | Admitting: Primary Care

## 2017-11-16 ENCOUNTER — Encounter: Payer: Self-pay | Admitting: Primary Care

## 2017-11-22 ENCOUNTER — Ambulatory Visit (INDEPENDENT_AMBULATORY_CARE_PROVIDER_SITE_OTHER): Payer: Self-pay | Admitting: Internal Medicine

## 2017-11-22 ENCOUNTER — Encounter: Payer: Self-pay | Admitting: Internal Medicine

## 2017-11-22 VITALS — BP 128/80 | HR 68 | Temp 97.8°F | Wt 257.0 lb

## 2017-11-22 DIAGNOSIS — K5792 Diverticulitis of intestine, part unspecified, without perforation or abscess without bleeding: Secondary | ICD-10-CM

## 2017-11-22 MED ORDER — METRONIDAZOLE 500 MG PO TABS: 500.0000 mg | ORAL_TABLET | Freq: Three times a day (TID) | ORAL | 0 refills | Status: DC

## 2017-11-22 MED ORDER — ONDANSETRON HCL 4 MG PO TABS: 4.0000 mg | ORAL_TABLET | Freq: Three times a day (TID) | ORAL | 0 refills | Status: DC | PRN

## 2017-11-22 MED ORDER — CIPROFLOXACIN HCL 500 MG PO TABS: 500.0000 mg | ORAL_TABLET | Freq: Two times a day (BID) | ORAL | 0 refills | Status: DC

## 2017-11-22 NOTE — Patient Instructions (Signed)

## 2017-11-22 NOTE — Progress Notes (Signed)
Subjective:    Patient ID: [name], female    DOB: 11/21/1960, 57 y.o.   MRN: [id number]  HPI  Pt presents to the clinic today with c/o nausea, vomiting, diarrhea and left lower quadrant pain. This started almost 1 week ago. She reports she is so nauseated, she doesn't want to eat. She describes the abdominal pain as cramping. She has run low grade fevers up to 100.0. She has a history of severe diverticulosis with recurrent diverticulitis. She feels like this is a flare of diverticulitis. She is planning on having surgical resection by GI, but has not set this up yet. She has tried a clear liquid diet with minimal relief.  Review of Systems      Past Medical History:  Diagnosis Date  . Acute kidney injury ([place]) 05/19/2017  . Acute myofascial strain of lumbosacral region 08/20/2016  . Allergic rhinitis   . Anemia, iron deficiency   . ANEMIA, IRON DEFICIENCY, history of 04/05/2008   Qualifier: Diagnosis of  By: Paige Riser MD, [name]    . Anginal pain [facility])    last cardiologist visit 2010 - dx/ed with anxiety instead of cardiac pain  . Arthritis   . Atypical chest pain    Negative cardiolite at [facility] cards 11/07  . BPPV (benign paroxysmal positional vertigo) 01/07/2017  . CHOLECYSTECTOMY, HX OF    Annotation: 2000 Qualifier: Diagnosis of  By: Paige Dibbles MD, [name]    . Chronic left maxillary sinus disease 09/22/2012  . Chronic low back pain 11/08/2015  . Depression   . Diarrhea 05/19/2017  . Diverticulitis   . Elevated BP   . Family history of leukemia 09/22/2012   She states that both her uncle and his son was diagnosed with Leukemia. She would like to know more information about the leukemia.       . Hepatic steatosis 09/22/2012   Incidental finding on abd CT in 2011   . History of blood transfusion 03/2008   Requried 2u prbc; menometrorrhagia, uterine fibroids.  Marland Kitchen HTN (hypertension) 02/09/2014  . Hx of colonic polyp 10/26/2017  . Hypertension   . Hypertriglyceridemia    164 -  2/11  . Hypothyroidism   . Injury of left rotator cuff 12/02/2016  . Insomnia   . INSOMNIA UNSPECIFIED 05/01/2010   Qualifier: Diagnosis of  By: Paige Baton, MD, [name]    . Metabolic syndrome 05/39/7673  . Nausea 05/19/2017  . Nausea vomiting and diarrhea   . 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 Obesity    250 lbs 8/11  . OBESITY 08/31/2006   Annotation: Wt. 248 lbs. 11/2004 Qualifier: Diagnosis of  By: Paige Dibbles MD, [name]    . Panic disorder 11/05  . PANIC DISORDER 08/31/2006   Diagnosed 11/05, well-controlled with Paxil, patient also takes albuterol when she has a panic attack, maybe once/month   . Pectus excavatum 09/22/2012   Incidental finding on CXR in 2013.    Marland Kitchen Perirectal abscess 11/14/2011  . Prediabetes 09/22/2012  . Preventative health care 02/09/2014  . Proteinuria 07/17/2012  . Shortness of breath 05/18/2012   w/exertion"  . Urinary incontinence without sensory awareness 12/17/2016  . Venous (peripheral) insufficiency 11/08/2015    Current Outpatient Medications  Medication Sig Dispense Refill  . acetaminophen (TYLENOL) 500 MG tablet Take 1,000 mg by mouth every 8 (eight) hours as needed for mild pain.    Marland Kitchen amLODipine (NORVASC) 10 MG tablet Take 1 tablet by mouth once daily for blood pressure. 90 tablet 3  .  aspirin 325 MG tablet Take 325 mg daily by mouth.    Marland Kitchen buPROPion (WELLBUTRIN XL) 300 MG 24 hr tablet Take 300 mg by mouth daily.    . hydrochlorothiazide (HYDRODIURIL) 25 MG tablet Take 1 tablet (25 mg total) by mouth daily. 90 tablet 3  . levothyroxine (SYNTHROID) 88 MCG tablet Take 1 tablet by mouth every morning on an empty stomach with a full glass of water. 90 tablet 2  . lisinopril (PRINIVIL,ZESTRIL) 30 MG tablet Take 1 tablet (30 mg total) by mouth daily. 90 tablet 2  . PARoxetine (PAXIL) 20 MG tablet Take 1.5 tablets (30 mg total) by mouth daily. 135 tablet 2  . traZODone (DESYREL) 100 MG tablet Take 100 mg by mouth at bedtime.    . ciprofloxacin (CIPRO) 500 MG tablet Take  1 tablet (500 mg total) by mouth 2 (two) times daily. 20 tablet 0  . metroNIDAZOLE (FLAGYL) 500 MG tablet Take 1 tablet (500 mg total) by mouth 3 (three) times daily. 30 tablet 0  . ondansetron (ZOFRAN) 4 MG tablet Take 1 tablet (4 mg total) by mouth every 8 (eight) hours as needed. 20 tablet 0   No current facility-administered medications for this visit.     Allergies  Allergen Reactions  . Cephalexin Itching  . Doxycycline Nausea And Vomiting  . Dilaudid [Hydromorphone Hcl] Other (See Comments)    Panic attack  . Sulfur Rash    Itching with rash    Family History  Problem Relation Age of Onset  . Heart attack Mother        at the age of 39's  . Lung cancer Mother        metestatic, + smoker  . Aneurysm Mother   . Hyperlipidemia Mother   . Hypertension Mother   . Hypertension Father   . Heart attack Maternal Grandmother        at the age of 42's  . Colon cancer Neg Hx   . Stomach cancer Neg Hx   . Esophageal cancer Neg Hx   . Rectal cancer Neg Hx     Social History   Socioeconomic History  . Marital status: Married    Spouse name: Not on file  . Number of children: Not on file  . Years of education: Not on file  . Highest education level: Not on file  Social Needs  . Financial resource strain: Not on file  . Food insecurity - worry: Not on file  . Food insecurity - inability: Not on file  . Transportation needs - medical: Not on file  . Transportation needs - non-medical: Not on file  Occupational History  . Not on file  Tobacco Use  . Smoking status: Never Smoker  . Smokeless tobacco: Never Used  Substance and Sexual Activity  . Alcohol use: No    Alcohol/week: 0.0 oz  . Drug use: No  . Sexual activity: Not Currently  Other Topics Concern  . Not on file  Social History Narrative   Currently unemployed. Starting college in Aug for accounting.  Married, has 2 children lives with her youngest son.       Constitutional:Pt reports fever. Denies malaise,  fatigue, headache or abrupt weight changes.  Gastrointestinal: Pt reports abdominal pain, nausea, vomiting and diarrhea. Denies bloating, constipation, or blood in the stool.   No other specific complaints in a complete review of systems (except as listed in HPI above).  Objective:   Physical Exam   BP 128/80  Pulse 68   Temp 97.8 F (36.6 C) (Oral)   Wt 257 lb (116.6 kg)   LMP 06/17/2007   SpO2 99%   BMI 48.56 kg/m  Wt Readings from Last 3 Encounters:  11/22/17 257 lb (116.6 kg)  10/22/17 256 lb (116.1 kg)  10/04/17 256 lb (116.1 kg)    General: Appears her stated age, obese in NAD. Abdomen: Soft and tender in the LLQ. Normal bowel sounds.    BMET    Component Value Date/Time   NA 138 08/29/2017 0907   NA 140 01/22/2016 1406   K 4.2 08/29/2017 0907   CL 108 08/29/2017 0907   CO2 20 (L) 08/29/2017 0907   GLUCOSE 102 (H) 08/29/2017 0907   BUN 23 (H) 08/29/2017 0907   BUN 18 01/22/2016 1406   CREATININE 1.40 (H) 08/29/2017 0907   CREATININE 1.31 (H) 04/23/2017 1516   CALCIUM 8.8 (L) 08/29/2017 0907   GFRNONAA 42 (L) 08/29/2017 0907   GFRNONAA 59 (L) 04/05/2013 1000   GFRAA 49 (L) 08/29/2017 0907   GFRAA 68 04/05/2013 1000    Lipid Panel     Component Value Date/Time   CHOL 208 (H) 03/12/2017 0947   TRIG 274 (H) 03/12/2017 0947   HDL 34 (L) 03/12/2017 0947   CHOLHDL 6.1 (H) 03/12/2017 0947   CHOLHDL 5.1 07/14/2012 1649   VLDL 51 (H) 07/14/2012 1649   LDLCALC 119 (H) 03/12/2017 0947    CBC    Component Value Date/Time   WBC 16.0 (H) 08/29/2017 0907   RBC 4.52 08/29/2017 0907   HGB 13.6 08/29/2017 0907   HGB 13.2 07/16/2016 1101   HCT 41.4 08/29/2017 0907   HCT 39.0 07/16/2016 1101   PLT 278 08/29/2017 0907   PLT 277 07/16/2016 1101   MCV 91.6 08/29/2017 0907   MCV 90 07/16/2016 1101   MCH 30.1 08/29/2017 0907   MCHC 32.9 08/29/2017 0907   RDW 13.7 08/29/2017 0907   RDW 13.8 07/16/2016 1101   LYMPHSABS 1.2 08/25/2017 1121   MONOABS 0.5  08/25/2017 1121   EOSABS 0.2 08/25/2017 1121   BASOSABS 0.1 08/25/2017 1121    Hgb A1C Lab Results  Component Value Date   HGBA1C 5.7 04/01/2017           Assessment & Plan:   Diverticulitis:  eRx for Zofran 4 mg TID prn eRx for Cipro 500 mg BID x 10 days eRx for Flagyl 500 mg TID x 10 days Clear liquid diet- advance as tolerated Follow up with GI as previously scheduled  Return precautions discussed Webb Silversmith, NP

## 2017-11-23 ENCOUNTER — Other Ambulatory Visit: Payer: Self-pay | Admitting: Primary Care

## 2017-11-23 DIAGNOSIS — I10 Essential (primary) hypertension: Secondary | ICD-10-CM

## 2017-11-23 DIAGNOSIS — E781 Pure hyperglyceridemia: Secondary | ICD-10-CM

## 2017-11-23 DIAGNOSIS — E039 Hypothyroidism, unspecified: Secondary | ICD-10-CM

## 2017-11-23 DIAGNOSIS — R7303 Prediabetes: Secondary | ICD-10-CM

## 2017-11-24 ENCOUNTER — Other Ambulatory Visit: Payer: Self-pay | Admitting: Obstetrics and Gynecology

## 2017-11-24 DIAGNOSIS — Z1231 Encounter for screening mammogram for malignant neoplasm of breast: Secondary | ICD-10-CM

## 2017-11-25 ENCOUNTER — Ambulatory Visit (HOSPITAL_COMMUNITY): Payer: No Typology Code available for payment source

## 2017-12-01 ENCOUNTER — Other Ambulatory Visit: Payer: No Typology Code available for payment source

## 2017-12-01 ENCOUNTER — Other Ambulatory Visit: Payer: Self-pay | Admitting: Nephrology

## 2017-12-01 DIAGNOSIS — N183 Chronic kidney disease, stage 3 unspecified: Secondary | ICD-10-CM

## 2017-12-07 ENCOUNTER — Other Ambulatory Visit (INDEPENDENT_AMBULATORY_CARE_PROVIDER_SITE_OTHER): Payer: Self-pay

## 2017-12-07 DIAGNOSIS — E039 Hypothyroidism, unspecified: Secondary | ICD-10-CM

## 2017-12-07 DIAGNOSIS — E781 Pure hyperglyceridemia: Secondary | ICD-10-CM

## 2017-12-07 DIAGNOSIS — R7303 Prediabetes: Secondary | ICD-10-CM

## 2017-12-07 DIAGNOSIS — I10 Essential (primary) hypertension: Secondary | ICD-10-CM

## 2017-12-07 LAB — COMPREHENSIVE METABOLIC PANEL
ALT: 12 U/L (ref 0–35)
AST: 11 U/L (ref 0–37)
Albumin: 3.5 g/dL (ref 3.5–5.2)
Alkaline Phosphatase: 82 U/L (ref 39–117)
BILIRUBIN TOTAL: 0.5 mg/dL (ref 0.2–1.2)
BUN: 22 mg/dL (ref 6–23)
CALCIUM: 9.2 mg/dL (ref 8.4–10.5)
CHLORIDE: 107 meq/L (ref 96–112)
CO2: 27 meq/L (ref 19–32)
Creatinine, Ser: 1.3 mg/dL — ABNORMAL HIGH (ref 0.40–1.20)
GFR: 45.33 mL/min — AB (ref >60.00)
Glucose, Bld: 108 mg/dL — ABNORMAL HIGH (ref 70–99)
Potassium: 4.1 mEq/L (ref 3.5–5.1)
Sodium: 140 mEq/L (ref 135–145)
Total Protein: 7 g/dL (ref 6.0–8.3)

## 2017-12-07 LAB — LIPID PANEL
CHOL/HDL RATIO: 5
Cholesterol: 176 mg/dL (ref 0–200)
HDL: 35.5 mg/dL — AB (ref >39.00)
LDL Cholesterol: 106 mg/dL — ABNORMAL HIGH (ref 0–99)
NonHDL: 140.99
TRIGLYCERIDES: 176 mg/dL — AB (ref 0.0–149.0)
VLDL: 35.2 mg/dL (ref 0.0–40.0)

## 2017-12-07 LAB — HEMOGLOBIN A1C: Hgb A1c MFr Bld: 5.7 % (ref 4.6–6.5)

## 2017-12-07 LAB — TSH: TSH: 3.48 u[IU]/mL (ref 0.35–4.50)

## 2017-12-08 ENCOUNTER — Encounter: Payer: Self-pay | Admitting: Gastroenterology

## 2017-12-08 ENCOUNTER — Ambulatory Visit (INDEPENDENT_AMBULATORY_CARE_PROVIDER_SITE_OTHER): Payer: Self-pay | Admitting: Gastroenterology

## 2017-12-08 VITALS — BP 124/76 | HR 72 | Ht 61.0 in | Wt 264.6 lb

## 2017-12-08 DIAGNOSIS — R1032 Left lower quadrant pain: Secondary | ICD-10-CM

## 2017-12-08 DIAGNOSIS — K5732 Diverticulitis of large intestine without perforation or abscess without bleeding: Secondary | ICD-10-CM

## 2017-12-08 MED ORDER — METRONIDAZOLE 500 MG PO TABS: 500.0000 mg | ORAL_TABLET | Freq: Three times a day (TID) | ORAL | 0 refills | Status: AC

## 2017-12-08 MED ORDER — CIPROFLOXACIN HCL 500 MG PO TABS: 500.0000 mg | ORAL_TABLET | Freq: Two times a day (BID) | ORAL | 0 refills | Status: DC

## 2017-12-08 NOTE — Progress Notes (Addendum)
12/08/2017 [name] [id number] 08-20-1961   HISTORY OF PRESENT ILLNESS:  This is a 57 year old female who was first seen here by me in 09/2017 for recurrent diverticulitis.  She had never undergone colonoscopy in the past so she had one in 10/2017 that showed one polyp that was removed and was hyperplastic on pathology but Dr. Carlean Purl entered her for a 5 year recall due to clinical impression begin that of a SSP.  Anyway, she also had severe sigmoid diverticulosis with some associated narrowing.  She's had 2 CT scans documenting diverticulitis since August and has been treated at least 4 times.  Is currently on cipro and flagyl for a 10 day course, which she finishes today, but is still having LLQ abdominal pain.  Is improved somewhat but still quite tender.  No fevers, chills, nausea, or vomiting.   Past Medical History:  Diagnosis Date  . Acute kidney injury ([place]) 05/19/2017  . Acute myofascial strain of lumbosacral region 08/20/2016  . Allergic rhinitis   . Anemia, iron deficiency   . ANEMIA, IRON DEFICIENCY, history of 04/05/2008   Qualifier: Diagnosis of  By: Philbert Riser MD, [place]    . Anginal pain [facility])    last cardiologist visit 2010 - dx/ed with anxiety instead of cardiac pain  . Arthritis   . Atypical chest pain    Negative cardiolite at [facility] cards 11/07  . BPPV (benign paroxysmal positional vertigo) 01/07/2017  . CHOLECYSTECTOMY, HX OF    Annotation: 2000 Qualifier: Diagnosis of  By: Eddie Dibbles MD, Bhakti    . Chronic left maxillary sinus disease 09/22/2012  . Chronic low back pain 11/08/2015  . Depression   . Diarrhea 05/19/2017  . Diverticulitis   . Elevated BP   . Family history of leukemia 09/22/2012   She states that both her uncle and his son was diagnosed with Leukemia. She would like to know more information about the leukemia.       . Hepatic steatosis 09/22/2012   Incidental finding on abd CT in 2011   . History of blood transfusion 03/2008   Requried 2u prbc;  menometrorrhagia, uterine fibroids.  Marland Kitchen HTN (hypertension) 02/09/2014  . Hx of colonic polyp 10/26/2017  . Hypertension   . Hypertriglyceridemia    164 - 2/11  . Hypothyroidism   . Injury of left rotator cuff 12/02/2016  . Insomnia   . INSOMNIA UNSPECIFIED 05/01/2010   Qualifier: Diagnosis of  By: Shon Baton, MD, Janett Billow    . Metabolic syndrome 11/94/1740  . Nausea 05/19/2017  . Nausea vomiting and diarrhea   . 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 Obesity    250 lbs 8/11  . OBESITY 08/31/2006   Annotation: Wt. 248 lbs. 11/2004 Qualifier: Diagnosis of  By: Eddie Dibbles MD, Bhakti    . Panic disorder 11/05  . PANIC DISORDER 08/31/2006   Diagnosed 11/05, well-controlled with Paxil, patient also takes albuterol when she has a panic attack, maybe once/month   . Pectus excavatum 09/22/2012   Incidental finding on CXR in 2013.    Marland Kitchen Perirectal abscess 11/14/2011  . Prediabetes 09/22/2012  . Preventative health care 02/09/2014  . Proteinuria 07/17/2012  . Shortness of breath 05/18/2012   w/exertion"  . Urinary incontinence without sensory awareness 12/17/2016  . Venous (peripheral) insufficiency 11/08/2015   Past Surgical History:  Procedure Laterality Date  . ABDOMINAL HYSTERECTOMY  04/2007   total abdominal hysterectomy ( cervix) with bilateral salpingo-oopherectomy  . CHOLECYSTECTOMY  07/30/1999   Diagnosis  of  By: Eddie Dibbles MD, Bhakti    . DILATION AND CURETTAGE OF UTERUS  1987  . INCISION AND DRAINAGE ABSCESS  11/02/2012   Procedure: INCISION AND DRAINAGE ABSCESS;  Surgeon: Odis Hollingshead, MD;  Location: [place];  Service: General;  Laterality: N/A;  I&D anorectal abscess  . INCISION AND DRAINAGE PERIRECTAL ABSCESS  11/14/2011   Procedure: IRRIGATION AND DEBRIDEMENT PERIRECTAL ABSCESS;  Surgeon: Zenovia Jarred, MD;  Location: [place];  Service: General;  Laterality: N/A;  . NERVE, TENDON AND ARTERY REPAIR Left 06/11/2013   Procedure: EXPLORATION AND REPAIR LEFT FOREARM;  Surgeon: Schuyler Amor, MD;  Location:  [place];  Service: Orthopedics;  Laterality: Left;  . SHOULDER ARTHROSCOPY WITH ROTATOR CUFF REPAIR AND SUBACROMIAL DECOMPRESSION Left 03/06/2016   Procedure: LEFT SHOULDER ARTHROSCOPY WITH ROTATOR CUFF REPAIR AND SUBACROMIAL DECOMPRESSION, DISTAL CLAVICLE EXCISION;  Surgeon: Leandrew Koyanagi, MD;  Location: [place];  Service: Orthopedics;  Laterality: Left;    reports that  has never smoked. she has never used smokeless tobacco. She reports that she does not drink alcohol or use drugs. family history includes Aneurysm in her mother; Heart attack in her maternal grandmother and mother; Hyperlipidemia in her mother; Hypertension in her father and mother; Lung cancer in her mother. Allergies  Allergen Reactions  . Cephalexin Itching  . Doxycycline Nausea And Vomiting  . Dilaudid [Hydromorphone Hcl] Other (See Comments)    Panic attack  . Sulfur Rash    Itching with rash      Outpatient Encounter Medications as of 12/08/2017  Medication Sig  . acetaminophen (TYLENOL) 500 MG tablet Take 1,000 mg by mouth every 8 (eight) hours as needed for mild pain.  Marland Kitchen amLODipine (NORVASC) 10 MG tablet Take 1 tablet by mouth once daily for blood pressure.  Marland Kitchen aspirin 325 MG tablet Take 325 mg daily by mouth.  Marland Kitchen buPROPion (WELLBUTRIN XL) 300 MG 24 hr tablet Take 300 mg by mouth daily.  . ciprofloxacin (CIPRO) 500 MG tablet Take 1 tablet (500 mg total) by mouth 2 (two) times daily.  . hydrochlorothiazide (HYDRODIURIL) 25 MG tablet Take 1 tablet (25 mg total) by mouth daily.  Marland Kitchen levothyroxine (SYNTHROID) 88 MCG tablet Take 1 tablet by mouth every morning on an empty stomach with a full glass of water.  Marland Kitchen lisinopril (PRINIVIL,ZESTRIL) 30 MG tablet Take 1 tablet (30 mg total) by mouth daily.  . metroNIDAZOLE (FLAGYL) 500 MG tablet Take 1 tablet (500 mg total) by mouth 3 (three) times daily.  . ondansetron (ZOFRAN) 4 MG tablet Take 1 tablet (4 mg total) by mouth every 8 (eight) hours as needed.  Marland Kitchen PARoxetine (PAXIL) 20 MG  tablet Take 1.5 tablets (30 mg total) by mouth daily.  . traZODone (DESYREL) 100 MG tablet Take 100 mg by mouth at bedtime.   No facility-administered encounter medications on file as of 12/08/2017.      REVIEW OF SYSTEMS  : All other systems reviewed and negative except where noted in the History of Present Illness.   PHYSICAL EXAM: BP 124/76   Pulse 72   Ht 5\' 1"  (1.549 m)   Wt 264 lb 9.6 oz (120 kg)   LMP 06/17/2007   BMI 50.00 kg/m  General: Well developed white female in no acute distress Head: Normocephalic and atraumatic Eyes:  Sclerae anicteric, conjunctiva pink. Ears: Normal auditory acuity Lungs: Clear throughout to auscultation; no increased WOB Heart: Regular rate and rhythm; no M/R/G. Abdomen: Soft, non-distended.  BS present.  Moderate LLQ TTP. Musculoskeletal: Symmetrical with no gross deformities  Skin: No lesions on visible extremities Extremities: No edema  Neurological: Alert oriented x 4, grossly non-focal Psychological:  Alert and cooperative. Normal mood and affect  ASSESSMENT AND PLAN: *Recurrent diverticulitis:  Has had 2 CT scans documenting this since August and has been treated at least 4 times.  Currently on cipro and flagyl for a 10 day course, which she finishes today, but still having LLQ abdominal pain.  I am gong to extend her course by 4 more days.  Also discussed going a a clear liquid, or at least full liquid, diet for a couple of days.  She will call back early next week and if still having pain then may need to repeat CT scan to rule out complication, etc.  I am also going to refer her to general surgery to discuss possible resection.   CC:  [name], NP  Agree with Ms. Alphia Kava management.  Gatha Mayer, MD, Marval Regal

## 2017-12-08 NOTE — Patient Instructions (Addendum)
If you are age 55 or older, your body mass index should be between 23-30. Your Body mass index is 50 kg/m. If this is out of the aforementioned range listed, please consider follow up with your Primary Care Provider.  If you are age 3 or younger, your body mass index should be between 19-25. Your Body mass index is 50 kg/m. If this is out of the aformentioned range listed, please consider follow up with your Primary Care Provider.   We have sent the following medications to your pharmacy for you to pick up at your convenience: Flagyl Cipro  Call with an update next Monday or Tuesday.  Refer to [facility] for Recurrent Diverticulitis.  Thank you for choosing me and [facility].  Alonza Bogus, PA-C

## 2017-12-09 ENCOUNTER — Ambulatory Visit
Admission: RE | Admit: 2017-12-09 | Discharge: 2017-12-09 | Disposition: A | Discharge status: Home / Self Care | Payer: No Typology Code available for payment source | Source: Ambulatory Visit | Attending: Nephrology | Admitting: Nephrology

## 2017-12-09 DIAGNOSIS — N183 Chronic kidney disease, stage 3 unspecified: Secondary | ICD-10-CM

## 2017-12-10 ENCOUNTER — Encounter: Payer: No Typology Code available for payment source | Admitting: Primary Care

## 2017-12-13 ENCOUNTER — Telehealth: Payer: Self-pay | Admitting: Gastroenterology

## 2017-12-13 NOTE — Telephone Encounter (Signed)
The pt states she does feel better and I advised her that [name] will call her with the [facility] appt as soon as she has been scheduled.

## 2017-12-14 ENCOUNTER — Other Ambulatory Visit: Payer: Self-pay

## 2017-12-14 DIAGNOSIS — K5792 Diverticulitis of intestine, part unspecified, without perforation or abscess without bleeding: Secondary | ICD-10-CM

## 2017-12-14 NOTE — Telephone Encounter (Signed)
Thank you.  We will hold off on scheduling another CT scan for now since she is feeling better.

## 2017-12-15 ENCOUNTER — Encounter: Payer: Self-pay | Admitting: *Deleted

## 2017-12-24 ENCOUNTER — Encounter: Payer: No Typology Code available for payment source | Admitting: Primary Care

## 2018-01-06 ENCOUNTER — Encounter: Payer: Self-pay | Admitting: General Surgery

## 2018-01-06 ENCOUNTER — Ambulatory Visit (INDEPENDENT_AMBULATORY_CARE_PROVIDER_SITE_OTHER): Payer: Self-pay | Admitting: General Surgery

## 2018-01-06 VITALS — BP 140/74 | HR 70 | Resp 16 | Ht 61.0 in | Wt 260.0 lb

## 2018-01-06 DIAGNOSIS — K5792 Diverticulitis of intestine, part unspecified, without perforation or abscess without bleeding: Secondary | ICD-10-CM

## 2018-01-06 NOTE — Progress Notes (Signed)
Patient ID: [name], female   DOB: November 23, 1960, 57 y.o.   MRN: [id number]  Chief Complaint  Patient presents with  . Diverticulitis    HPI [name] is a 57 y.o. female here today for a evaluation of diverticulitis. Patient states she was been having abdominal pain in her left lower quadrant for about eight months. Nausea and vomiting. She was seen in the ER in August 2018. If she has a flame up, she goes on a liquid diet for seven to ten days. Has lost 24 months . Moves her bowels daily. CT scan done on 08/29/2017 and Colonoscopy on 10/22/2017.  The patient reports that she has lost about 20 pounds since the onset of her symptoms last August.  Husband, Corene Cornea is present.   Past Medical History:  Diagnosis Date  . Acute kidney injury ([place]) 05/19/2017  . Acute myofascial strain of lumbosacral region 08/20/2016  . Allergic rhinitis   . Anemia, iron deficiency   . ANEMIA, IRON DEFICIENCY, history of 04/05/2008   Qualifier: Diagnosis of  By: Philbert Riser MD, Manrique    . Anginal pain [facility])    last cardiologist visit 2010 - dx/ed with anxiety instead of cardiac pain  . Arthritis   . Atypical chest pain    Negative cardiolite at [facility] cards 11/07  . BPPV (benign paroxysmal positional vertigo) 01/07/2017  . CHOLECYSTECTOMY, HX OF    Annotation: 2000 Qualifier: Diagnosis of  By: Eddie Dibbles MD, Bhakti    . Chronic left maxillary sinus disease 09/22/2012  . Chronic low back pain 11/08/2015  . Depression   . Diarrhea 05/19/2017  . Diverticulitis   . Elevated BP   . Family history of leukemia 09/22/2012   She states that both her uncle and his son was diagnosed with Leukemia. She would like to know more information about the leukemia.       . Hepatic steatosis 09/22/2012   Incidental finding on abd CT in 2011   . History of blood transfusion 03/2008   Requried 2u prbc; menometrorrhagia, uterine fibroids.  Marland Kitchen HTN (hypertension) 02/09/2014  . Hx of colonic polyp 10/26/2017  . Hypertension   .  Hypertriglyceridemia    164 - 2/11  . Hypothyroidism   . Injury of left rotator cuff 12/02/2016  . Insomnia   . INSOMNIA UNSPECIFIED 05/01/2010   Qualifier: Diagnosis of  By: Shon Baton, MD, Janett Billow    . Metabolic syndrome 63/10/6008  . Nausea 05/19/2017  . Nausea vomiting and diarrhea   . 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 Obesity    250 lbs 8/11  . OBESITY 08/31/2006   Annotation: Wt. 248 lbs. 11/2004 Qualifier: Diagnosis of  By: Eddie Dibbles MD, Bhakti    . Panic disorder 11/05  . PANIC DISORDER 08/31/2006   Diagnosed 11/05, well-controlled with Paxil, patient also takes albuterol when she has a panic attack, maybe once/month   . Pectus excavatum 09/22/2012   Incidental finding on CXR in 2013.    Marland Kitchen Perirectal abscess 11/14/2011  . Prediabetes 09/22/2012  . Preventative health care 02/09/2014  . Proteinuria 07/17/2012  . Shortness of breath 05/18/2012   w/exertion"  . Urinary incontinence without sensory awareness 12/17/2016  . Venous (peripheral) insufficiency 11/08/2015    Past Surgical History:  Procedure Laterality Date  . ABDOMINAL HYSTERECTOMY  04/2007   total abdominal hysterectomy ( cervix) with bilateral salpingo-oopherectomy  . CHOLECYSTECTOMY  07/30/1999   Diagnosis of  By: Eddie Dibbles MD, Bhakti    . DILATION AND CURETTAGE OF UTERUS  [place] ABSCESS  11/02/2012   Procedure: INCISION AND DRAINAGE ABSCESS;  Surgeon: Odis Hollingshead, MD;  Location: [place];  Service: General;  Laterality: N/A;  I&D anorectal abscess  . INCISION AND DRAINAGE PERIRECTAL ABSCESS  11/14/2011   Procedure: IRRIGATION AND DEBRIDEMENT PERIRECTAL ABSCESS;  Surgeon: Zenovia Jarred, MD;  Location: [place];  Service: General;  Laterality: N/A;  . NERVE, TENDON AND ARTERY REPAIR Left 06/11/2013   Procedure: EXPLORATION AND REPAIR LEFT FOREARM;  Surgeon: Schuyler Amor, MD;  Location: [place];  Service: Orthopedics;  Laterality: Left;  . SHOULDER ARTHROSCOPY WITH ROTATOR CUFF REPAIR AND SUBACROMIAL  DECOMPRESSION Left 03/06/2016   Procedure: LEFT SHOULDER ARTHROSCOPY WITH ROTATOR CUFF REPAIR AND SUBACROMIAL DECOMPRESSION, DISTAL CLAVICLE EXCISION;  Surgeon: Leandrew Koyanagi, MD;  Location: [place];  Service: Orthopedics;  Laterality: Left;    Family History  Problem Relation Age of Onset  . Heart attack Mother        at the age of 4's  . Lung cancer Mother        metestatic, + smoker  . Aneurysm Mother   . Hyperlipidemia Mother   . Hypertension Mother   . Hypertension Father   . Heart attack Maternal Grandmother        at the age of 18's  . Colon cancer Neg Hx   . Stomach cancer Neg Hx   . Esophageal cancer Neg Hx   . Rectal cancer Neg Hx     Social History Social History   Tobacco Use  . Smoking status: Never Smoker  . Smokeless tobacco: Never Used  Substance Use Topics  . Alcohol use: No    Alcohol/week: 0.0 oz  . Drug use: No    Allergies  Allergen Reactions  . Cephalexin Itching  . Doxycycline Nausea And Vomiting  . Dilaudid [Hydromorphone Hcl] Other (See Comments)    Panic attack  . Sulfur Rash    Itching with rash    Current Outpatient Medications  Medication Sig Dispense Refill  . acetaminophen (TYLENOL) 500 MG tablet Take 1,000 mg by mouth every 8 (eight) hours as needed for mild pain.    Marland Kitchen amLODipine (NORVASC) 10 MG tablet Take 1 tablet by mouth once daily for blood pressure. 90 tablet 3  . aspirin 325 MG tablet Take 325 mg daily by mouth.    Marland Kitchen buPROPion (WELLBUTRIN XL) 300 MG 24 hr tablet Take 300 mg by mouth daily.    . hydrochlorothiazide (HYDRODIURIL) 25 MG tablet Take 1 tablet (25 mg total) by mouth daily. 90 tablet 3  . levothyroxine (SYNTHROID) 88 MCG tablet Take 1 tablet by mouth every morning on an empty stomach with a full glass of water. 90 tablet 2  . lisinopril (PRINIVIL,ZESTRIL) 30 MG tablet Take 1 tablet (30 mg total) by mouth daily. 90 tablet 2  . ondansetron (ZOFRAN) 4 MG tablet Take 1 tablet (4 mg total) by mouth every 8 (eight) hours as  needed. 20 tablet 0  . PARoxetine (PAXIL) 20 MG tablet Take 1.5 tablets (30 mg total) by mouth daily. 135 tablet 2  . traZODone (DESYREL) 100 MG tablet Take 100 mg by mouth at bedtime.     No current facility-administered medications for this visit.     Review of Systems Review of Systems  Constitutional: Negative.   Respiratory: Negative.   Cardiovascular: Negative.     Blood pressure 140/74, pulse 70, resp. rate 16, height 5\' 1"  (1.549 m), weight 260  lb (117.9 kg), last menstrual period 06/17/2007.  Physical Exam Physical Exam  Constitutional: She appears well-developed and well-nourished.  Cardiovascular: Normal rate and regular rhythm.  Pulmonary/Chest: Effort normal and breath sounds normal.  Abdominal: Soft. Bowel sounds are normal. There is tenderness (Minimal left lower quadrant discomfort with firm palpation.).      Data Reviewed Colonoscopy of October 22, 2017 showing diverticulosis and some mild narrowing, at least based on images, of the sigmoid colon.  No obstruction.   Her  CT scans from 2018 reviewed. Operatory studies from December 07, 2017 showed hemoglobin A1c of 5.7.  TSH 3.48  Comprehensive metabolic panel with a creatinine of 1.30 and estimated GFR of 45.  Normal electrolytes, normal liver function studies.  Assessment    History of recurrent left lower quadrant pain preceded by constipation.  Mild stenosis of the sigmoid colon based on January 2019 colonoscopy.  At least 2 episodes of mild diverticulitis based on CT.    Plan  The patient correlated her episodes of constipation with onset of her left lower quadrant pain.  She has very mild luminal change in the sigmoid colon based on endoscopy findings, and no prior episode of perforated disease mandating resection.  She is at high risk based on her orbit obesity.  Prior to considering sigmoid colectomy, the patient's been asked to make use of MiraLAX daily, and will reassess her symptoms in 1  month.  The importance of a high protein diet, avoiding large quantities of raw fruits and vegetables and ongoing weight loss should surgical intervention be required was reviewed.  Patient to use a cap fill of  Miralax daily  for one month. High protein diet. The patient is aware to call back for any questions or concerns.   HPI, Physical Exam, Assessment and Plan have been scribed under the direction and in the presence of Hervey Ard, MD.  Gaspar Cola, CMA  I have completed the exam and reviewed the above documentation for accuracy and completeness.  I agree with the above.  Haematologist has been used and any errors in dictation or transcription are unintentional.  Hervey Ard, M.D., F.A.C.S.  Forest Gleason Lakeysha Slutsky 01/07/2018, 4:32 PM

## 2018-01-06 NOTE — Patient Instructions (Signed)
Patient to use a cap fill of  Miralax daily  for one month. High protein diet. The patient is aware to call back for any questions or concerns.

## 2018-01-13 ENCOUNTER — Ambulatory Visit (HOSPITAL_COMMUNITY): Payer: No Typology Code available for payment source

## 2018-01-26 ENCOUNTER — Encounter: Payer: No Typology Code available for payment source | Admitting: Primary Care

## 2018-02-08 ENCOUNTER — Telehealth: Payer: Self-pay

## 2018-02-08 ENCOUNTER — Ambulatory Visit (INDEPENDENT_AMBULATORY_CARE_PROVIDER_SITE_OTHER): Payer: Self-pay | Admitting: General Surgery

## 2018-02-08 VITALS — BP 106/60 | HR 79 | Resp 16 | Ht 61.0 in | Wt 262.0 lb

## 2018-02-08 DIAGNOSIS — K5792 Diverticulitis of intestine, part unspecified, without perforation or abscess without bleeding: Secondary | ICD-10-CM

## 2018-02-08 DIAGNOSIS — Z6841 Body Mass Index (BMI) 40.0 and over, adult: Secondary | ICD-10-CM

## 2018-02-08 NOTE — Progress Notes (Signed)
Patient ID: [name], female   DOB: 04-11-1961, 57 y.o.   MRN: [id number]  Chief Complaint  Patient presents with  . Other    HPI [name] is a 57 y.o. female here today for her one month follow up diverticulitis. She states she has noticed left lower quadrant pain on and off for the past month. She does admit to nausea, but no vomiting. Bowels move daily with the use of miralax. Diarrhea for 2 days and LLQ pain which started today. She states the pain last 2-3 days.  She states when she calls her primary care they give her antibiotics and tell her to eat a clear liquid diet.  She is here with her husband, Corene Cornea.  HPI  Past Medical History:  Diagnosis Date  . Acute kidney injury ([place]) 05/19/2017  . Acute myofascial strain of lumbosacral region 08/20/2016  . Allergic rhinitis   . Anemia, iron deficiency   . ANEMIA, IRON DEFICIENCY, history of 04/05/2008   Qualifier: Diagnosis of  By: Philbert Riser MD, Manrique    . Anginal pain [facility])    last cardiologist visit 2010 - dx/ed with anxiety instead of cardiac pain  . Arthritis   . Atypical chest pain    Negative cardiolite at [facility] cards 11/07  . BPPV (benign paroxysmal positional vertigo) 01/07/2017  . CHOLECYSTECTOMY, HX OF    Annotation: 2000 Qualifier: Diagnosis of  By: Eddie Dibbles MD, Bhakti    . Chronic left maxillary sinus disease 09/22/2012  . Chronic low back pain 11/08/2015  . Depression   . Diarrhea 05/19/2017  . Diverticulitis   . Elevated BP   . Family history of leukemia 09/22/2012   She states that both her uncle and his son was diagnosed with Leukemia. She would like to know more information about the leukemia.       . Hepatic steatosis 09/22/2012   Incidental finding on abd CT in 2011   . History of blood transfusion 03/2008   Requried 2u prbc; menometrorrhagia, uterine fibroids.  Marland Kitchen HTN (hypertension) 02/09/2014  . Hx of colonic polyp 10/26/2017  . Hypertension   . Hypertriglyceridemia    164 - 2/11  . Hypothyroidism    . Injury of left rotator cuff 12/02/2016  . Insomnia   . INSOMNIA UNSPECIFIED 05/01/2010   Qualifier: Diagnosis of  By: Shon Baton, MD, Janett Billow    . Metabolic syndrome 44/81/8563  . Nausea 05/19/2017  . Nausea vomiting and diarrhea   . 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 Obesity    250 lbs 8/11  . OBESITY 08/31/2006   Annotation: Wt. 248 lbs. 11/2004 Qualifier: Diagnosis of  By: Eddie Dibbles MD, Bhakti    . Panic disorder 11/05  . PANIC DISORDER 08/31/2006   Diagnosed 11/05, well-controlled with Paxil, patient also takes albuterol when she has a panic attack, maybe once/month   . Pectus excavatum 09/22/2012   Incidental finding on CXR in 2013.    Marland Kitchen Perirectal abscess 11/14/2011  . Prediabetes 09/22/2012  . Preventative health care 02/09/2014  . Proteinuria 07/17/2012  . Shortness of breath 05/18/2012   w/exertion"  . Urinary incontinence without sensory awareness 12/17/2016  . Venous (peripheral) insufficiency 11/08/2015    Past Surgical History:  Procedure Laterality Date  . ABDOMINAL HYSTERECTOMY  04/2007   total abdominal hysterectomy ( cervix) with bilateral salpingo-oopherectomy  . CHOLECYSTECTOMY  07/30/1999   Diagnosis of  By: Eddie Dibbles MD, Bhakti    . DILATION AND CURETTAGE OF UTERUS  1987  . INCISION AND  DRAINAGE ABSCESS  11/02/2012   Procedure: INCISION AND DRAINAGE ABSCESS;  Surgeon: Odis Hollingshead, MD;  Location: [place];  Service: General;  Laterality: N/A;  I&D anorectal abscess  . INCISION AND DRAINAGE PERIRECTAL ABSCESS  11/14/2011   Procedure: IRRIGATION AND DEBRIDEMENT PERIRECTAL ABSCESS;  Surgeon: Zenovia Jarred, MD;  Location: [place];  Service: General;  Laterality: N/A;  . NERVE, TENDON AND ARTERY REPAIR Left 06/11/2013   Procedure: EXPLORATION AND REPAIR LEFT FOREARM;  Surgeon: Schuyler Amor, MD;  Location: [place];  Service: Orthopedics;  Laterality: Left;  . SHOULDER ARTHROSCOPY WITH ROTATOR CUFF REPAIR AND SUBACROMIAL DECOMPRESSION Left 03/06/2016   Procedure: LEFT SHOULDER  ARTHROSCOPY WITH ROTATOR CUFF REPAIR AND SUBACROMIAL DECOMPRESSION, DISTAL CLAVICLE EXCISION;  Surgeon: Leandrew Koyanagi, MD;  Location: [place];  Service: Orthopedics;  Laterality: Left;    Family History  Problem Relation Age of Onset  . Heart attack Mother        at the age of 69's  . Lung cancer Mother        metestatic, + smoker  . Aneurysm Mother   . Hyperlipidemia Mother   . Hypertension Mother   . Hypertension Father   . Heart attack Maternal Grandmother        at the age of 48's  . Colon cancer Neg Hx   . Stomach cancer Neg Hx   . Esophageal cancer Neg Hx   . Rectal cancer Neg Hx     Social History Social History   Tobacco Use  . Smoking status: Never Smoker  . Smokeless tobacco: Never Used  Substance Use Topics  . Alcohol use: No    Alcohol/week: 0.0 oz  . Drug use: No    Allergies  Allergen Reactions  . Cephalexin Itching  . Doxycycline Nausea And Vomiting  . Dilaudid [Hydromorphone Hcl] Other (See Comments)    Panic attack  . Sulfur Rash    Itching with rash    Current Outpatient Medications  Medication Sig Dispense Refill  . acetaminophen (TYLENOL) 500 MG tablet Take 1,000 mg by mouth every 8 (eight) hours as needed for mild pain.    Marland Kitchen amLODipine (NORVASC) 10 MG tablet Take 1 tablet by mouth once daily for blood pressure. 90 tablet 3  . aspirin 325 MG tablet Take 325 mg daily by mouth.    Marland Kitchen buPROPion (WELLBUTRIN XL) 300 MG 24 hr tablet Take 300 mg by mouth daily.    . hydrochlorothiazide (HYDRODIURIL) 25 MG tablet Take 1 tablet (25 mg total) by mouth daily. 90 tablet 3  . levothyroxine (SYNTHROID) 88 MCG tablet Take 1 tablet by mouth every morning on an empty stomach with a full glass of water. 90 tablet 2  . lisinopril (PRINIVIL,ZESTRIL) 30 MG tablet Take 1 tablet (30 mg total) by mouth daily. 90 tablet 2  . ondansetron (ZOFRAN) 4 MG tablet Take 1 tablet (4 mg total) by mouth every 8 (eight) hours as needed. 20 tablet 0  . PARoxetine (PAXIL) 20 MG tablet  Take 1.5 tablets (30 mg total) by mouth daily. 135 tablet 2  . traZODone (DESYREL) 100 MG tablet Take 100 mg by mouth at bedtime.     No current facility-administered medications for this visit.     Review of Systems Review of Systems  Constitutional: Negative.   Respiratory: Negative.   Cardiovascular: Negative.   Gastrointestinal: Positive for abdominal pain and nausea. Negative for constipation and vomiting.    Blood pressure 106/60, pulse 79, resp. rate  16, height 5\' 1"  (1.549 m), weight 262 lb (118.8 kg), last menstrual period 06/17/2007, SpO2 98 %. The patient's weight is up 2 pounds from her last visits in spite of her reports of being on a clear liquid diet for many days due to abdominal pain.  Physical Exam Physical Exam  Constitutional: She is oriented to person, place, and time. She appears well-developed and well-nourished.  Eyes: Pupils are equal, round, and reactive to light. No scleral icterus.  Neck: Normal range of motion.  Cardiovascular: Normal rate, regular rhythm and normal heart sounds.  No lower leg edema  Pulmonary/Chest: Effort normal and breath sounds normal.  Abdominal: Soft. Normal appearance and bowel sounds are normal. There is tenderness in the left lower quadrant.  Lymphadenopathy:    She has no cervical adenopathy.  Neurological: She is alert and oriented to person, place, and time.  Skin: Skin is warm and dry.    Data Reviewed Prior colonoscopy and CT studies reviewed.  Assessment    Diverticular stricture.  Morbid obesity.      Plan  Discussed surgery.  Increased risk due to morbid obesity.  Potential for stoma reviewed, unlikely.  Importance of preoperative exercise to help improve postoperative recovery.  Use of nutritional supplement morning of surgery reviewed.  HPI, Physical Exam, Assessment and Plan have been scribed under the direction and in the presence of Hervey Ard, MD.  Gaspar Cola, CMA  I have completed  the exam and reviewed the above documentation for accuracy and completeness.  I agree with the above.  Haematologist has been used and any errors in dictation or transcription are unintentional.  Hervey Ard, M.D., F.A.C.S.  Forest Gleason Sharlena Kristensen 02/09/2018, 5:52 AM

## 2018-02-08 NOTE — Patient Instructions (Signed)
The patient is aware to call back for any questions or concerns.  Laparoscopic Colectomy Laparoscopic colectomy is surgery to remove part or all of the large intestine (colon). This procedure may be used to treat several conditions, including:  Inflammation and infection of the colon (diverticulitis).  Tumors or masses in the colon.  Inflammatory bowel disease, such as Crohn disease or ulcerative colitis. Colectomy is an option when symptoms cannot be controlled with medicines.  Bleeding from the colon that cannot be controlled by another method.  Blockage or obstruction of the colon.  Tell a health care provider about:  Any allergies you have.  All medicines you are taking, including vitamins, herbs, eye drops, creams, and over-the-counter medicines.  Any problems you or family members have had with anesthetic medicines.  Any blood disorders you have.  Any surgeries you have had.  Any medical conditions you have. What are the risks? Generally, this is a safe procedure. However, problems may occur, including:  Infection.  Bleeding.  Allergic reactions to medicines or dyes.  Damage to other structures or organs.  Leaking from where the colon was sewn together.  Future blockage of the small intestines from scar tissue. Another surgery may be needed to repair this.  Needing to convert to an open procedure. Complications such as damage to other organs or excessive bleeding may require the surgeon to convert from a laparoscopic procedure to an open procedure. This involves making a larger incision in the abdomen.  What happens before the procedure? Staying hydrated Follow instructions from your health care provider about hydration, which may include:  Up to 2 hours before the procedure - you may continue to drink clear liquids, such as water, clear fruit juice, black coffee, and plain tea.  Eating and drinking restrictions Follow instructions from your health care  provider about eating and drinking, which may include:  8 hours before the procedure - stop eating heavy meals, meals with high fiber, or foods such as meat, fried foods, or fatty foods.  6 hours before the procedure - stop eating light meals or foods, such as toast or cereal.  6 hours before the procedure - stop drinking milk or drinks that contain milk.  2 hours before the procedure - stop drinking clear liquids.  Medicines  Ask your health care provider about: ? Changing or stopping your regular medicines. This is especially important if you are taking diabetes medicines or blood thinners. ? Taking medicines such as aspirin and ibuprofen. These medicines can thin your blood. Do not take these medicines before your procedure if your health care provider instructs you not to.  You may be given antibiotic medicine to clean out bacteria from your colon. Follow the directions carefully and take the medicine at the correct time. General instructions  You may be prescribed an oral bowel prep to clean out your colon in preparation for the surgery: ? Follow instructions from your health care provider about how to do this. ? Do not eat or drink anything else after you have started the bowel prep, unless your health care provider tells you it is safe to do so.  Do not use any products that contain nicotine or tobacco, such as cigarettes and e-cigarettes. If you need help quitting, ask your health care provider. What happens during the procedure?  To reduce your risk of infection: ? Your health care team will wash or sanitize their hands. ? Your skin will be washed with soap.  An IV tube will be  inserted into one of your veins to deliver fluid and medication.  You will be given one of the following: ? A medicine to help you relax (sedative). ? A medicine to make you fall asleep (general anesthetic).  Small monitors will be connected to your body. They will be used to check your heart,  blood pressure, and oxygen level.  A breathing tube may be placed into your lungs during the procedure.  A thin, flexible tube (catheter) will be placed into your bladder to drain urine.  A tube may be placed through your nose and into your stomach to drain stomach fluids (nasogastric tube, or NG tube).  Your abdomen will be filled with air so it expands. This gives the surgeon more room to operate and makes your organs easier to see.  Several small cuts (incisions) will be made in your abdomen.  A thin, lighted tube with a tiny camera on the end (laparoscope) will be put through one of the small incisions. The camera on the laparoscope will send a picture to a computer screen in the operating room. This will give the surgeon a good view inside your abdomen.  Hollow tubes will be put through the other small incisions in your abdomen. The tools that are needed for the procedure will be put through these tubes.  Clamps or staples will be put on both ends of the diseased part of the colon.  The part of the intestine between the clamps or staples will be removed.  If possible, the ends of the healthy colon that remain will be stitched (sutured) or stapled together to allow your body to pass waste (stool).  Sometimes, the remaining colon cannot be stitched back together. If this is the case, a colostomy will be needed. If you need a colostomy: ? An opening to the outside of your body (stoma) will be made through your abdomen. ? The end of your colon will be brought to the opening. It will be stitched to the skin. ? A bag will be attached to the opening. Stool will drain into this removable bag. ? The colostomy may be temporary or permanent.  The incisions from the colectomy will be closed with sutures or staples. The procedure may vary among health care providers and hospitals. What happens after the procedure?  Your blood pressure, heart rate, breathing rate, and blood oxygen level will  be monitored until the medicines you were given have worn off.  You will receive fluids through an IV tube until your bowels start to work properly.  Once your bowels are working again, you will be given clear liquids first and then solid food as tolerated.  You will be given medicines to control your pain and nausea, if needed.  Do not drive for 24 hours if you were given a sedative. This information is not intended to replace advice given to you by your health care provider. Make sure you discuss any questions you have with your health care provider. Document Released: 12/19/2002 Document Revised: 06/29/2016 Document Reviewed: 06/29/2016 Elsevier Interactive Patient Education  [employer].

## 2018-02-08 NOTE — Telephone Encounter (Signed)
Message left for the patient to call back to let us know if 02/23/18 would be good for her surgery date.

## 2018-02-09 ENCOUNTER — Encounter: Payer: Self-pay | Admitting: *Deleted

## 2018-02-09 ENCOUNTER — Other Ambulatory Visit: Payer: Self-pay | Admitting: General Surgery

## 2018-02-09 ENCOUNTER — Encounter: Payer: Self-pay | Admitting: General Surgery

## 2018-02-09 DIAGNOSIS — K5792 Diverticulitis of intestine, part unspecified, without perforation or abscess without bleeding: Secondary | ICD-10-CM

## 2018-02-09 MED ORDER — NEOMYCIN SULFATE 500 MG PO TABS: ORAL_TABLET | ORAL | 0 refills | Status: DC

## 2018-02-09 MED ORDER — POLYETHYLENE GLYCOL 3350 17 GM/SCOOP PO POWD: ORAL | 0 refills | Status: DC

## 2018-02-09 MED ORDER — METRONIDAZOLE 500 MG PO TABS: ORAL_TABLET | ORAL | 0 refills | Status: AC

## 2018-02-09 NOTE — Progress Notes (Signed)
Patient's surgery has been scheduled for 02-23-18 at [facility]. Dr. Nestor Lewandowsky will be assisting with this case.   The patient has been asked to decrease current 325 mg aspirin dose to 81 mg aspirin 5 days prior to surgery.   Patient has been asked to complete a bowel prep with antibiotics day prior to surgery. She was provided with instructions yesterday and verbalizes understanding. Prescriptions have been sent in to the patient's pharmacy today.   The patient was instructed to call the office should she have further questions.

## 2018-02-14 ENCOUNTER — Encounter: Payer: Self-pay | Admitting: *Deleted

## 2018-02-14 ENCOUNTER — Telehealth: Payer: Self-pay | Admitting: *Deleted

## 2018-02-14 NOTE — Telephone Encounter (Signed)
Patient called Threasa Beards this morning to report that [facility] [address] in [place] did not receive electronic bowel prep medications that were sent on 02-09-18.  The pharmacy was contacted this morning and given a verbal order to fill medications.

## 2018-02-17 ENCOUNTER — Encounter
Admission: RE | Admit: 2018-02-17 | Discharge: 2018-02-17 | Disposition: A | Discharge status: Home / Self Care | Payer: No Typology Code available for payment source | Source: Ambulatory Visit | Attending: General Surgery | Admitting: General Surgery

## 2018-02-17 ENCOUNTER — Other Ambulatory Visit: Payer: Self-pay

## 2018-02-17 HISTORY — DX: Personal history of Methicillin resistant Staphylococcus aureus infection: Z86.14

## 2018-02-17 HISTORY — DX: Gastro-esophageal reflux disease without esophagitis: K21.9

## 2018-02-17 NOTE — Patient Instructions (Signed)
Your procedure is scheduled on: 02-23-18 [facility] Report to Same Day Surgery 2nd floor medical mall [facility] Entrance-take elevator on left to 2nd floor.  Check in with surgery information desk.) To find out your arrival time please call [phone] between 1PM - 3PM on 02-22-18 TUESDAY  Remember: Instructions that are not followed completely may result in serious medical risk, up to and including death, or upon the discretion of your surgeon and anesthesiologist your surgery may need to be rescheduled.    _x___ 1. Do not eat food after midnight the night before your procedure. NO GUM OR CANDY AFTER MIDNIGHT.  You may drink clear liquids up to 2 hours before you are scheduled to arrive at the hospital for your procedure.  Do not drink clear liquids within 2 hours of your scheduled arrival to the hospital.  Clear liquids include  --Water or Apple juice without pulp  --Clear carbohydrate beverage such as ClearFast or Gatorade  --Black Coffee or Clear Tea (No milk, no creamers, do not add anything to the coffee or Tea     __x__ 2. No Alcohol for 24 hours before or after surgery.   __x__3. No Smoking or e-cigarettes for 24 prior to surgery.  Do not use any chewable tobacco products for at least 6 hour prior to surgery   ____  4. Bring all medications with you on the day of surgery if instructed.    __x__ 5. Notify your doctor if there is any change in your medical condition     (cold, fever, infections).    x___6. On the morning of surgery brush your teeth with toothpaste and water.  You may rinse your mouth with mouth wash if you wish.  Do not swallow any toothpaste or mouthwash.   Do not wear jewelry, make-up, hairpins, clips or nail polish.  Do not wear lotions, powders, or perfumes. You may wear deodorant.  Do not shave 48 hours prior to surgery. Men may shave face and neck.  Do not bring valuables to the hospital.    [facility] is not responsible for any belongings or  valuables.               Contacts, dentures or bridgework may not be worn into surgery.  Leave your suitcase in the car. After surgery it may be brought to your room.  For patients admitted to the hospital, discharge time is determined by your treatment team.  _  Patients discharged the day of surgery will not be allowed to drive home.  You will need someone to drive you home and stay with you the night of your procedure.    Please read over the following fact sheets that you were given:   [facility] Preparing for Surgery and or MRSA Information   _x___ TAKE THE FOLLOWING MEDICATION THE MORNING OF SURGERY WITH A SMALL SIP OF WATER. These include:  1.DRINK YOUR ENSURE 1 HOUR PRIOR TO YOUR ARRIVAL TIME TO HOSPITAL  2. FOLLOW DR Dwyane Luo COLON PREP THAT WAS GIVEN TO YOU IN HIS OFFICE  3.  4.  5.  6.  ____Fleets enema or Magnesium Citrate as directed.   _x___ Use CHG Soap or sage wipes as directed on instruction sheet   ____ Use inhalers on the day of surgery and bring to hospital day of surgery  ____ Stop Metformin and Janumet 2 days prior to surgery.    ____ Take 1/2 of usual insulin dose the night before surgery and none on  the morning surgery.   _x___ Follow recommendations from Cardiologist, Pulmonologist or PCP regarding stopping Aspirin, Coumadin, Plavix ,Eliquis, Effient, or Pradaxa, and Pletal-PT TO DECREASE 325 MG ASPIRIN TO 81 MG ASPIRIN ON 02-18-14 AS INSTRUCTED BY DR BYRNETT  ____Stop Anti-inflammatories such as Advil, Aleve, Ibuprofen, Motrin, Naproxen, Naprosyn, Goodies powders or aspirin products-OK to take Tylenol    ____ Stop supplements until after surgery.    ____ Bring C-Pap to the hospital.

## 2018-02-17 NOTE — Pre-Procedure Instructions (Signed)
SPOKE WITH MICHELE AT DR BYRNETT'S OFFICE REGARDING THE ENSURE THAT HE HAS ORDERED PT TO DRINK PREOP-DR BYRNETT WANTS PT TO DRINK ENSURE 1 HOUR PRIOR TO [place]

## 2018-02-18 ENCOUNTER — Encounter
Admission: RE | Admit: 2018-02-18 | Discharge: 2018-02-18 | Disposition: A | Discharge status: Home / Self Care | Payer: No Typology Code available for payment source | Source: Ambulatory Visit | Attending: General Surgery | Admitting: General Surgery

## 2018-02-18 DIAGNOSIS — I10 Essential (primary) hypertension: Secondary | ICD-10-CM

## 2018-02-18 DIAGNOSIS — K5792 Diverticulitis of intestine, part unspecified, without perforation or abscess without bleeding: Secondary | ICD-10-CM | POA: Insufficient documentation

## 2018-02-18 LAB — CBC WITH DIFFERENTIAL/PLATELET
BASOS PCT: 0 %
Basophils Absolute: 0 10*3/uL (ref 0–0.1)
EOS ABS: 0.1 10*3/uL (ref 0–0.7)
Eosinophils Relative: 1 %
HEMATOCRIT: 38.9 % (ref 35.0–47.0)
Hemoglobin: 13.2 g/dL (ref 12.0–16.0)
Lymphocytes Relative: 5 %
Lymphs Abs: 0.6 10*3/uL — ABNORMAL LOW (ref 1.0–3.6)
MCH: 30.4 pg (ref 26.0–34.0)
MCHC: 33.8 g/dL (ref 32.0–36.0)
MCV: 89.9 fL (ref 80.0–100.0)
MONOS PCT: 4 %
Monocytes Absolute: 0.5 10*3/uL (ref 0.2–0.9)
NEUTROS ABS: 12 10*3/uL — AB (ref 1.4–6.5)
NEUTROS PCT: 90 %
Platelets: 221 10*3/uL (ref 150–440)
RBC: 4.33 MIL/uL (ref 3.80–5.20)
RDW: 13.5 % (ref 11.5–14.5)
WBC: 13.3 10*3/uL — ABNORMAL HIGH (ref 3.6–11.0)

## 2018-02-18 LAB — BASIC METABOLIC PANEL
ANION GAP: 7 (ref 5–15)
BUN: 20 mg/dL (ref 6–20)
CALCIUM: 9.1 mg/dL (ref 8.9–10.3)
CO2: 27 mmol/L (ref 22–32)
Chloride: 103 mmol/L (ref 101–111)
Creatinine, Ser: 1.32 mg/dL — ABNORMAL HIGH (ref 0.44–1.00)
GFR calc non Af Amer: 45 mL/min — ABNORMAL LOW (ref >60)
GFR, EST AFRICAN AMERICAN: 52 mL/min — AB (ref >60)
Glucose, Bld: 147 mg/dL — ABNORMAL HIGH (ref 65–99)
Potassium: 3.7 mmol/L (ref 3.5–5.1)
SODIUM: 137 mmol/L (ref 135–145)

## 2018-02-18 LAB — TYPE AND SCREEN
ABO/RH(D): A NEG
Antibody Screen: NEGATIVE

## 2018-02-18 LAB — SURGICAL PCR SCREEN
MRSA, PCR: NEGATIVE
Staphylococcus aureus: POSITIVE — AB

## 2018-02-21 ENCOUNTER — Telehealth: Payer: Self-pay | Admitting: *Deleted

## 2018-02-21 ENCOUNTER — Other Ambulatory Visit: Payer: Self-pay | Admitting: General Surgery

## 2018-02-21 ENCOUNTER — Telehealth: Payer: Self-pay

## 2018-02-21 MED ORDER — MUPIROCIN CALCIUM 2 % EX CREA: TOPICAL_CREAM | CUTANEOUS | 0 refills | Status: DC

## 2018-02-21 NOTE — Telephone Encounter (Signed)
Patient called and is having surgery on 02/23/18, she stated that Bactroban cream was called into her pharmacy and it is going to cost her 300.00 to get it and right now she can not afford that. Is there anything else that can be called in for her.

## 2018-02-21 NOTE — Telephone Encounter (Signed)
Spoke with Dr Bary Castilla and the patient may use Neosporin instead. She will use this twice a day.

## 2018-02-21 NOTE — Progress Notes (Signed)
PCR Screening positive for MSSA. Will RX w/ bactroban preop.

## 2018-02-21 NOTE — Telephone Encounter (Signed)
-----   Message from Robert Bellow, MD sent at 02/21/2018  9:40 AM EDT ----- These notify the patient I sent a prescription for Bactroban to her pharmacy.  She needs to use this on the anterior part of both inner aspect of both nasal passages twice a day including the morning of surgery to minimize the risk of infection.  Thank you tell her to bring this with her to the hospital.

## 2018-02-21 NOTE — Telephone Encounter (Signed)
Per Dr Bary Castilla the patient may use Neosporin instead due to cost.

## 2018-02-22 MED ORDER — ERTAPENEM SODIUM 1 G IJ SOLR
1.0000 g | INTRAMUSCULAR | Status: AC
  Administered 2018-02-23: 1 g via INTRAVENOUS
  Filled 2018-02-22: qty 1

## 2018-02-23 ENCOUNTER — Inpatient Hospital Stay: Payer: Self-pay | Admitting: Anesthesiology

## 2018-02-23 ENCOUNTER — Encounter
Admission: RE | Disposition: A | Discharge status: Home / Self Care | Payer: Self-pay | Source: Ambulatory Visit | Attending: General Surgery

## 2018-02-23 ENCOUNTER — Encounter: Payer: Self-pay | Admitting: Anesthesiology

## 2018-02-23 ENCOUNTER — Inpatient Hospital Stay
Admission: RE | Admit: 2018-02-23 | Discharge: 2018-02-25 | DRG: 330 | Disposition: A | Discharge status: Home / Self Care | Payer: Self-pay | Source: Ambulatory Visit | Attending: General Surgery | Admitting: General Surgery

## 2018-02-23 ENCOUNTER — Other Ambulatory Visit: Payer: Self-pay

## 2018-02-23 DIAGNOSIS — I10 Essential (primary) hypertension: Secondary | ICD-10-CM | POA: Diagnosis present

## 2018-02-23 DIAGNOSIS — K5792 Diverticulitis of intestine, part unspecified, without perforation or abscess without bleeding: Secondary | ICD-10-CM

## 2018-02-23 DIAGNOSIS — Z885 Allergy status to narcotic agent status: Secondary | ICD-10-CM

## 2018-02-23 DIAGNOSIS — F419 Anxiety disorder, unspecified: Secondary | ICD-10-CM | POA: Diagnosis present

## 2018-02-23 DIAGNOSIS — Z882 Allergy status to sulfonamides status: Secondary | ICD-10-CM

## 2018-02-23 DIAGNOSIS — N736 Female pelvic peritoneal adhesions (postinfective): Secondary | ICD-10-CM | POA: Diagnosis present

## 2018-02-23 DIAGNOSIS — Z7982 Long term (current) use of aspirin: Secondary | ICD-10-CM

## 2018-02-23 DIAGNOSIS — K5730 Diverticulosis of large intestine without perforation or abscess without bleeding: Secondary | ICD-10-CM | POA: Diagnosis present

## 2018-02-23 DIAGNOSIS — Z79899 Other long term (current) drug therapy: Secondary | ICD-10-CM

## 2018-02-23 DIAGNOSIS — Z6841 Body Mass Index (BMI) 40.0 and over, adult: Secondary | ICD-10-CM

## 2018-02-23 DIAGNOSIS — F329 Major depressive disorder, single episode, unspecified: Secondary | ICD-10-CM | POA: Diagnosis present

## 2018-02-23 DIAGNOSIS — K5732 Diverticulitis of large intestine without perforation or abscess without bleeding: Principal | ICD-10-CM | POA: Diagnosis present

## 2018-02-23 DIAGNOSIS — Z881 Allergy status to other antibiotic agents status: Secondary | ICD-10-CM

## 2018-02-23 DIAGNOSIS — K56699 Other intestinal obstruction unspecified as to partial versus complete obstruction: Secondary | ICD-10-CM | POA: Diagnosis present

## 2018-02-23 DIAGNOSIS — E039 Hypothyroidism, unspecified: Secondary | ICD-10-CM | POA: Diagnosis present

## 2018-02-23 DIAGNOSIS — K5733 Diverticulitis of large intestine without perforation or abscess with bleeding: Secondary | ICD-10-CM

## 2018-02-23 DIAGNOSIS — K219 Gastro-esophageal reflux disease without esophagitis: Secondary | ICD-10-CM | POA: Diagnosis present

## 2018-02-23 DIAGNOSIS — I739 Peripheral vascular disease, unspecified: Secondary | ICD-10-CM | POA: Diagnosis present

## 2018-02-23 HISTORY — PX: APPENDECTOMY: SHX54

## 2018-02-23 HISTORY — PX: LAPAROSCOPIC SIGMOID COLECTOMY: SHX5928

## 2018-02-23 LAB — ABO/RH: ABO/RH(D): A NEG

## 2018-02-23 SURGERY — COLECTOMY, SIGMOID, LAPAROSCOPIC
Anesthesia: General | Wound class: Clean Contaminated

## 2018-02-23 MED ORDER — SUGAMMADEX SODIUM 500 MG/5ML IV SOLN
INTRAVENOUS | Status: AC
  Filled 2018-02-23: qty 5

## 2018-02-23 MED ORDER — SODIUM CHLORIDE 0.9 % IV SOLN
INTRAVENOUS | Status: DC
  Administered 2018-02-23 (×2): via INTRAVENOUS

## 2018-02-23 MED ORDER — PAROXETINE HCL 30 MG PO TABS
30.0000 mg | ORAL_TABLET | Freq: Every day | ORAL | Status: DC
  Administered 2018-02-23 – 2018-02-24 (×2): 30 mg via ORAL
  Filled 2018-02-23 (×3): qty 1

## 2018-02-23 MED ORDER — PROPOFOL 10 MG/ML IV BOLUS
INTRAVENOUS | Status: AC
  Filled 2018-02-23: qty 20

## 2018-02-23 MED ORDER — MIDAZOLAM HCL 2 MG/2ML IJ SOLN
INTRAMUSCULAR | Status: DC | PRN
  Administered 2018-02-23: 2 mg via INTRAVENOUS

## 2018-02-23 MED ORDER — LACTATED RINGERS IV SOLN: INTRAVENOUS | Status: DC

## 2018-02-23 MED ORDER — SUCCINYLCHOLINE CHLORIDE 20 MG/ML IJ SOLN
INTRAMUSCULAR | Status: AC
  Filled 2018-02-23: qty 1

## 2018-02-23 MED ORDER — DEXAMETHASONE SODIUM PHOSPHATE 10 MG/ML IJ SOLN
INTRAMUSCULAR | Status: AC
  Filled 2018-02-23: qty 1

## 2018-02-23 MED ORDER — TRAZODONE HCL 100 MG PO TABS
100.0000 mg | ORAL_TABLET | Freq: Every day | ORAL | Status: DC
  Administered 2018-02-24: 100 mg via ORAL
  Filled 2018-02-23 (×2): qty 1

## 2018-02-23 MED ORDER — LIDOCAINE HCL (PF) 2 % IJ SOLN
INTRAMUSCULAR | Status: AC
  Filled 2018-02-23: qty 10

## 2018-02-23 MED ORDER — LIDOCAINE HCL (CARDIAC) PF 100 MG/5ML IV SOSY
PREFILLED_SYRINGE | INTRAVENOUS | Status: DC | PRN
  Administered 2018-02-23: 50 mg via INTRAVENOUS

## 2018-02-23 MED ORDER — ROCURONIUM BROMIDE 50 MG/5ML IV SOLN
INTRAVENOUS | Status: AC
  Filled 2018-02-23: qty 1

## 2018-02-23 MED ORDER — FENTANYL CITRATE (PF) 250 MCG/5ML IJ SOLN
INTRAMUSCULAR | Status: AC
  Filled 2018-02-23: qty 5

## 2018-02-23 MED ORDER — PHENYLEPHRINE HCL 10 MG/ML IJ SOLN
INTRAMUSCULAR | Status: DC | PRN
  Administered 2018-02-23: 100 ug via INTRAVENOUS

## 2018-02-23 MED ORDER — ONDANSETRON HCL 4 MG/2ML IJ SOLN
INTRAMUSCULAR | Status: AC
  Filled 2018-02-23: qty 2

## 2018-02-23 MED ORDER — FENTANYL CITRATE (PF) 100 MCG/2ML IJ SOLN: 25.0000 ug | INTRAMUSCULAR | Status: DC | PRN

## 2018-02-23 MED ORDER — FAMOTIDINE 20 MG PO TABS
20.0000 mg | ORAL_TABLET | Freq: Once | ORAL | Status: AC
  Administered 2018-02-23: 20 mg via ORAL

## 2018-02-23 MED ORDER — LEVOTHYROXINE SODIUM 88 MCG PO TABS
88.0000 ug | ORAL_TABLET | Freq: Every day | ORAL | Status: DC
  Administered 2018-02-24 – 2018-02-25 (×2): 88 ug via ORAL
  Filled 2018-02-23 (×2): qty 1

## 2018-02-23 MED ORDER — BUPIVACAINE-EPINEPHRINE 0.5% -1:200000 IJ SOLN
INTRAMUSCULAR | Status: DC | PRN
  Administered 2018-02-23: 30 mL

## 2018-02-23 MED ORDER — BUPROPION HCL ER (XL) 150 MG PO TB24
300.0000 mg | ORAL_TABLET | Freq: Every day | ORAL | Status: DC
  Administered 2018-02-23 – 2018-02-24 (×2): 300 mg via ORAL
  Filled 2018-02-23 (×2): qty 2

## 2018-02-23 MED ORDER — DEXAMETHASONE SODIUM PHOSPHATE 10 MG/ML IJ SOLN
INTRAMUSCULAR | Status: DC | PRN
  Administered 2018-02-23: 5 mg via INTRAVENOUS

## 2018-02-23 MED ORDER — SUCCINYLCHOLINE CHLORIDE 20 MG/ML IJ SOLN
INTRAMUSCULAR | Status: DC | PRN
  Administered 2018-02-23: 160 mg via INTRAVENOUS

## 2018-02-23 MED ORDER — FENTANYL CITRATE (PF) 100 MCG/2ML IJ SOLN
INTRAMUSCULAR | Status: AC
  Filled 2018-02-23: qty 2

## 2018-02-23 MED ORDER — LACTATED RINGERS IV SOLN
INTRAVENOUS | Status: DC
  Administered 2018-02-23 – 2018-02-24 (×2): via INTRAVENOUS

## 2018-02-23 MED ORDER — OXYCODONE HCL 5 MG/5ML PO SOLN: 5.0000 mg | Freq: Once | ORAL | Status: DC | PRN

## 2018-02-23 MED ORDER — SUGAMMADEX SODIUM 500 MG/5ML IV SOLN
INTRAVENOUS | Status: DC | PRN
  Administered 2018-02-23: 237.6 mg via INTRAVENOUS

## 2018-02-23 MED ORDER — OXYCODONE HCL 5 MG PO TABS
5.0000 mg | ORAL_TABLET | ORAL | Status: DC | PRN
  Administered 2018-02-23 – 2018-02-24 (×2): 5 mg via ORAL
  Filled 2018-02-23 (×2): qty 1

## 2018-02-23 MED ORDER — ASPIRIN EC 81 MG PO TBEC
81.0000 mg | DELAYED_RELEASE_TABLET | Freq: Every day | ORAL | Status: DC
  Administered 2018-02-23 – 2018-02-25 (×3): 81 mg via ORAL
  Filled 2018-02-23 (×3): qty 1

## 2018-02-23 MED ORDER — CELECOXIB 200 MG PO CAPS
200.0000 mg | ORAL_CAPSULE | ORAL | Status: AC
  Administered 2018-02-23: 200 mg via ORAL

## 2018-02-23 MED ORDER — GABAPENTIN 300 MG PO CAPS
300.0000 mg | ORAL_CAPSULE | ORAL | Status: AC
  Administered 2018-02-23: 300 mg via ORAL

## 2018-02-23 MED ORDER — CELECOXIB 200 MG PO CAPS
ORAL_CAPSULE | ORAL | Status: AC
  Administered 2018-02-23: 200 mg via ORAL
  Filled 2018-02-23: qty 1

## 2018-02-23 MED ORDER — ACETAMINOPHEN 10 MG/ML IV SOLN
INTRAVENOUS | Status: DC | PRN
  Administered 2018-02-23: 1000 mg via INTRAVENOUS

## 2018-02-23 MED ORDER — KETAMINE HCL 10 MG/ML IJ SOLN
INTRAMUSCULAR | Status: DC | PRN
  Administered 2018-02-23 (×2): 25 mg via INTRAVENOUS
  Administered 2018-02-23: 10 mg via INTRAVENOUS

## 2018-02-23 MED ORDER — EPHEDRINE SULFATE 50 MG/ML IJ SOLN
INTRAMUSCULAR | Status: AC
  Filled 2018-02-23: qty 1

## 2018-02-23 MED ORDER — ENSURE PRE-SURGERY PO LIQD
296.0000 mL | Freq: Once | ORAL | Status: DC
  Filled 2018-02-23: qty 296

## 2018-02-23 MED ORDER — FAMOTIDINE 20 MG PO TABS
ORAL_TABLET | ORAL | Status: AC
  Administered 2018-02-23: 20 mg via ORAL
  Filled 2018-02-23: qty 1

## 2018-02-23 MED ORDER — AMLODIPINE BESYLATE 10 MG PO TABS
10.0000 mg | ORAL_TABLET | Freq: Every day | ORAL | Status: DC
  Administered 2018-02-23 – 2018-02-24 (×2): 10 mg via ORAL
  Filled 2018-02-23 (×2): qty 1

## 2018-02-23 MED ORDER — MIDAZOLAM HCL 2 MG/2ML IJ SOLN
INTRAMUSCULAR | Status: AC
  Filled 2018-02-23: qty 2

## 2018-02-23 MED ORDER — OXYCODONE HCL 5 MG PO TABS: 5.0000 mg | ORAL_TABLET | Freq: Once | ORAL | Status: DC | PRN

## 2018-02-23 MED ORDER — ROCURONIUM BROMIDE 100 MG/10ML IV SOLN
INTRAVENOUS | Status: DC | PRN
  Administered 2018-02-23: 20 mg via INTRAVENOUS
  Administered 2018-02-23: 10 mg via INTRAVENOUS
  Administered 2018-02-23: 5 mg via INTRAVENOUS
  Administered 2018-02-23 (×2): 10 mg via INTRAVENOUS
  Administered 2018-02-23: 45 mg via INTRAVENOUS

## 2018-02-23 MED ORDER — BUPIVACAINE-EPINEPHRINE (PF) 0.5% -1:200000 IJ SOLN
INTRAMUSCULAR | Status: AC
  Filled 2018-02-23: qty 30

## 2018-02-23 MED ORDER — ENOXAPARIN SODIUM 40 MG/0.4ML ~~LOC~~ SOLN
40.0000 mg | SUBCUTANEOUS | Status: DC
  Administered 2018-02-24 – 2018-02-25 (×2): 40 mg via SUBCUTANEOUS
  Filled 2018-02-23 (×2): qty 0.4

## 2018-02-23 MED ORDER — PROPOFOL 10 MG/ML IV BOLUS
INTRAVENOUS | Status: DC | PRN
  Administered 2018-02-23: 50 mg via INTRAVENOUS
  Administered 2018-02-23: 150 mg via INTRAVENOUS

## 2018-02-23 MED ORDER — GLYCOPYRROLATE 0.2 MG/ML IJ SOLN
INTRAMUSCULAR | Status: DC | PRN
  Administered 2018-02-23 (×2): 0.1 mg via INTRAVENOUS

## 2018-02-23 MED ORDER — ONDANSETRON HCL 4 MG/2ML IJ SOLN
4.0000 mg | INTRAMUSCULAR | Status: DC | PRN
  Administered 2018-02-23: 4 mg via INTRAVENOUS
  Filled 2018-02-23: qty 2

## 2018-02-23 MED ORDER — GLYCOPYRROLATE 0.2 MG/ML IJ SOLN
INTRAMUSCULAR | Status: AC
  Filled 2018-02-23: qty 1

## 2018-02-23 MED ORDER — GABAPENTIN 300 MG PO CAPS
ORAL_CAPSULE | ORAL | Status: AC
  Administered 2018-02-23: 300 mg via ORAL
  Filled 2018-02-23: qty 1

## 2018-02-23 MED ORDER — MORPHINE SULFATE (PF) 2 MG/ML IV SOLN
2.0000 mg | INTRAVENOUS | Status: DC | PRN
  Administered 2018-02-23: 2 mg via INTRAVENOUS
  Filled 2018-02-23: qty 1

## 2018-02-23 MED ORDER — LISINOPRIL 20 MG PO TABS
30.0000 mg | ORAL_TABLET | Freq: Every day | ORAL | Status: DC
  Administered 2018-02-23 – 2018-02-24 (×2): 30 mg via ORAL
  Filled 2018-02-23 (×2): qty 1

## 2018-02-23 MED ORDER — ACETAMINOPHEN 10 MG/ML IV SOLN
INTRAVENOUS | Status: AC
  Filled 2018-02-23: qty 100

## 2018-02-23 MED ORDER — ONDANSETRON HCL 4 MG/2ML IJ SOLN
INTRAMUSCULAR | Status: DC | PRN
  Administered 2018-02-23: 4 mg via INTRAVENOUS

## 2018-02-23 MED ORDER — FENTANYL CITRATE (PF) 100 MCG/2ML IJ SOLN
INTRAMUSCULAR | Status: DC | PRN
  Administered 2018-02-23: 100 ug via INTRAVENOUS
  Administered 2018-02-23: 50 ug via INTRAVENOUS
  Administered 2018-02-23: 100 ug via INTRAVENOUS

## 2018-02-23 SURGICAL SUPPLY — 85 items
APPLIER CLIP ROT 10 11.4 M/L (STAPLE)
BAG COUNTER SPONGE EZ (MISCELLANEOUS) IMPLANT
BENZOIN TINCTURE PRP APPL 2/3 (GAUZE/BANDAGES/DRESSINGS) ×3 IMPLANT
BLADE SURG 10 STRL SS SAFETY (BLADE) ×3 IMPLANT
BLADE SURG 11 STRL SS SAFETY (MISCELLANEOUS) ×3 IMPLANT
BLADE SURG SZ20 CARB STEEL (BLADE) IMPLANT
CANISTER SUCT 1200ML W/VALVE (MISCELLANEOUS) ×3 IMPLANT
CANNULA DILATOR 10 W/SLV (CANNULA) ×3 IMPLANT
CHLORAPREP W/TINT 26ML (MISCELLANEOUS) ×3 IMPLANT
CLEANER CAUTERY TIP 5X5 PAD (MISCELLANEOUS) IMPLANT
CLIP APPLIE ROT 10 11.4 M/L (STAPLE) IMPLANT
COVER CLAMP SIL LG PBX B (MISCELLANEOUS) IMPLANT
DEVICE HAND ACCESS DEXTUS (MISCELLANEOUS) IMPLANT
DISSECTOR KITTNER STICK (MISCELLANEOUS) ×2 IMPLANT
DISSECTORS/KITTNER STICK (MISCELLANEOUS) ×3
DRAPE LAP W/FLUID (DRAPES) ×3 IMPLANT
DRAPE UNDER BUTTOCK W/FLU (DRAPES) ×3 IMPLANT
DRSG OPSITE POSTOP 4X10 (GAUZE/BANDAGES/DRESSINGS) IMPLANT
DRSG OPSITE POSTOP 4X8 (GAUZE/BANDAGES/DRESSINGS) IMPLANT
DRSG TEGADERM 2-3/8X2-3/4 SM (GAUZE/BANDAGES/DRESSINGS) IMPLANT
DRSG TEGADERM 4X4.75 (GAUZE/BANDAGES/DRESSINGS) IMPLANT
DRSG TELFA 3X8 NADH (GAUZE/BANDAGES/DRESSINGS) IMPLANT
ELECT BLADE 6.5 EXT (BLADE) IMPLANT
ELECT CAUTERY BLADE 6.4 (BLADE) ×3 IMPLANT
ELECT REM PT RETURN 9FT ADLT (ELECTROSURGICAL) ×3
ELECTRODE REM PT RTRN 9FT ADLT (ELECTROSURGICAL) ×2 IMPLANT
FILTER LAP SMOKE EVAC STRL (MISCELLANEOUS) IMPLANT
GLOVE BIO SURGEON STRL SZ7 (GLOVE) ×9 IMPLANT
GLOVE BIO SURGEON STRL SZ7.5 (GLOVE) IMPLANT
GLOVE INDICATOR 8.0 STRL GRN (GLOVE) ×9 IMPLANT
GLOVE PROTEXIS LATEX SZ 7.5 (GLOVE) ×3 IMPLANT
GOWN STRL REUS W/ TWL LRG LVL3 (GOWN DISPOSABLE) ×12 IMPLANT
GOWN STRL REUS W/TWL LRG LVL3 (GOWN DISPOSABLE) ×6
HANDLE YANKAUER SUCT BULB TIP (MISCELLANEOUS) ×3 IMPLANT
IRRIGATION STRYKERFLOW (MISCELLANEOUS) IMPLANT
IRRIGATOR STRYKERFLOW (MISCELLANEOUS)
IV LACTATED RINGERS 1000ML (IV SOLUTION) IMPLANT
KIT PINK PAD W/HEAD ARE REST (MISCELLANEOUS) ×3
KIT PINK PAD W/HEAD ARM REST (MISCELLANEOUS) ×2 IMPLANT
KIT TURNOVER CYSTO (KITS) ×3 IMPLANT
LABEL OR SOLS (LABEL) IMPLANT
LIGASURE LAP MARYLAND 5MM 37CM (ELECTROSURGICAL) IMPLANT
NDL INSUFF ACCESS 14 VERSASTEP (NEEDLE) ×3 IMPLANT
NEEDLE HYPO 22GX1.5 SAFETY (NEEDLE) ×3 IMPLANT
NS IRRIG 500ML POUR BTL (IV SOLUTION) ×3 IMPLANT
PACK COLON CLEAN CLOSURE (MISCELLANEOUS) ×3 IMPLANT
PACK LAP CHOLECYSTECTOMY (MISCELLANEOUS) ×3 IMPLANT
PAD CLEANER CAUTERY TIP 5X5 (MISCELLANEOUS)
PAD PREP 24X41 OB/GYN DISP (PERSONAL CARE ITEMS) ×3 IMPLANT
PENCIL ELECTRO HAND CTR (MISCELLANEOUS) ×3 IMPLANT
PROT DEXTUS HAND ACCESS (MISCELLANEOUS)
RETAINER VISCERA MED (MISCELLANEOUS) IMPLANT
RETRACTOR FIXED LENGTH SML (MISCELLANEOUS) IMPLANT
RETRACTOR WND ALEXIS-O 25 LRG (MISCELLANEOUS) ×2 IMPLANT
RETRACTOR WOUND ALXS 18CM MED (MISCELLANEOUS) IMPLANT
RTRCTR WOUND ALEXIS O 18CM MED (MISCELLANEOUS)
RTRCTR WOUND ALEXIS O 25CM LRG (MISCELLANEOUS) ×3
SCISSORS METZENBAUM CVD 33 (INSTRUMENTS) IMPLANT
SET YANKAUER POOLE SUCT (MISCELLANEOUS) ×3 IMPLANT
SHEARS HARMONIC ACE PLUS 36CM (ENDOMECHANICALS) ×3 IMPLANT
SPONGE LAP 18X18 5 PK (GAUZE/BANDAGES/DRESSINGS) ×6 IMPLANT
STAPLER PROXIMATE 75MM BLUE (STAPLE) ×3 IMPLANT
STAPLER SKIN PROX 35W (STAPLE) ×3 IMPLANT
STAPLER TA28 THK CONTR EEA XL (STAPLE) ×3 IMPLANT
STRIP CLOSURE SKIN 1/2X4 (GAUZE/BANDAGES/DRESSINGS) ×3 IMPLANT
SUT MAXON ABS #0 GS21 30IN (SUTURE) ×12 IMPLANT
SUT PROLENE 3 0 SH DA (SUTURE) ×3 IMPLANT
SUT SILK 2 0 (SUTURE) ×1
SUT SILK 2-0 30XBRD TIE 12 (SUTURE) ×2 IMPLANT
SUT SILK 3-0 (SUTURE) ×6 IMPLANT
SUT VIC AB 2-0 BRD 54 (SUTURE) ×3 IMPLANT
SUT VIC AB 2-0 CT1 27 (SUTURE) ×2
SUT VIC AB 2-0 CT1 TAPERPNT 27 (SUTURE) ×4 IMPLANT
SUT VIC AB 3-0 54X BRD REEL (SUTURE) ×2 IMPLANT
SUT VIC AB 3-0 BRD 54 (SUTURE) ×1
SUT VIC AB 3-0 SH 27 (SUTURE) ×1
SUT VIC AB 3-0 SH 27X BRD (SUTURE) ×2 IMPLANT
SUT VIC AB 4-0 FS2 27 (SUTURE) IMPLANT
SYR 10ML LL (SYRINGE) ×3 IMPLANT
SYR BULB IRRIG 60ML STRL (SYRINGE) IMPLANT
TRAY FOLEY MTR SLVR 16FR STAT (SET/KITS/TRAYS/PACK) ×3 IMPLANT
TROCAR XCEL NON-BLD 11X100MML (ENDOMECHANICALS) ×3 IMPLANT
TROCAR XCEL NON-BLD 5MMX100MML (ENDOMECHANICALS) ×3 IMPLANT
TROCAR XCEL UNIV SLVE 11M 100M (ENDOMECHANICALS) ×3 IMPLANT
TUBING INSUF HEATED (TUBING) ×3 IMPLANT

## 2018-02-23 NOTE — Anesthesia Post-op Follow-up Note (Signed)
Anesthesia QCDR form completed.        

## 2018-02-23 NOTE — Op Note (Signed)
Preoperative diagnosis: Stricture of the sigmoid colon.  Postoperative diagnosis: Same.  Operative procedure: Laparoscopically assisted sigmoid colectomy, incidental appendectomy.  Operating Surgeon: Hervey Ard, MD.  Assistant: Nestor Lewandowsky, MD.  Anesthesia: General endotracheal.  Estimated blood loss: 100 cc.  Intravenous fluids 1000 cc.  Clinical note: This 57 year old woman is developed a stricture of the sigmoid colon thought secondary to diverticulitis.  She is failed conservative management it was felt to be a candidate for colectomy.  Risk factors include massive obesity.  Operative note: The patient received Invanz prior to the procedure.  She had SCD stockings for DVT prevention.  She underwent general endotracheal anesthesia and tolerated this well.  She was placed in dorsolithotomy position and the abdomen prepped with ChloraPrep and the perineum cleansed with Betadine solution.  The abdomen was draped and the patient placed in Trendelenburg position.  A varies needle was placed through a trans-umbilical incision.  After assuring intra-abdominal location with a hanging drop test the abdomen was insufflated with CO2 at 10 mmHg pressure.  A 10 mm Step port was expanded.  Inspection showed no evidence of injury from initial port placement.  There was a band of adhesion to the midline thought likely secondary to previous hysterectomy.  An 11 mm XL port was placed in the left lower quadrant as well as in the right lower quadrant.  A 5 mm XL port was placed in the left upper quadrant just off the midline.  The adhesions of the omentum to the anterior abdominal wall were taken down with the harmonic scalpel.  The sigmoid colon was identified at its junction with the descending colon.  The white line of Toldt was divided and the left colon mobilized up to and around the splenic flexure.  The dissection down into the pelvis was then evaluated and with dense adhesions it was elected to  convert at this time to the planned open portion of the procedure.  A generous lower midline incision was made after instillation of 30 cc of 0.5% Marcaine with 1-200,000 of epinephrine.  The skin was incised sharply and the remaining dissection completed with electrocautery.  The midline fascia was opened.  A large Alexis wound protector was placed.  The small bowel was swept superiorly and a wet lap followed by a malleable retractor used to hold this out of the field.  The dense adhesions were to the area of the vaginal cuff and less so to the posterior wall the bladder.  The dissection was maintained adjacent to the bowel wall.  The normal part of the colon was divided but with a GIA stapler and then the mesentery taken down with hemostasis of the sigmoid artery with a 2-0 silk tie x2.  The rectum was mobilized and a soft area distal to the area of stricture formation was identified.  The bowel was transected between Glassman clamps and the distal bowel found to be very soft and normal.  The proximal bowel easily reached the pelvis having previously been mobilized to the splenic flexure.  The EEA sizers were used and a 28 mm stapling device was chosen.  A 3-0 Prolene baseball stitch was placed into the rectum followed by the anvil and tightening of the suture with good approximation to the shaft.  A colotomy was made along the tinea on the sigmoid and the stapler gently advanced.  The spike was placed just off the prior staple line, the stapler closed within the green Vernier marking and fired.  2 complete donuts were  obtained.  The colotomy was closed with 2 layers, the first with a running 3-0 Vicryl full-thickness closure and then a layer of interrupted 3-0 silk seromuscular sutures.  A rigid sigmoidoscope was passed per anus and with a Glassman clamp proximal to the colotomy the pelvis was filled with water and in the rectum insufflated with air.  Good distention was noted with no leak.  At this time the  gown gloves and drapes were changed.  All the sponge tape and instrument count were correct.  The Sutherland wound protector was removed.  After redraping and gowning the peritoneum was closed with a running 0 Vicryl suture.  The midline fascia was then approximated with interrupted 0 Maxon figure-of-eight sutures.  The wound was irrigated and the easily 5 inch layer of adipose tissue closed with 2 layers of 2-0 Vicryl running suture.  Staples were used for all sites.  Telfa and Tegaderm dressing applied to the port sites and a honeycomb dressing applied to the midline wound.  Foley catheter was removed as was the previously placed orogastric tube.  The patient tolerated the procedure well and was taken recovery in stable condition.

## 2018-02-23 NOTE — Anesthesia Preprocedure Evaluation (Signed)
Anesthesia Evaluation  Patient identified by MRN, date of birth, ID band Patient awake    Reviewed: Allergy & Precautions, H&P , NPO status , Patient's Chart, lab work & pertinent test results  History of Anesthesia Complications Negative for: history of anesthetic complications  Airway Mallampati: III  TM Distance: <3 FB Neck ROM: full    Dental  (+) Chipped, Poor Dentition, Missing, Edentulous Upper   Pulmonary neg shortness of breath,           Cardiovascular Exercise Tolerance: Good hypertension, (-) angina+ Peripheral Vascular Disease  (-) Past MI      Neuro/Psych PSYCHIATRIC DISORDERS Anxiety Depression  Neuromuscular disease    GI/Hepatic Neg liver ROS, GERD  ,  Endo/Other  negative endocrine ROSHypothyroidism   Renal/GU Renal disease     Musculoskeletal  (+) Arthritis ,   Abdominal   Peds  Hematology negative hematology ROS (+)   Anesthesia Other Findings Past Medical History: 05/19/2017: Acute kidney injury ([place]) 08/20/2016: Acute myofascial strain of lumbosacral region No date: Allergic rhinitis No date: Anemia, iron deficiency 04/05/2008: ANEMIA, IRON DEFICIENCY, history of     Comment:  Qualifier: Diagnosis of  By: Philbert Riser MD, Manrique   No date: Anginal pain [facility])     Comment:  last cardiologist visit 2010 - dx/ed with anxiety               instead of cardiac pain No date: Arthritis No date: Atypical chest pain     Comment:  Negative cardiolite at [facility] cards 11/07 01/07/2017: BPPV (benign paroxysmal positional vertigo) No date: CHOLECYSTECTOMY, HX OF     Comment:  Annotation: 2000 Qualifier: Diagnosis of  ByEddie Dibbles MD,               Bhakti   09/22/2012: Chronic left maxillary sinus disease 11/08/2015: Chronic low back pain No date: Depression 05/19/2017: Diarrhea No date: Diverticulitis No date: Elevated BP 09/22/2012: Family history of leukemia     Comment:  She states that both her uncle and  his son was diagnosed              with Leukemia. She would like to know more information               about the leukemia.      No date: GERD (gastroesophageal reflux disease)     Comment:  no meds 09/22/2012: Hepatic steatosis     Comment:  Incidental finding on abd CT in 2011  03/2008: History of blood transfusion     Comment:  Requried 2u prbc; menometrorrhagia, uterine fibroids. 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 248 lbs. 11/2004 Qualifier: Diagnosis of              By: Eddie Dibbles MD, Bhakti   11/05: Panic disorder 08/31/2006: PANIC DISORDER  Comment:  Diagnosed 11/05, well-controlled with Paxil, patient               also takes albuterol when she has a panic attack, maybe               once/month  09/22/2012: Pectus excavatum     Comment:  Incidental finding on CXR in 2013.   11/14/2011: Perirectal abscess 09/22/2012: Prediabetes 02/09/2014: Preventative health care 07/17/2012: Proteinuria 12/17/2016: Urinary incontinence without sensory awareness 11/08/2015: Venous (peripheral) insufficiency  Past Surgical History: 04/2007: ABDOMINAL HYSTERECTOMY     Comment:  total abdominal hysterectomy ( cervix) with bilateral               salpingo-oopherectomy 07/30/1999: CHOLECYSTECTOMY     Comment:  Diagnosis of  By: Eddie Dibbles MD, Bhakti    1987: DILATION AND CURETTAGE OF UTERUS 11/02/2012: INCISION AND DRAINAGE ABSCESS     Comment:  Procedure: INCISION AND DRAINAGE ABSCESS;  Surgeon: Odis Hollingshead, MD;  Location: [place];  Service: General;                Laterality: N/A;  I&D anorectal abscess 11/14/2011: INCISION AND DRAINAGE PERIRECTAL ABSCESS     Comment:  Procedure: IRRIGATION AND DEBRIDEMENT PERIRECTAL               ABSCESS;  Surgeon: Zenovia Jarred, MD;  Location: [place];  Service: General;  Laterality: N/A; 06/11/2013: NERVE, TENDON AND ARTERY REPAIR; Left     Comment:  Procedure: EXPLORATION AND REPAIR LEFT FOREARM;                Surgeon: Schuyler Amor, MD;  Location: [place];                Service: Orthopedics;  Laterality: Left; 03/06/2016: SHOULDER ARTHROSCOPY WITH ROTATOR CUFF REPAIR AND  SUBACROMIAL DECOMPRESSION; Left     Comment:  Procedure: LEFT SHOULDER ARTHROSCOPY WITH ROTATOR CUFF               REPAIR AND SUBACROMIAL DECOMPRESSION, DISTAL CLAVICLE               EXCISION;  Surgeon: Leandrew Koyanagi, MD;  Location: [place];                Service: Orthopedics;  Laterality: Left;     Reproductive/Obstetrics negative OB ROS                             Anesthesia Physical Anesthesia Plan  ASA: III  Anesthesia Plan: General ETT   Post-op Pain Management:    Induction: Intravenous  PONV Risk Score and Plan: Ondansetron, Dexamethasone and Midazolam  Airway Management Planned: Oral ETT and Video Laryngoscope Planned  Additional Equipment:   Intra-op Plan:   Post-operative Plan: Extubation in OR  Informed Consent: I have reviewed the patients History and Physical, chart, labs and discussed the procedure including the risks, benefits and alternatives for the proposed anesthesia with the patient or authorized representative who has indicated his/her understanding and acceptance.   Dental Advisory Given  Plan Discussed with: Anesthesiologist,  CRNA and Surgeon  Anesthesia Plan Comments: (Patient consented for risks of anesthesia including but not limited to:  - adverse reactions to medications - damage to teeth, lips or other oral mucosa -  sore throat or hoarseness - Damage to heart, brain, lungs or loss of life  Patient voiced understanding.)        Anesthesia Quick Evaluation

## 2018-02-23 NOTE — Transfer of Care (Signed)
Immediate Anesthesia Transfer of Care Note  Patient: [name]  Procedure(s) Performed: LAPAROSCOPIC ASSISTED SIGMOID COLECTOMY (N/A ) APPENDECTOMY  Patient Location: PACU  Anesthesia Type:General  Level of Consciousness: sedated  Airway & Oxygen Therapy: Patient Spontanous Breathing and Patient connected to face mask oxygen  Post-op Assessment: Report given to RN and Post -op Vital signs reviewed and stable  Post vital signs: Reviewed and stable  Last Vitals:  Vitals Value Taken Time  BP 124/78 02/23/2018  4:19 PM  Temp    Pulse 91 02/23/2018  4:20 PM  Resp 19 02/23/2018  4:20 PM  SpO2 94 % 02/23/2018  4:20 PM  Vitals shown include unvalidated device data.  Last Pain:  Vitals:   02/23/18 1111  TempSrc: Tympanic  PainSc: 0-No pain      Patients Stated Pain Goal: 0 (70/14/10 3013)  Complications: No apparent anesthesia complications

## 2018-02-23 NOTE — Anesthesia Postprocedure Evaluation (Signed)
Anesthesia Post Note  Patient: [name]  Procedure(s) Performed: LAPAROSCOPIC ASSISTED SIGMOID COLECTOMY (N/A ) APPENDECTOMY  Patient location during evaluation: PACU Anesthesia Type: General Level of consciousness: awake and alert Pain management: pain level controlled Vital Signs Assessment: post-procedure vital signs reviewed and stable Respiratory status: spontaneous breathing and respiratory function stable Cardiovascular status: stable Anesthetic complications: no     Last Vitals:  Vitals:   02/23/18 1709 02/23/18 1735  BP: 125/79 131/76  Pulse: 79 84  Resp: 13 18  Temp: (!) 36.3 C 36.4 C  SpO2: 98% 99%    Last Pain:  Vitals:   02/23/18 1736  TempSrc:   PainSc: 3                  KEPHART,WILLIAM K

## 2018-02-23 NOTE — Anesthesia Procedure Notes (Signed)
Procedure Name: Intubation Date/Time: 02/23/2018 12:15 PM Performed by: Nile Riggs, CRNA Pre-anesthesia Checklist: Patient identified, Emergency Drugs available, Suction available, Patient being monitored and Timeout performed Oxygen Delivery Method: Circle system utilized Preoxygenation: Pre-oxygenation with 100% oxygen Induction Type: IV induction and Cricoid Pressure applied Ventilation: Mask ventilation without difficulty and Oral airway inserted - appropriate to patient size Laryngoscope Size: Mac and 3 Grade View: Grade I Tube type: Oral Tube size: 7.5 mm Number of attempts: 1 Airway Equipment and Method: Stylet Placement Confirmation: ETT inserted through vocal cords under direct vision,  positive ETCO2 and breath sounds checked- equal and bilateral Secured at: 21 cm Tube secured with: Tape Dental Injury: Teeth and Oropharynx as per pre-operative assessment  Comments: Intubation performed by [place]

## 2018-02-23 NOTE — Progress Notes (Signed)
Pharmacy consulted for lovenox dosing. No indication noted and RN was unaware of such. Paged Dr. Bary Castilla x 1. No response. Lovenox order has already been placed for 40 mg subcutaneously once daily. Labs from 02/18/18. No follow up labs ordered. Please reconsult and include indication if you would like dosing assistance from pharmacy.  Chananya Canizalez A. [place], [place].D., BCPS Clinical Pharmacist 02/23/18 18:17

## 2018-02-24 ENCOUNTER — Encounter: Payer: Self-pay | Admitting: General Surgery

## 2018-02-24 MED ORDER — ACETAMINOPHEN 325 MG PO TABS: 650.0000 mg | ORAL_TABLET | ORAL | Status: DC | PRN

## 2018-02-24 MED ORDER — TRAMADOL HCL 50 MG PO TABS: 50.0000 mg | ORAL_TABLET | Freq: Four times a day (QID) | ORAL | Status: DC | PRN

## 2018-02-24 MED ORDER — OXYCODONE HCL 5 MG PO TABS
5.0000 mg | ORAL_TABLET | ORAL | Status: DC | PRN
  Administered 2018-02-24 (×2): 5 mg via ORAL
  Filled 2018-02-24 (×2): qty 1

## 2018-02-24 NOTE — Progress Notes (Signed)
Afebrile. BP low normal, have re instituted all regular BP meds except diuretic. A little dizzy first time up yesterday, no recurrent issues. No nausea. Tolerated liquids well. Reports drinking well, no oral intake recorded. Excellent urine output. Lungs: Clear. Inspirex at 1500.  Cardio: RR. ABD: Non-distended, soft. BS+. No flatus as of yet.  Extrem: Soft. IMP: Doing well. Plan: Full liquid diet. Continue ambulation. Tentative d/c POD 3-4.

## 2018-02-24 NOTE — Plan of Care (Signed)
Pt ambulating frequently in hallway. Pain is controlled by prn oxycodone. Incisions intact with some old drainage. Tolerating full liquid diet. PT does have some small amount of blood in her stools but MD is aware and pt is asymptomatic.

## 2018-02-25 LAB — SURGICAL PATHOLOGY

## 2018-02-25 NOTE — Progress Notes (Signed)
AVSS. BP trending to normal.  Tolerated full liquids well. BM x4. Lungs: Clear. Cardio: RR. ABD: Non-distended,soft. Wounds: Clean. Extrem: Soft. Inspirex at 2000. IMP: Doing well. Plan: Advance diet. Tentative d/c this afternoon.

## 2018-02-25 NOTE — Progress Notes (Signed)
[name] to be D/C'd home per MD order.  Discussed prescriptions and follow up appointments with the patient. Prescriptions given to patient, medication list explained in detail. Pt verbalized understanding.  Allergies as of 02/25/2018      Reactions   Cephalexin Itching   Doxycycline Nausea And Vomiting   Dilaudid [hydromorphone Hcl] Other (See Comments)   Panic attack   Sulfur Itching, Rash      Medication List    TAKE these medications   acetaminophen 500 MG tablet Commonly known as:  TYLENOL Take 1,000 mg by mouth every 8 (eight) hours as needed for mild pain.   amLODipine 10 MG tablet Commonly known as:  NORVASC Take 1 tablet by mouth once daily for blood pressure. What changed:    how much to take  how to take this  when to take this  additional instructions   aspirin 325 MG tablet Take 325 mg daily by mouth.   aspirin EC 81 MG tablet Take 81 mg by mouth daily.   buPROPion 300 MG 24 hr tablet Commonly known as:  WELLBUTRIN XL Take 300 mg by mouth at bedtime.   hydrochlorothiazide 25 MG tablet Commonly known as:  HYDRODIURIL Take 1 tablet (25 mg total) by mouth daily.   levothyroxine 88 MCG tablet Commonly known as:  SYNTHROID Take 1 tablet by mouth every morning on an empty stomach with a full glass of water. What changed:    how much to take  how to take this  when to take this  additional instructions   lisinopril 30 MG tablet Commonly known as:  PRINIVIL,ZESTRIL Take 1 tablet (30 mg total) by mouth daily. What changed:  when to take this   mupirocin cream 2 % Commonly known as:  BACTROBAN Apply to affected anterior nose on each side twice a day.   neomycin 500 MG tablet Commonly known as:  MYCIFRADIN Take two (2) tablets at 6 pm and two (2) tablets at 11 pm the evening prior to surgery.   PARoxetine 20 MG tablet Commonly known as:  PAXIL Take 1.5 tablets (30 mg total) by mouth daily. What changed:  when to take this    polyethylene glycol powder powder Commonly known as:  GLYCOLAX/MIRALAX 255 grams one bottle for bowel prep   traZODone 100 MG tablet Commonly known as:  DESYREL Take 100 mg by mouth at bedtime.       Vitals:   02/25/18 0434 02/25/18 1153  BP: 113/64 112/61  Pulse: 67 71  Resp: 18 16  Temp: 97.9 F (36.6 C) 98.3 F (36.8 C)  SpO2: 93% 95%    Skin clean, dry and intact without evidence of skin break down, no evidence of skin tears noted. IV catheter discontinued intact. Site without signs and symptoms of complications. Dressing and pressure applied. Pt denies pain at this time. No complaints noted.  An After Visit Summary was printed and given to the patient. Patient escorted via [place], and D/C home via private auto.  [name] A

## 2018-03-02 ENCOUNTER — Encounter: Payer: No Typology Code available for payment source | Admitting: Primary Care

## 2018-03-02 ENCOUNTER — Ambulatory Visit (INDEPENDENT_AMBULATORY_CARE_PROVIDER_SITE_OTHER): Payer: Self-pay | Admitting: *Deleted

## 2018-03-02 DIAGNOSIS — K5792 Diverticulitis of intestine, part unspecified, without perforation or abscess without bleeding: Secondary | ICD-10-CM

## 2018-03-02 NOTE — Patient Instructions (Signed)
The patient is aware to call back for any questions or concerns.  

## 2018-03-02 NOTE — Progress Notes (Signed)
Patient came in today for a wound check post sigmoid colectomy.  The wound is clean, with no signs of infection noted. Every other staple removed and steri strips applied to midline. Port sites clean staples removed and steri strips applied. Minimal bruising noted. Umbilical area with opening and clear/blood tinged drainage, gauze and tegaderm applied. Dr Bary Castilla aware. F/u appt made 03-08-18.

## 2018-03-03 ENCOUNTER — Telehealth: Payer: Self-pay

## 2018-03-03 NOTE — Telephone Encounter (Signed)
Flagged on EMMI report for not receiving discharge papers.  First attempt to reach patient made 03/03/18 at 2pm, however unable to reach patient.  Left voicemail encouraging callback. Will attempt at later time.

## 2018-03-04 ENCOUNTER — Telehealth: Payer: Self-pay | Admitting: *Deleted

## 2018-03-04 NOTE — Telephone Encounter (Signed)
-----   Message from Robert Bellow, MD sent at 03/03/2018  8:00 PM EDT ----- Please notify the patient that the report from the laboratory was fine.  Just diverticulitis.  Follow-up as scheduled. ----- Message ----- From: Interface, Lab In Three Zero One Sent: 02/25/2018   4:17 PM To: Robert Bellow, MD

## 2018-03-04 NOTE — Telephone Encounter (Signed)
Patient returned call.  She verified that she did receive her discharge papers and had no questions at this time.  Said she received terrific care during her stay.

## 2018-03-04 NOTE — Telephone Encounter (Signed)
Second attempt to reach patient made 03/04/18 at 1:25pm, however unable to reach.  Left another voicemail encouraging patient to call if she did not receive her discharge papers and would like to receive them.

## 2018-03-04 NOTE — Telephone Encounter (Signed)
Notified patient as instructed, patient agrees.  Pt is coming in June 4th at 11:00am, she rescheduled from May 28th due to transportation

## 2018-03-06 NOTE — Discharge Summary (Signed)
Physician Discharge Summary  Patient ID: [name] MRN: [id number] DOB/AGE: 1961-04-02 57 y.o.  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  She ambulated the evening of surgery.  Started on clear liquids at that time and advanced to a soft diet over the next 48 hours.  Scant narcotic requirements after surgery.    Consults: None  Significant Diagnostic Studies:  DIAGNOSIS:  A. COLON, SIGMOID; RESECTION:  - DIVERTICULOSIS.  - FIBROUS SEROSAL ADHESIONS.  - BENIGN REACTIVE LYMPH NODE.  - UNREMARKABLE MARGINS OF RESECTION.  - NEGATIVE FOR DYSPLASIA AND MALIGNANCY.   B. APPENDIX; INCIDENTAL APPENDECTOMY:  - FIBROUS OBLITERATION OF THE DISTAL LUMEN.  - NEGATIVE FOR DYSPLASIA AND MALIGNANCY.  - FOCAL SEROSAL EDEMA AND ACUTE SEROSITIS.   Treatments: IV hydration  Discharge Exam: Blood pressure 112/61, pulse 71, temperature 98.3 F (36.8 C), temperature source Oral, resp. rate 16, height 5\' 1"  (1.549 m), weight 262 lb (118.8 kg), last menstrual period 06/17/2007, SpO2 95 %. General appearance: alert and cooperative Resp: clear to auscultation bilaterally Cardio: regular rate and rhythm, S1, S2 normal, no murmur, click, rub or gallop GI: soft, non-tender; bowel sounds normal; no masses,  no organomegaly Incision/Wound: Midline incision healing well.  No evidence of hematoma or seroma.  Port sites healing well.  Disposition:   Discharge Instructions    Diet - low sodium heart healthy   Complete by:  As directed    Discharge instructions   Complete by:  As directed    Resume all of your regular medications starting on Saturday.  Tylenol/ Advil/ Aleve: As needed for soreness.  No lifting over 10 pounds.  No driving until  pain free.  You may shower at any time.  Please call the office on Monday to arrange an nurse visit on Wednesday to have your staples removed.   Increase activity slowly   Complete by:  As directed      Allergies as of 02/25/2018      Reactions   Cephalexin Itching   Doxycycline Nausea And Vomiting   Dilaudid [hydromorphone Hcl] Other (See Comments)   Panic attack   Sulfur Itching, Rash      Medication List    TAKE these medications   acetaminophen 500 MG tablet Commonly known as:  TYLENOL Take 1,000 mg by mouth every 8 (eight) hours as needed for mild pain.   amLODipine 10 MG tablet Commonly known as:  NORVASC Take 1 tablet by mouth once daily for blood pressure. What changed:    how much to take  how to take this  when to take this  additional instructions   aspirin 325 MG tablet Take 325 mg daily by mouth.   aspirin EC 81 MG tablet Take 81 mg by mouth daily.   buPROPion 300 MG 24 hr tablet Commonly known as:  WELLBUTRIN XL Take 300 mg by mouth at bedtime.   hydrochlorothiazide 25 MG tablet Commonly known as:  HYDRODIURIL Take 1 tablet (25 mg total) by mouth daily.   levothyroxine 88 MCG tablet Commonly known as:  SYNTHROID Take 1 tablet by mouth every morning on an empty stomach with a full glass of water. What changed:    how much to take  how to take this  when to take this  additional instructions  lisinopril 30 MG tablet Commonly known as:  PRINIVIL,ZESTRIL Take 1 tablet (30 mg total) by mouth daily. What changed:  when to take this   mupirocin cream 2 % Commonly known as:  BACTROBAN Apply to affected anterior nose on each side twice a day.   neomycin 500 MG tablet Commonly known as:  MYCIFRADIN Take two (2) tablets at 6 pm and two (2) tablets at 11 pm the evening prior to surgery.   PARoxetine 20 MG tablet Commonly known as:  PAXIL Take 1.5 tablets (30 mg total) by mouth daily. What changed:  when to take this   polyethylene  glycol powder powder Commonly known as:  GLYCOLAX/MIRALAX 255 grams one bottle for bowel prep   traZODone 100 MG tablet Commonly known as:  DESYREL Take 100 mg by mouth at bedtime.        Signed: Forest Gleason Shaquera Ansley 03/06/2018, 3:25 PM

## 2018-03-08 ENCOUNTER — Ambulatory Visit: Payer: Self-pay | Admitting: General Surgery

## 2018-03-14 ENCOUNTER — Ambulatory Visit: Payer: No Typology Code available for payment source | Admitting: Primary Care

## 2018-03-15 ENCOUNTER — Encounter: Payer: Self-pay | Admitting: General Surgery

## 2018-03-15 ENCOUNTER — Ambulatory Visit (INDEPENDENT_AMBULATORY_CARE_PROVIDER_SITE_OTHER): Payer: Self-pay | Admitting: General Surgery

## 2018-03-15 VITALS — BP 134/72 | HR 87 | Resp 16 | Ht 61.0 in | Wt 254.0 lb

## 2018-03-15 DIAGNOSIS — K5792 Diverticulitis of intestine, part unspecified, without perforation or abscess without bleeding: Secondary | ICD-10-CM

## 2018-03-15 NOTE — Patient Instructions (Signed)
  Return in six week. The patient is aware to call back for any questions or concerns.

## 2018-03-15 NOTE — Progress Notes (Signed)
Patient ID: [name], female   DOB: 11/10/1960, 57 y.o.   MRN: [id number]  Chief Complaint  Patient presents with  . Follow-up    HPI [name] is a 57 y.o. female here for a follow up from lap colectomy done on 02/23/18. Patient states she is doing well.  Husband, [name] is present at visit.  HPI  Past Medical History:  Diagnosis Date  . Acute kidney injury ([place]) 05/19/2017  . Acute myofascial strain of lumbosacral region 08/20/2016  . Allergic rhinitis   . Anemia, iron deficiency   . ANEMIA, IRON DEFICIENCY, history of 04/05/2008   Qualifier: Diagnosis of  By: Philbert Riser MD, Man[place]    . Anginal pain [facility])    last cardiologist visit 2010 - dx/ed with anxiety instead of cardiac pain  . Arthritis   . Atypical chest pain    Negative cardiolite at [facility] cards 11/07  . BPPV (benign paroxysmal positional vertigo) 01/07/2017  . CHOLECYSTECTOMY, HX OF    Annotation: 2000 Qualifier: Diagnosis of  By: Eddie Dibbles MD, Bhakti    . Chronic left maxillary sinus disease 09/22/2012  . Chronic low back pain 11/08/2015  . Depression   . Diarrhea 05/19/2017  . Diverticulitis   . Elevated BP   . Family history of leukemia 09/22/2012   She states that both her uncle and his son was diagnosed with Leukemia. She would like to know more information about the leukemia.       Marland Kitchen GERD (gastroesophageal reflux disease)    no meds  . Hepatic steatosis 09/22/2012   Incidental finding on abd CT in 2011   . History of blood transfusion 03/2008   Requried 2u prbc; menometrorrhagia, uterine fibroids.  . History of methicillin resistant staphylococcus aureus (MRSA) 2017   PCR+  . HTN (hypertension) 02/09/2014  . Hx of colonic polyp 10/26/2017  . Hypertension   . Hypertriglyceridemia    164 - 2/11  . Hypothyroidism   . Injury of left rotator cuff 12/02/2016  . Insomnia   . INSOMNIA UNSPECIFIED 05/01/2010   Qualifier: Diagnosis of  By: Shon Baton, MD, Janett Billow    . Kidney disease    STAGE 3-SEES DR PATEL AT  [place]  . Metabolic syndrome 68/34/1960  . Nausea 05/19/2017  . Nausea vomiting and diarrhea   . 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 Obesity    250 lbs 8/11  . OBESITY 08/31/2006   Annotation: Wt. 248 lbs. 11/2004 Qualifier: Diagnosis of  By: Eddie Dibbles MD, Bhakti    . Panic disorder 11/05  . PANIC DISORDER 08/31/2006   Diagnosed 11/05, well-controlled with Paxil, patient also takes albuterol when she has a panic attack, maybe once/month   . Pectus excavatum 09/22/2012   Incidental finding on CXR in 2013.    Marland Kitchen Perirectal abscess 11/14/2011  . Prediabetes 09/22/2012  . Preventative health care 02/09/2014  . Proteinuria 07/17/2012  . Urinary incontinence without sensory awareness 12/17/2016  . Venous (peripheral) insufficiency 11/08/2015    Past Surgical History:  Procedure Laterality Date  . ABDOMINAL HYSTERECTOMY  04/2007   total abdominal hysterectomy ( cervix) with bilateral salpingo-oopherectomy  . APPENDECTOMY  02/23/2018   Procedure: APPENDECTOMY;  Surgeon: Robert Bellow, MD;  Location: [facility] ORS;  Service: General;;  . CHOLECYSTECTOMY  07/30/1999   Diagnosis of  By: Eddie Dibbles MD, Bhakti    . DILATION AND CURETTAGE OF UTERUS  1987  . INCISION AND DRAINAGE ABSCESS  11/02/2012   Procedure: INCISION AND DRAINAGE ABSCESS;  Surgeon:  Odis Hollingshead, MD;  Location: [place];  Service: General;  Laterality: N/A;  I&D anorectal abscess  . INCISION AND DRAINAGE PERIRECTAL ABSCESS  11/14/2011   Procedure: IRRIGATION AND DEBRIDEMENT PERIRECTAL ABSCESS;  Surgeon: Zenovia Jarred, MD;  Location: [place];  Service: General;  Laterality: N/A;  . LAPAROSCOPIC SIGMOID COLECTOMY N/A 02/23/2018   Procedure: LAPAROSCOPIC ASSISTED SIGMOID COLECTOMY;  Surgeon: Robert Bellow, MD;  Location: [facility] ORS;  Service: General;  Laterality: N/A;  . NERVE, TENDON AND ARTERY REPAIR Left 06/11/2013   Procedure: EXPLORATION AND REPAIR LEFT FOREARM;  Surgeon: Schuyler Amor, MD;  Location: [place];  Service:  Orthopedics;  Laterality: Left;  . SHOULDER ARTHROSCOPY WITH ROTATOR CUFF REPAIR AND SUBACROMIAL DECOMPRESSION Left 03/06/2016   Procedure: LEFT SHOULDER ARTHROSCOPY WITH ROTATOR CUFF REPAIR AND SUBACROMIAL DECOMPRESSION, DISTAL CLAVICLE EXCISION;  Surgeon: Leandrew Koyanagi, MD;  Location: [place];  Service: Orthopedics;  Laterality: Left;    Family History  Problem Relation Age of Onset  . Heart attack Mother        at the age of 19's  . Lung cancer Mother        metestatic, + smoker  . Aneurysm Mother   . Hyperlipidemia Mother   . Hypertension Mother   . Hypertension Father   . Heart attack Maternal Grandmother        at the age of 46's  . Colon cancer Neg Hx   . Stomach cancer Neg Hx   . Esophageal cancer Neg Hx   . Rectal cancer Neg Hx     Social History Social History   Tobacco Use  . Smoking status: Never Smoker  . Smokeless tobacco: Never Used  Substance Use Topics  . Alcohol use: No    Alcohol/week: 0.0 oz  . Drug use: No    Allergies  Allergen Reactions  . Cephalexin Itching  . Doxycycline Nausea And Vomiting  . Dilaudid [Hydromorphone Hcl] Other (See Comments)    Panic attack  . Sulfur Itching and Rash    Current Outpatient Medications  Medication Sig Dispense Refill  . acetaminophen (TYLENOL) 500 MG tablet Take 1,000 mg by mouth every 8 (eight) hours as needed for mild pain.    Marland Kitchen amLODipine (NORVASC) 10 MG tablet Take 1 tablet by mouth once daily for blood pressure. (Patient taking differently: Take 10 mg by mouth at bedtime. Take 1 tablet by mouth once daily for blood pressure.) 90 tablet 3  . aspirin 325 MG tablet Take 325 mg daily by mouth.    Marland Kitchen aspirin EC 81 MG tablet Take 81 mg by mouth daily.    Marland Kitchen buPROPion (WELLBUTRIN XL) 300 MG 24 hr tablet Take 300 mg by mouth at bedtime.     . hydrochlorothiazide (HYDRODIURIL) 25 MG tablet Take 1 tablet (25 mg total) by mouth daily. 90 tablet 3  . levothyroxine (SYNTHROID) 88 MCG tablet Take 1 tablet by mouth every  morning on an empty stomach with a full glass of water. (Patient taking differently: Take 88 mcg by mouth at bedtime. Take 1 tablet by mouth every morning on an empty stomach with a full glass of water.) 90 tablet 2  . lisinopril (PRINIVIL,ZESTRIL) 30 MG tablet Take 1 tablet (30 mg total) by mouth daily. (Patient taking differently: Take 30 mg by mouth at bedtime. ) 90 tablet 2  . mupirocin cream (BACTROBAN) 2 % Apply to affected anterior nose on each side twice a day. (Patient not taking: Reported on 02/23/2018) 30  g 0  . neomycin (MYCIFRADIN) 500 MG tablet Take two (2) tablets at 6 pm and two (2) tablets at 11 pm the evening prior to surgery. 4 tablet 0  . PARoxetine (PAXIL) 20 MG tablet Take 1.5 tablets (30 mg total) by mouth daily. (Patient taking differently: Take 30 mg by mouth at bedtime. ) 135 tablet 2  . polyethylene glycol powder (GLYCOLAX/MIRALAX) powder 255 grams one bottle for bowel prep 255 g 0  . traZODone (DESYREL) 100 MG tablet Take 100 mg by mouth at bedtime.     No current facility-administered medications for this visit.     Review of Systems Review of Systems  Constitutional: Negative.   Respiratory: Negative.   Cardiovascular: Negative.     Blood pressure 134/72, pulse 87, resp. rate 16, height 5\' 1"  (1.549 m), weight 254 lb (115.2 kg), last menstrual period 06/17/2007.  Physical Exam Physical Exam  Constitutional: She is oriented to person, place, and time. She appears well-developed and well-nourished.  Abdominal:    Neurological: She is alert and oriented to person, place, and time.  Skin: Skin is warm and dry.  The sutures were removed and steri strips applied.  Data Reviewed Feb 23, 2018 pathology: DIAGNOSIS:  A. COLON, SIGMOID; RESECTION:  - DIVERTICULOSIS.  - FIBROUS SEROSAL ADHESIONS.  - BENIGN REACTIVE LYMPH NODE.  - UNREMARKABLE MARGINS OF RESECTION.  - NEGATIVE FOR DYSPLASIA AND MALIGNANCY.   B. APPENDIX; INCIDENTAL APPENDECTOMY:  - FIBROUS  OBLITERATION OF THE DISTAL LUMEN.  - NEGATIVE FOR DYSPLASIA AND MALIGNANCY.  - FOCAL SEROSAL EDEMA AND ACUTE SEROSITIS.   Notable at the time of specimen examination in the operating room was marked scarring with diminution of colonic caliber in the segment most prominently involved with her diverticulosis.  Assessment    Excellent progress post sigmoid colectomy.    Plan  Return in six week. The patient is aware to call back for any questions or concerns.   HPI, Physical Exam, Assessment and Plan have been scribed under the direction and in the presence of Hervey Ard, MD.  [name], CMA  I have completed the exam and reviewed the above documentation for accuracy and completeness.  I agree with the above.  Haematologist has been used and any errors in dictation or transcription are unintentional.  Hervey Ard, M.D., F.A.C.S. [name] 03/15/2018, 8:16 PM

## 2018-03-24 ENCOUNTER — Encounter (HOSPITAL_COMMUNITY): Payer: Self-pay

## 2018-03-24 ENCOUNTER — Ambulatory Visit
Admission: RE | Admit: 2018-03-24 | Discharge: 2018-03-24 | Disposition: A | Discharge status: Home / Self Care | Payer: No Typology Code available for payment source | Source: Ambulatory Visit | Attending: Obstetrics and Gynecology | Admitting: Obstetrics and Gynecology

## 2018-03-24 ENCOUNTER — Ambulatory Visit (HOSPITAL_COMMUNITY)
Admission: RE | Admit: 2018-03-24 | Discharge: 2018-03-24 | Disposition: A | Discharge status: Home / Self Care | Payer: Self-pay | Source: Ambulatory Visit | Attending: Obstetrics and Gynecology | Admitting: Obstetrics and Gynecology

## 2018-03-24 VITALS — BP 139/84 | Ht 61.0 in

## 2018-03-24 DIAGNOSIS — Z1239 Encounter for other screening for malignant neoplasm of breast: Secondary | ICD-10-CM

## 2018-03-24 DIAGNOSIS — Z1231 Encounter for screening mammogram for malignant neoplasm of breast: Secondary | ICD-10-CM

## 2018-03-24 NOTE — Progress Notes (Signed)
No complaints today.   Pap Smear: Pap smear not completed today. Last Pap smear was 04/11/2008 and normal. Per patient has no history of an abnormal Pap smear. Patient has a history of a hysterectomy 05/01/2008 due to fibroids. Patient doesn't need any further Pap smears due to her history of a hysterectomy for benign reasons per BCCCP and ACOG guidelines. Last Pap smear and hysterectomy results are in Epic.  Physical exam: Breasts Breasts symmetrical. No skin abnormalities bilateral breasts. No nipple retraction bilateral breasts. No nipple discharge bilateral breasts. No lymphadenopathy. No lumps palpated bilateral breasts. No complaints of pain or tenderness on exam. Referred patient to the [facility] for a screening mammogram. Appointment scheduled for Thursday, March 24, 2018 at [place].        Pelvic/Bimanual No Pap smear completed today since patient has a history of a hysterectomy for benign reasons. Pap smear not indicated per BCCCP guidelines.   Smoking History: Patient has never smoked.  Patient Navigation: Patient education provided. Access to services provided for patient through [place] program.   Colorectal Cancer Screening: Patient had a colonoscopy completed 10/22/2017. No complaints today.   Breast and Cervical Cancer Risk Assessment: Patient has a family history of two maternal aunts having breast cancer. Patient has no known genetic mutations or history of radiation treatment to the chest before age 60. Patient has no history of cervical dysplasia, immunocompromised, or DES exposure in-utero. Patient has a 5-year risk for breast cancer at 1% and a lifetime risk at 7.5%.

## 2018-03-24 NOTE — Patient Instructions (Addendum)
Explained breast self awareness with [name]. Patient did not need a Pap smear today due to her history of a hysterectomy for benign reasons. Let patient know that she doesn't need any further Pap smears due to her history of a hysterectomy for benign reasons. Referred patient to the [facility] for a screening mammogram. Appointment scheduled for Thursday, March 24, 2018 at [place]. Let patient know the [facility] will follow up with her within the next couple weeks with results of mammogram by letter or phone. [name] verbalized understanding.  Jadira Nierman, Arvil Chaco, RN 9:21 AM

## 2018-04-01 ENCOUNTER — Encounter (HOSPITAL_COMMUNITY): Payer: Self-pay | Admitting: *Deleted

## 2018-04-26 ENCOUNTER — Ambulatory Visit: Payer: No Typology Code available for payment source | Admitting: Primary Care

## 2018-04-26 ENCOUNTER — Ambulatory Visit: Payer: Self-pay | Admitting: General Surgery

## 2018-05-01 ENCOUNTER — Encounter: Payer: Self-pay | Admitting: Primary Care

## 2018-05-01 DIAGNOSIS — I10 Essential (primary) hypertension: Secondary | ICD-10-CM

## 2018-05-02 ENCOUNTER — Ambulatory Visit: Payer: No Typology Code available for payment source | Admitting: Primary Care

## 2018-05-02 MED ORDER — LISINOPRIL 30 MG PO TABS: 30.0000 mg | ORAL_TABLET | Freq: Every day | ORAL | 0 refills | Status: DC

## 2018-05-02 NOTE — Telephone Encounter (Signed)
MyChart refill request for lisinopril (PRINIVIL,ZESTRIL) 30 MG tablet  Medication have not been prescribed Allie Bossier  Last office visit on 11/22/2017

## 2018-05-09 ENCOUNTER — Encounter: Payer: Self-pay | Admitting: Primary Care

## 2018-05-17 ENCOUNTER — Ambulatory Visit (INDEPENDENT_AMBULATORY_CARE_PROVIDER_SITE_OTHER): Payer: Self-pay | Admitting: Primary Care

## 2018-05-17 ENCOUNTER — Encounter: Payer: Self-pay | Admitting: Primary Care

## 2018-05-17 ENCOUNTER — Ambulatory Visit (INDEPENDENT_AMBULATORY_CARE_PROVIDER_SITE_OTHER): Payer: Self-pay | Admitting: General Surgery

## 2018-05-17 ENCOUNTER — Encounter: Payer: Self-pay | Admitting: General Surgery

## 2018-05-17 VITALS — BP 108/70 | HR 70 | Temp 98.0°F | Ht 61.0 in | Wt 255.5 lb

## 2018-05-17 VITALS — BP 114/60 | HR 71 | Resp 14 | Ht 61.0 in | Wt 255.0 lb

## 2018-05-17 DIAGNOSIS — Z23 Encounter for immunization: Secondary | ICD-10-CM

## 2018-05-17 DIAGNOSIS — K5792 Diverticulitis of intestine, part unspecified, without perforation or abscess without bleeding: Secondary | ICD-10-CM

## 2018-05-17 DIAGNOSIS — E162 Hypoglycemia, unspecified: Secondary | ICD-10-CM

## 2018-05-17 NOTE — Progress Notes (Signed)
Patient ID: [name], female   DOB: 08/07/1961, 57 y.o.   MRN: [id number]  Chief Complaint  Patient presents with  . Follow-up    HPI [name] is a 57 y.o. female here today for her follow up colectomy. Patient states she is doing well. Denies any nausea, vomiting, diarrhea or constipation. Bowles move daily, no bleeding. No dietary intolerance. She is here with her husband, Corene Cornea.  HPI  Past Medical History:  Diagnosis Date  . Acute kidney injury ([place]) 05/19/2017  . Acute myofascial strain of lumbosacral region 08/20/2016  . Allergic rhinitis   . Anemia, iron deficiency   . ANEMIA, IRON DEFICIENCY, history of 04/05/2008   Qualifier: Diagnosis of  By: Philbert Riser MD, Manrique    . Anginal pain [facility])    last cardiologist visit 2010 - dx/ed with anxiety instead of cardiac pain  . Arthritis   . Atypical chest pain    Negative cardiolite at [facility] cards 11/07  . BPPV (benign paroxysmal positional vertigo) 01/07/2017  . CHOLECYSTECTOMY, HX OF    Annotation: 2000 Qualifier: Diagnosis of  By: Eddie Dibbles MD, Bhakti    . Chronic left maxillary sinus disease 09/22/2012  . Chronic low back pain 11/08/2015  . Depression   . Diarrhea 05/19/2017  . Diverticulitis   . Elevated BP   . Family history of leukemia 09/22/2012   She states that both her uncle and his son was diagnosed with Leukemia. She would like to know more information about the leukemia.       Marland Kitchen GERD (gastroesophageal reflux disease)    no meds  . Hepatic steatosis 09/22/2012   Incidental finding on abd CT in 2011   . History of blood transfusion 03/2008   Requried 2u prbc; menometrorrhagia, uterine fibroids.  . History of methicillin resistant staphylococcus aureus (MRSA) 2017   PCR+  . HTN (hypertension) 02/09/2014  . Hx of colonic polyp 10/26/2017  . Hypertension   . Hypertriglyceridemia    164 - 2/11  . Hypothyroidism   . Injury of left rotator cuff 12/02/2016  . Insomnia   . INSOMNIA UNSPECIFIED 05/01/2010    Qualifier: Diagnosis of  By: Shon Baton, MD, Janett Billow    . Kidney disease    STAGE 3-SEES DR PATEL AT [facility]  . Metabolic syndrome 23/53/6144  . Nausea 05/19/2017  . Nausea vomiting and diarrhea   . 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 Obesity    250 lbs 8/11  . OBESITY 08/31/2006   Annotation: Wt. 248 lbs. 11/2004 Qualifier: Diagnosis of  By: Eddie Dibbles MD, Bhakti    . Panic disorder 11/05  . PANIC DISORDER 08/31/2006   Diagnosed 11/05, well-controlled with Paxil, patient also takes albuterol when she has a panic attack, maybe once/month   . Pectus excavatum 09/22/2012   Incidental finding on CXR in 2013.    Marland Kitchen Perirectal abscess 11/14/2011  . Prediabetes 09/22/2012  . Preventative health care 02/09/2014  . Proteinuria 07/17/2012  . Urinary incontinence without sensory awareness 12/17/2016  . Venous (peripheral) insufficiency 11/08/2015    Past Surgical History:  Procedure Laterality Date  . ABDOMINAL HYSTERECTOMY  04/2007   total abdominal hysterectomy ( cervix) with bilateral salpingo-oopherectomy  . APPENDECTOMY  02/23/2018   Procedure: APPENDECTOMY;  Surgeon: Robert Bellow, MD;  Location: [facility]S;  Service: General;;  . CHOLECYSTECTOMY  07/30/1999   Diagnosis of  By: Eddie Dibbles MD, Bhakti    . DILATION AND CURETTAGE OF UTERUS  1987  . INCISION AND DRAINAGE ABSCESS  11/02/2012   Procedure: INCISION AND DRAINAGE ABSCESS;  Surgeon: Odis Hollingshead, MD;  Location: [place];  Service: General;  Laterality: N/A;  I&D anorectal abscess  . INCISION AND DRAINAGE PERIRECTAL ABSCESS  11/14/2011   Procedure: IRRIGATION AND DEBRIDEMENT PERIRECTAL ABSCESS;  Surgeon: Zenovia Jarred, MD;  Location: [place];  Service: General;  Laterality: N/A;  . LAPAROSCOPIC SIGMOID COLECTOMY N/A 02/23/2018   Procedure: LAPAROSCOPIC ASSISTED SIGMOID COLECTOMY;  Surgeon: Robert Bellow, MD;  Location: [facility] OR[facility];  Service: General;  Laterality: N/A;  . NERVE, TENDON AND ARTERY REPAIR Left 06/11/2013   Procedure:  EXPLORATION AND REPAIR LEFT FOREARM;  Surgeon: Schuyler Amor, MD;  Location: [place];  Service: Orthopedics;  Laterality: Left;  . SHOULDER ARTHROSCOPY WITH ROTATOR CUFF REPAIR AND SUBACROMIAL DECOMPRESSION Left 03/06/2016   Procedure: LEFT SHOULDER ARTHROSCOPY WITH ROTATOR CUFF REPAIR AND SUBACROMIAL DECOMPRESSION, DISTAL CLAVICLE EXCISION;  Surgeon: Leandrew Koyanagi, MD;  Location: [place];  Service: Orthopedics;  Laterality: Left;    Family History  Problem Relation Age of Onset  . Heart attack Mother        at the age of 13's  . Lung cancer Mother        metestatic, + smoker  . Aneurysm Mother   . Hyperlipidemia Mother   . Hypertension Mother   . Hypertension Father   . Heart attack Maternal Grandmother        at the age of 52's  . Breast cancer Maternal Aunt   . Colon cancer Neg Hx   . Stomach cancer Neg Hx   . Esophageal cancer Neg Hx   . Rectal cancer Neg Hx     Social History Social History   Tobacco Use  . Smoking status: Never Smoker  . Smokeless tobacco: Never Used  Substance Use Topics  . Alcohol use: No    Alcohol/week: 0.0 oz  . Drug use: No    Allergies  Allergen Reactions  . Cephalexin Itching  . Doxycycline Nausea And Vomiting  . Dilaudid [Hydromorphone Hcl] Other (See Comments)    Panic attack  . Sulfur Itching and Rash    Current Outpatient Medications  Medication Sig Dispense Refill  . acetaminophen (TYLENOL) 500 MG tablet Take 1,000 mg by mouth every 8 (eight) hours as needed for mild pain.    Marland Kitchen amLODipine (NORVASC) 10 MG tablet Take 1 tablet by mouth once daily for blood pressure. (Patient taking differently: Take 10 mg by mouth at bedtime. Take 1 tablet by mouth once daily for blood pressure.) 90 tablet 3  . aspirin 325 MG tablet Take 325 mg daily by mouth.    Marland Kitchen buPROPion (WELLBUTRIN XL) 300 MG 24 hr tablet Take 300 mg by mouth at bedtime.     . hydrochlorothiazide (HYDRODIURIL) 25 MG tablet Take 1 tablet (25 mg total) by mouth daily. 90 tablet 3   . levothyroxine (SYNTHROID) 88 MCG tablet Take 1 tablet by mouth every morning on an empty stomach with a full glass of water. (Patient taking differently: Take 88 mcg by mouth at bedtime. Take 1 tablet by mouth every morning on an empty stomach with a full glass of water.) 90 tablet 2  . lisinopril (PRINIVIL,ZESTRIL) 30 MG tablet Take 1 tablet (30 mg total) by mouth daily. 90 tablet 0  . PARoxetine (PAXIL) 20 MG tablet Take 1.5 tablets (30 mg total) by mouth daily. (Patient taking differently: Take 30 mg by mouth at bedtime. ) 135 tablet 2  . traZODone (DESYREL)  100 MG tablet Take 100 mg by mouth at bedtime.     No current facility-administered medications for this visit.     Review of Systems Review of Systems  Constitutional: Negative.   Respiratory: Negative.   Cardiovascular: Negative.   Gastrointestinal: Negative for diarrhea, nausea and vomiting.    Blood pressure 114/60, pulse 71, resp. rate 14, height 5\' 1"  (1.549 m), weight 255 lb (115.7 kg), last menstrual period 06/17/2007, SpO2 97 %.  Physical Exam Physical Exam  Constitutional: She is oriented to person, place, and time. She appears well-developed and well-nourished.  Abdominal: Soft. There is no tenderness.    Neurological: She is alert and oriented to person, place, and time.  Skin: Skin is warm and dry.  Psychiatric: Her behavior is normal.    Data Reviewed A. COLON, SIGMOID; RESECTION:  - DIVERTICULOSIS.  - FIBROUS SEROSAL ADHESIONS.  - BENIGN REACTIVE LYMPH NODE.  - UNREMARKABLE MARGINS OF RESECTION.  - NEGATIVE FOR DYSPLASIA AND MALIGNANCY.   B. APPENDIX; INCIDENTAL APPENDECTOMY:  - FIBROUS OBLITERATION OF THE DISTAL LUMEN.  - NEGATIVE FOR DYSPLASIA AND MALIGNANCY.  - FOCAL SEROSAL EDEMA AND ACUTE SEROSITIS.   Assessment    Doing well post sigmoid resection.    Plan    Follow up as needed.    Screening colonoscopy in 10 years.   HPI, Physical Exam, Assessment and Plan have been scribed  under the direction and in the presence of Robert Bellow, MD. Karie Fetch, RN  Forest Gleason Kymir Coles 05/17/2018, 10:49 PM

## 2018-05-17 NOTE — Patient Instructions (Signed)
Stop by the lab prior to leaving today. I will notify you of your results once received.   Make sure to eat regular, healthy meals throughout the day to prevent drops in blood sugar. Protein, vegetables, whole grains are good options.  Avoid sweet tea, soda as this will cause you to urinate more frequently.   Continue with regular exercise.   It was a pleasure to see you today!

## 2018-05-17 NOTE — Patient Instructions (Signed)
The patient is aware to call back for any questions or concerns.  

## 2018-05-17 NOTE — Progress Notes (Signed)
Subjective:    Patient ID: [name], female    DOB: 05-Oct-1961, 57 y.o.   MRN: [id number]  HPI  [name] is a 57 year old female with a history of prediabetes, obesity, laparoscopic sigmoid colectomy who presents today with a chief complaint of hypoglycemia.  Several days ago she started feeling shaky, jittery, and weak so she ate a few [employer] peanut butter cups. She then checked her glucose 20 minutes later on her husbands meter which was 70. Her symptoms have occurred five times within the last 2 weeks, has only checked her glucose the one time.   She'll feel better after drinking soda and peanut butter with each episode. She denies medication changes, extreme exercise, new supplements. She has noticed polyuria, especially at bedtime, this began 3 weeks ago.   Diet currently consists of:  Breakfast: Sandwich, cereal, fruit, left overs Lunch: Sandwich Dinner: Pasta, chicken, beans, home made pizza, soup, vegetables  Snacks: Infrequent  Desserts: Infrequently  Beverages: Water, sweet tea with splenda, soda   Exercise: Swimming 1-2 hours daily, walking   Review of Systems  Respiratory: Negative for shortness of breath.   Cardiovascular: Negative for chest pain.  Endocrine: Positive for polyuria. Negative for cold intolerance.       Feels shaky, weak, jittery intermittently   Neurological: Negative for dizziness.       Past Medical History:  Diagnosis Date  . Acute kidney injury ([place]) 05/19/2017  . Acute myofascial strain of lumbosacral region 08/20/2016  . Allergic rhinitis   . Anemia, iron deficiency   . ANEMIA, IRON DEFICIENCY, history of 04/05/2008   Qualifier: Diagnosis of  By: Philbert Riser MD, Manrique    . Anginal pain [facility])    last cardiologist visit 2010 - dx/ed with anxiety instead of cardiac pain  . Arthritis   . Atypical chest pain    Negative cardiolite at [facility] cards 11/07  . BPPV (benign paroxysmal positional vertigo) 01/07/2017  . CHOLECYSTECTOMY, HX  OF    Annotation: 2000 Qualifier: Diagnosis of  By: Eddie Dibbles MD, Bhakti    . Chronic left maxillary sinus disease 09/22/2012  . Chronic low back pain 11/08/2015  . Depression   . Diarrhea 05/19/2017  . Diverticulitis   . Elevated BP   . Family history of leukemia 09/22/2012   She states that both her uncle and his son was diagnosed with Leukemia. She would like to know more information about the leukemia.       Marland Kitchen GERD (gastroesophageal reflux disease)    no meds  . Hepatic steatosis 09/22/2012   Incidental finding on abd CT in 2011   . History of blood transfusion 03/2008   Requried 2u prbc; menometrorrhagia, uterine fibroids.  . History of methicillin resistant staphylococcus aureus (MRSA) 2017   PCR+  . HTN (hypertension) 02/09/2014  . Hx of colonic polyp 10/26/2017  . Hypertension   . Hypertriglyceridemia    164 - 2/11  . Hypothyroidism   . Injury of left rotator cuff 12/02/2016  . Insomnia   . INSOMNIA UNSPECIFIED 05/01/2010   Qualifier: Diagnosis of  By: Shon Baton, MD, Janett Billow    . 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 Metabolic syndrome 32/35/5732  . Nausea 05/19/2017  . Nausea vomiting and diarrhea   . 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 Obesity    250 lbs 8/11  . OBESITY 08/31/2006   Annotation: Wt. 248 lbs. 11/2004 Qualifier: Diagnosis of  By: Eddie Dibbles MD,  Bhakti    . Panic disorder 11/05  . PANIC DISORDER 08/31/2006   Diagnosed 11/05, well-controlled with Paxil, patient also takes albuterol when she has a panic attack, maybe once/month   . Pectus excavatum 09/22/2012   Incidental finding on CXR in 2013.    Marland Kitchen Perirectal abscess 11/14/2011  . Prediabetes 09/22/2012  . Preventative health care 02/09/2014  . Proteinuria 07/17/2012  . Urinary incontinence without sensory awareness 12/17/2016  . Venous (peripheral) insufficiency 11/08/2015     Social History   Socioeconomic History  . Marital status: Married    Spouse name: Not on file  . Number of children: Not  on file  . Years of education: Not on file  . Highest education level: Not on file  Occupational History  . Not on file  Social Needs  . Financial resource strain: Not on file  . Food insecurity:    Worry: Not on file    Inability: Not on file  . Transportation needs:    Medical: Not on file    Non-medical: Not on file  Tobacco Use  . Smoking status: Never Smoker  . Smokeless tobacco: Never Used  Substance and Sexual Activity  . Alcohol use: No    Alcohol/week: 0.0 oz  . Drug use: No  . Sexual activity: Yes    Birth control/protection: None  Lifestyle  . Physical activity:    Days per week: Not on file    Minutes per session: Not on file  . Stress: Not on file  Relationships  . Social connections:    Talks on phone: Not on file    Gets together: Not on file    Attends religious service: Not on file    Active member of club or organization: Not on file    Attends meetings of clubs or organizations: Not on file    Relationship status: Not on file  . Intimate partner violence:    Fear of current or ex partner: Not on file    Emotionally abused: Not on file    Physically abused: Not on file    Forced sexual activity: Not on file  Other Topics Concern  . Not on file  Social History Narrative   Currently unemployed. Starting college in Aug for accounting.  Married, has 2 children lives with her youngest son.      Past Surgical History:  Procedure Laterality Date  . ABDOMINAL HYSTERECTOMY  04/2007   total abdominal hysterectomy ( cervix) with bilateral salpingo-oopherectomy  . APPENDECTOMY  02/23/2018   Procedure: APPENDECTOMY;  Surgeon: Robert Bellow, MD;  Location: [facility];  Service: General;;  . CHOLECYSTECTOMY  07/30/1999   Diagnosis of  By: Eddie Dibbles MD, Bhakti    . DILATION AND CURETTAGE OF UTERUS  1987  . INCISION AND DRAINAGE ABSCESS  11/02/2012   Procedure: INCISION AND DRAINAGE ABSCESS;  Surgeon: Odis Hollingshead, MD;  Location: [place];  Service: General;   Laterality: N/A;  I&D anorectal abscess  . INCISION AND DRAINAGE PERIRECTAL ABSCESS  11/14/2011   Procedure: IRRIGATION AND DEBRIDEMENT PERIRECTAL ABSCESS;  Surgeon: Zenovia Jarred, MD;  Location: [place];  Service: General;  Laterality: N/A;  . LAPAROSCOPIC SIGMOID COLECTOMY N/A 02/23/2018   Procedure: LAPAROSCOPIC ASSISTED SIGMOID COLECTOMY;  Surgeon: Robert Bellow, MD;  Location: [facility];  Service: General;  Laterality: N/A;  . NERVE, TENDON AND ARTERY REPAIR Left 06/11/2013   Procedure: EXPLORATION AND REPAIR LEFT FOREARM;  Surgeon: Schuyler Amor, MD;  Location: [facility]  OR;  Service: Orthopedics;  Laterality: Left;  . SHOULDER ARTHROSCOPY WITH ROTATOR CUFF REPAIR AND SUBACROMIAL DECOMPRESSION Left 03/06/2016   Procedure: LEFT SHOULDER ARTHROSCOPY WITH ROTATOR CUFF REPAIR AND SUBACROMIAL DECOMPRESSION, DISTAL CLAVICLE EXCISION;  Surgeon: Leandrew Koyanagi, MD;  Location: [place];  Service: Orthopedics;  Laterality: Left;    Family History  Problem Relation Age of Onset  . Heart attack Mother        at the age of 36's  . Lung cancer Mother        metestatic, + smoker  . Aneurysm Mother   . Hyperlipidemia Mother   . Hypertension Mother   . Hypertension Father   . Heart attack Maternal Grandmother        at the age of 46's  . Breast cancer Maternal Aunt   . Colon cancer Neg Hx   . Stomach cancer Neg Hx   . Esophageal cancer Neg Hx   . Rectal cancer Neg Hx     Allergies  Allergen Reactions  . Cephalexin Itching  . Doxycycline Nausea And Vomiting  . Dilaudid [Hydromorphone Hcl] Other (See Comments)    Panic attack  . Sulfur Itching and Rash    Current Outpatient Medications on File Prior to Visit  Medication Sig Dispense Refill  . acetaminophen (TYLENOL) 500 MG tablet Take 1,000 mg by mouth every 8 (eight) hours as needed for mild pain.    Marland Kitchen amLODipine (NORVASC) 10 MG tablet Take 1 tablet by mouth once daily for blood pressure. (Patient taking differently: Take 10 mg by mouth at  bedtime. Take 1 tablet by mouth once daily for blood pressure.) 90 tablet 3  . aspirin 325 MG tablet Take 325 mg daily by mouth.    Marland Kitchen buPROPion (WELLBUTRIN XL) 300 MG 24 hr tablet Take 300 mg by mouth at bedtime.     . hydrochlorothiazide (HYDRODIURIL) 25 MG tablet Take 1 tablet (25 mg total) by mouth daily. 90 tablet 3  . levothyroxine (SYNTHROID) 88 MCG tablet Take 1 tablet by mouth every morning on an empty stomach with a full glass of water. (Patient taking differently: Take 88 mcg by mouth at bedtime. Take 1 tablet by mouth every morning on an empty stomach with a full glass of water.) 90 tablet 2  . lisinopril (PRINIVIL,ZESTRIL) 30 MG tablet Take 1 tablet (30 mg total) by mouth daily. 90 tablet 0  . PARoxetine (PAXIL) 20 MG tablet Take 1.5 tablets (30 mg total) by mouth daily. (Patient taking differently: Take 30 mg by mouth at bedtime. ) 135 tablet 2  . traZODone (DESYREL) 100 MG tablet Take 100 mg by mouth at bedtime.     No current facility-administered medications on file prior to visit.     BP 108/70   Pulse 70   Temp 98 F (36.7 C) (Oral)   Ht 5\' 1"  (1.549 m)   Wt 255 lb 8 oz (115.9 kg)   LMP 06/17/2007   SpO2 98%   BMI 48.28 kg/m    Objective:   Physical Exam  Constitutional: She appears well-nourished.  Neck: Neck supple.  Cardiovascular: Normal rate and regular rhythm.  Respiratory: Effort normal and breath sounds normal.  Skin: Skin is warm and dry.           Assessment & Plan:  Hypoglycemia:  Glucose of 70, 20 minutes after eating candy, symptoms of jittery/shaky/weak. Unsure of cause, could be fluctuations of glucose given history of prediabetes. Recommended to eat balanced diet with protein, vegetables,  whole grains. Discussed to limit sweet tea and soda as this can cause polyuria.  Check A1C, BMP, TSH today. Await lab results.   [name], NP

## 2018-05-18 LAB — BASIC METABOLIC PANEL
BUN: 25 mg/dL — ABNORMAL HIGH (ref 6–23)
CHLORIDE: 103 meq/L (ref 96–112)
CO2: 28 mEq/L (ref 19–32)
Calcium: 10 mg/dL (ref 8.4–10.5)
Creatinine, Ser: 1.35 mg/dL — ABNORMAL HIGH (ref 0.40–1.20)
GFR: 43.32 mL/min — AB (ref >60.00)
Glucose, Bld: 83 mg/dL (ref 70–99)
POTASSIUM: 4.4 meq/L (ref 3.5–5.1)
Sodium: 138 mEq/L (ref 135–145)

## 2018-05-18 LAB — TSH: TSH: 2.92 u[IU]/mL (ref 0.35–4.50)

## 2018-05-18 LAB — HEMOGLOBIN A1C: HEMOGLOBIN A1C: 5.6 % (ref 4.6–6.5)

## 2018-07-08 ENCOUNTER — Telehealth: Payer: Self-pay

## 2018-07-08 NOTE — Telephone Encounter (Signed)
Noted, this is acceptable.

## 2018-07-08 NOTE — Telephone Encounter (Signed)
Pt scheduled a mychart appt on 07/14/18 about feeling unsteady when walking or standing. I spoke with pt and offered sooner appt but pt said the feeling comes and goes and she was going to be in this area on 07/14/18 and that is why she scheduled the appt on 07/14/18. Advised pt if condition changes or worsens prior to appt and if she needs to be seen sooner to call [place]. Pt voiced understanding. FYI to Gentry Fitz NP.

## 2018-07-14 ENCOUNTER — Ambulatory Visit: Payer: No Typology Code available for payment source | Admitting: Primary Care

## 2018-07-19 ENCOUNTER — Ambulatory Visit (INDEPENDENT_AMBULATORY_CARE_PROVIDER_SITE_OTHER)
Admission: RE | Admit: 2018-07-19 | Discharge: 2018-07-19 | Disposition: A | Discharge status: Home / Self Care | Payer: No Typology Code available for payment source | Source: Ambulatory Visit | Attending: Primary Care | Admitting: Primary Care

## 2018-07-19 ENCOUNTER — Encounter: Payer: Self-pay | Admitting: Primary Care

## 2018-07-19 ENCOUNTER — Ambulatory Visit (INDEPENDENT_AMBULATORY_CARE_PROVIDER_SITE_OTHER): Payer: No Typology Code available for payment source | Admitting: Primary Care

## 2018-07-19 VITALS — BP 114/74 | HR 66 | Temp 97.9°F | Ht 61.0 in | Wt 268.5 lb

## 2018-07-19 DIAGNOSIS — M25569 Pain in unspecified knee: Secondary | ICD-10-CM

## 2018-07-19 DIAGNOSIS — M25562 Pain in left knee: Secondary | ICD-10-CM

## 2018-07-19 DIAGNOSIS — Z23 Encounter for immunization: Secondary | ICD-10-CM

## 2018-07-19 DIAGNOSIS — G8929 Other chronic pain: Secondary | ICD-10-CM | POA: Insufficient documentation

## 2018-07-19 NOTE — Progress Notes (Signed)
Subjective:    Patient ID: [name], female    DOB: Mar 26, 1961, 57 y.o.   MRN: [id number]  HPI  [name] is a 57 year old female with a history of hypertension, venous insufficiency, hypothyroidism, BPPV, CKD obesity who presents today with a chief complaint of feeling off balance.  BP Readings from Last 3 Encounters:  07/19/18 114/74  05/17/18 108/70  05/17/18 114/60   She feels like both of her legs "don't want to work" when she goes to stand up or walk. She feels as though her legs will give out. Her legs have given out before but she's been able to catch herself to prevent a fall. She also experiences pain to the left knee with radiation down to the left foot. She has noticed swelling to the left knee intermittently. These symptoms began around 3-4 months ago. She endorses a history of chronic knee pain since the 1980's after falling down several stairs. She did have a cortisone injection around 30 years ago which provided relief for 10 years.   Review of Systems  Musculoskeletal:       Left knee pain  Skin: Negative for color change.  Neurological: Positive for weakness.       Past Medical History:  Diagnosis Date  . Acute kidney injury ([place]) 05/19/2017  . Acute myofascial strain of lumbosacral region 08/20/2016  . Allergic rhinitis   . Anemia, iron deficiency   . ANEMIA, IRON DEFICIENCY, history of 04/05/2008   Qualifier: Diagnosis of  By: Philbert Riser MD, Manrique    . Anginal pain [facility])    last cardiologist visit 2010 - dx/ed with anxiety instead of cardiac pain  . Arthritis   . Atypical chest pain    Negative cardiolite at [facility] cards 11/07  . BPPV (benign paroxysmal positional vertigo) 01/07/2017  . CHOLECYSTECTOMY, HX OF    Annotation: 2000 Qualifier: Diagnosis of  By: Eddie Dibbles MD, Bhakti    . Chronic left maxillary sinus disease 09/22/2012  . Chronic low back pain 11/08/2015  . Depression   . Diarrhea 05/19/2017  . Diverticulitis   . Elevated BP   . Family  history of leukemia 09/22/2012   She states that both her uncle and his son was diagnosed with Leukemia. She would like to know more information about the leukemia.       Marland Kitchen GERD (gastroesophageal reflux disease)    no meds  . Hepatic steatosis 09/22/2012   Incidental finding on abd CT in 2011   . History of blood transfusion 03/2008   Requried 2u prbc; menometrorrhagia, uterine fibroids.  . History of methicillin resistant staphylococcus aureus (MRSA) 2017   PCR+  . HTN (hypertension) 02/09/2014  . Hx of colonic polyp 10/26/2017  . Hypertension   . Hypertriglyceridemia    164 - 2/11  . Hypothyroidism   . Injury of left rotator cuff 12/02/2016  . Insomnia   . INSOMNIA UNSPECIFIED 05/01/2010   Qualifier: Diagnosis of  By: Shon Baton, MD, Janett Billow    . Kidney disease    STAGE 3-SEES DR PATEL AT [place]  . Metabolic syndrome 22/97/9892  . Nausea 05/19/2017  . Nausea vomiting and diarrhea   . 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 Obesity    250 lbs 8/11  . OBESITY 08/31/2006   Annotation: Wt. 248 lbs. 11/2004 Qualifier: Diagnosis of  By: Eddie Dibbles MD, Bhakti    . Panic disorder 11/05  . PANIC DISORDER 08/31/2006   Diagnosed 11/05, well-controlled with Paxil, patient also  takes albuterol when she has a panic attack, maybe once/month   . Pectus excavatum 09/22/2012   Incidental finding on CXR in 2013.    Marland Kitchen Perirectal abscess 11/14/2011  . Prediabetes 09/22/2012  . Preventative health care 02/09/2014  . Proteinuria 07/17/2012  . Urinary incontinence without sensory awareness 12/17/2016  . Venous (peripheral) insufficiency 11/08/2015     Social History   Socioeconomic History  . Marital status: Married    Spouse name: Not on file  . Number of children: Not on file  . Years of education: Not on file  . Highest education level: Not on file  Occupational History  . Not on file  Social Needs  . Financial resource strain: Not on file  . Food insecurity:    Worry: Not on file    Inability: Not  on file  . Transportation needs:    Medical: Not on file    Non-medical: Not on file  Tobacco Use  . Smoking status: Never Smoker  . Smokeless tobacco: Never Used  Substance and Sexual Activity  . Alcohol use: No    Alcohol/week: 0.0 standard drinks  . Drug use: No  . Sexual activity: Yes    Birth control/protection: None  Lifestyle  . Physical activity:    Days per week: Not on file    Minutes per session: Not on file  . Stress: Not on file  Relationships  . Social connections:    Talks on phone: Not on file    Gets together: Not on file    Attends religious service: Not on file    Active member of club or organization: Not on file    Attends meetings of clubs or organizations: Not on file    Relationship status: Not on file  . Intimate partner violence:    Fear of current or ex partner: Not on file    Emotionally abused: Not on file    Physically abused: Not on file    Forced sexual activity: Not on file  Other Topics Concern  . Not on file  Social History Narrative   Currently unemployed. Starting college in Aug for accounting.  Married, has 2 children lives with her youngest son.      Past Surgical History:  Procedure Laterality Date  . ABDOMINAL HYSTERECTOMY  04/2007   total abdominal hysterectomy ( cervix) with bilateral salpingo-oopherectomy  . APPENDECTOMY  02/23/2018   Procedure: APPENDECTOMY;  Surgeon: Robert Bellow, MD;  Location: [facility];  Service: General;;  . CHOLECYSTECTOMY  07/30/1999   Diagnosis of  By: Eddie Dibbles MD, Bhakti    . DILATION AND CURETTAGE OF UTERUS  1987  . INCISION AND DRAINAGE ABSCESS  11/02/2012   Procedure: INCISION AND DRAINAGE ABSCESS;  Surgeon: Odis Hollingshead, MD;  Location: [place];  Service: General;  Laterality: N/A;  I&D anorectal abscess  . INCISION AND DRAINAGE PERIRECTAL ABSCESS  11/14/2011   Procedure: IRRIGATION AND DEBRIDEMENT PERIRECTAL ABSCESS;  Surgeon: Zenovia Jarred, MD;  Location: [place];  Service: General;   Laterality: N/A;  . LAPAROSCOPIC SIGMOID COLECTOMY N/A 02/23/2018   Procedure: LAPAROSCOPIC ASSISTED SIGMOID COLECTOMY;  Surgeon: Robert Bellow, MD;  Location: [facility];  Service: General;  Laterality: N/A;  . NERVE, TENDON AND ARTERY REPAIR Left 06/11/2013   Procedure: EXPLORATION AND REPAIR LEFT FOREARM;  Surgeon: Schuyler Amor, MD;  Location: [place];  Service: Orthopedics;  Laterality: Left;  . SHOULDER ARTHROSCOPY WITH ROTATOR CUFF REPAIR AND SUBACROMIAL DECOMPRESSION Left 03/06/2016  Procedure: LEFT SHOULDER ARTHROSCOPY WITH ROTATOR CUFF REPAIR AND SUBACROMIAL DECOMPRESSION, DISTAL CLAVICLE EXCISION;  Surgeon: Leandrew Koyanagi, MD;  Location: [place];  Service: Orthopedics;  Laterality: Left;    Family History  Problem Relation Age of Onset  . Heart attack Mother        at the age of 41's  . Lung cancer Mother        metestatic, + smoker  . Aneurysm Mother   . Hyperlipidemia Mother   . Hypertension Mother   . Hypertension Father   . Heart attack Maternal Grandmother        at the age of 16's  . Breast cancer Maternal Aunt   . Colon cancer Neg Hx   . Stomach cancer Neg Hx   . Esophageal cancer Neg Hx   . Rectal cancer Neg Hx     Allergies  Allergen Reactions  . Cephalexin Itching  . Doxycycline Nausea And Vomiting  . Dilaudid [Hydromorphone Hcl] Other (See Comments)    Panic attack  . Sulfur Itching and Rash    Current Outpatient Medications on File Prior to Visit  Medication Sig Dispense Refill  . acetaminophen (TYLENOL) 500 MG tablet Take 1,000 mg by mouth every 8 (eight) hours as needed for mild pain.    Marland Kitchen amLODipine (NORVASC) 10 MG tablet Take 1 tablet by mouth once daily for blood pressure. (Patient taking differently: Take 10 mg by mouth at bedtime. Take 1 tablet by mouth once daily for blood pressure.) 90 tablet 3  . aspirin 325 MG tablet Take 325 mg daily by mouth.    Marland Kitchen buPROPion (WELLBUTRIN XL) 300 MG 24 hr tablet Take 300 mg by mouth at bedtime.     .  hydrochlorothiazide (HYDRODIURIL) 25 MG tablet Take 1 tablet (25 mg total) by mouth daily. 90 tablet 3  . levothyroxine (SYNTHROID) 88 MCG tablet Take 1 tablet by mouth every morning on an empty stomach with a full glass of water. (Patient taking differently: Take 88 mcg by mouth at bedtime. Take 1 tablet by mouth every morning on an empty stomach with a full glass of water.) 90 tablet 2  . lisinopril (PRINIVIL,ZESTRIL) 30 MG tablet Take 1 tablet (30 mg total) by mouth daily. 90 tablet 0  . PARoxetine (PAXIL) 20 MG tablet Take 1.5 tablets (30 mg total) by mouth daily. (Patient taking differently: Take 30 mg by mouth at bedtime. ) 135 tablet 2  . traZODone (DESYREL) 100 MG tablet Take 100 mg by mouth at bedtime.     No current facility-administered medications on file prior to visit.     BP 114/74   Pulse 66   Temp 97.9 F (36.6 C) (Oral)   Ht 5\' 1"  (1.549 m)   Wt 268 lb 8 oz (121.8 kg)   LMP 06/17/2007   SpO2 99%   BMI 50.73 kg/m    Objective:   Physical Exam  Constitutional: She appears well-nourished.  Cardiovascular: Normal rate and regular rhythm.  Respiratory: Effort normal and breath sounds normal.  Musculoskeletal:       Left knee: She exhibits normal range of motion and no swelling. No tenderness found.  5/5 strength to bilateral lower extremities during exam  Neurological:  Reflex Scores:      Patellar reflexes are 2+ on the right side and 2+ on the left side. Skin: Skin is warm and dry.           Assessment & Plan:

## 2018-07-19 NOTE — Assessment & Plan Note (Signed)
Long history of bilateral knee pain since 1980's. Acute on chronic flare to left knee with some instability. Exam today with overall good strength bilaterally.   Suspect arthritis to be the main cause. Check plain films today. Also discussed to wear a knee brace for support. Discussed sports medicine evaluation in 2 weeks if no improvement with conservative measures.

## 2018-07-19 NOTE — Addendum Note (Signed)
Addended by: Jacqualin Combes on: 07/19/2018 12:46 PM   Modules accepted: Orders

## 2018-07-19 NOTE — Patient Instructions (Signed)
Complete xray(s) prior to leaving today. I will notify you of your results once received.  Purchase a knee sleeve/brace to wear to the left knee when walking. This will help to provide support to the knee. You can apply ice to the knee for any swelling.   Schedule an appointment with Dr. Lorelei Pont in 2 weeks if no improvement with the knee brace.  It was a pleasure to see you today!

## 2018-08-04 ENCOUNTER — Ambulatory Visit: Payer: No Typology Code available for payment source | Admitting: Family Medicine

## 2018-08-08 ENCOUNTER — Encounter: Payer: Self-pay | Admitting: Family Medicine

## 2018-08-08 ENCOUNTER — Ambulatory Visit: Payer: Self-pay

## 2018-08-08 ENCOUNTER — Ambulatory Visit (INDEPENDENT_AMBULATORY_CARE_PROVIDER_SITE_OTHER): Payer: No Typology Code available for payment source | Admitting: Family Medicine

## 2018-08-08 VITALS — BP 120/78 | HR 67 | Temp 97.9°F | Ht 61.0 in | Wt 270.4 lb

## 2018-08-08 DIAGNOSIS — R079 Chest pain, unspecified: Secondary | ICD-10-CM

## 2018-08-08 LAB — CBC WITH DIFFERENTIAL/PLATELET
BASOS ABS: 0 10*3/uL (ref 0.0–0.1)
Basophils Relative: 0.6 % (ref 0.0–3.0)
EOS PCT: 2.2 % (ref 0.0–5.0)
Eosinophils Absolute: 0.2 10*3/uL (ref 0.0–0.7)
HEMATOCRIT: 38.2 % (ref 36.0–46.0)
Hemoglobin: 12.8 g/dL (ref 12.0–15.0)
LYMPHS ABS: 1.4 10*3/uL (ref 0.7–4.0)
LYMPHS PCT: 17.1 % (ref 12.0–46.0)
MCHC: 33.4 g/dL (ref 30.0–36.0)
MCV: 89.3 fl (ref 78.0–100.0)
Monocytes Absolute: 0.4 10*3/uL (ref 0.1–1.0)
Monocytes Relative: 5.2 % (ref 3.0–12.0)
NEUTROS PCT: 74.9 % (ref 43.0–77.0)
Neutro Abs: 6 10*3/uL (ref 1.4–7.7)
Platelets: 255 10*3/uL (ref 150.0–400.0)
RBC: 4.28 Mil/uL (ref 3.87–5.11)
RDW: 14.1 % (ref 11.5–15.5)
WBC: 8 10*3/uL (ref 4.0–10.5)

## 2018-08-08 LAB — COMPREHENSIVE METABOLIC PANEL
ALBUMIN: 4 g/dL (ref 3.5–5.2)
ALT: 11 U/L (ref 0–35)
AST: 14 U/L (ref 0–37)
Alkaline Phosphatase: 86 U/L (ref 39–117)
BILIRUBIN TOTAL: 0.4 mg/dL (ref 0.2–1.2)
BUN: 25 mg/dL — AB (ref 6–23)
CO2: 29 mEq/L (ref 19–32)
CREATININE: 1.23 mg/dL — AB (ref 0.40–1.20)
Calcium: 9.5 mg/dL (ref 8.4–10.5)
Chloride: 103 mEq/L (ref 96–112)
GFR: 48.2 mL/min — ABNORMAL LOW (ref >60.00)
Glucose, Bld: 109 mg/dL — ABNORMAL HIGH (ref 70–99)
Potassium: 4.2 mEq/L (ref 3.5–5.1)
SODIUM: 138 meq/L (ref 135–145)
TOTAL PROTEIN: 7.5 g/dL (ref 6.0–8.3)

## 2018-08-08 LAB — TROPONIN I: TNIDX: 0.01 ug/L (ref 0.00–0.06)

## 2018-08-08 MED ORDER — NITROGLYCERIN 0.4 MG SL SUBL: 0.4000 mg | SUBLINGUAL_TABLET | SUBLINGUAL | 0 refills | Status: AC | PRN

## 2018-08-08 NOTE — Patient Instructions (Signed)
Good to see you today  Please stop at the lab and see one of the referrals coordinators  I have sent under the tongue nitroglycerin for you to take if chest pain occurs- then go to ER    Nitroglycerin sublingual tablets What is this medicine? NITROGLYCERIN (nye troe GLI ser in) is a type of vasodilator. It relaxes blood vessels, increasing the blood and oxygen supply to your heart. This medicine is used to relieve chest pain caused by angina. It is also used to prevent chest pain before activities like climbing stairs, going outdoors in cold weather, or sexual activity. This medicine may be used for other purposes; ask your health care provider or pharmacist if you have questions. COMMON BRAND NAME(S): Nitroquick, Nitrostat, Nitrotab What should I tell my health care provider before I take this medicine? They need to know if you have any of these conditions: -anemia -head injury, recent stroke, or bleeding in the brain -liver disease -previous heart attack -an unusual or allergic reaction to nitroglycerin, other medicines, foods, dyes, or preservatives -pregnant or trying to get pregnant -breast-feeding How should I use this medicine? Take this medicine by mouth as needed. At the first sign of an angina attack (chest pain or tightness) place one tablet under your tongue. You can also take this medicine 5 to 10 minutes before an event likely to produce chest pain. Follow the directions on the prescription label. Let the tablet dissolve under the tongue. Do not swallow whole. Replace the dose if you accidentally swallow it. It will help if your mouth is not dry. Saliva around the tablet will help it to dissolve more quickly. Do not eat or drink, smoke or chew tobacco while a tablet is dissolving. If you are not better within 5 minutes after taking ONE dose of nitroglycerin, call 9-1-1 immediately to seek emergency medical care. Do not take more than 3 nitroglycerin tablets over 15 minutes. If  you take this medicine often to relieve symptoms of angina, your doctor or health care professional may provide you with different instructions to manage your symptoms. If symptoms do not go away after following these instructions, it is important to call 9-1-1 immediately. Do not take more than 3 nitroglycerin tablets over 15 minutes. Talk to your pediatrician regarding the use of this medicine in children. Special care may be needed. Overdosage: If you think you have taken too much of this medicine contact a poison control center or emergency room at once. NOTE: This medicine is only for you. Do not share this medicine with others. What if I miss a dose? This does not apply. This medicine is only used as needed. What may interact with this medicine? Do not take this medicine with any of the following medications: -certain migraine medicines like ergotamine and dihydroergotamine (DHE) -medicines used to treat erectile dysfunction like sildenafil, tadalafil, and vardenafil -riociguat This medicine may also interact with the following medications: -alteplase -aspirin -heparin -medicines for high blood pressure -medicines for mental depression -other medicines used to treat angina -phenothiazines like chlorpromazine, mesoridazine, prochlorperazine, thioridazine This list may not describe all possible interactions. Give your health care provider a list of all the medicines, herbs, non-prescription drugs, or dietary supplements you use. Also tell them if you smoke, drink alcohol, or use illegal drugs. Some items may interact with your medicine. What should I watch for while using this medicine? Tell your doctor or health care professional if you feel your medicine is no longer working. Keep this medicine  with you at all times. Sit or lie down when you take your medicine to prevent falling if you feel dizzy or faint after using it. Try to remain calm. This will help you to feel better faster. If you  feel dizzy, take several deep breaths and lie down with your feet propped up, or bend forward with your head resting between your knees. You may get drowsy or dizzy. Do not drive, use machinery, or do anything that needs mental alertness until you know how this drug affects you. Do not stand or sit up quickly, especially if you are an older patient. This reduces the risk of dizzy or fainting spells. Alcohol can make you more drowsy and dizzy. Avoid alcoholic drinks. Do not treat yourself for coughs, colds, or pain while you are taking this medicine without asking your doctor or health care professional for advice. Some ingredients may increase your blood pressure. What side effects may I notice from receiving this medicine? Side effects that you should report to your doctor or health care professional as soon as possible: -blurred vision -dry mouth -skin rash -sweating -the feeling of extreme pressure in the head -unusually weak or tired Side effects that usually do not require medical attention (report to your doctor or health care professional if they continue or are bothersome): -flushing of the face or neck -headache -irregular heartbeat, palpitations -nausea, vomiting This list may not describe all possible side effects. Call your doctor for medical advice about side effects. You may report side effects to FDA at 1-800-FDA-1088. Where should I keep my medicine? Keep out of the reach of children. Store at room temperature between 20 and 25 degrees C (68 and 77 degrees F). Store in [employer]. Protect from light and moisture. Keep tightly closed. Throw away any unused medicine after the expiration date. NOTE: This sheet is a summary. It may not cover all possible information. If you have questions about this medicine, talk to your doctor, pharmacist, or health care provider.  2018 Elsevier/Gold Standard (2013-07-27 17:57:36)

## 2018-08-08 NOTE — Progress Notes (Signed)
Subjective:    Patient ID: [name], female    DOB: [birth year], 57 y.o.   MRN: [id number]  HPI This is a 57 yo female, accompanied by her husband, who presents today with right sided chest and arm pain that occurred twice 2 days ago, the first episode occured when she was walking quickly and going up stairs, the second episode occurred later in the day when she was playing with her grandson. Each episode lasted 3-4 minutes and was relieved with rest. Pain radiated down right arm, she was diaphoretic, mildly nauseous. She has taken it easy over last couple of days and has not had additional chest pain. Both parents with elevated BP. Long term second hand smoke exposure. Per [facility], negative cardiolite 11/07, no other cardiac testing. History of complete hysterectomy with oophorectomy 8 years ago.    Past Medical History:  Diagnosis Date  . Acute kidney injury ([place]) 05/19/2017  . Acute myofascial strain of lumbosacral region 08/20/2016  . Allergic rhinitis   . Anemia, iron deficiency   . ANEMIA, IRON DEFICIENCY, history of 04/05/2008   Qualifier: Diagnosis of  By: Philbert Riser MD, Manrique    . Anginal pain [facility])    last cardiologist visit 2010 - dx/ed with anxiety instead of cardiac pain  . Arthritis   . Atypical chest pain    Negative cardiolite at [facility] 11/07  . BPPV (benign paroxysmal positional vertigo) 01/07/2017  . CHOLECYSTECTOMY, HX OF    Annotation: 2000 Qualifier: Diagnosis of  By: Eddie Dibbles MD, Bhakti    . Chronic left maxillary sinus disease 09/22/2012  . Chronic low back pain 11/08/2015  . Depression   . Diarrhea 05/19/2017  . Diverticulitis   . Elevated BP   . Family history of leukemia 09/22/2012   She states that both her uncle and his son was diagnosed with Leukemia. She would like to know more information about the leukemia.       Marland Kitchen GERD (gastroesophageal reflux disease)    no meds  . Hepatic steatosis 09/22/2012   Incidental finding on abd CT in 2011   . History of  blood transfusion 03/2008   Requried 2u prbc; menometrorrhagia, uterine fibroids.  . History of methicillin resistant staphylococcus aureus (MRSA) 2017   PCR+  . HTN (hypertension) 02/09/2014  . Hx of colonic polyp 10/26/2017  . Hypertension   . Hypertriglyceridemia    164 - 2/11  . Hypothyroidism   . Injury of left rotator cuff 12/02/2016  . Insomnia   . INSOMNIA UNSPECIFIED 05/01/2010   Qualifier: Diagnosis of  By: Shon Baton, MD, Janett Billow    . Kidney disease    STAGE 3-SEES DR PATEL AT [place]  . Metabolic syndrome 50/27/7412  . Nausea 05/19/2017  . Nausea vomiting and diarrhea   . 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 Obesity    250 lbs 8/11  . OBESITY 08/31/2006   Annotation: Wt. 248 lbs. 11/2004 Qualifier: Diagnosis of  By: Eddie Dibbles MD, Bhakti    . Panic disorder 11/05  . PANIC DISORDER 08/31/2006   Diagnosed 11/05, well-controlled with Paxil, patient also takes albuterol when she has a panic attack, maybe once/month   . Pectus excavatum 09/22/2012   Incidental finding on CXR in 2013.    Marland Kitchen Perirectal abscess 11/14/2011  . Prediabetes 09/22/2012  . Preventative health care 02/09/2014  . Proteinuria 07/17/2012  . Urinary incontinence without sensory awareness 12/17/2016  . Venous (peripheral) insufficiency 11/08/2015   Past Surgical History:  Procedure Laterality  Date  . ABDOMINAL HYSTERECTOMY  04/2007   total abdominal hysterectomy ( cervix) with bilateral salpingo-oopherectomy  . APPENDECTOMY  02/23/2018   Procedure: APPENDECTOMY;  Surgeon: Robert Bellow, MD;  Location: [facility];  Service: General;;  . CHOLECYSTECTOMY  07/30/1999   Diagnosis of  By: Eddie Dibbles MD, Bhakti    . DILATION AND CURETTAGE OF UTERUS  1987  . INCISION AND DRAINAGE ABSCESS  11/02/2012   Procedure: INCISION AND DRAINAGE ABSCESS;  Surgeon: Odis Hollingshead, MD;  Location: [place];  Service: General;  Laterality: N/A;  I&D anorectal abscess  . INCISION AND DRAINAGE PERIRECTAL ABSCESS  11/14/2011   Procedure:  IRRIGATION AND DEBRIDEMENT PERIRECTAL ABSCESS;  Surgeon: Zenovia Jarred, MD;  Location: [place];  Service: General;  Laterality: N/A;  . LAPAROSCOPIC SIGMOID COLECTOMY N/A 02/23/2018   Procedure: LAPAROSCOPIC ASSISTED SIGMOID COLECTOMY;  Surgeon: Robert Bellow, MD;  Location: [facility];  Service: General;  Laterality: N/A;  . NERVE, TENDON AND ARTERY REPAIR Left 06/11/2013   Procedure: EXPLORATION AND REPAIR LEFT FOREARM;  Surgeon: Schuyler Amor, MD;  Location: [place];  Service: Orthopedics;  Laterality: Left;  . SHOULDER ARTHROSCOPY WITH ROTATOR CUFF REPAIR AND SUBACROMIAL DECOMPRESSION Left 03/06/2016   Procedure: LEFT SHOULDER ARTHROSCOPY WITH ROTATOR CUFF REPAIR AND SUBACROMIAL DECOMPRESSION, DISTAL CLAVICLE EXCISION;  Surgeon: Leandrew Koyanagi, MD;  Location: [place];  Service: Orthopedics;  Laterality: Left;   Family History  Problem Relation Age of Onset  . Heart attack Mother        at the age of 42's  . Lung cancer Mother        metestatic, + smoker  . Aneurysm Mother   . Hyperlipidemia Mother   . Hypertension Mother   . Hypertension Father   . Heart attack Maternal Grandmother        at the age of 12's  . Breast cancer Maternal Aunt   . Colon cancer Neg Hx   . Stomach cancer Neg Hx   . Esophageal cancer Neg Hx   . Rectal cancer Neg Hx    Social History   Tobacco Use  . Smoking status: Never Smoker  . Smokeless tobacco: Never Used  Substance Use Topics  . Alcohol use: No    Alcohol/week: 0.0 standard drinks  . Drug use: No      Review of Systems Per HPI    Objective:   Physical Exam  Constitutional: She is oriented to person, place, and time. She appears well-developed and well-nourished.  Non-toxic appearance. She does not appear ill.  Morbidly obese.   HENT:  Head: Normocephalic and atraumatic.  Neck: Normal range of motion. Neck supple.  Cardiovascular: Normal rate, regular rhythm and normal heart sounds.  Pulmonary/Chest: Effort normal and breath sounds  normal.  Musculoskeletal: She exhibits edema (trace LE).  Neurological: She is alert and oriented to person, place, and time.  Skin: Skin is warm and dry.  Psychiatric: She has a normal mood and affect. Her behavior is normal. Judgment and thought content normal.  Vitals reviewed.     BP 120/78 (BP Location: Right Arm, Patient Position: Sitting, Cuff Size: Large)   Pulse 67   Temp 97.9 F (36.6 C) (Oral)   Ht 5\' 1"  (1.549 m)   Wt 270 lb 6.4 oz (122.7 kg)   LMP 06/17/2007   SpO2 97%   BMI 51.09 kg/m  Wt Readings from Last 3 Encounters:  08/08/18 270 lb 6.4 oz (122.7 kg)  07/19/18 268 lb 8  oz (121.8 kg)  05/17/18 255 lb 8 oz (115.9 kg)   BP Readings from Last 3 Encounters:  08/08/18 120/78  07/19/18 114/74  05/17/18 108/70   EKG- NSR, rate 63, compared to EKG 5/19- unchanged    Assessment & Plan:  Discussed with Dr. Damita Dunnings who also reviewed EKG 1. Right-sided chest pain - concern for angina in this pleasant female with multiple risk factors - Appointment made with cardiology for tomorrow, patient notified - strict ER precautions reviewed - EKG 12-Lead - nitroGLYCERIN (NITROSTAT) 0.4 MG SL tablet; Place 1 tablet (0.4 mg total) under the tongue every 5 (five) minutes as needed for chest pain.  Dispense: 50 tablet; Refill: 0 - CBC with Differential - Comprehensive metabolic panel - Troponin I  2. Exertional chest pain - EKG 12-Lead - nitroGLYCERIN (NITROSTAT) 0.4 MG SL tablet; Place 1 tablet (0.4 mg total) under the tongue every 5 (five) minutes as needed for chest pain.  Dispense: 50 tablet; Refill: 0 - CBC with Differential - Comprehensive metabolic panel - Troponin I - Ambulatory referral to Cardiology   Clarene Reamer, FNP-BC  [facility]  08/08/2018 7:44 PM

## 2018-08-08 NOTE — Telephone Encounter (Signed)
Pt c/o right sided chest pain that radiated down right arm. Pt stated she began sweating and was short of breath. Pt stated that she the symptoms  lasted 1 minute total. Pt stated right chest and right arm pain was on Saturday and Saturday night. Pt stated the pain subsided after resting.Pt stated she has been asymptomatic since Saturday. At time of call, pt denied dizziness, nausea, vomiting, sweating, fever, difficulty breathing or cough.  Pt given care advice and pt verbalized understanding. Appt given for 9:15 with Clarene Reamer FNP.   Reason for Disposition . [1] Chest pain lasting <= 5 minutes AND [2] NO chest pain or cardiac symptoms now(Exceptions: pains lasting a few seconds)  Answer Assessment - Initial Assessment Questions 1. LOCATION: "Where does it hurt?"       Right side of chest to the right  2. RADIATION: "Does the pain go anywhere else?" (e.g., into neck, jaw, arms, back)     Went to right arm  3. ONSET: "When did the chest pain begin?" (Minutes, hours or days)      Saturday and Saturday  4. PATTERN "Does the pain come and go, or has it been constant since it started?"  "Does it get worse with exertion?"      Pt is without Pt stated thst pain last ted a minutes and went away after sitting and resting 5. DURATION: "How long does it last" (e.g., seconds, minutes, hours)     1 minute each time 6. SEVERITY: "How bad is the pain?"  (e.g., Scale 1-10; mild, moderate, or severe)    - MILD (1-3): doesn't interfere with normal activities     - MODERATE (4-7): interferes with normal activities or awakens from sleep    - SEVERE (8-10): excruciating pain, unable to do any normal activities       No pain at time of call 7. CARDIAC RISK FACTORS: "Do you have any history of heart problems or risk factors for heart disease?" (e.g., prior heart attack, angina; high blood pressure, diabetes, being overweight, high cholesterol, smoking, or strong family history of heart disease)     High  blood pressure overweight, Mom and Maternal Grandmother 8. PULMONARY RISK FACTORS: "Do you have any history of lung disease?"  (e.g., blood clots in lung, asthma, emphysema, birth control pills)     no 9. CAUSE: "What do you think is causing the chest pain?"     Overexertion  10. OTHER SYMPTOMS: "Do you have any other symptoms?" (e.g., dizziness, nausea, vomiting, sweating, fever, difficulty breathing, cough)       Was sweating on Saturday, stated it was hard to breathe but subsided after resting 11. PREGNANCY: "Is there any chance you are pregnant?" "When was your last menstrual period?"       n/a  Protocols used: CHEST PAIN-A-AH

## 2018-08-09 ENCOUNTER — Ambulatory Visit (INDEPENDENT_AMBULATORY_CARE_PROVIDER_SITE_OTHER): Payer: Self-pay | Admitting: Cardiovascular Disease

## 2018-08-09 ENCOUNTER — Encounter: Payer: Self-pay | Admitting: Cardiovascular Disease

## 2018-08-09 VITALS — BP 144/80 | HR 75 | Ht 61.0 in | Wt 267.8 lb

## 2018-08-09 DIAGNOSIS — I200 Unstable angina: Secondary | ICD-10-CM

## 2018-08-09 DIAGNOSIS — R079 Chest pain, unspecified: Secondary | ICD-10-CM

## 2018-08-09 DIAGNOSIS — R0602 Shortness of breath: Secondary | ICD-10-CM

## 2018-08-09 DIAGNOSIS — I10 Essential (primary) hypertension: Secondary | ICD-10-CM

## 2018-08-09 DIAGNOSIS — E785 Hyperlipidemia, unspecified: Secondary | ICD-10-CM

## 2018-08-09 MED ORDER — ASPIRIN 81 MG PO TABS: 81.0000 mg | ORAL_TABLET | Freq: Every day | ORAL | Status: DC

## 2018-08-09 NOTE — H&P (View-Only) (Signed)
Cardiology Office Note   Date:  08/09/2018   ID:  [name], DOB [birth year], MRN [id number]  PCP:  [name], NP  Cardiologist:   Kathlyn Sacramento, MD   Chief Complaint  Patient presents with  . New Patient (Initial Visit)    CP and SOB with exertion, resolves with rest.Medications reviewed verbally.       History of Present Illness: [name] is a 57 y.o. female who was referred by Alma Friendly for evaluation of chest pain.  The patient has no prior cardiac history with no recent cardiac testing.  She had remote stress testing that was negative.  She has multiple risk factors for coronary artery disease including hypertension, stage III chronic kidney disease, mild hyperlipidemia and morbid obesity. She recently had an episode of chest pain while she was looking at mobile homes.  She went few steps and started having substernal chest pain radiating to her right arm.  It was associated with shortness of breath, nausea and sweating.  The symptoms subsided after 5 minutes of rest.  She went back home and was playing with her grandson and had a similar episode.  Next day she went to [place] to get her medications and while walking.  She had a very similar episode that lasted about 10 minutes.  She never had any similar episodes in the past. She is not a smoker and does not drink alcohol. Family history is remarkable for coronary artery disease. She had labs done yesterday which showed negative troponin.  CBC and basic metabolic profile were unremarkable.  She does have stage III chronic kidney disease.   Past Medical History:  Diagnosis Date  . Acute kidney injury ([place]) 05/19/2017  . Acute myofascial strain of lumbosacral region 08/20/2016  . Allergic rhinitis   . Anemia, iron deficiency   . ANEMIA, IRON DEFICIENCY, history of 04/05/2008   Qualifier: Diagnosis of  By: Philbert Riser MD, Manrique    . Anginal pain [facility])    last cardiologist visit 2010 - dx/ed  with anxiety instead of cardiac pain  . Arthritis   . Atypical chest pain    Negative cardiolite at [facility] cards 11/07  . BPPV (benign paroxysmal positional vertigo) 01/07/2017  . CHOLECYSTECTOMY, HX OF    Annotation: 2000 Qualifier: Diagnosis of  By: Eddie Dibbles MD, Bhakti    . Chronic left maxillary sinus disease 09/22/2012  . Chronic low back pain 11/08/2015  . Depression   . Diarrhea 05/19/2017  . Diverticulitis   . Elevated BP   . Family history of leukemia 09/22/2012   She states that both her uncle and his son was diagnosed with Leukemia. She would like to know more information about the leukemia.       Marland Kitchen GERD (gastroesophageal reflux disease)    no meds  . Hepatic steatosis 09/22/2012   Incidental finding on abd CT in 2011   . History of blood transfusion 03/2008   Requried 2u prbc; menometrorrhagia, uterine fibroids.  . History of methicillin resistant staphylococcus aureus (MRSA) 2017   PCR+  . HTN (hypertension) 02/09/2014  . Hx of colonic polyp 10/26/2017  . Hypertension   . Hypertriglyceridemia    164 - 2/11  . Hypothyroidism   . Injury of left rotator cuff 12/02/2016  . Insomnia   . INSOMNIA UNSPECIFIED 05/01/2010   Qualifier: Diagnosis of  By: Shon Baton, MD, Janett Billow    . Kidney disease    STAGE 3-SEES DR PATEL AT [place]  .  Metabolic syndrome 24/23/5361  . Nausea 05/19/2017  . Nausea vomiting and diarrhea   . 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 Obesity    250 lbs 8/11  . OBESITY 08/31/2006   Annotation: Wt. 248 lbs. 11/2004 Qualifier: Diagnosis of  By: Eddie Dibbles MD, Bhakti    . Panic disorder 11/05  . PANIC DISORDER 08/31/2006   Diagnosed 11/05, well-controlled with Paxil, patient also takes albuterol when she has a panic attack, maybe once/month   . Pectus excavatum 09/22/2012   Incidental finding on CXR in 2013.    Marland Kitchen Perirectal abscess 11/14/2011  . Prediabetes 09/22/2012  . Preventative health care 02/09/2014  . Proteinuria 07/17/2012  . Urinary incontinence without  sensory awareness 12/17/2016  . Venous (peripheral) insufficiency 11/08/2015    Past Surgical History:  Procedure Laterality Date  . ABDOMINAL HYSTERECTOMY  04/2007   total abdominal hysterectomy ( cervix) with bilateral salpingo-oopherectomy  . APPENDECTOMY  02/23/2018   Procedure: APPENDECTOMY;  Surgeon: Robert Bellow, MD;  Location: [facility];  Service: General;;  . CHOLECYSTECTOMY  07/30/1999   Diagnosis of  By: Eddie Dibbles MD, Bhakti    . DILATION AND CURETTAGE OF UTERUS  1987  . INCISION AND DRAINAGE ABSCESS  11/02/2012   Procedure: INCISION AND DRAINAGE ABSCESS;  Surgeon: Odis Hollingshead, MD;  Location: [place];  Service: General;  Laterality: N/A;  I&D anorectal abscess  . INCISION AND DRAINAGE PERIRECTAL ABSCESS  11/14/2011   Procedure: IRRIGATION AND DEBRIDEMENT PERIRECTAL ABSCESS;  Surgeon: Zenovia Jarred, MD;  Location: [place];  Service: General;  Laterality: N/A;  . LAPAROSCOPIC SIGMOID COLECTOMY N/A 02/23/2018   Procedure: LAPAROSCOPIC ASSISTED SIGMOID COLECTOMY;  Surgeon: Robert Bellow, MD;  Location: [facility];  Service: General;  Laterality: N/A;  . NERVE, TENDON AND ARTERY REPAIR Left 06/11/2013   Procedure: EXPLORATION AND REPAIR LEFT FOREARM;  Surgeon: Schuyler Amor, MD;  Location: [place];  Service: Orthopedics;  Laterality: Left;  . SHOULDER ARTHROSCOPY WITH ROTATOR CUFF REPAIR AND SUBACROMIAL DECOMPRESSION Left 03/06/2016   Procedure: LEFT SHOULDER ARTHROSCOPY WITH ROTATOR CUFF REPAIR AND SUBACROMIAL DECOMPRESSION, DISTAL CLAVICLE EXCISION;  Surgeon: Leandrew Koyanagi, MD;  Location: [place];  Service: Orthopedics;  Laterality: Left;     Current Outpatient Medications  Medication Sig Dispense Refill  . acetaminophen (TYLENOL) 500 MG tablet Take 1,000 mg by mouth every 8 (eight) hours as needed for mild pain.    Marland Kitchen amLODipine (NORVASC) 10 MG tablet Take 1 tablet by mouth once daily for blood pressure. (Patient taking differently: Take 10 mg by mouth at bedtime. Take 1 tablet by  mouth once daily for blood pressure.) 90 tablet 3  . aspirin 325 MG tablet Take 325 mg daily by mouth.    Marland Kitchen buPROPion (WELLBUTRIN XL) 300 MG 24 hr tablet Take 300 mg by mouth at bedtime.     . hydrochlorothiazide (HYDRODIURIL) 25 MG tablet Take 1 tablet (25 mg total) by mouth daily. 90 tablet 3  . lisinopril (PRINIVIL,ZESTRIL) 30 MG tablet Take 1 tablet (30 mg total) by mouth daily. 90 tablet 0  . nitroGLYCERIN (NITROSTAT) 0.4 MG SL tablet Place 1 tablet (0.4 mg total) under the tongue every 5 (five) minutes as needed for chest pain. 50 tablet 0  . PARoxetine (PAXIL) 20 MG tablet Take 1.5 tablets (30 mg total) by mouth daily. (Patient taking differently: Take 30 mg by mouth at bedtime. ) 135 tablet 2  . traZODone (DESYREL) 100 MG tablet Take 100 mg by mouth at bedtime.    Marland Kitchen  levothyroxine (SYNTHROID) 88 MCG tablet Take 1 tablet by mouth every morning on an empty stomach with a full glass of water. (Patient taking differently: Take 88 mcg by mouth at bedtime. Take 1 tablet by mouth every morning on an empty stomach with a full glass of water.) 90 tablet 2   No current facility-administered medications for this visit.     Allergies:   Cephalexin; Doxycycline; Dilaudid [hydromorphone hcl]; and Sulfur    Social History:  The patient  reports that she has never smoked. She has never used smokeless tobacco. She reports that she does not drink alcohol or use drugs.   Family History:  The patient's family history includes Aneurysm in her mother; Breast cancer in her maternal aunt; Heart attack in her maternal grandmother and mother; Hyperlipidemia in her mother; Hypertension in her father and mother; Lung cancer in her mother.    ROS:  Please see the history of present illness.   Otherwise, review of systems are positive for none.   All other systems are reviewed and negative.    PHYSICAL EXAM: VS:  BP (!) 144/80 (BP Location: Right Arm, Patient Position: Sitting, Cuff Size: Large)   Pulse 75   Ht  5\' 1"  (1.549 m)   Wt 267 lb 12 oz (121.5 kg)   LMP 06/17/2007   BMI 50.59 kg/m  , BMI Body mass index is 50.59 kg/m. GEN: Well nourished, well developed, in no acute distress  HEENT: normal  Neck: no JVD, carotid bruits, or masses Cardiac: RRR; no murmurs, rubs, or gallops,no edema  Respiratory:  clear to auscultation bilaterally, normal work of breathing GI: soft, nontender, nondistended, + BS MS: no deformity or atrophy  Skin: warm and dry, no rash Neuro:  Strength and sensation are intact Psych: euthymic mood, full affect Radial pulses normal bilaterally.  EKG:  EKG is ordered today. The ekg ordered today demonstrates normal sinus rhythm with low voltage and poor R wave progression in the anterior leads   Recent Labs: 05/17/2018: TSH 2.92 08/08/2018: ALT 11; BUN 25; Creatinine, Ser 1.23; Hemoglobin 12.8; Platelets 255.0; Potassium 4.2; Sodium 138    Lipid Panel    Component Value Date/Time   CHOL 176 12/07/2017 0957   CHOL 208 (H) 03/12/2017 0947   TRIG 176.0 (H) 12/07/2017 0957   HDL 35.50 (L) 12/07/2017 0957   HDL 34 (L) 03/12/2017 0947   CHOLHDL 5 12/07/2017 0957   VLDL 35.2 12/07/2017 0957   LDLCALC 106 (H) 12/07/2017 0957   LDLCALC 119 (H) 03/12/2017 0947      Wt Readings from Last 3 Encounters:  08/09/18 267 lb 12 oz (121.5 kg)  08/08/18 270 lb 6.4 oz (122.7 kg)  07/19/18 268 lb 8 oz (121.8 kg)       PAD Screen 08/09/2018  Previous PAD dx? No  Previous surgical procedure? No  Pain with walking? Yes  Subsides with rest? Yes  Feet/toe relief with dangling? Yes  Painful, non-healing ulcers? No  Extremities discolored? No      ASSESSMENT AND PLAN:  1.  Chest pain and shortness of breath: Symptoms are worrisome for unstable angina.  I discussed with her different management options.  Discussed ratification with stress testing is limited by her morbid obesity and would be associated with high rate of false positive and false negative testing. I think  her best option is to proceed directly with left heart catheterization and possible PCI.  I discussed this procedure in details as well as risks and benefits.  I also explained the risk of contrast-induced nephropathy.  Patient understands and wants to proceed.  We will try to minimize the diet.  I am going to obtain an echocardiogram before cardiac catheterization. I asked her to start taking aspirin 81 mg once daily.  2.  Essential hypertension: Blood pressure is reasonably controlled on current medications.  3.  Mild hyperlipidemia: Her LDL was 106.  If cardiac catheterization confirms significant coronary artery disease, she will need treatment with a statin to achieve an LDL below 70.    Disposition:   FU with me in 1 month  Signed,  Kathlyn Sacramento, MD  08/09/2018 4:41 PM    [facility]

## 2018-08-09 NOTE — Progress Notes (Signed)
Cardiology Office Note   Date:  08/09/2018   ID:  [name], DOB 1961-08-31, MRN [id number]  PCP:  [name], NP  Cardiologist:   Kathlyn Sacramento, MD   Chief Complaint  Patient presents with  . New Patient (Initial Visit)    CP and SOB with exertion, resolves with rest.Medications reviewed verbally.       History of Present Illness: [name] is a 57 y.o. female who was referred by Alma Friendly for evaluation of chest pain.  The patient has no prior cardiac history with no recent cardiac testing.  She had remote stress testing that was negative.  She has multiple risk factors for coronary artery disease including hypertension, stage III chronic kidney disease, mild hyperlipidemia and morbid obesity. She recently had an episode of chest pain while she was looking at mobile homes.  She went few steps and started having substernal chest pain radiating to her right arm.  It was associated with shortness of breath, nausea and sweating.  The symptoms subsided after 5 minutes of rest.  She went back home and was playing with her grandson and had a similar episode.  Next day she went to [place] to get her medications and while walking.  She had a very similar episode that lasted about 10 minutes.  She never had any similar episodes in the past. She is not a smoker and does not drink alcohol. Family history is remarkable for coronary artery disease. She had labs done yesterday which showed negative troponin.  CBC and basic metabolic profile were unremarkable.  She does have stage III chronic kidney disease.   Past Medical History:  Diagnosis Date  . Acute kidney injury ([place]) 05/19/2017  . Acute myofascial strain of lumbosacral region 08/20/2016  . Allergic rhinitis   . Anemia, iron deficiency   . ANEMIA, IRON DEFICIENCY, history of 04/05/2008   Qualifier: Diagnosis of  By: Philbert Riser MD, Manrique    . Anginal pain [facility])    last cardiologist visit 2010 - dx/ed  with anxiety instead of cardiac pain  . Arthritis   . Atypical chest pain    Negative cardiolite at [facility] cards 11/07  . BPPV (benign paroxysmal positional vertigo) 01/07/2017  . CHOLECYSTECTOMY, HX OF    Annotation: 2000 Qualifier: Diagnosis of  By: Eddie Dibbles MD, Bhakti    . Chronic left maxillary sinus disease 09/22/2012  . Chronic low back pain 11/08/2015  . Depression   . Diarrhea 05/19/2017  . Diverticulitis   . Elevated BP   . Family history of leukemia 09/22/2012   She states that both her uncle and his son was diagnosed with Leukemia. She would like to know more information about the leukemia.       Marland Kitchen GERD (gastroesophageal reflux disease)    no meds  . Hepatic steatosis 09/22/2012   Incidental finding on abd CT in 2011   . History of blood transfusion 03/2008   Requried 2u prbc; menometrorrhagia, uterine fibroids.  . History of methicillin resistant staphylococcus aureus (MRSA) 2017   PCR+  . HTN (hypertension) 02/09/2014  . Hx of colonic polyp 10/26/2017  . Hypertension   . Hypertriglyceridemia    164 - 2/11  . Hypothyroidism   . Injury of left rotator cuff 12/02/2016  . Insomnia   . INSOMNIA UNSPECIFIED 05/01/2010   Qualifier: Diagnosis of  By: Shon Baton, MD, Janett Billow    . Kidney disease    STAGE 3-SEES DR PATEL AT [place]  .  Metabolic syndrome 82/42/3536  . Nausea 05/19/2017  . Nausea vomiting and diarrhea   . 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 Obesity    250 lbs 8/11  . OBESITY 08/31/2006   Annotation: Wt. 248 lbs. 11/2004 Qualifier: Diagnosis of  By: Eddie Dibbles MD, Bhakti    . Panic disorder 11/05  . PANIC DISORDER 08/31/2006   Diagnosed 11/05, well-controlled with Paxil, patient also takes albuterol when she has a panic attack, maybe once/month   . Pectus excavatum 09/22/2012   Incidental finding on CXR in 2013.    Marland Kitchen Perirectal abscess 11/14/2011  . Prediabetes 09/22/2012  . Preventative health care 02/09/2014  . Proteinuria 07/17/2012  . Urinary incontinence without  sensory awareness 12/17/2016  . Venous (peripheral) insufficiency 11/08/2015    Past Surgical History:  Procedure Laterality Date  . ABDOMINAL HYSTERECTOMY  04/2007   total abdominal hysterectomy ( cervix) with bilateral salpingo-oopherectomy  . APPENDECTOMY  02/23/2018   Procedure: APPENDECTOMY;  Surgeon: Robert Bellow, MD;  Location: [facility];  Service: General;;  . CHOLECYSTECTOMY  07/30/1999   Diagnosis of  By: Eddie Dibbles MD, Bhakti    . DILATION AND CURETTAGE OF UTERUS  1987  . INCISION AND DRAINAGE ABSCESS  11/02/2012   Procedure: INCISION AND DRAINAGE ABSCESS;  Surgeon: Odis Hollingshead, MD;  Location: [place];  Service: General;  Laterality: N/A;  I&D anorectal abscess  . INCISION AND DRAINAGE PERIRECTAL ABSCESS  11/14/2011   Procedure: IRRIGATION AND DEBRIDEMENT PERIRECTAL ABSCESS;  Surgeon: Zenovia Jarred, MD;  Location: [place];  Service: General;  Laterality: N/A;  . LAPAROSCOPIC SIGMOID COLECTOMY N/A 02/23/2018   Procedure: LAPAROSCOPIC ASSISTED SIGMOID COLECTOMY;  Surgeon: Robert Bellow, MD;  Location: [facility];  Service: General;  Laterality: N/A;  . NERVE, TENDON AND ARTERY REPAIR Left 06/11/2013   Procedure: EXPLORATION AND REPAIR LEFT FOREARM;  Surgeon: Schuyler Amor, MD;  Location: [place];  Service: Orthopedics;  Laterality: Left;  . SHOULDER ARTHROSCOPY WITH ROTATOR CUFF REPAIR AND SUBACROMIAL DECOMPRESSION Left 03/06/2016   Procedure: LEFT SHOULDER ARTHROSCOPY WITH ROTATOR CUFF REPAIR AND SUBACROMIAL DECOMPRESSION, DISTAL CLAVICLE EXCISION;  Surgeon: Leandrew Koyanagi, MD;  Location: [place];  Service: Orthopedics;  Laterality: Left;     Current Outpatient Medications  Medication Sig Dispense Refill  . acetaminophen (TYLENOL) 500 MG tablet Take 1,000 mg by mouth every 8 (eight) hours as needed for mild pain.    Marland Kitchen amLODipine (NORVASC) 10 MG tablet Take 1 tablet by mouth once daily for blood pressure. (Patient taking differently: Take 10 mg by mouth at bedtime. Take 1 tablet by  mouth once daily for blood pressure.) 90 tablet 3  . aspirin 325 MG tablet Take 325 mg daily by mouth.    Marland Kitchen buPROPion (WELLBUTRIN XL) 300 MG 24 hr tablet Take 300 mg by mouth at bedtime.     . hydrochlorothiazide (HYDRODIURIL) 25 MG tablet Take 1 tablet (25 mg total) by mouth daily. 90 tablet 3  . lisinopril (PRINIVIL,ZESTRIL) 30 MG tablet Take 1 tablet (30 mg total) by mouth daily. 90 tablet 0  . nitroGLYCERIN (NITROSTAT) 0.4 MG SL tablet Place 1 tablet (0.4 mg total) under the tongue every 5 (five) minutes as needed for chest pain. 50 tablet 0  . PARoxetine (PAXIL) 20 MG tablet Take 1.5 tablets (30 mg total) by mouth daily. (Patient taking differently: Take 30 mg by mouth at bedtime. ) 135 tablet 2  . traZODone (DESYREL) 100 MG tablet Take 100 mg by mouth at bedtime.    Marland Kitchen  levothyroxine (SYNTHROID) 88 MCG tablet Take 1 tablet by mouth every morning on an empty stomach with a full glass of water. (Patient taking differently: Take 88 mcg by mouth at bedtime. Take 1 tablet by mouth every morning on an empty stomach with a full glass of water.) 90 tablet 2   No current facility-administered medications for this visit.     Allergies:   Cephalexin; Doxycycline; Dilaudid [hydromorphone hcl]; and Sulfur    Social History:  The patient  reports that she has never smoked. She has never used smokeless tobacco. She reports that she does not drink alcohol or use drugs.   Family History:  The patient's family history includes Aneurysm in her mother; Breast cancer in her maternal aunt; Heart attack in her maternal grandmother and mother; Hyperlipidemia in her mother; Hypertension in her father and mother; Lung cancer in her mother.    ROS:  Please see the history of present illness.   Otherwise, review of systems are positive for none.   All other systems are reviewed and negative.    PHYSICAL EXAM: VS:  BP (!) 144/80 (BP Location: Right Arm, Patient Position: Sitting, Cuff Size: Large)   Pulse 75   Ht  5\' 1"  (1.549 m)   Wt 267 lb 12 oz (121.5 kg)   LMP 06/17/2007   BMI 50.59 kg/m  , BMI Body mass index is 50.59 kg/m. GEN: Well nourished, well developed, in no acute distress  HEENT: normal  Neck: no JVD, carotid bruits, or masses Cardiac: RRR; no murmurs, rubs, or gallops,no edema  Respiratory:  clear to auscultation bilaterally, normal work of breathing GI: soft, nontender, nondistended, + BS MS: no deformity or atrophy  Skin: warm and dry, no rash Neuro:  Strength and sensation are intact Psych: euthymic mood, full affect Radial pulses normal bilaterally.  EKG:  EKG is ordered today. The ekg ordered today demonstrates normal sinus rhythm with low voltage and poor R wave progression in the anterior leads   Recent Labs: 05/17/2018: TSH 2.92 08/08/2018: ALT 11; BUN 25; Creatinine, Ser 1.23; Hemoglobin 12.8; Platelets 255.0; Potassium 4.2; Sodium 138    Lipid Panel    Component Value Date/Time   CHOL 176 12/07/2017 0957   CHOL 208 (H) 03/12/2017 0947   TRIG 176.0 (H) 12/07/2017 0957   HDL 35.50 (L) 12/07/2017 0957   HDL 34 (L) 03/12/2017 0947   CHOLHDL 5 12/07/2017 0957   VLDL 35.2 12/07/2017 0957   LDLCALC 106 (H) 12/07/2017 0957   LDLCALC 119 (H) 03/12/2017 0947      Wt Readings from Last 3 Encounters:  08/09/18 267 lb 12 oz (121.5 kg)  08/08/18 270 lb 6.4 oz (122.7 kg)  07/19/18 268 lb 8 oz (121.8 kg)       PAD Screen 08/09/2018  Previous PAD dx? No  Previous surgical procedure? No  Pain with walking? Yes  Subsides with rest? Yes  Feet/toe relief with dangling? Yes  Painful, non-healing ulcers? No  Extremities discolored? No      ASSESSMENT AND PLAN:  1.  Chest pain and shortness of breath: Symptoms are worrisome for unstable angina.  I discussed with her different management options.  Discussed ratification with stress testing is limited by her morbid obesity and would be associated with high rate of false positive and false negative testing. I think  her best option is to proceed directly with left heart catheterization and possible PCI.  I discussed this procedure in details as well as risks and benefits.  I also explained the risk of contrast-induced nephropathy.  Patient understands and wants to proceed.  We will try to minimize the diet.  I am going to obtain an echocardiogram before cardiac catheterization. I asked her to start taking aspirin 81 mg once daily.  2.  Essential hypertension: Blood pressure is reasonably controlled on current medications.  3.  Mild hyperlipidemia: Her LDL was 106.  If cardiac catheterization confirms significant coronary artery disease, she will need treatment with a statin to achieve an LDL below 70.    Disposition:   FU with me in 1 month  Signed,  Kathlyn Sacramento, MD  08/09/2018 4:41 PM    [facility]

## 2018-08-09 NOTE — Patient Instructions (Addendum)
Medication Instructions:  START Aspirin 81 mg daily  If you need a refill on your cardiac medications before your next appointment, please call your pharmacy.   Lab work: None ordered  Testing/Procedures: Your physician has requested that you have an echocardiogram. Echocardiography is a painless test that uses sound waves to create images of your heart. It provides your doctor with information about the size and shape of your heart and how well your heart's chambers and valves are working. You may receive an ultrasound enhancing agent through an IV if needed to better visualize your heart during the echo.This procedure takes approximately one hour. There are no restrictions for this procedure. This will take place at the [facility] clinic.    Follow-Up: At [facility], you and your health needs are our priority.  As part of our continuing mission to provide you with exceptional heart care, we have created designated Provider Care Teams.  These Care Teams include your primary Cardiologist (physician) and Advanced Practice Providers (APPs -  Physician Assistants and Nurse Practitioners) who all work together to provide you with the care you need, when you need it. You will need a follow up appointment in 1 months.  You may see Dr. Fletcher Anon or one of the following Advanced Practice Providers on your designated Care Team:   Murray Hodgkins, NP Christell Faith, PA-C . Marrianne Mood, PA-C  Any Other Special Instructions Will Be Listed Below (If Applicable).    [address], [place] [address] Dept: [phone] Loc: [phone]  ANNIEMAE HABERKORN  08/09/2018  You are scheduled for a Cardiac Catheterization on Thursday, October 31 with Dr. Kathlyn [place].  1. Arrive at the [facility] entrance at (we will call you with the time), one hour prior to your procedure. Free valet  service is available.  After entering the [facility] please check-in at the registration desk (1st desk on your right) to receive your armband. After receiving your armband someone will escort you to the cardiac cath/special procedures waiting area.  If you have any questions, please call our office at [phone], or you may call the cardiac cath lab at [facility] directly at [phone]   Special note: Every effort is made to have your procedure done on time. Please understand that emergencies sometimes delay scheduled procedures.   2. Diet: Do not eat solid foods after midnight.  The patient may have clear liquids until 5am upon the day of the procedure.  3. Labs: Have already been completed  4. Medication instructions in preparation for your procedure: Hold the Hydrochlorothiazide the morning of the procedure  On the morning of your procedure, take your Aspirin and any morning medicines NOT listed above.  You may use sips of water.  5. Plan for one night stay--bring personal belongings. 6. Bring a current list of your medications and current insurance cards. 7. You MUST have a responsible person to drive you home. 8. Someone MUST be with you the first 24 hours after you arrive home or your discharge will be delayed. 9. Please wear clothes that are easy to get on and off and wear slip-on shoes.  Thank you for allowing [place] to care for you!   -- [facility]

## 2018-08-10 ENCOUNTER — Telehealth: Payer: Self-pay | Admitting: *Deleted

## 2018-08-10 ENCOUNTER — Ambulatory Visit (INDEPENDENT_AMBULATORY_CARE_PROVIDER_SITE_OTHER): Payer: Self-pay

## 2018-08-10 DIAGNOSIS — R079 Chest pain, unspecified: Secondary | ICD-10-CM

## 2018-08-10 DIAGNOSIS — R0602 Shortness of breath: Secondary | ICD-10-CM

## 2018-08-10 NOTE — Telephone Encounter (Signed)
Patient called back and she verbalized understanding of arrival time of 0830 am to the [facility] for heart cath tomorrow. She has been given pre-procedural instructions on AVS yesterday at her office visit.

## 2018-08-10 NOTE — Telephone Encounter (Signed)
Called scheduling to make sure patient is added on for cath tomorrow morning with Dr Fletcher Anon. First slot available is 0930, arrival time for patient of 0830 am.   No answer. Left message to call back.

## 2018-08-10 NOTE — Telephone Encounter (Signed)
Done- See other telephone encounter from today.

## 2018-08-10 NOTE — Telephone Encounter (Signed)
-----   Message from Ricci Barker, RN sent at 08/09/2018  5:25 PM EDT ----- Regarding: Schedule a left heart cath [facility]...can you guys schedule this left heart cath for 10/31 with Dr. Fletcher Anon. Earliest slot as possible. It is for chest pain. I called and left a message but they had already left.  She already has instructions. She just needs a call with the time.  Thank you and sorry! Didn't want to add anything to you

## 2018-08-11 ENCOUNTER — Encounter
Admission: RE | Disposition: A | Discharge status: Home / Self Care | Payer: Self-pay | Source: Ambulatory Visit | Attending: Cardiovascular Disease

## 2018-08-11 ENCOUNTER — Encounter: Payer: Self-pay | Admitting: *Deleted

## 2018-08-11 ENCOUNTER — Ambulatory Visit
Admission: RE | Admit: 2018-08-11 | Discharge: 2018-08-11 | Disposition: A | Discharge status: Home / Self Care | Payer: No Typology Code available for payment source | Source: Ambulatory Visit | Attending: Cardiovascular Disease | Admitting: Cardiovascular Disease

## 2018-08-11 ENCOUNTER — Other Ambulatory Visit: Payer: Self-pay

## 2018-08-11 DIAGNOSIS — I200 Unstable angina: Secondary | ICD-10-CM

## 2018-08-11 DIAGNOSIS — E781 Pure hyperglyceridemia: Secondary | ICD-10-CM | POA: Insufficient documentation

## 2018-08-11 DIAGNOSIS — H8110 Benign paroxysmal vertigo, unspecified ear: Secondary | ICD-10-CM | POA: Insufficient documentation

## 2018-08-11 DIAGNOSIS — Z6841 Body Mass Index (BMI) 40.0 and over, adult: Secondary | ICD-10-CM | POA: Insufficient documentation

## 2018-08-11 DIAGNOSIS — D509 Iron deficiency anemia, unspecified: Secondary | ICD-10-CM | POA: Insufficient documentation

## 2018-08-11 DIAGNOSIS — R7303 Prediabetes: Secondary | ICD-10-CM | POA: Insufficient documentation

## 2018-08-11 DIAGNOSIS — Z888 Allergy status to other drugs, medicaments and biological substances status: Secondary | ICD-10-CM | POA: Insufficient documentation

## 2018-08-11 DIAGNOSIS — Z8249 Family history of ischemic heart disease and other diseases of the circulatory system: Secondary | ICD-10-CM | POA: Insufficient documentation

## 2018-08-11 DIAGNOSIS — N183 Chronic kidney disease, stage 3 (moderate): Secondary | ICD-10-CM | POA: Insufficient documentation

## 2018-08-11 DIAGNOSIS — E669 Obesity, unspecified: Secondary | ICD-10-CM | POA: Insufficient documentation

## 2018-08-11 DIAGNOSIS — R0602 Shortness of breath: Secondary | ICD-10-CM | POA: Insufficient documentation

## 2018-08-11 DIAGNOSIS — M1990 Unspecified osteoarthritis, unspecified site: Secondary | ICD-10-CM | POA: Insufficient documentation

## 2018-08-11 DIAGNOSIS — R072 Precordial pain: Secondary | ICD-10-CM | POA: Insufficient documentation

## 2018-08-11 DIAGNOSIS — Z7982 Long term (current) use of aspirin: Secondary | ICD-10-CM | POA: Insufficient documentation

## 2018-08-11 DIAGNOSIS — Z86010 Personal history of colonic polyps: Secondary | ICD-10-CM | POA: Insufficient documentation

## 2018-08-11 DIAGNOSIS — Z90722 Acquired absence of ovaries, bilateral: Secondary | ICD-10-CM | POA: Insufficient documentation

## 2018-08-11 DIAGNOSIS — E039 Hypothyroidism, unspecified: Secondary | ICD-10-CM | POA: Insufficient documentation

## 2018-08-11 DIAGNOSIS — Z9049 Acquired absence of other specified parts of digestive tract: Secondary | ICD-10-CM | POA: Insufficient documentation

## 2018-08-11 DIAGNOSIS — Z881 Allergy status to other antibiotic agents status: Secondary | ICD-10-CM | POA: Insufficient documentation

## 2018-08-11 DIAGNOSIS — K219 Gastro-esophageal reflux disease without esophagitis: Secondary | ICD-10-CM | POA: Insufficient documentation

## 2018-08-11 DIAGNOSIS — Z79899 Other long term (current) drug therapy: Secondary | ICD-10-CM | POA: Insufficient documentation

## 2018-08-11 DIAGNOSIS — Z885 Allergy status to narcotic agent status: Secondary | ICD-10-CM | POA: Insufficient documentation

## 2018-08-11 DIAGNOSIS — Z8614 Personal history of Methicillin resistant Staphylococcus aureus infection: Secondary | ICD-10-CM | POA: Insufficient documentation

## 2018-08-11 DIAGNOSIS — Z79890 Hormone replacement therapy (postmenopausal): Secondary | ICD-10-CM | POA: Insufficient documentation

## 2018-08-11 DIAGNOSIS — I129 Hypertensive chronic kidney disease with stage 1 through stage 4 chronic kidney disease, or unspecified chronic kidney disease: Secondary | ICD-10-CM | POA: Insufficient documentation

## 2018-08-11 DIAGNOSIS — Z90710 Acquired absence of both cervix and uterus: Secondary | ICD-10-CM | POA: Insufficient documentation

## 2018-08-11 DIAGNOSIS — Z9889 Other specified postprocedural states: Secondary | ICD-10-CM | POA: Insufficient documentation

## 2018-08-11 DIAGNOSIS — G4700 Insomnia, unspecified: Secondary | ICD-10-CM | POA: Insufficient documentation

## 2018-08-11 DIAGNOSIS — F410 Panic disorder [episodic paroxysmal anxiety] without agoraphobia: Secondary | ICD-10-CM | POA: Insufficient documentation

## 2018-08-11 DIAGNOSIS — R079 Chest pain, unspecified: Secondary | ICD-10-CM

## 2018-08-11 DIAGNOSIS — F329 Major depressive disorder, single episode, unspecified: Secondary | ICD-10-CM | POA: Insufficient documentation

## 2018-08-11 HISTORY — PX: LEFT HEART CATH AND CORONARY ANGIOGRAPHY: CATH118249

## 2018-08-11 SURGERY — LEFT HEART CATH AND CORONARY ANGIOGRAPHY
Anesthesia: Moderate Sedation | Laterality: Left

## 2018-08-11 MED ORDER — VERAPAMIL HCL 2.5 MG/ML IV SOLN
INTRAVENOUS | Status: DC | PRN
  Administered 2018-08-11: 2.5 mg via INTRACORONARY

## 2018-08-11 MED ORDER — DIPHENHYDRAMINE HCL 50 MG/ML IJ SOLN
INTRAMUSCULAR | Status: AC
  Filled 2018-08-11: qty 1

## 2018-08-11 MED ORDER — SODIUM CHLORIDE 0.9% FLUSH: 3.0000 mL | INTRAVENOUS | Status: DC | PRN

## 2018-08-11 MED ORDER — SODIUM CHLORIDE 0.9 % IV SOLN: 250.0000 mL | INTRAVENOUS | Status: DC | PRN

## 2018-08-11 MED ORDER — FENTANYL CITRATE (PF) 100 MCG/2ML IJ SOLN
INTRAMUSCULAR | Status: AC
  Filled 2018-08-11: qty 2

## 2018-08-11 MED ORDER — VERAPAMIL HCL 2.5 MG/ML IV SOLN
INTRAVENOUS | Status: AC
  Filled 2018-08-11: qty 2

## 2018-08-11 MED ORDER — SODIUM CHLORIDE 0.9% FLUSH: 3.0000 mL | Freq: Two times a day (BID) | INTRAVENOUS | Status: DC

## 2018-08-11 MED ORDER — IOPAMIDOL (ISOVUE-300) INJECTION 61%
INTRAVENOUS | Status: DC | PRN
  Administered 2018-08-11: 80 mL via INTRAVENOUS

## 2018-08-11 MED ORDER — HEPARIN SODIUM (PORCINE) 1000 UNIT/ML IJ SOLN
INTRAMUSCULAR | Status: AC
  Filled 2018-08-11: qty 1

## 2018-08-11 MED ORDER — MIDAZOLAM HCL 2 MG/2ML IJ SOLN
INTRAMUSCULAR | Status: DC | PRN
  Administered 2018-08-11: 1 mg via INTRAVENOUS

## 2018-08-11 MED ORDER — ASPIRIN 81 MG PO CHEW: 81.0000 mg | CHEWABLE_TABLET | ORAL | Status: DC

## 2018-08-11 MED ORDER — MIDAZOLAM HCL 2 MG/2ML IJ SOLN
INTRAMUSCULAR | Status: AC
  Filled 2018-08-11: qty 2

## 2018-08-11 MED ORDER — HEPARIN (PORCINE) IN NACL 1000-0.9 UT/500ML-% IV SOLN
INTRAVENOUS | Status: AC
  Filled 2018-08-11: qty 1000

## 2018-08-11 MED ORDER — HEPARIN SODIUM (PORCINE) 1000 UNIT/ML IJ SOLN
INTRAMUSCULAR | Status: DC | PRN
  Administered 2018-08-11: 6000 [IU] via INTRAVENOUS

## 2018-08-11 MED ORDER — DIPHENHYDRAMINE HCL 50 MG/ML IJ SOLN
INTRAMUSCULAR | Status: DC | PRN
  Administered 2018-08-11: 25 mg via INTRAVENOUS

## 2018-08-11 MED ORDER — SODIUM CHLORIDE 0.9 % IV SOLN
INTRAVENOUS | Status: DC
  Administered 2018-08-11: 09:00:00 via INTRAVENOUS

## 2018-08-11 SURGICAL SUPPLY — 7 items
CATH INFINITI 5FR JK (CATHETERS) ×3 IMPLANT
CATH INFINITI JR4 5F (CATHETERS) ×3 IMPLANT
DEVICE RAD COMP TR BAND LRG (VASCULAR PRODUCTS) ×3 IMPLANT
GLIDESHEATH SLEND SS 6F .021 (SHEATH) ×3 IMPLANT
KIT MANI 3VAL PERCEP (MISCELLANEOUS) ×3 IMPLANT
PACK CARDIAC CATH (CUSTOM PROCEDURE TRAY) ×3 IMPLANT
WIRE ROSEN-J .035X260CM (WIRE) ×3 IMPLANT

## 2018-08-11 NOTE — Discharge Instructions (Signed)
Moderate Conscious Sedation, Adult, Care After °These instructions provide you with information about caring for yourself after your procedure. Your health care provider may also give you more specific instructions. Your treatment has been planned according to current medical practices, but problems sometimes occur. Call your health care provider if you have any problems or questions after your procedure. °What can I expect after the procedure? °After your procedure, it is common: °· To feel sleepy for several hours. °· To feel clumsy and have poor balance for several hours. °· To have poor judgment for several hours. °· To vomit if you eat too soon. ° °Follow these instructions at home: °For at least 24 hours after the procedure: ° °· Do not: °? Participate in activities where you could fall or become injured. °? Drive. °? Use heavy machinery. °? Drink alcohol. °? Take sleeping pills or medicines that cause drowsiness. °? Make important decisions or sign legal documents. °? Take care of children on your own. °· Rest. °Eating and drinking °· Follow the diet recommended by your health care provider. °· If you vomit: °? Drink water, juice, or soup when you can drink without vomiting. °? Make sure you have little or no nausea before eating solid foods. °General instructions °· Have a responsible adult stay with you until you are awake and alert. °· Take over-the-counter and prescription medicines only as told by your health care provider. °· If you smoke, do not smoke without supervision. °· Keep all follow-up visits as told by your health care provider. This is important. °Contact a health care provider if: °· You keep feeling nauseous or you keep vomiting. °· You feel light-headed. °· You develop a rash. °· You have a fever. °Get help right away if: °· You have trouble breathing. °This information is not intended to replace advice given to you by your health care provider. Make sure you discuss any questions you have  with your health care provider. °Document Released: 07/19/2013 Document Revised: 03/02/2016 Document Reviewed: 01/18/2016 °Elsevier Interactive Patient Education © 2018 Else[employer] Inc[employer] °Angiogram, Care After °This sheet gives you information about how to care for yourself after your procedure. Your health care provider may also give you more specific instructions. If you have problems or questions, contact your health care provider. °What can I expect after the procedure? °After the procedure, it is common to have bruising and tenderness at the catheter insertion area. °Follow these instructions at home: °Insertion site care °· Follow instructions from your health care provider about how to take care of your insertion site. Make sure you: °? Wash your hands with soap and water before you change your bandage (dressing). If soap and water are not available, use hand sanitizer. °? Change your dressing as told by your health care provider. °? Leave stitches (sutures), skin glue, or adhesive strips in place. These skin closures may need to stay in place for 2 weeks or longer. If adhesive strip edges start to loosen and curl up, you may trim the loose edges. Do not remove adhesive strips completely unless your health care provider tells you to do that. °· Do not take baths, swim, or use a hot tub until your health care provider approves. °· You may shower 24-48 hours after the procedure or as told by your health care provider. °? Gently wash the site with plain soap and water. °? Pat the area dry with a clean towel. °? Do not rub the site. This may cause bleeding. °· Do   not apply powder or lotion to the site. Keep the site clean and dry. °· Check your insertion site every day for signs of infection. Check for: °? Redness, swelling, or pain. °? Fluid or blood. °? Warmth. °? Pus or a bad smell. °Activity °· Rest as told by your health care provider, usually for 1-2 days. °· Do not lift anything that is heavier than 10 lbs.  (4.5 kg) or as told by your health care provider. °· Do not drive for 24 hours if you were given a medicine to help you relax (sedative). °· Do not drive or use heavy machinery while taking prescription pain medicine. °General instructions °· Return to your normal activities as told by your health care provider, usually in about a week. Ask your health care provider what activities are safe for you. °· If the catheter site starts bleeding, lie flat and put pressure on the site. If the bleeding does not stop, get help right away. This is a medical emergency. °· Drink enough fluid to keep your urine clear or pale yellow. This helps flush the contrast dye from your body. °· Take over-the-counter and prescription medicines only as told by your health care provider. °· Keep all follow-up visits as told by your health care provider. This is important. °Contact a health care provider if: °· You have a fever or chills. °· You have redness, swelling, or pain around your insertion site. °· You have fluid or blood coming from your insertion site. °· The insertion site feels warm to the touch. °· You have pus or a bad smell coming from your insertion site. °· You have bruising around the insertion site. °· You notice blood collecting in the tissue around the catheter site (hematoma). The hematoma may be painful to the touch. °Get help right away if: °· You have severe pain at the catheter insertion area. °· The catheter insertion area swells very fast. °· The catheter insertion area is bleeding, and the bleeding does not stop when you hold steady pressure on the area. °· The area near or just beyond the catheter insertion site becomes pale, cool, tingly, or numb. °These symptoms may represent a serious problem that is an emergency. Do not wait to see if the symptoms will go away. Get medical help right away. Call your local emergency services (911 in the [place] Do not drive yourself to the hospital. °Summary °· After the  procedure, it is common to have bruising and tenderness at the catheter insertion area. °· After the procedure, it is important to rest and drink plenty of fluids. °· Do not take baths, swim, or use a hot tub until your health care provider says it is okay to do so. You may shower 24-48 hours after the procedure or as told by your health care provider. °· If the catheter site starts bleeding, lie flat and put pressure on the site. If the bleeding does not stop, get help right away. This is a medical emergency. °This information is not intended to replace advice given to you by your health care provider. Make sure you discuss any questions you have with your health care provider. °Document Released: 04/16/2005 Document Revised: 09/02/2016 Document Reviewed: 09/02/2016 °Elsevier Interactive Patient Education © 2018 Elsevier Inc. ° °

## 2018-08-11 NOTE — Interval H&P Note (Signed)
History and Physical Interval Note: Cath Lab Visit (complete for each Cath Lab visit)  Clinical Evaluation Leading to the Procedure:   ACS: No.  Non-ACS:    Anginal Classification: CCS IV  Anti-ischemic medical therapy: Minimal Therapy (1 class of medications)  Non-Invasive Test Results: No non-invasive testing performed  Prior CABG: No previous CABG       08/11/2018 10:11 AM  [name]  has presented today for surgery, with the diagnosis of LT Cath    Chest pain  The various methods of treatment have been discussed with the patient and family. After consideration of risks, benefits and other options for treatment, the patient has consented to  Procedure(s): LEFT HEART CATH AND CORONARY ANGIOGRAPHY (Left) as a surgical intervention .  The patient's history has been reviewed, patient examined, no change in status, stable for surgery.  I have reviewed the patient's chart and labs.  Questions were answered to the patient's satisfaction.     [name]

## 2018-08-11 NOTE — OR Nursing (Signed)
4 iv attempts infiltrated. One successful stick  And infusion. Cath lab notified they will inform Dr Fletcher Anon Second IV not completed

## 2018-09-01 ENCOUNTER — Ambulatory Visit: Payer: No Typology Code available for payment source | Admitting: Nurse Practitioner

## 2018-09-12 ENCOUNTER — Encounter: Payer: Self-pay | Admitting: *Deleted

## 2018-09-12 ENCOUNTER — Encounter: Payer: Self-pay | Admitting: Primary Care

## 2018-09-12 ENCOUNTER — Ambulatory Visit (INDEPENDENT_AMBULATORY_CARE_PROVIDER_SITE_OTHER): Payer: No Typology Code available for payment source | Admitting: Primary Care

## 2018-09-12 VITALS — BP 120/76 | HR 66 | Temp 98.0°F | Ht 61.0 in | Wt 268.5 lb

## 2018-09-12 DIAGNOSIS — G8929 Other chronic pain: Secondary | ICD-10-CM

## 2018-09-12 DIAGNOSIS — M5441 Lumbago with sciatica, right side: Secondary | ICD-10-CM

## 2018-09-12 LAB — POC URINALSYSI DIPSTICK (AUTOMATED)
BILIRUBIN UA: NEGATIVE
Blood, UA: NEGATIVE
GLUCOSE UA: NEGATIVE
KETONES UA: NEGATIVE
LEUKOCYTES UA: NEGATIVE
NITRITE UA: NEGATIVE
PH UA: 6 (ref 5.0–8.0)
Protein, UA: NEGATIVE
Spec Grav, UA: 1.02 (ref 1.010–1.025)
Urobilinogen, UA: 0.2 E.U./dL

## 2018-09-12 MED ORDER — PREDNISONE 20 MG PO TABS: ORAL_TABLET | ORAL | 0 refills | Status: DC

## 2018-09-12 NOTE — Addendum Note (Signed)
Addended by: [name] on: 09/12/2018 10:08 AM   Modules accepted: Orders

## 2018-09-12 NOTE — Assessment & Plan Note (Addendum)
Located to right lower back, intermittently, for nearly 2 years. 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 Given continued symptoms, also with some mention of difficulty holding bladder and right lower extremity weakness, will send to orthopedics for further evaluation.  UA today negative. Doesn't appear to be renal stones.  Rx for prednisone course sent to pharmacy. Consider gabapentin if needed. Avoid NSAID's due to CKD. She will update.

## 2018-09-12 NOTE — Patient Instructions (Signed)
Start prednisone 20 mg tablets for back pain. Take 3 tablets for three days, then 2 tablets for three days, then 1 tablet for three days.  Stop by the front desk and speak with either Rosaria Ferries or Azalee Course regarding your referral to neurosurgery.  Make sure to stretch your lower back and right leg. You can try applying heat/ice.   Do not take medications like Ibuprofen, Advil, Motrin, Aleeve, naproxen as they can be damaging to the kidneys.  It was a pleasure to see you today!

## 2018-09-12 NOTE — Addendum Note (Signed)
Addended by: Jacqualin Combes on: 09/12/2018 10:23 AM   Modules accepted: Orders

## 2018-09-12 NOTE — Progress Notes (Signed)
Subjective:    Patient ID: [name], female    DOB: 1961-09-20, 57 y.o.   MRN: [id number]  HPI  [name] is a 57 year old female with a history of unstable angina, hypertension, hypothyroidism, BPPV, CKD stage III, obesity who presents today with a chief complaint of back pain.  Her pain is located the right lower back, also with radiation to her right lower extremity. Her pain is worse with prolonged standing, walking upstairs, laying. She cannot stand/walk for more than 10 minutes without experiencing pain. She has noticed a feeling of feeling unsteady feeling to her right lower extremity at times, also with intermittent numbness/tingling.  Symptoms began around two years ago, has been intermittent. Worse over the last 2-3 weeks. She denies increased activity, trauma. Has noticed that it's harder to hold her bladder, denies loss of bowel control. She's never undergone injections to her lower spine or seen a specialist.   Review of Systems  Genitourinary:       Some difficulty with holding bladder  Musculoskeletal: Positive for back pain.  Skin: Negative for color change.  Neurological: Positive for weakness and numbness.       Past Medical History:  Diagnosis Date  . Acute kidney injury ([place]) 05/19/2017  . Acute myofascial strain of lumbosacral region 08/20/2016  . Allergic rhinitis   . Anemia, iron deficiency   . ANEMIA, IRON DEFICIENCY, history of 04/05/2008   Qualifier: Diagnosis of  By: Philbert Riser MD, [place]    . Anginal pain [facility])    last cardiologist visit 2010 - dx/ed with anxiety instead of cardiac pain  . Arthritis   . Atypical chest pain    Negative cardiolite at [facility] cards 11/07  . BPPV (benign paroxysmal positional vertigo) 01/07/2017  . CHOLECYSTECTOMY, HX OF    Annotation: 2000 Qualifier: Diagnosis of  By: Eddie Dibbles MD, Bhakti    . Chronic left maxillary sinus disease 09/22/2012  . Chronic low back pain 11/08/2015  . Depression   . Diarrhea 05/19/2017  .  Diverticulitis   . Elevated BP   . Family history of leukemia 09/22/2012   She states that both her uncle and his son was diagnosed with Leukemia. She would like to know more information about the leukemia.       Marland Kitchen GERD (gastroesophageal reflux disease)    no meds  . Hepatic steatosis 09/22/2012   Incidental finding on abd CT in 2011   . History of blood transfusion 03/2008   Requried 2u prbc; menometrorrhagia, uterine fibroids.  . History of methicillin resistant staphylococcus aureus (MRSA) 2017   PCR+  . HTN (hypertension) 02/09/2014  . Hx of colonic polyp 10/26/2017  . Hypertension   . Hypertriglyceridemia    164 - 2/11  . Hypothyroidism   . Injury of left rotator cuff 12/02/2016  . Insomnia   . INSOMNIA UNSPECIFIED 05/01/2010   Qualifier: Diagnosis of  By: Shon Baton, MD, Janett Billow    . Kidney disease    STAGE 3-SEES DR PATEL AT [place]  . Metabolic syndrome 38/07/1750  . Nausea 05/19/2017  . Nausea vomiting and diarrhea   . 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 Obesity    250 lbs 8/11  . OBESITY 08/31/2006   Annotation: Wt. 248 lbs. 11/2004 Qualifier: Diagnosis of  By: Eddie Dibbles MD, Bhakti    . Panic disorder 11/05  . PANIC DISORDER 08/31/2006   Diagnosed 11/05, well-controlled with Paxil, patient also takes albuterol when she has a panic attack, maybe once/month   .  Pectus excavatum 09/22/2012   Incidental finding on CXR in 2013.    Marland Kitchen Perirectal abscess 11/14/2011  . Prediabetes 09/22/2012  . Preventative health care 02/09/2014  . Proteinuria 07/17/2012  . Urinary incontinence without sensory awareness 12/17/2016  . Venous (peripheral) insufficiency 11/08/2015     Social History   Socioeconomic History  . Marital status: Married    Spouse name: Not on file  . Number of children: Not on file  . Years of education: Not on file  . Highest education level: Not on file  Occupational History  . Not on file  Social Needs  . Financial resource strain: Not on file  . Food  insecurity:    Worry: Not on file    Inability: Not on file  . Transportation needs:    Medical: Not on file    Non-medical: Not on file  Tobacco Use  . Smoking status: Never Smoker  . Smokeless tobacco: Never Used  Substance and Sexual Activity  . Alcohol use: No    Alcohol/week: 0.0 standard drinks  . Drug use: No  . Sexual activity: Yes    Birth control/protection: None  Lifestyle  . Physical activity:    Days per week: Not on file    Minutes per session: Not on file  . Stress: Not on file  Relationships  . Social connections:    Talks on phone: Not on file    Gets together: Not on file    Attends religious service: Not on file    Active member of club or organization: Not on file    Attends meetings of clubs or organizations: Not on file    Relationship status: Not on file  . Intimate partner violence:    Fear of current or ex partner: Not on file    Emotionally abused: Not on file    Physically abused: Not on file    Forced sexual activity: Not on file  Other Topics Concern  . Not on file  Social History Narrative   Currently unemployed. Starting college in Aug for accounting.  Married, has 2 children lives with her youngest son.      Past Surgical History:  Procedure Laterality Date  . ABDOMINAL HYSTERECTOMY  04/2007   total abdominal hysterectomy ( cervix) with bilateral salpingo-oopherectomy  . APPENDECTOMY  02/23/2018   Procedure: APPENDECTOMY;  Surgeon: Robert Bellow, MD;  Location: [facility];  Service: General;;  . CHOLECYSTECTOMY  07/30/1999   Diagnosis of  By: Eddie Dibbles MD, Bhakti    . DILATION AND CURETTAGE OF UTERUS  1987  . INCISION AND DRAINAGE ABSCESS  11/02/2012   Procedure: INCISION AND DRAINAGE ABSCESS;  Surgeon: Odis Hollingshead, MD;  Location: [facility];  Service: General;  Laterality: N/A;  I&D anorectal abscess  . INCISION AND DRAINAGE PERIRECTAL ABSCESS  11/14/2011   Procedure: IRRIGATION AND DEBRIDEMENT PERIRECTAL ABSCESS;  Surgeon: Zenovia Jarred, MD;  Location: [facility];  Service: General;  Laterality: N/A;  . LAPAROSCOPIC SIGMOID COLECTOMY N/A 02/23/2018   Procedure: LAPAROSCOPIC ASSISTED SIGMOID COLECTOMY;  Surgeon: Robert Bellow, MD;  Location: [facility];  Service: General;  Laterality: N/A;  . LEFT HEART CATH AND CORONARY ANGIOGRAPHY Left 08/11/2018   Procedure: LEFT HEART CATH AND CORONARY ANGIOGRAPHY;  Surgeon: Wellington Hampshire, MD;  Location: [facility];  Service: Cardiovascular;  Laterality: Left;  . NERVE, TENDON AND ARTERY REPAIR Left 06/11/2013   Procedure: EXPLORATION AND REPAIR LEFT FOREARM;  Surgeon: Schuyler Amor, MD;  Location: [facility];  Service: Orthopedics;  Laterality: Left;  . SHOULDER ARTHROSCOPY WITH ROTATOR CUFF REPAIR AND SUBACROMIAL DECOMPRESSION Left 03/06/2016   Procedure: LEFT SHOULDER ARTHROSCOPY WITH ROTATOR CUFF REPAIR AND SUBACROMIAL DECOMPRESSION, DISTAL CLAVICLE EXCISION;  Surgeon: Leandrew Koyanagi, MD;  Location: [facility];  Service: Orthopedics;  Laterality: Left;    Family History  Problem Relation Age of Onset  . Heart attack Mother        at the age of 44's  . Lung cancer Mother        metestatic, + smoker  . Aneurysm Mother   . Hyperlipidemia Mother   . Hypertension Mother   . Hypertension Father   . Heart attack Maternal Grandmother        at the age of 97's  . Breast cancer Maternal Aunt   . Colon cancer Neg Hx   . Stomach cancer Neg Hx   . Esophageal cancer Neg Hx   . Rectal cancer Neg Hx     Allergies  Allergen Reactions  . Cephalexin Itching  . Doxycycline Nausea And Vomiting  . Dilaudid [Hydromorphone Hcl] Other (See Comments)    Panic attack  . Sulfur Itching and Rash    Current Outpatient Medications on File Prior to Visit  Medication Sig Dispense Refill  . acetaminophen (TYLENOL) 500 MG tablet Take 1,000 mg by mouth every 8 (eight) hours as needed for mild pain.    Marland Kitchen amLODipine (NORVASC) 10 MG tablet Take 1 tablet by mouth once daily for blood pressure.  (Patient taking differently: Take 10 mg by mouth at bedtime. Take 1 tablet by mouth once daily for blood pressure.) 90 tablet 3  . buPROPion (WELLBUTRIN XL) 300 MG 24 hr tablet Take 300 mg by mouth at bedtime.     . hydrochlorothiazide (HYDRODIURIL) 25 MG tablet Take 1 tablet (25 mg total) by mouth daily. 90 tablet 3  . lisinopril (PRINIVIL,ZESTRIL) 30 MG tablet Take 1 tablet (30 mg total) by mouth daily. 90 tablet 0  . nitroGLYCERIN (NITROSTAT) 0.4 MG SL tablet Place 1 tablet (0.4 mg total) under the tongue every 5 (five) minutes as needed for chest pain. 50 tablet 0  . PARoxetine (PAXIL) 20 MG tablet Take 1.5 tablets (30 mg total) by mouth daily. (Patient taking differently: Take 30 mg by mouth at bedtime. ) 135 tablet 2  . traZODone (DESYREL) 100 MG tablet Take 100 mg by mouth at bedtime.    Marland Kitchen levothyroxine (SYNTHROID) 88 MCG tablet Take 1 tablet by mouth every morning on an empty stomach with a full glass of water. (Patient taking differently: Take 88 mcg by mouth at bedtime. Take 1 tablet by mouth every morning on an empty stomach with a full glass of water.) 90 tablet 2   No current facility-administered medications on file prior to visit.     BP 120/76   Pulse 66   Temp 98 F (36.7 C) (Oral)   Ht 5\' 1"  (1.549 m)   Wt 268 lb 8 oz (121.8 kg)   LMP 06/17/2007   SpO2 99%   BMI 50.73 kg/m    Objective:   Physical Exam  Constitutional: She appears well-nourished.  Neck: Neck supple.  Cardiovascular: Normal rate and regular rhythm.  Respiratory: Effort normal and breath sounds normal.  Musculoskeletal:       Lumbar back: She exhibits decreased range of motion and pain. She exhibits no tenderness and no bony tenderness.       Back:  3/5 strength to  right lower extremity, 5/5 to left. Negative straight leg raise bilaterally.   Skin: Skin is warm and dry.           Assessment & Plan:

## 2018-09-21 ENCOUNTER — Ambulatory Visit (INDEPENDENT_AMBULATORY_CARE_PROVIDER_SITE_OTHER): Payer: No Typology Code available for payment source | Admitting: Nurse Practitioner

## 2018-09-21 ENCOUNTER — Encounter: Payer: Self-pay | Admitting: Nurse Practitioner

## 2018-09-21 VITALS — BP 100/60 | HR 71 | Ht 61.0 in | Wt 273.0 lb

## 2018-09-21 DIAGNOSIS — I10 Essential (primary) hypertension: Secondary | ICD-10-CM

## 2018-09-21 DIAGNOSIS — R0609 Other forms of dyspnea: Secondary | ICD-10-CM

## 2018-09-21 DIAGNOSIS — R002 Palpitations: Secondary | ICD-10-CM

## 2018-09-21 DIAGNOSIS — R079 Chest pain, unspecified: Secondary | ICD-10-CM

## 2018-09-21 NOTE — Patient Instructions (Signed)
Medication Instructions:  No changes  If you need a refill on your cardiac medications before your next appointment, please call your pharmacy.   Lab work: Your provider would like for you to have the following labs today: BMET and TSH  If you have labs (blood work) drawn today and your tests are completely normal, you will receive your results only by: Marland Kitchen MyChart Message (if you have MyChart) OR . A paper copy in the mail If you have any lab test that is abnormal or we need to change your treatment, we will call you to review the results.  Testing/Procedures: Your physician has recommended that you wear an 30 day event monitor. Event monitors are medical devices that record the heart's electrical activity. Doctors most often [place] these monitors to diagnose arrhythmias. Arrhythmias are problems with the speed or rhythm of the heartbeat. The monitor is a small, portable device. You can wear one while you do your normal daily activities. This is usually used to diagnose what is causing palpitations/syncope (passing out).  - You will be mailed a monitor from [employer].  - They will call you in the next day or so to verify your address. Then it will take 5-7 days to be mailed to you. - You will wear for 30 days and then place all the pieces of equipment that came with the device back in the provided box and take it to your nearest UPS drop off locations. - Call [employer] at [phone], if you have any questions concerning the monitor once you have received it. - DO NOT GET THE MONITOR WET.  Follow-Up: At [facility], you and your health needs are our priority.  As part of our continuing mission to provide you with exceptional heart care, we have created designated Provider Care Teams.  These Care Teams include your primary Cardiologist (physician) and Advanced Practice Providers (APPs -  Physician Assistants and Nurse Practitioners) who all work together to provide you with the care you  need, when you need it. You will need a follow up appointment in 2 months. You may see Kathlyn Sacramento, MD or one of the following Advanced Practice Providers on your designated Care Team:   Murray Hodgkins, NP Christell Faith, PA-C . Marrianne Mood, PA-C

## 2018-09-21 NOTE — Progress Notes (Signed)
Office Visit    Patient Name: [name] Date of Encounter: 09/21/2018  Primary Care Provider:  [name], NP Primary Cardiologist:  Kathlyn Sacramento, MD  Chief Complaint    57 year old female with a history of exertional chest pain, hypertension, stage III chronic kidney disease, hyperlipidemia, and obesity, who presents for follow-up after recent diagnostic catheterization which showed normal coronary arteries.  Past Medical History    Past Medical History:  Diagnosis Date  . Acute kidney injury ([place]) 05/19/2017  . Acute myofascial strain of lumbosacral region 08/20/2016  . Allergic rhinitis   . ANEMIA, IRON DEFICIENCY, history of 04/05/2008   Qualifier: Diagnosis of  By: Philbert Riser MD, [place]    . Anginal pain ([place])    a. 08/2006 nl stress testing; b. 07/2018 Cath: Nl cors, EF 55-65%.  . Arthritis   . BPPV (benign paroxysmal positional vertigo) 01/07/2017  . CHOLECYSTECTOMY, HX OF    Annotation: 2000 Qualifier: Diagnosis of  By: Eddie Dibbles MD, Bhakti    . Chronic left maxillary sinus disease 09/22/2012  . Chronic low back pain 11/08/2015  . CKD (chronic kidney disease), stage III ([place])    a. Followed by [facility]) in [place].  Marland Kitchen Depression   . Diarrhea 05/19/2017  . Diverticulitis   . Essential hypertension 02/09/2014  . Family history of leukemia 09/22/2012   She states that both her uncle and his son was diagnosed with Leukemia. She would like to know more information about the leukemia.       Marland Kitchen GERD (gastroesophageal reflux disease)    no meds  . Hepatic steatosis 09/22/2012   Incidental finding on abd CT in 2011   . History of blood transfusion 03/2008   Requried 2u prbc; menometrorrhagia, uterine fibroids.  . History of methicillin resistant staphylococcus aureus (MRSA) 2017   PCR+  . Hx of colonic polyp 10/26/2017  . Hypertension   . Hypertriglyceridemia    164 - 2/11  . Hypothyroidism   . Injury of left rotator cuff 12/02/2016  . Insomnia     . INSOMNIA UNSPECIFIED 05/01/2010   Qualifier: Diagnosis of  By: Shon Baton, MD, Janett Billow    . Metabolic syndrome 23/76/2831  . Nausea 05/19/2017  . Nausea vomiting and diarrhea   . 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 Obesity    250 lbs 8/11  . OBESITY 08/31/2006   Annotation: Wt. 248 lbs. 11/2004 Qualifier: Diagnosis of  By: Eddie Dibbles MD, Bhakti    . Panic disorder 11/05  . PANIC DISORDER 08/31/2006   Diagnosed 11/05, well-controlled with Paxil, patient also takes albuterol when she has a panic attack, maybe once/month   . Pectus excavatum 09/22/2012   Incidental finding on CXR in 2013.    Marland Kitchen Perirectal abscess 11/14/2011  . Prediabetes 09/22/2012  . Preventative health care 02/09/2014  . Proteinuria 07/17/2012  . Urinary incontinence without sensory awareness 12/17/2016  . Venous (peripheral) insufficiency 11/08/2015   Past Surgical History:  Procedure Laterality Date  . ABDOMINAL HYSTERECTOMY  04/2007   total abdominal hysterectomy ( cervix) with bilateral salpingo-oopherectomy  . APPENDECTOMY  02/23/2018   Procedure: APPENDECTOMY;  Surgeon: Robert Bellow, MD;  Location: [facility];  Service: General;;  . CHOLECYSTECTOMY  07/30/1999   Diagnosis of  By: Eddie Dibbles MD, Bhakti    . DILATION AND CURETTAGE OF UTERUS  1987  . INCISION AND DRAINAGE ABSCESS  11/02/2012   Procedure: INCISION AND DRAINAGE ABSCESS;  Surgeon: Odis Hollingshead, MD;  Location: [place];  Service: General;  Laterality: N/A;  I&D anorectal abscess  . INCISION AND DRAINAGE PERIRECTAL ABSCESS  11/14/2011   Procedure: IRRIGATION AND DEBRIDEMENT PERIRECTAL ABSCESS;  Surgeon: Zenovia Jarred, MD;  Location: [place];  Service: General;  Laterality: N/A;  . LAPAROSCOPIC SIGMOID COLECTOMY N/A 02/23/2018   Procedure: LAPAROSCOPIC ASSISTED SIGMOID COLECTOMY;  Surgeon: Robert Bellow, MD;  Location: [facility];  Service: General;  Laterality: N/A;  . LEFT HEART CATH AND CORONARY ANGIOGRAPHY Left 08/11/2018   Procedure: LEFT HEART CATH AND CORONARY  ANGIOGRAPHY;  Surgeon: Wellington Hampshire, MD;  Location: [place] [facility];  Service: Cardiovascular;  Laterality: Left;  . NERVE, TENDON AND ARTERY REPAIR Left 06/11/2013   Procedure: EXPLORATION AND REPAIR LEFT FOREARM;  Surgeon: Schuyler Amor, MD;  Location: [place];  Service: Orthopedics;  Laterality: Left;  . SHOULDER ARTHROSCOPY WITH ROTATOR CUFF REPAIR AND SUBACROMIAL DECOMPRESSION Left 03/06/2016   Procedure: LEFT SHOULDER ARTHROSCOPY WITH ROTATOR CUFF REPAIR AND SUBACROMIAL DECOMPRESSION, DISTAL CLAVICLE EXCISION;  Surgeon: Leandrew Koyanagi, MD;  Location: [place];  Service: Orthopedics;  Laterality: Left;    Allergies  Allergies  Allergen Reactions  . Cephalexin Itching  . Doxycycline Nausea And Vomiting  . Dilaudid [Hydromorphone Hcl] Other (See Comments)    Panic attack  . Sulfur Itching and Rash    History of Present Illness    57 year old female with the above complex past medical history including exertional chest pain, hypertension, stage III chronic kidney disease, hyperlipidemia, and obesity.  She was recently evaluated by Dr. Fletcher Anon at the end of October secondary to exertional chest pain.  Given symptoms and risk factors, decision was made to pursue diagnostic catheterization, which took place on October 31 and revealed normal coronary arteries with normal LV function.  Since her catheterization, she has continued to note exertional dyspnea, which is sometimes associated with chest tightness.  She remains relatively sedentary but recognizes that she needs to increase her activity.  Over the past month and a half, she has also had three episodes of tachypalpitations associated w/ lightheadedness, lasting < 10 secs, and resolving spontaneously.  The last such episode occurred 2 days ago while sitting on her couch.  She denies pnd, orthopnea, n, v, syncope, edema, weight gain, or early satiety.   Home Medications    Prior to Admission medications   Medication Sig Start Date  End Date Taking? Authorizing Provider  acetaminophen (TYLENOL) 500 MG tablet Take 1,000 mg by mouth every 8 (eight) hours as needed for mild pain.    [provider]  amLODipine (NORVASC) 10 MG tablet Take 1 tablet by mouth once daily for blood pressure. Patient taking differently: Take 10 mg by mouth at bedtime. Take 1 tablet by mouth once daily for blood pressure. 07/20/17   [name], NP  buPROPion (WELLBUTRIN XL) 300 MG 24 hr tablet Take 300 mg by mouth at bedtime.     [provider]  hydrochlorothiazide (HYDRODIURIL) 25 MG tablet Take 1 tablet (25 mg total) by mouth daily. 07/20/17   [name], NP  levothyroxine (SYNTHROID) 88 MCG tablet Take 1 tablet by mouth every morning on an empty stomach with a full glass of water. Patient taking differently: Take 88 mcg by mouth at bedtime. Take 1 tablet by mouth every morning on an empty stomach with a full glass of water. 07/20/17 08/10/18  [name], NP  lisinopril (PRINIVIL,ZESTRIL) 30 MG tablet Take 1 tablet (30 mg total) by mouth daily. 05/02/18  [name], NP  nitroGLYCERIN (NITROSTAT) 0.4 MG SL tablet Place 1 tablet (0.4 mg total) under the tongue every 5 (five) minutes as needed for chest pain. 08/08/18   Elby Beck, FNP  PARoxetine (PAXIL) 20 MG tablet Take 1.5 tablets (30 mg total) by mouth daily. Patient taking differently: Take 30 mg by mouth at bedtime.  07/20/17   [name], NP  predniSONE (DELTASONE) 20 MG tablet Take 3 tablets for 3 days, then 2 tablets for 3 days, then 1 tablet for 3 days. 09/12/18   [name], NP  traZODone (DESYREL) 100 MG tablet Take 100 mg by mouth at bedtime.    [provider]    Review of Systems    Tachypalpitations w/ lightheadedness, exertional dyspnea, and c/p as outlined above.  All other systems reviewed and are otherwise negative except as noted above.  Physical Exam    VS:  BP 100/60 (BP Location: Left Arm, Patient  Position: Sitting, Cuff Size: Normal)   Pulse 71   Ht 5\' 1"  (1.549 m)   Wt 273 lb (123.8 kg)   LMP 06/17/2007   BMI 51.58 kg/m  , BMI Body mass index is 51.58 kg/m. GEN: Obese, in no acute distress. HEENT: normal. Neck: Supple, obese, difficult to gauge JPV, no carotid bruits, or masses. Cardiac: RRR, no murmurs, rubs, or gallops. No clubbing, cyanosis, edema.  Radials/DP/PT 2+ and equal bilaterally.  R wrist cath site w/o bleeding, bruit, or hematoma. Respiratory:  Respirations regular and unlabored, clear to auscultation bilaterally. GI: Obese, soft, nontender, nondistended, BS + x 4. MS: no deformity or atrophy. Skin: warm and dry, no rash. Neuro:  Strength and sensation are intact. Psych: Normal affect.  Accessory Clinical Findings    ECG personally reviewed by me today -regular sinus rhythm, 71- no acute changes.  Lab Results  Component Value Date   CREATININE 1.23 (H) 08/08/2018   BUN 25 (H) 08/08/2018   NA 138 08/08/2018   K 4.2 08/08/2018   CL 103 08/08/2018   CO2 29 08/08/2018    Lab Results  Component Value Date   TSH 2.92 05/17/2018    Assessment & Plan    1.  Exertional dyspnea and chest pain: Status post recent diagnostic catheterization revealing normal coronary arteries and normal LV function.  She has continued to have exertional dyspnea with intermittent chest pain.  Given findings of diagnostic catheterization, I have encouraged her to begin a regular exercise regimen in an effort to improve her cardiovascular fitness.  2.  Tachypalpitations/presyncope: Patient reports a several week history of tachypalpitations associated with lightheadedness, lasting about 10 seconds, and resolving spontaneously.  Symptoms occurred 2 days ago and then about 3 weeks ago prior to that.  I will follow-up a basic metabolic panel and TSH today.  She has already undergone echocardiogram in October, which showed normal LV function without structural abnormalities.  I will set her  up with a 30-day event monitor and plan to see her back in roughly 6 to 8 weeks.  3.  Essential hypertension: Blood pressure stable today at 100/60.  She remains on calcium channel blocker, diuretic, and ACE inhibitor therapy.  4.  Disposition: Follow-up lab work and event monitoring.  Follow-up in clinic in 6 to 8 weeks after completion of event monitoring.   Murray Hodgkins, NP 09/21/2018, 11:57 AM

## 2018-09-22 ENCOUNTER — Telehealth: Payer: Self-pay

## 2018-09-22 DIAGNOSIS — E039 Hypothyroidism, unspecified: Secondary | ICD-10-CM

## 2018-09-22 LAB — SPECIMEN STATUS REPORT

## 2018-09-22 NOTE — Telephone Encounter (Signed)
Call to patient to discuss lab work recommendations from Ignacia Bayley NP.  Pt agrees to [facility] either tomorrow or Monday.    Advised pt to call for any further questions or concerns

## 2018-09-22 NOTE — Telephone Encounter (Signed)
Patient calling to discuss recent lab testing results  ° °Please call  ° °

## 2018-09-22 NOTE — Telephone Encounter (Signed)
 "  Notes recorded by Theora Gianotti, NP on 09/22/2018 at 9:55 AM EST Renal fxn and lytes stable. TSH mildly elevated and she may require a slight increase in her levothyroxine dose. She should have a repeat TSH and Free T4 to guide that decision. It's unlikely that very mild hypothyroidism is causing her palpitations."

## 2018-09-24 LAB — BASIC METABOLIC PANEL
BUN/Creatinine Ratio: 20 (ref 9–23)
BUN: 22 mg/dL (ref 6–24)
CO2: 23 mmol/L (ref 20–29)
Calcium: 9.2 mg/dL (ref 8.7–10.2)
Chloride: 98 mmol/L (ref 96–106)
Creatinine, Ser: 1.09 mg/dL — ABNORMAL HIGH (ref 0.57–1.00)
GFR calc Af Amer: 66 mL/min/{1.73_m2} (ref >59)
GFR calc non Af Amer: 57 mL/min/{1.73_m2} — ABNORMAL LOW (ref >59)
Glucose: 78 mg/dL (ref 65–99)
Potassium: 4.5 mmol/L (ref 3.5–5.2)
Sodium: 138 mmol/L (ref 134–144)

## 2018-09-24 LAB — TSH: TSH: 6.83 u[IU]/mL — ABNORMAL HIGH (ref 0.450–4.500)

## 2018-09-24 LAB — SPECIMEN STATUS REPORT

## 2018-09-26 ENCOUNTER — Telehealth: Payer: Self-pay

## 2018-09-26 DIAGNOSIS — E039 Hypothyroidism, unspecified: Secondary | ICD-10-CM

## 2018-09-26 NOTE — Telephone Encounter (Signed)
Call to patient regarding recent lab work and recommendations,  "Notes recorded by Theora Gianotti, NP on 09/22/2018 at 9:55 AM EST Renal fxn and lytes stable. TSH mildly elevated and she may require a slight increase in her levothyroxine dose. She should have a repeat TSH and Free T4 to guide that decision. It's unlikely that very mild hypothyroidism is causing her palpitations."  Pt agreeable to [facility] mall tomorrow for repeat labs.    Pt also asking about [employer] monitor. Per RN Lattie Haw, order was placed. Telephone number given to patient for [employer], if needed she will call for order tracking.   Advised pt to call for any further questions or concerns

## 2018-09-27 ENCOUNTER — Other Ambulatory Visit
Admission: RE | Admit: 2018-09-27 | Discharge: 2018-09-27 | Disposition: A | Discharge status: Home / Self Care | Payer: No Typology Code available for payment source | Source: Ambulatory Visit | Attending: Nurse Practitioner | Admitting: Nurse Practitioner

## 2018-09-27 DIAGNOSIS — E039 Hypothyroidism, unspecified: Secondary | ICD-10-CM | POA: Insufficient documentation

## 2018-09-27 LAB — TSH: TSH: 8.889 u[IU]/mL — AB (ref 0.350–4.500)

## 2018-09-27 LAB — T4, FREE: Free T4: 0.7 ng/dL — ABNORMAL LOW (ref 0.82–1.77)

## 2018-09-28 ENCOUNTER — Ambulatory Visit (INDEPENDENT_AMBULATORY_CARE_PROVIDER_SITE_OTHER): Payer: No Typology Code available for payment source

## 2018-09-28 ENCOUNTER — Telehealth: Payer: Self-pay

## 2018-09-28 DIAGNOSIS — R002 Palpitations: Secondary | ICD-10-CM

## 2018-09-28 NOTE — Telephone Encounter (Signed)
-----   Message from Theora Gianotti, NP sent at 09/27/2018  2:44 PM EST ----- TSH mildly elevated, FT4 mildly suppressed.  Recommend she touch base w/ PCP, as she'll prob want to increase synthroid dose to 100 mcg daily.  Again, I doubt that this very mild hypothyroidism is causing her palpitations.

## 2018-09-28 NOTE — Telephone Encounter (Signed)
The patient has been notified of the result and verbalized understanding.   She agreed to call PCP to see if they would like to increase Synthroid dosing.  All questions (if any) were answered. Verlon Au, RN 09/28/2018 9:36 AM

## 2018-09-30 ENCOUNTER — Ambulatory Visit (INDEPENDENT_AMBULATORY_CARE_PROVIDER_SITE_OTHER): Payer: No Typology Code available for payment source | Admitting: Primary Care

## 2018-09-30 ENCOUNTER — Encounter: Payer: Self-pay | Admitting: Primary Care

## 2018-09-30 DIAGNOSIS — E039 Hypothyroidism, unspecified: Secondary | ICD-10-CM

## 2018-09-30 MED ORDER — LEVOTHYROXINE SODIUM 100 MCG PO TABS: ORAL_TABLET | ORAL | 0 refills | Status: DC

## 2018-09-30 NOTE — Patient Instructions (Signed)
Stop levothyroxine 88 mcg tablets.  Start levothyroxine 100 mcg tablets. Take 1 tablet by mouth every morning with water only. No food or other medications for 30 minutes.  Schedule a lab only appointment for 6 weeks.  It was a pleasure to see you today!

## 2018-09-30 NOTE — Progress Notes (Signed)
Subjective:    Patient ID: [name], female    DOB: March 13, 1961, 57 y.o.   MRN: [id number]  HPI  [name] is a 57 year old female who presents today for follow up of hypothyroidism.  She is currently managed on levothyroxine 88 mcg daily. She was following up with her cardiologist on 09/21/18 for complaints of tachypalpitations with lightheadedness. Labs were drawn including TSH which was noted to be elevated.  TSH in August 2019 of 2.92. TSH on 09/21/18 of 6.830 and TSH on 09/27/18 of 8.889. She is taking her levothyroxine in the morning with water only, no food or medications for 2 hours.   She does note palpitations, dry skin, more intolerance to the cold temperature. She denies hair loss, depression.   Review of Systems  Cardiovascular: Positive for palpitations.  Endocrine: Positive for cold intolerance.  Skin:       Dry skin   Neurological: Positive for light-headedness.       Past Medical History:  Diagnosis Date  . Acute kidney injury ([place]) 05/19/2017  . Acute myofascial strain of lumbosacral region 08/20/2016  . Allergic rhinitis   . ANEMIA, IRON DEFICIENCY, history of 04/05/2008   Qualifier: Diagnosis of  By: Philbert Riser MD, [name]    . Anginal pain ([place])    a. 08/2006 nl stress testing; b. 07/2018 Cath: Nl cors, EF 55-65%.  . Arthritis   . BPPV (benign paroxysmal positional vertigo) 01/07/2017  . CHOLECYSTECTOMY, HX OF    Annotation: 2000 Qualifier: Diagnosis of  By: Eddie Dibbles MD, Bhakti    . Chronic left maxillary sinus disease 09/22/2012  . Chronic low back pain 11/08/2015  . CKD (chronic kidney disease), stage III ([place])    a. Followed by [facility]) in [place].  Marland Kitchen Depression   . Diarrhea 05/19/2017  . Diverticulitis   . Essential hypertension 02/09/2014  . Family history of leukemia 09/22/2012   She states that both her uncle and his son was diagnosed with Leukemia. She would like to know more information about the leukemia.       Marland Kitchen GERD  (gastroesophageal reflux disease)    no meds  . Hepatic steatosis 09/22/2012   Incidental finding on abd CT in 2011   . History of blood transfusion 03/2008   Requried 2u prbc; menometrorrhagia, uterine fibroids.  . History of methicillin resistant staphylococcus aureus (MRSA) 2017   PCR+  . Hx of colonic polyp 10/26/2017  . Hypertension   . Hypertriglyceridemia    164 - 2/11  . Hypothyroidism   . Injury of left rotator cuff 12/02/2016  . Insomnia   . INSOMNIA UNSPECIFIED 05/01/2010   Qualifier: Diagnosis of  By: Shon Baton, MD, Janett Billow    . Metabolic syndrome 95/06/3266  . Nausea 05/19/2017  . Nausea vomiting and diarrhea   . 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 Obesity    250 lbs 8/11  . OBESITY 08/31/2006   Annotation: Wt. 248 lbs. 11/2004 Qualifier: Diagnosis of  By: Eddie Dibbles MD, Bhakti    . Panic disorder 11/05  . PANIC DISORDER 08/31/2006   Diagnosed 11/05, well-controlled with Paxil, patient also takes albuterol when she has a panic attack, maybe once/month   . Pectus excavatum 09/22/2012   Incidental finding on CXR in 2013.    Marland Kitchen Perirectal abscess 11/14/2011  . Prediabetes 09/22/2012  . Preventative health care 02/09/2014  . Proteinuria 07/17/2012  . Urinary incontinence without sensory awareness 12/17/2016  . Venous (peripheral) insufficiency 11/08/2015  Social History   Socioeconomic History  . Marital status: Married    Spouse name: Not on file  . Number of children: Not on file  . Years of education: Not on file  . Highest education level: Not on file  Occupational History  . Not on file  Social Needs  . Financial resource strain: Not on file  . Food insecurity:    Worry: Not on file    Inability: Not on file  . Transportation needs:    Medical: Not on file    Non-medical: Not on file  Tobacco Use  . Smoking status: Never Smoker  . Smokeless tobacco: Never Used  Substance and Sexual Activity  . Alcohol use: No    Alcohol/week: 0.0 standard drinks  . Drug use: No   . Sexual activity: Yes    Birth control/protection: None  Lifestyle  . Physical activity:    Days per week: Not on file    Minutes per session: Not on file  . Stress: Not on file  Relationships  . Social connections:    Talks on phone: Not on file    Gets together: Not on file    Attends religious service: Not on file    Active member of club or organization: Not on file    Attends meetings of clubs or organizations: Not on file    Relationship status: Not on file  . Intimate partner violence:    Fear of current or ex partner: Not on file    Emotionally abused: Not on file    Physically abused: Not on file    Forced sexual activity: Not on file  Other Topics Concern  . Not on file  Social History Narrative   Currently unemployed. Starting college in Aug for accounting.  Married, has 2 children lives with her youngest son.      Past Surgical History:  Procedure Laterality Date  . ABDOMINAL HYSTERECTOMY  04/2007   total abdominal hysterectomy ( cervix) with bilateral salpingo-oopherectomy  . APPENDECTOMY  02/23/2018   Procedure: APPENDECTOMY;  Surgeon: Robert Bellow, MD;  Location: [facility];  Service: General;;  . CHOLECYSTECTOMY  07/30/1999   Diagnosis of  By: Eddie Dibbles MD, Bhakti    . DILATION AND CURETTAGE OF UTERUS  1987  . INCISION AND DRAINAGE ABSCESS  11/02/2012   Procedure: INCISION AND DRAINAGE ABSCESS;  Surgeon: Odis Hollingshead, MD;  Location: [place];  Service: General;  Laterality: N/A;  I&D anorectal abscess  . INCISION AND DRAINAGE PERIRECTAL ABSCESS  11/14/2011   Procedure: IRRIGATION AND DEBRIDEMENT PERIRECTAL ABSCESS;  Surgeon: Zenovia Jarred, MD;  Location: [place];  Service: General;  Laterality: N/A;  . LAPAROSCOPIC SIGMOID COLECTOMY N/A 02/23/2018   Procedure: LAPAROSCOPIC ASSISTED SIGMOID COLECTOMY;  Surgeon: Robert Bellow, MD;  Location: [facility];  Service: General;  Laterality: N/A;  . LEFT HEART CATH AND CORONARY ANGIOGRAPHY Left 08/11/2018    Procedure: LEFT HEART CATH AND CORONARY ANGIOGRAPHY;  Surgeon: Wellington Hampshire, MD;  Location: [place] [facility];  Service: Cardiovascular;  Laterality: Left;  . NERVE, TENDON AND ARTERY REPAIR Left 06/11/2013   Procedure: EXPLORATION AND REPAIR LEFT FOREARM;  Surgeon: Schuyler Amor, MD;  Location: [place];  Service: Orthopedics;  Laterality: Left;  . SHOULDER ARTHROSCOPY WITH ROTATOR CUFF REPAIR AND SUBACROMIAL DECOMPRESSION Left 03/06/2016   Procedure: LEFT SHOULDER ARTHROSCOPY WITH ROTATOR CUFF REPAIR AND SUBACROMIAL DECOMPRESSION, DISTAL CLAVICLE EXCISION;  Surgeon: Leandrew Koyanagi, MD;  Location: [place];  Service: Orthopedics;  Laterality: Left;    Family History  Problem Relation Age of Onset  . Heart attack Mother        at the age of 11's  . Lung cancer Mother        metestatic, + smoker  . Aneurysm Mother   . Hyperlipidemia Mother   . Hypertension Mother   . Hypertension Father   . Heart attack Maternal Grandmother        at the age of 56's  . Breast cancer Maternal Aunt   . Colon cancer Neg Hx   . Stomach cancer Neg Hx   . Esophageal cancer Neg Hx   . Rectal cancer Neg Hx     Allergies  Allergen Reactions  . Cephalexin Itching  . Doxycycline Nausea And Vomiting  . Dilaudid [Hydromorphone Hcl] Other (See Comments)    Panic attack  . Sulfur Itching and Rash    Current Outpatient Medications on File Prior to Visit  Medication Sig Dispense Refill  . acetaminophen (TYLENOL) 500 MG tablet Take 1,000 mg by mouth every 8 (eight) hours as needed for mild pain.    Marland Kitchen amLODipine (NORVASC) 10 MG tablet Take 1 tablet by mouth once daily for blood pressure. (Patient taking differently: Take 10 mg by mouth at bedtime. Take 1 tablet by mouth once daily for blood pressure.) 90 tablet 3  . aspirin 81 MG chewable tablet Chew 81 mg by mouth daily.     Marland Kitchen buPROPion (WELLBUTRIN XL) 300 MG 24 hr tablet Take 300 mg by mouth at bedtime.     . hydrochlorothiazide (HYDRODIURIL) 25 MG tablet  Take 1 tablet (25 mg total) by mouth daily. 90 tablet 3  . levothyroxine (SYNTHROID) 88 MCG tablet Take 1 tablet by mouth every morning on an empty stomach with a full glass of water. (Patient taking differently: Take 88 mcg by mouth at bedtime. Take 1 tablet by mouth every morning on an empty stomach with a full glass of water.) 90 tablet 2  . lisinopril (PRINIVIL,ZESTRIL) 30 MG tablet Take 1 tablet (30 mg total) by mouth daily. 90 tablet 0  . nitroGLYCERIN (NITROSTAT) 0.4 MG SL tablet Place 1 tablet (0.4 mg total) under the tongue every 5 (five) minutes as needed for chest pain. 50 tablet 0  . PARoxetine (PAXIL) 20 MG tablet Take 1.5 tablets (30 mg total) by mouth daily. (Patient taking differently: Take 30 mg by mouth at bedtime. ) 135 tablet 2  . traZODone (DESYREL) 100 MG tablet Take 100 mg by mouth at bedtime.     No current facility-administered medications on file prior to visit.     BP 108/72   Pulse 64   Temp 98.2 F (36.8 C) (Oral)   Ht 5\' 1"  (1.549 m)   Wt 277 lb 4 oz (125.8 kg)   LMP 06/17/2007   SpO2 98%   BMI 52.39 kg/m    Objective:   Physical Exam  Constitutional: She appears well-nourished.  Neck: Neck supple. No thyromegaly present.  Cardiovascular: Normal rate and regular rhythm.  Respiratory: Effort normal and breath sounds normal.  Skin: Skin is warm and dry.           Assessment & Plan:

## 2018-09-30 NOTE — Assessment & Plan Note (Signed)
Normal TSH in August 2019 on levothyroxine 88 mcg, now with gradual increase with level of 8. She is taking levothyroxine appropriately.  Given symptoms of palpitations, dry skin, cold intolerance will increase dose of levothyroxine to 100 mcg. Repeat TSH in 6 weeks.

## 2018-10-07 ENCOUNTER — Ambulatory Visit (INDEPENDENT_AMBULATORY_CARE_PROVIDER_SITE_OTHER): Payer: Self-pay | Admitting: Orthopaedic Surgery

## 2018-10-18 ENCOUNTER — Ambulatory Visit (INDEPENDENT_AMBULATORY_CARE_PROVIDER_SITE_OTHER): Payer: Self-pay | Admitting: Orthopaedic Surgery

## 2018-10-28 ENCOUNTER — Ambulatory Visit (INDEPENDENT_AMBULATORY_CARE_PROVIDER_SITE_OTHER): Payer: Self-pay | Admitting: Orthopaedic Surgery

## 2018-10-28 ENCOUNTER — Ambulatory Visit (INDEPENDENT_AMBULATORY_CARE_PROVIDER_SITE_OTHER): Payer: Self-pay

## 2018-10-28 ENCOUNTER — Encounter (INDEPENDENT_AMBULATORY_CARE_PROVIDER_SITE_OTHER): Payer: Self-pay | Admitting: Orthopaedic Surgery

## 2018-10-28 VITALS — BP 122/70 | HR 73 | Ht 61.0 in | Wt 270.0 lb

## 2018-10-28 DIAGNOSIS — M5416 Radiculopathy, lumbar region: Secondary | ICD-10-CM

## 2018-10-28 DIAGNOSIS — M48062 Spinal stenosis, lumbar region with neurogenic claudication: Secondary | ICD-10-CM

## 2018-10-28 DIAGNOSIS — G8929 Other chronic pain: Secondary | ICD-10-CM

## 2018-10-28 DIAGNOSIS — M545 Low back pain: Secondary | ICD-10-CM

## 2018-10-28 DIAGNOSIS — G9519 Other vascular myelopathies: Secondary | ICD-10-CM

## 2018-10-28 MED ORDER — DIAZEPAM 5 MG PO TABS: ORAL_TABLET | ORAL | 0 refills | Status: AC

## 2018-10-28 NOTE — Progress Notes (Signed)
Office Visit Note   Patient: [name]           Date of Birth: 1961/01/28           MRN: [id number] Visit Date: 10/28/2018              Requested by: [name], NP [place], [place] [address] PCP: [name], NP   Assessment & Plan: Visit Diagnoses:  1. Chronic bilateral low back pain, unspecified whether sciatica present   2. Radiculopathy, lumbar region   3. Neurogenic claudication     Plan: With patient's ongoing and progressively worsening symptoms that have failed conservative treatment I recommend repeating lumbar MRI and comparing to previous study done June 2018.  Follow with Dr. Lorin Mercy after completion to discuss results and further treatment options.  With patient's history of gout and question of polyarthralgias I may consider getting an arthritis panel next office visit.  Follow-Up Instructions: Return in about 3 weeks (around 11/18/2018) for For review of lumbar MRI with Dr. Lorin Mercy.   Orders:  Orders Placed This Encounter  Procedures  . XR Lumbar Spine 2-3 Views  . MR Lumbar Spine w/o contrast   Meds ordered this encounter  Medications  . diazepam (VALIUM) 5 MG tablet    Sig: Take 1 tablet 1 hour prior to MRI and a second immediately before if needed    Dispense:  2 tablet    Refill:  0      Procedures: No procedures performed   Clinical Data: No additional findings.   Subjective: Chief Complaint  Patient presents with  . Lower Back - Pain    HPI 58 year old white female is referred to our office for evaluation of chronic low back pain.  Patient states that she is at off and on back pain for a couple years.  No specific injury that she can recall.  Pain has been worse the last few months.  Patient reports that back pain is worse than the leg pain that she has been having.  The last 6 to 8 months she is also complaining of neurogenic claudication symptoms.  Walking distances have greatly decreased.  Symptoms also  aggravated with standing up for prolonged periods.  While in the grocery store she does have to lean over the cart in order to try to relieve some of her back pain and leg problems.  Primary care physician treated with a prednisone taper last month and this did give some improvement.  Had lumbar MRI scan March 26, 2017 and report showed mild bilateral neuroforaminal narrowing at L4-5 and L5-S1.  Moderate L4-5 facet arthrosis which may be a source of low back pain.  Patient states that she did not have trial of ESI's.  Primary care physician has been conservatively managing.  Patient reports problems with off and on bilateral knee pain.  Does have a history of gout with uric acid being 9.12 August 2017.  She is not on allopurinol.  She is not sure of a family or personal history of lupus or rheumatoid arthritis. Review of Systems No current cardiac pulmonary GI GU issues  Objective: Vital Signs: BP 122/70   Pulse 73   Ht 5\' 1"  (1.549 m)   Wt 270 lb (122.5 kg)   LMP 06/17/2007   BMI 51.02 kg/m   Physical Exam Constitutional:      Appearance: Normal appearance.  HENT:     Head: Normocephalic and atraumatic.  Eyes:  Extraocular Movements: Extraocular movements intact.     Pupils: Pupils are equal, round, and reactive to light.  Pulmonary:     Effort: Pulmonary effort is normal.     Breath sounds: Normal breath sounds.  Musculoskeletal:     Comments: Patient has pain with lumbar extension and lumbar flexion.  Bilateral lumbar paraspinal tenderness.  She is also tender over the bilateral SI joints and bilateral hip greater trochanter bursa.  Negative logroll.  Negative straight leg raise.  No focal motor deficits.  Right knee she is somewhat tender at the medial lateral joint line.  Ligaments feel stable.  Bilateral calves nontender.  Skin:    General: Skin is warm and dry.  Neurological:     Mental Status: She is alert.  Psychiatric:        Mood and Affect: Mood normal.     Ortho  Exam  Specialty Comments:  No specialty comments available.  Imaging: Xr Lumbar Spine 2-3 Views  Result Date: 10/28/2018 Lumbar x-rays show multilevel spondylosis.  No disc space collapse.  Question small spur off the posterior L3 vertebral body versus disc calcification.  No acute finding.  L4-5 L5-S1 facet arthropathy    PMFS History: Patient Active Problem List   Diagnosis Date Noted  . Unstable angina ([place])   . Chronic knee pain 07/19/2018  . 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 Hx of colonic polyp 10/26/2017  . CKD (chronic kidney disease) stage 3, GFR 30-59 ml/min ([facility]) 05/25/2017  . Urinary incontinence without sensory awareness 12/17/2016  . Injury of left rotator cuff 12/02/2016  . Venous (peripheral) insufficiency 11/08/2015  . Chronic low back pain 11/08/2015  . HTN (hypertension) 02/09/2014  . Preventative health care 02/09/2014  . 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 Pectus excavatum 09/22/2012  . Hepatic steatosis 09/22/2012  . Chronic left maxillary sinus disease 09/22/2012  . Prediabetes 09/22/2012  . Family history of leukemia 09/22/2012  . Proteinuria 07/17/2012  . INSOMNIA UNSPECIFIED 05/01/2010  . Hypothyroidism 04/05/2008  . ANEMIA, IRON DEFICIENCY, history of 04/05/2008  . Hypertriglyceridemia 08/31/2006  . OBESITY 08/31/2006  . PANIC DISORDER 08/31/2006  . CHOLECYSTECTOMY, HX OF 08/31/2006   Past Medical History:  Diagnosis Date  . Acute kidney injury ([place]) 05/19/2017  . Acute myofascial strain of lumbosacral region 08/20/2016  . Allergic rhinitis   . ANEMIA, IRON DEFICIENCY, history of 04/05/2008   Qualifier: Diagnosis of  By: Philbert Riser MD, Manrique    . Anginal pain ([place])    a. 08/2006 nl stress testing; b. 07/2018 Cath: Nl cors, EF 55-65%.  . Arthritis   . BPPV (benign paroxysmal positional vertigo) 01/07/2017  . CHOLECYSTECTOMY, HX OF    Annotation: 2000 Qualifier: Diagnosis of  By: Eddie Dibbles MD, Bhakti    . Chronic left maxillary sinus disease  09/22/2012  . Chronic low back pain 11/08/2015  . CKD (chronic kidney disease), stage III ([place])    a. Followed by [facility]) in [place].  Marland Kitchen Depression   . Diarrhea 05/19/2017  . Diverticulitis   . Essential hypertension 02/09/2014  . Family history of leukemia 09/22/2012   She states that both her uncle and his son was diagnosed with Leukemia. She would like to know more information about the leukemia.       Marland Kitchen GERD (gastroesophageal reflux disease)    no meds  . Hepatic steatosis 09/22/2012   Incidental finding on abd CT in 2011   . History of blood transfusion 03/2008   Requried 2u prbc;  menometrorrhagia, uterine fibroids.  . History of methicillin resistant staphylococcus aureus (MRSA) 2017   PCR+  . Hx of colonic polyp 10/26/2017  . Hypertension   . Hypertriglyceridemia    164 - 2/11  . Hypothyroidism   . Injury of left rotator cuff 12/02/2016  . Insomnia   . INSOMNIA UNSPECIFIED 05/01/2010   Qualifier: Diagnosis of  By: Shon Baton, MD, Janett Billow    . Metabolic syndrome 09/81/1914  . Nausea 05/19/2017  . Nausea vomiting and diarrhea   . 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 Obesity    250 lbs 8/11  . OBESITY 08/31/2006   Annotation: Wt. 248 lbs. 11/2004 Qualifier: Diagnosis of  By: Eddie Dibbles MD, Bhakti    . Panic disorder 11/05  . PANIC DISORDER 08/31/2006   Diagnosed 11/05, well-controlled with Paxil, patient also takes albuterol when she has a panic attack, maybe once/month   . Pectus excavatum 09/22/2012   Incidental finding on CXR in 2013.    Marland Kitchen Perirectal abscess 11/14/2011  . Prediabetes 09/22/2012  . Preventative health care 02/09/2014  . Proteinuria 07/17/2012  . Urinary incontinence without sensory awareness 12/17/2016  . Venous (peripheral) insufficiency 11/08/2015    Family History  Problem Relation Age of Onset  . Heart attack Mother        at the age of 77's  . Lung cancer Mother        metestatic, + smoker  . Aneurysm Mother   . Hyperlipidemia Mother   .  Hypertension Mother   . Hypertension Father   . Heart attack Maternal Grandmother        at the age of 66's  . Breast cancer Maternal Aunt   . Colon cancer Neg Hx   . Stomach cancer Neg Hx   . Esophageal cancer Neg Hx   . Rectal cancer Neg Hx     Past Surgical History:  Procedure Laterality Date  . ABDOMINAL HYSTERECTOMY  04/2007   total abdominal hysterectomy ( cervix) with bilateral salpingo-oopherectomy  . APPENDECTOMY  02/23/2018   Procedure: APPENDECTOMY;  Surgeon: Robert Bellow, MD;  Location: [facility];  Service: General;;  . CHOLECYSTECTOMY  07/30/1999   Diagnosis of  By: Eddie Dibbles MD, Bhakti    . DILATION AND CURETTAGE OF UTERUS  1987  . INCISION AND DRAINAGE ABSCESS  11/02/2012   Procedure: INCISION AND DRAINAGE ABSCESS;  Surgeon: Odis Hollingshead, MD;  Location: [facility];  Service: General;  Laterality: N/A;  I&D anorectal abscess  . INCISION AND DRAINAGE PERIRECTAL ABSCESS  11/14/2011   Procedure: IRRIGATION AND DEBRIDEMENT PERIRECTAL ABSCESS;  Surgeon: Zenovia Jarred, MD;  Location: [facility];  Service: General;  Laterality: N/A;  . LAPAROSCOPIC SIGMOID COLECTOMY N/A 02/23/2018   Procedure: LAPAROSCOPIC ASSISTED SIGMOID COLECTOMY;  Surgeon: Robert Bellow, MD;  Location: [facility];  Service: General;  Laterality: N/A;  . LEFT HEART CATH AND CORONARY ANGIOGRAPHY Left 08/11/2018   Procedure: LEFT HEART CATH AND CORONARY ANGIOGRAPHY;  Surgeon: Wellington Hampshire, MD;  Location: [facility];  Service: Cardiovascular;  Laterality: Left;  . NERVE, TENDON AND ARTERY REPAIR Left 06/11/2013   Procedure: EXPLORATION AND REPAIR LEFT FOREARM;  Surgeon: Schuyler Amor, MD;  Location: [facility];  Service: Orthopedics;  Laterality: Left;  . SHOULDER ARTHROSCOPY WITH ROTATOR CUFF REPAIR AND SUBACROMIAL DECOMPRESSION Left 03/06/2016   Procedure: LEFT SHOULDER ARTHROSCOPY WITH ROTATOR CUFF REPAIR AND SUBACROMIAL DECOMPRESSION, DISTAL CLAVICLE EXCISION;  Surgeon: Leandrew Koyanagi, MD;  Location: [facility];  Service: Orthopedics;  Laterality: Left;   Social History   Occupational History  . Not on file  Tobacco Use  . Smoking status: Never Smoker  . Smokeless tobacco: Never Used  Substance and Sexual Activity  . Alcohol use: No    Alcohol/week: 0.0 standard drinks  . Drug use: No  . Sexual activity: Yes    Birth control/protection: None

## 2018-11-11 ENCOUNTER — Other Ambulatory Visit: Payer: No Typology Code available for payment source

## 2018-11-11 ENCOUNTER — Other Ambulatory Visit (INDEPENDENT_AMBULATORY_CARE_PROVIDER_SITE_OTHER): Payer: No Typology Code available for payment source

## 2018-11-11 DIAGNOSIS — E039 Hypothyroidism, unspecified: Secondary | ICD-10-CM

## 2018-11-11 LAB — TSH: TSH: 4.11 u[IU]/mL (ref 0.35–4.50)

## 2018-11-15 ENCOUNTER — Telehealth (INDEPENDENT_AMBULATORY_CARE_PROVIDER_SITE_OTHER): Payer: Self-pay | Admitting: Orthopaedic Surgery

## 2018-11-15 ENCOUNTER — Ambulatory Visit
Admission: RE | Admit: 2018-11-15 | Discharge: 2018-11-15 | Disposition: A | Discharge status: Home / Self Care | Payer: No Typology Code available for payment source | Source: Ambulatory Visit | Attending: Surgery | Admitting: Surgery

## 2018-11-15 DIAGNOSIS — M545 Low back pain, unspecified: Secondary | ICD-10-CM

## 2018-11-15 DIAGNOSIS — G8929 Other chronic pain: Secondary | ICD-10-CM

## 2018-11-15 DIAGNOSIS — G9519 Other vascular myelopathies: Secondary | ICD-10-CM

## 2018-11-15 DIAGNOSIS — M5416 Radiculopathy, lumbar region: Secondary | ICD-10-CM

## 2018-11-15 DIAGNOSIS — M48062 Spinal stenosis, lumbar region with neurogenic claudication: Secondary | ICD-10-CM

## 2018-11-15 NOTE — Telephone Encounter (Signed)
Called patient left message on voicemail to return call    Patient want to reschedule her appointment  [phone]

## 2018-11-18 ENCOUNTER — Ambulatory Visit (INDEPENDENT_AMBULATORY_CARE_PROVIDER_SITE_OTHER): Payer: Self-pay | Admitting: Orthopaedic Surgery

## 2018-11-18 ENCOUNTER — Telehealth: Payer: Self-pay | Admitting: *Deleted

## 2018-11-18 NOTE — Telephone Encounter (Signed)
-----   Message from Theora Gianotti, NP sent at 11/17/2018  6:16 PM EST ----- No significant arrhythmias.  Reassuring.

## 2018-11-18 NOTE — Telephone Encounter (Signed)
Left voicemail message to call back for monitor results.  

## 2018-11-18 NOTE — Telephone Encounter (Signed)
Patient reviewed results through mychart. Will close encounter.

## 2018-11-23 ENCOUNTER — Ambulatory Visit: Payer: No Typology Code available for payment source | Admitting: Nurse Practitioner

## 2018-11-23 NOTE — Progress Notes (Deleted)
Office Visit    Patient Name: [name] Date of Encounter: 11/23/2018  Primary Care Provider:  [name], NP Primary Cardiologist:  Kathlyn Sacramento, MD  Chief Complaint    ***  Past Medical History    Past Medical History:  Diagnosis Date  . Acute kidney injury ([place]) 05/19/2017  . Acute myofascial strain of lumbosacral region 08/20/2016  . Allergic rhinitis   . ANEMIA, IRON DEFICIENCY, history of 04/05/2008   Qualifier: Diagnosis of  By: Philbert Riser MD, Manrique    . Anginal pain ([place])    a. 08/2006 nl stress testing; b. 07/2018 Cath: Nl cors, EF 55-65%.  . Arthritis   . BPPV (benign paroxysmal positional vertigo) 01/07/2017  . CHOLECYSTECTOMY, HX OF    Annotation: 2000 Qualifier: Diagnosis of  By: Eddie Dibbles MD, Bhakti    . Chronic left maxillary sinus disease 09/22/2012  . Chronic low back pain 11/08/2015  . CKD (chronic kidney disease), stage III ([place])    a. Followed by [facility]) in [place].  Marland Kitchen Depression   . Diarrhea 05/19/2017  . Diverticulitis   . Essential hypertension 02/09/2014  . Family history of leukemia 09/22/2012   She states that both her uncle and his son was diagnosed with Leukemia. She would like to know more information about the leukemia.       Marland Kitchen GERD (gastroesophageal reflux disease)    no meds  . Hepatic steatosis 09/22/2012   Incidental finding on abd CT in 2011   . History of blood transfusion 03/2008   Requried 2u prbc; menometrorrhagia, uterine fibroids.  . History of methicillin resistant staphylococcus aureus (MRSA) 2017   PCR+  . Hx of colonic polyp 10/26/2017  . Hypertension   . Hypertriglyceridemia    164 - 2/11  . Hypothyroidism   . Injury of left rotator cuff 12/02/2016  . Insomnia   . INSOMNIA UNSPECIFIED 05/01/2010   Qualifier: Diagnosis of  By: Shon Baton, MD, Janett Billow    . Metabolic syndrome 78/93/8101  . Nausea 05/19/2017  . Nausea vomiting and diarrhea   . 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 Obesity    250 lbs  8/11  . OBESITY 08/31/2006   Annotation: Wt. 248 lbs. 11/2004 Qualifier: Diagnosis of  By: Eddie Dibbles MD, Bhakti    . Panic disorder 11/05  . PANIC DISORDER 08/31/2006   Diagnosed 11/05, well-controlled with Paxil, patient also takes albuterol when she has a panic attack, maybe once/month   . Pectus excavatum 09/22/2012   Incidental finding on CXR in 2013.    Marland Kitchen Perirectal abscess 11/14/2011  . Prediabetes 09/22/2012  . Preventative health care 02/09/2014  . Proteinuria 07/17/2012  . Urinary incontinence without sensory awareness 12/17/2016  . Venous (peripheral) insufficiency 11/08/2015   Past Surgical History:  Procedure Laterality Date  . ABDOMINAL HYSTERECTOMY  04/2007   total abdominal hysterectomy ( cervix) with bilateral salpingo-oopherectomy  . APPENDECTOMY  02/23/2018   Procedure: APPENDECTOMY;  Surgeon: Robert Bellow, MD;  Location: [facility];  Service: General;;  . CHOLECYSTECTOMY  07/30/1999   Diagnosis of  By: Eddie Dibbles MD, Bhakti    . DILATION AND CURETTAGE OF UTERUS  1987  . INCISION AND DRAINAGE ABSCESS  11/02/2012   Procedure: INCISION AND DRAINAGE ABSCESS;  Surgeon: Odis Hollingshead, MD;  Location: [facility];  Service: General;  Laterality: N/A;  I&D anorectal abscess  . INCISION AND DRAINAGE PERIRECTAL ABSCESS  11/14/2011   Procedure: IRRIGATION AND DEBRIDEMENT PERIRECTAL ABSCESS;  Surgeon: Merri Ray  Grandville Silos, MD;  Location: [facility];  Service: General;  Laterality: N/A;  . LAPAROSCOPIC SIGMOID COLECTOMY N/A 02/23/2018   Procedure: LAPAROSCOPIC ASSISTED SIGMOID COLECTOMY;  Surgeon: Robert Bellow, MD;  Location: [facility];  Service: General;  Laterality: N/A;  . LEFT HEART CATH AND CORONARY ANGIOGRAPHY Left 08/11/2018   Procedure: LEFT HEART CATH AND CORONARY ANGIOGRAPHY;  Surgeon: Wellington Hampshire, MD;  Location: [facility];  Service: Cardiovascular;  Laterality: Left;  . NERVE, TENDON AND ARTERY REPAIR Left 06/11/2013   Procedure: EXPLORATION AND REPAIR LEFT FOREARM;  Surgeon:  Schuyler Amor, MD;  Location: [facility];  Service: Orthopedics;  Laterality: Left;  . SHOULDER ARTHROSCOPY WITH ROTATOR CUFF REPAIR AND SUBACROMIAL DECOMPRESSION Left 03/06/2016   Procedure: LEFT SHOULDER ARTHROSCOPY WITH ROTATOR CUFF REPAIR AND SUBACROMIAL DECOMPRESSION, DISTAL CLAVICLE EXCISION;  Surgeon: Leandrew Koyanagi, MD;  Location: [facility];  Service: Orthopedics;  Laterality: Left;    Allergies  Allergies  Allergen Reactions  . Cephalexin Itching  . Doxycycline Nausea And Vomiting  . Dilaudid [Hydromorphone Hcl] Other (See Comments)    Panic attack  . Sulfur Itching and Rash    History of Present Illness    ***  Home Medications    Prior to Admission medications   Medication Sig Start Date End Date Taking? Authorizing Provider  acetaminophen (TYLENOL) 500 MG tablet Take 1,000 mg by mouth every 8 (eight) hours as needed for mild pain.    [provider]  amLODipine (NORVASC) 10 MG tablet Take 1 tablet by mouth once daily for blood pressure. Patient taking differently: Take 10 mg by mouth at bedtime. Take 1 tablet by mouth once daily for blood pressure. 07/20/17   [name], NP  aspirin 81 MG chewable tablet Chew 81 mg by mouth daily.     [provider]  buPROPion (WELLBUTRIN XL) 300 MG 24 hr tablet Take 300 mg by mouth at bedtime.     [provider]  diazepam (VALIUM) 5 MG tablet Take 1 tablet 1 hour prior to MRI and a second immediately before if needed 10/28/18   Lanae Crumbly, PA-C  hydrochlorothiazide (HYDRODIURIL) 25 MG tablet Take 1 tablet (25 mg total) by mouth daily. 07/20/17   [name], NP  levothyroxine (SYNTHROID, LEVOTHROID) 100 MCG tablet Take 1 tablet by mouth every morning with water only. No food or medications for 30 minutes. 09/30/18   [name], NP  lisinopril (PRINIVIL,ZESTRIL) 30 MG tablet Take 1 tablet (30 mg total) by mouth daily. 05/02/18   [name], NP  nitroGLYCERIN (NITROSTAT) 0.4 MG SL tablet  Place 1 tablet (0.4 mg total) under the tongue every 5 (five) minutes as needed for chest pain. 08/08/18   Elby Beck, FNP  PARoxetine (PAXIL) 20 MG tablet Take 1.5 tablets (30 mg total) by mouth daily. Patient taking differently: Take 30 mg by mouth at bedtime.  07/20/17   [name], NP  traZODone (DESYREL) 100 MG tablet Take 100 mg by mouth at bedtime.    [provider]    Review of Systems    ***.  All other systems reviewed and are otherwise negative except as noted above.  Physical Exam    VS:  LMP 06/17/2007  , BMI There is no height or weight on file to calculate BMI. GEN: Well nourished, well developed, in no acute distress. HEENT: normal. Neck: Supple, no JVD, carotid bruits, or masses. Cardiac: RRR, no murmurs, rubs, or gallops.  No clubbing, cyanosis, edema.  Radials/DP/PT 2+ and equal bilaterally.  Respiratory:  Respirations regular and unlabored, clear to auscultation bilaterally. GI: Soft, nontender, nondistended, BS + x 4. MS: no deformity or atrophy. Skin: warm and dry, no rash. Neuro:  Strength and sensation are intact. Psych: Normal affect.  Accessory Clinical Findings    ECG personally reviewed by me today - *** - no acute changes.  Assessment & Plan    1.  ***   Murray Hodgkins, NP 11/23/2018, 9:45 AM

## 2018-11-24 ENCOUNTER — Encounter: Payer: Self-pay | Admitting: Nurse Practitioner

## 2018-12-02 ENCOUNTER — Ambulatory Visit (INDEPENDENT_AMBULATORY_CARE_PROVIDER_SITE_OTHER): Payer: Self-pay | Admitting: Orthopaedic Surgery

## 2018-12-02 ENCOUNTER — Encounter (INDEPENDENT_AMBULATORY_CARE_PROVIDER_SITE_OTHER): Payer: Self-pay | Admitting: Orthopaedic Surgery

## 2018-12-02 VITALS — BP 141/85 | HR 73 | Ht 61.0 in | Wt 270.0 lb

## 2018-12-02 DIAGNOSIS — G8929 Other chronic pain: Secondary | ICD-10-CM

## 2018-12-02 DIAGNOSIS — M545 Low back pain, unspecified: Secondary | ICD-10-CM

## 2018-12-02 NOTE — Progress Notes (Signed)
Office Visit Note   Patient: [name]           Date of Birth: 04/16/1961           MRN: [id number] Visit Date: 12/02/2018              Requested by: [name], NP [place], [place] [address] PCP: [name], NP   Assessment & Plan: Visit Diagnoses:  1. Chronic bilateral low back pain, unspecified whether sciatica present   The patient meets the AMA guidelines for Morbid (severe) obesity with a BMI > 40.0 and I have recommended weight loss.   Plan: We will set up for single epidural on the right side at L5-S1 where she has lateral protrusion.  We discussed weight loss possibly going to the bariatric class so she can get some more information.  We discussed weight loss with significantly improved with her back symptoms as well as overall health.  Return PRN.  Copy of MRI report given to patient we reviewed the MRI with patient and her husband today.  Follow-Up Instructions: No follow-ups on file.   Orders:  Orders Placed This Encounter  Procedures  . Ambulatory referral to Physical Medicine Rehab   No orders of the defined types were placed in this encounter.     Procedures: No procedures performed   Clinical Data: No additional findings.   Subjective: Chief Complaint  Patient presents with  . Lower Back - Pain, Follow-up    MRI Lumbar Review    HPI 58 year old female with chronic back pain present for years.  She has more pain on the right than the left that radiates down her leg to her foot.  She rarely has any symptoms on the left leg.  She gets some relief with supine position.  Increased pain with bending and twisting.  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  Review of Systems reviewed updated unchanged from last office visit of note is morbid obesity BMI 51.   Objective: Vital Signs: BP (!) 141/85   Pulse 73   Ht 5\' 1"  (1.549 m)   Wt 270 lb (122.5 kg)   LMP 06/17/2007   BMI 51.02 kg/m    Physical Exam Constitutional:      Appearance: She is well-developed.  HENT:     Head: Normocephalic.     Right Ear: External ear normal.     Left Ear: External ear normal.  Eyes:     Pupils: Pupils are equal, round, and reactive to light.  Neck:     Thyroid: No thyromegaly.     Trachea: No tracheal deviation.  Cardiovascular:     Rate and Rhythm: Normal rate.  Pulmonary:     Effort: Pulmonary effort is normal.  Abdominal:     Palpations: Abdomen is soft.     Comments: Increased BMI  Skin:    General: Skin is warm and dry.  Neurological:     Mental Status: She is alert and oriented to person, place, and time.  Psychiatric:        Behavior: Behavior normal.     Ortho Exam patient has not pain with both flexion and extension lumbar spine.  Negative logroll of the hips.  Normal heel toe gait.  Anterior tib gastrocsoleus is intact. Specialty Comments:  No specialty comments available.  Imaging: CLINICAL DATA:  Low back pain and leg weakness over the last 2 years.  EXAM: MRI  LUMBAR SPINE WITHOUT CONTRAST  TECHNIQUE: Multiplanar, multisequence MR imaging of the lumbar spine was performed. No intravenous contrast was administered.  COMPARISON:  Radiography 10/28/2018.  MRI 03/26/2017.  FINDINGS: Segmentation:  5 lumbar type vertebral bodies.  Alignment:  Normal  Vertebrae:  Normal  Conus medullaris and cauda equina: Conus extends to the L1-2 level. Conus and cauda equina appear normal.  Paraspinal and other soft tissues: Normal  Disc levels:  T11-12: Facet and ligamentous prominence encroach mildly upon the dorsal subarachnoid space but do not appear to cause compressive stenosis.  T12-L1 and L1-2: Normal.  L2-3: Small left foraminal disc bulge adjacent to the left L2 nerve. No frank nerve compression however.  L3-4: Left foraminal to extraforaminal disc protrusion that could focally compress the left L3 nerve. Mild facet degeneration  and hypertrophy at this level.  L4-5: Minimal desiccation and bulging of the disc. Mild bilateral facet osteoarthritis. No apparent compressive stenosis. Findings could contribute to back pain or referred facet syndrome pain.  L5-S1: Bulging of the disc more prominent towards the right. Mild facet and ligamentous hypertrophy. Bulging disc material in the foraminal region is adjacent to the exiting L5 nerve and could irritate that structure. Distinct nerve compression is not demonstrated. The facet osteoarthritis could contribute to low back pain. Right S1 and S2 root sleeves appear to be conjoined, which can in some patients be symptomatic.  IMPRESSION: L2-3: Left foraminal to extraforaminal disc bulge adjacent to the left L2 nerve. Some potential this could cause L2 nerve irritation.  L3-4: Left foraminal to extraforaminal disc protrusion could compress the left L3 nerve.  L4-5: Mild disc bulge. Bilateral facet osteoarthritis. No apparent compressive stenosis. Findings at this level could be associated with back pain or facet syndrome pain.  L5-S1: Bulging of the disc more towards the right. Disc material is adjacent to the right L5 nerve and could irritate that structure, though nerve compression is not demonstrated. The facet arthritis could be painful.  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 Usually this is insignificant, but in some patients can be associated with pain syndromes.   Electronically Signed   By: Nelson Chimes M.D.   On: 11/16/2018 08:48   PMFS History: Patient Active Problem List   Diagnosis Date Noted  . Unstable angina ([place])   . Chronic knee pain 07/19/2018  . 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 Hx of colonic polyp 10/26/2017  . CKD (chronic kidney disease) stage 3, GFR 30-59 ml/min ([facility]) 05/25/2017  . Urinary incontinence without sensory awareness 12/17/2016  . Injury of left rotator cuff 12/02/2016  . Venous (peripheral) insufficiency  11/08/2015  . Chronic low back pain 11/08/2015  . HTN (hypertension) 02/09/2014  . Preventative health care 02/09/2014  . 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 Pectus excavatum 09/22/2012  . Hepatic steatosis 09/22/2012  . Chronic left maxillary sinus disease 09/22/2012  . Prediabetes 09/22/2012  . Family history of leukemia 09/22/2012  . Proteinuria 07/17/2012  . INSOMNIA UNSPECIFIED 05/01/2010  . Hypothyroidism 04/05/2008  . ANEMIA, IRON DEFICIENCY, history of 04/05/2008  . Hypertriglyceridemia 08/31/2006  . OBESITY 08/31/2006  . PANIC DISORDER 08/31/2006  . CHOLECYSTECTOMY, HX OF 08/31/2006   Past Medical History:  Diagnosis Date  . Acute kidney injury ([facility]) 05/19/2017  . Acute myofascial strain of lumbosacral region 08/20/2016  . Allergic rhinitis   . ANEMIA, IRON DEFICIENCY, history of 04/05/2008   Qualifier: Diagnosis of  By: Philbert Riser MD, Man[place]    . Anginal pain ([facility])    a.  08/2006 nl stress testing; b. 07/2018 Cath: Nl cors, EF 55-65%.  . Arthritis   . BPPV (benign paroxysmal positional vertigo) 01/07/2017  . CHOLECYSTECTOMY, HX OF    Annotation: 2000 Qualifier: Diagnosis of  By: Eddie Dibbles MD, Bhakti    . Chronic left maxillary sinus disease 09/22/2012  . Chronic low back pain 11/08/2015  . CKD (chronic kidney disease), stage III ([facility])    a. Followed by [facility] in [place].  Marland Kitchen Depression   . Diarrhea 05/19/2017  . Diverticulitis   . Essential hypertension 02/09/2014  . Family history of leukemia 09/22/2012   She states that both her uncle and his son was diagnosed with Leukemia. She would like to know more information about the leukemia.       Marland Kitchen GERD (gastroesophageal reflux disease)    no meds  . Hepatic steatosis 09/22/2012   Incidental finding on abd CT in 2011   . History of blood transfusion 03/2008   Requried 2u prbc; menometrorrhagia, uterine fibroids.  . History of methicillin resistant staphylococcus aureus (MRSA) 2017   PCR+  . Hx of colonic  polyp 10/26/2017  . Hypertension   . Hypertriglyceridemia    164 - 2/11  . Hypothyroidism   . Injury of left rotator cuff 12/02/2016  . Insomnia   . INSOMNIA UNSPECIFIED 05/01/2010   Qualifier: Diagnosis of  By: Shon Baton, MD, Janett Billow    . Metabolic syndrome 79/15/0569  . Nausea 05/19/2017  . Nausea vomiting and diarrhea   . 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 Obesity    250 lbs 8/11  . OBESITY 08/31/2006   Annotation: Wt. 248 lbs. 11/2004 Qualifier: Diagnosis of  By: Eddie Dibbles MD, Bhakti    . Panic disorder 11/05  . PANIC DISORDER 08/31/2006   Diagnosed 11/05, well-controlled with Paxil, patient also takes albuterol when she has a panic attack, maybe once/month   . Pectus excavatum 09/22/2012   Incidental finding on CXR in 2013.    Marland Kitchen Perirectal abscess 11/14/2011  . Prediabetes 09/22/2012  . Preventative health care 02/09/2014  . Proteinuria 07/17/2012  . Urinary incontinence without sensory awareness 12/17/2016  . Venous (peripheral) insufficiency 11/08/2015    Family History  Problem Relation Age of Onset  . Heart attack Mother        at the age of 82's  . Lung cancer Mother        metestatic, + smoker  . Aneurysm Mother   . Hyperlipidemia Mother   . Hypertension Mother   . Hypertension Father   . Heart attack Maternal Grandmother        at the age of 48's  . Breast cancer Maternal Aunt   . Colon cancer Neg Hx   . Stomach cancer Neg Hx   . Esophageal cancer Neg Hx   . Rectal cancer Neg Hx     Past Surgical History:  Procedure Laterality Date  . ABDOMINAL HYSTERECTOMY  04/2007   total abdominal hysterectomy ( cervix) with bilateral salpingo-oopherectomy  . APPENDECTOMY  02/23/2018   Procedure: APPENDECTOMY;  Surgeon: Robert Bellow, MD;  Location: [facility];  Service: General;;  . CHOLECYSTECTOMY  07/30/1999   Diagnosis of  By: Eddie Dibbles MD, Bhakti    . DILATION AND CURETTAGE OF UTERUS  1987  . INCISION AND DRAINAGE ABSCESS  11/02/2012   Procedure: INCISION AND DRAINAGE ABSCESS;   Surgeon: Odis Hollingshead, MD;  Location: [facility];  Service: General;  Laterality: N/A;  I&D anorectal abscess  . INCISION AND  DRAINAGE PERIRECTAL ABSCESS  11/14/2011   Procedure: IRRIGATION AND DEBRIDEMENT PERIRECTAL ABSCESS;  Surgeon: Zenovia Jarred, MD;  Location: [facility];  Service: General;  Laterality: N/A;  . LAPAROSCOPIC SIGMOID COLECTOMY N/A 02/23/2018   Procedure: LAPAROSCOPIC ASSISTED SIGMOID COLECTOMY;  Surgeon: Robert Bellow, MD;  Location: [facility];  Service: General;  Laterality: N/A;  . LEFT HEART CATH AND CORONARY ANGIOGRAPHY Left 08/11/2018   Procedure: LEFT HEART CATH AND CORONARY ANGIOGRAPHY;  Surgeon: Wellington Hampshire, MD;  Location: [facility];  Service: Cardiovascular;  Laterality: Left;  . NERVE, TENDON AND ARTERY REPAIR Left 06/11/2013   Procedure: EXPLORATION AND REPAIR LEFT FOREARM;  Surgeon: Schuyler Amor, MD;  Location: [facility];  Service: Orthopedics;  Laterality: Left;  . SHOULDER ARTHROSCOPY WITH ROTATOR CUFF REPAIR AND SUBACROMIAL DECOMPRESSION Left 03/06/2016   Procedure: LEFT SHOULDER ARTHROSCOPY WITH ROTATOR CUFF REPAIR AND SUBACROMIAL DECOMPRESSION, DISTAL CLAVICLE EXCISION;  Surgeon: Leandrew Koyanagi, MD;  Location: [facility];  Service: Orthopedics;  Laterality: Left;   Social History   Occupational History  . Not on file  Tobacco Use  . Smoking status: Never Smoker  . Smokeless tobacco: Never Used  Substance and Sexual Activity  . Alcohol use: No    Alcohol/week: 0.0 standard drinks  . Drug use: No  . Sexual activity: Yes    Birth control/protection: None

## 2018-12-11 DIAGNOSIS — G8929 Other chronic pain: Secondary | ICD-10-CM

## 2018-12-11 DIAGNOSIS — M5441 Lumbago with sciatica, right side: Principal | ICD-10-CM

## 2018-12-12 MED ORDER — GABAPENTIN 300 MG PO CAPS: 300.0000 mg | ORAL_CAPSULE | Freq: Every day | ORAL | 0 refills | Status: DC

## 2018-12-19 ENCOUNTER — Ambulatory Visit (INDEPENDENT_AMBULATORY_CARE_PROVIDER_SITE_OTHER): Payer: Self-pay

## 2018-12-19 ENCOUNTER — Encounter (INDEPENDENT_AMBULATORY_CARE_PROVIDER_SITE_OTHER): Payer: Self-pay | Admitting: Physical Medicine and Rehabilitation

## 2018-12-19 ENCOUNTER — Ambulatory Visit (INDEPENDENT_AMBULATORY_CARE_PROVIDER_SITE_OTHER): Payer: Self-pay | Admitting: Physical Medicine and Rehabilitation

## 2018-12-19 VITALS — BP 137/71 | HR 62 | Temp 98.2°F

## 2018-12-19 DIAGNOSIS — M5116 Intervertebral disc disorders with radiculopathy, lumbar region: Secondary | ICD-10-CM

## 2018-12-19 DIAGNOSIS — M5416 Radiculopathy, lumbar region: Secondary | ICD-10-CM

## 2018-12-19 MED ORDER — METHYLPREDNISOLONE ACETATE 80 MG/ML IJ SUSP
80.0000 mg | Freq: Once | INTRAMUSCULAR | Status: AC
  Administered 2018-12-19: 80 mg

## 2018-12-19 NOTE — Procedures (Signed)
Lumbar Epidural Steroid Injection - Interlaminar Approach with Fluoroscopic Guidance  Patient: [name]      Date of Birth: Jul 09, 1961 MRN: [id number] PCP: [name], NP      Visit Date: 12/19/2018   Universal Protocol:     Consent Given By: the patient  Position: PRONE  Additional Comments: Vital signs were monitored before and after the procedure. Patient was prepped and draped in the usual sterile fashion. The correct patient, procedure, and site was verified.   Injection Procedure Details:  Procedure Site One Meds Administered:  Meds ordered this encounter  Medications  . methylPREDNISolone acetate (DEPO-MEDROL) injection 80 mg     Laterality: Right  Location/Site:  L5-S1  Needle size: 20 G  Needle type: Tuohy  Needle Placement: Paramedian epidural  Findings:   -Comments: Excellent flow of contrast into the epidural space.  Procedure Details: Using a paramedian approach from the side mentioned above, the region overlying the inferior lamina was localized under fluoroscopic visualization and the soft tissues overlying this structure were infiltrated with 4 ml. of 1% Lidocaine without Epinephrine. The Tuohy needle was inserted into the epidural space using a paramedian approach.   The epidural space was localized using loss of resistance along with lateral and bi-planar fluoroscopic views.  After negative aspirate for air, blood, and CSF, a 2 ml. volume of Isovue-250 was injected into the epidural space and the flow of contrast was observed. Radiographs were obtained for documentation purposes.    The injectate was administered into the level noted above.   Additional Comments:  The patient tolerated the procedure well Dressing: 2 x 2 sterile gauze and Band-Aid    Post-procedure details: Patient was observed during the procedure. Post-procedure instructions were reviewed.  Patient left the clinic in stable condition.

## 2018-12-19 NOTE — Progress Notes (Signed)
   Numeric Pain Rating Scale and Functional Assessment Average Pain (8)   In the last MONTH (on 0-10 scale) has pain interfered with the following?  1. General activity like being  able to carry out your everyday physical activities such as walking, climbing stairs, carrying groceries, or moving a chair?  Rating(9)   +Driver, -BT, -Dye Allergies.  

## 2018-12-19 NOTE — Progress Notes (Signed)
[name] - 58 y.o. female MRN [id number]  Date of birth: April 04, 1961  Office Visit Note: Visit Date: 12/19/2018 PCP: [name], NP Referred by: [name], NP  Subjective: Chief Complaint  Patient presents with  . Lower Back - Pain  . Right Leg - Pain  . Left Leg - Pain   HPI:  [name] is a 58 y.o. female who comes in today At the request of Dr. Rodell Perna for L5-S1 interlaminar epidural steroid injection.  Patient is having both right and left leg pain.  She has right-sided disc protrusion with conjoined nerve roots on the right.  She has bilateral facet arthropathy and some ligamentum flavum thickening.  ROS Otherwise per HPI.  Assessment & Plan: Visit Diagnoses:  1. Lumbar radiculopathy   2. Radiculopathy due to lumbar intervertebral disc disorder     Plan: No additional findings.   Meds & Orders:  Meds ordered this encounter  Medications  . methylPREDNISolone acetate (DEPO-MEDROL) injection 80 mg    Orders Placed This Encounter  Procedures  . XR C-ARM NO REPORT  . Epidural Steroid injection    Follow-up: Return if symptoms worsen or fail to improve, for Rodell Perna, MD.   Procedures: No procedures performed  Lumbar Epidural Steroid Injection - Interlaminar Approach with Fluoroscopic Guidance  Patient: [name]      Date of Birth: 12/16/1960 MRN: [id number] PCP: [name], NP      Visit Date: 12/19/2018   Universal Protocol:     Consent Given By: the patient  Position: PRONE  Additional Comments: Vital signs were monitored before and after the procedure. Patient was prepped and draped in the usual sterile fashion. The correct patient, procedure, and site was verified.   Injection Procedure Details:  Procedure Site One Meds Administered:  Meds ordered this encounter  Medications  . methylPREDNISolone acetate (DEPO-MEDROL) injection 80 mg     Laterality: Right  Location/Site:  L5-S1  Needle size:  20 G  Needle type: Tuohy  Needle Placement: Paramedian epidural  Findings:   -Comments: Excellent flow of contrast into the epidural space.  Procedure Details: Using a paramedian approach from the side mentioned above, the region overlying the inferior lamina was localized under fluoroscopic visualization and the soft tissues overlying this structure were infiltrated with 4 ml. of 1% Lidocaine without Epinephrine. The Tuohy needle was inserted into the epidural space using a paramedian approach.   The epidural space was localized using loss of resistance along with lateral and bi-planar fluoroscopic views.  After negative aspirate for air, blood, and CSF, a 2 ml. volume of Isovue-250 was injected into the epidural space and the flow of contrast was observed. Radiographs were obtained for documentation purposes.    The injectate was administered into the level noted above.   Additional Comments:  The patient tolerated the procedure well Dressing: 2 x 2 sterile gauze and Band-Aid    Post-procedure details: Patient was observed during the procedure. Post-procedure instructions were reviewed.  Patient left the clinic in stable condition.   Clinical History: MRI LUMBAR SPINE WITHOUT CONTRAST  TECHNIQUE: Multiplanar, multisequence MR imaging of the lumbar spine was performed. No intravenous contrast was administered.  COMPARISON:  Radiography 10/28/2018.  MRI 03/26/2017.  FINDINGS: Segmentation:  5 lumbar type vertebral bodies.  Alignment:  Normal  Vertebrae:  Normal  Conus medullaris and cauda equina: Conus extends to the L1-2 level. Conus and cauda equina appear normal.  Paraspinal and other soft  tissues: Normal  Disc levels:  T11-12: Facet and ligamentous prominence encroach mildly upon the dorsal subarachnoid space but do not appear to cause compressive stenosis.  T12-L1 and L1-2: Normal.  L2-3: Small left foraminal disc bulge adjacent to the left L2  nerve. No frank nerve compression however.  L3-4: Left foraminal to extraforaminal disc protrusion that could focally compress the left L3 nerve. Mild facet degeneration and hypertrophy at this level.  L4-5: Minimal desiccation and bulging of the disc. Mild bilateral facet osteoarthritis. No apparent compressive stenosis. Findings could contribute to back pain or referred facet syndrome pain.  L5-S1: Bulging of the disc more prominent towards the right. Mild facet and ligamentous hypertrophy. Bulging disc material in the foraminal region is adjacent to the exiting L5 nerve and could irritate that structure. Distinct nerve compression is not demonstrated. The facet osteoarthritis could contribute to low back pain. Right S1 and S2 root sleeves appear to be conjoined, which can in some patients be symptomatic.  IMPRESSION: L2-3: Left foraminal to extraforaminal disc bulge adjacent to the left L2 nerve. Some potential this could cause L2 nerve irritation.  L3-4: Left foraminal to extraforaminal disc protrusion could compress the left L3 nerve.  L4-5: Mild disc bulge. Bilateral facet osteoarthritis. No apparent compressive stenosis. Findings at this level could be associated with back pain or facet syndrome pain.  L5-S1: Bulging of the disc more towards the right. Disc material is adjacent to the right L5 nerve and could irritate that structure, though nerve compression is not demonstrated. The facet arthritis could be painful.  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 Usually this is insignificant, but in some patients can be associated with pain syndromes.   Electronically Signed   By: Nelson Chimes M.D.   On: 11/16/2018 08:48     Objective:  VS:  HT:    WT:   BMI:     BP:137/71  HR:62bpm  TEMP:98.2 F (36.8 C)(Oral)  RESP:  Physical Exam  Ortho Exam Imaging: Xr C-arm No Report  Result Date: 12/19/2018 Please see Notes tab for imaging impression.

## 2018-12-23 ENCOUNTER — Ambulatory Visit: Payer: No Typology Code available for payment source | Admitting: Primary Care

## 2018-12-23 ENCOUNTER — Ambulatory Visit (INDEPENDENT_AMBULATORY_CARE_PROVIDER_SITE_OTHER): Payer: No Typology Code available for payment source | Admitting: Primary Care

## 2018-12-23 ENCOUNTER — Encounter: Payer: Self-pay | Admitting: Primary Care

## 2018-12-23 ENCOUNTER — Other Ambulatory Visit: Payer: Self-pay

## 2018-12-23 VITALS — BP 122/74 | HR 62 | Temp 97.9°F | Ht 61.0 in | Wt 282.5 lb

## 2018-12-23 DIAGNOSIS — F4323 Adjustment disorder with mixed anxiety and depressed mood: Secondary | ICD-10-CM

## 2018-12-23 MED ORDER — SERTRALINE HCL 50 MG PO TABS: 50.0000 mg | ORAL_TABLET | Freq: Every day | ORAL | 0 refills | Status: DC

## 2018-12-23 NOTE — Progress Notes (Signed)
Subjective:    Patient ID: [name], female    DOB: 07-Mar-1961, 58 y.o.   MRN: [id number]  HPI  [name] is a 58 year old female with a history of chronic back pain, CKD, hypertension, anxiety and depression, prediabetes who presents today with a chief complaint of anxiety.  She is currently managed on paroxetine 30 mg for which she's taken since 2007. Symptoms include feeling depressed, panic attacks, feeling anxious, worrying. Symptoms has been present for months but have increased over the last 2-3 weeks. She's had a lot of increased personal and family stress. GAD 7 score of 16 and PHQ 9 score of 19 today. She denies SI/HI.   She has never been treated with anything else other than paroxetine and Xanax.   Review of Systems  Constitutional: Positive for fatigue.  Cardiovascular: Negative for palpitations.  Psychiatric/Behavioral:       See HPI       Past Medical History:  Diagnosis Date  . Acute kidney injury ([place]) 05/19/2017  . Acute myofascial strain of lumbosacral region 08/20/2016  . Allergic rhinitis   . ANEMIA, IRON DEFICIENCY, history of 04/05/2008   Qualifier: Diagnosis of  By: Philbert Riser MD, Manrique    . Anginal pain ([place])    a. 08/2006 nl stress testing; b. 07/2018 Cath: Nl cors, EF 55-65%.  . Arthritis   . BPPV (benign paroxysmal positional vertigo) 01/07/2017  . CHOLECYSTECTOMY, HX OF    Annotation: 2000 Qualifier: Diagnosis of  By: Eddie Dibbles MD, Bhakti    . Chronic left maxillary sinus disease 09/22/2012  . Chronic low back pain 11/08/2015  . CKD (chronic kidney disease), stage III ([place])    a. Followed by [facility]) in [place].  Marland Kitchen Depression   . Diarrhea 05/19/2017  . Diverticulitis   . Essential hypertension 02/09/2014  . Family history of leukemia 09/22/2012   She states that both her uncle and his son was diagnosed with Leukemia. She would like to know more information about the leukemia.       Marland Kitchen GERD (gastroesophageal reflux disease)    no  meds  . Hepatic steatosis 09/22/2012   Incidental finding on abd CT in 2011   . History of blood transfusion 03/2008   Requried 2u prbc; menometrorrhagia, uterine fibroids.  . History of methicillin resistant staphylococcus aureus (MRSA) 2017   PCR+  . Hx of colonic polyp 10/26/2017  . Hypertension   . Hypertriglyceridemia    164 - 2/11  . Hypothyroidism   . Injury of left rotator cuff 12/02/2016  . Insomnia   . INSOMNIA UNSPECIFIED 05/01/2010   Qualifier: Diagnosis of  By: Shon Baton, MD, Janett Billow    . Metabolic syndrome 91/63/8466  . Nausea 05/19/2017  . Nausea vomiting and diarrhea   . 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 Obesity    250 lbs 8/11  . OBESITY 08/31/2006   Annotation: Wt. 248 lbs. 11/2004 Qualifier: Diagnosis of  By: Eddie Dibbles MD, Bhakti    . Panic disorder 11/05  . PANIC DISORDER 08/31/2006   Diagnosed 11/05, well-controlled with Paxil, patient also takes albuterol when she has a panic attack, maybe once/month   . Pectus excavatum 09/22/2012   Incidental finding on CXR in 2013.    Marland Kitchen Perirectal abscess 11/14/2011  . Prediabetes 09/22/2012  . Preventative health care 02/09/2014  . Proteinuria 07/17/2012  . Urinary incontinence without sensory awareness 12/17/2016  . Venous (peripheral) insufficiency 11/08/2015     Social History   Socioeconomic  History  . Marital status: Married    Spouse name: Not on file  . Number of children: Not on file  . Years of education: Not on file  . Highest education level: Not on file  Occupational History  . Not on file  Social Needs  . Financial resource strain: Not on file  . Food insecurity:    Worry: Not on file    Inability: Not on file  . Transportation needs:    Medical: Not on file    Non-medical: Not on file  Tobacco Use  . Smoking status: Never Smoker  . Smokeless tobacco: Never Used  Substance and Sexual Activity  . Alcohol use: No    Alcohol/week: 0.0 standard drinks  . Drug use: No  . Sexual activity: Yes    Birth  control/protection: None  Lifestyle  . Physical activity:    Days per week: Not on file    Minutes per session: Not on file  . Stress: Not on file  Relationships  . Social connections:    Talks on phone: Not on file    Gets together: Not on file    Attends religious service: Not on file    Active member of club or organization: Not on file    Attends meetings of clubs or organizations: Not on file    Relationship status: Not on file  . Intimate partner violence:    Fear of current or ex partner: Not on file    Emotionally abused: Not on file    Physically abused: Not on file    Forced sexual activity: Not on file  Other Topics Concern  . Not on file  Social History Narrative   Currently unemployed. Starting college in Aug for accounting.  Married, has 2 children lives with her youngest son.      Past Surgical History:  Procedure Laterality Date  . ABDOMINAL HYSTERECTOMY  04/2007   total abdominal hysterectomy ( cervix) with bilateral salpingo-oopherectomy  . APPENDECTOMY  02/23/2018   Procedure: APPENDECTOMY;  Surgeon: Robert Bellow, MD;  Location: [facility];  Service: General;;  . CHOLECYSTECTOMY  07/30/1999   Diagnosis of  By: Eddie Dibbles MD, Bhakti    . DILATION AND CURETTAGE OF UTERUS  1987  . INCISION AND DRAINAGE ABSCESS  11/02/2012   Procedure: INCISION AND DRAINAGE ABSCESS;  Surgeon: Odis Hollingshead, MD;  Location: [place];  Service: General;  Laterality: N/A;  I&D anorectal abscess  . INCISION AND DRAINAGE PERIRECTAL ABSCESS  11/14/2011   Procedure: IRRIGATION AND DEBRIDEMENT PERIRECTAL ABSCESS;  Surgeon: Zenovia Jarred, MD;  Location: [place];  Service: General;  Laterality: N/A;  . LAPAROSCOPIC SIGMOID COLECTOMY N/A 02/23/2018   Procedure: LAPAROSCOPIC ASSISTED SIGMOID COLECTOMY;  Surgeon: Robert Bellow, MD;  Location: [facility];  Service: General;  Laterality: N/A;  . LEFT HEART CATH AND CORONARY ANGIOGRAPHY Left 08/11/2018   Procedure: LEFT HEART CATH AND CORONARY  ANGIOGRAPHY;  Surgeon: Wellington Hampshire, MD;  Location: [facility] [facility];  Service: Cardiovascular;  Laterality: Left;  . NERVE, TENDON AND ARTERY REPAIR Left 06/11/2013   Procedure: EXPLORATION AND REPAIR LEFT FOREARM;  Surgeon: Schuyler Amor, MD;  Location: [place];  Service: Orthopedics;  Laterality: Left;  . SHOULDER ARTHROSCOPY WITH ROTATOR CUFF REPAIR AND SUBACROMIAL DECOMPRESSION Left 03/06/2016   Procedure: LEFT SHOULDER ARTHROSCOPY WITH ROTATOR CUFF REPAIR AND SUBACROMIAL DECOMPRESSION, DISTAL CLAVICLE EXCISION;  Surgeon: Leandrew Koyanagi, MD;  Location: [place];  Service: Orthopedics;  Laterality: Left;  Family History  Problem Relation Age of Onset  . Heart attack Mother        at the age of 65's  . Lung cancer Mother        metestatic, + smoker  . Aneurysm Mother   . Hyperlipidemia Mother   . Hypertension Mother   . Hypertension Father   . Heart attack Maternal Grandmother        at the age of 35's  . Breast cancer Maternal Aunt   . Colon cancer Neg Hx   . Stomach cancer Neg Hx   . Esophageal cancer Neg Hx   . Rectal cancer Neg Hx     Allergies  Allergen Reactions  . Cephalexin Itching  . Doxycycline Nausea And Vomiting  . Dilaudid [Hydromorphone Hcl] Other (See Comments)    Panic attack  . Sulfur Itching and Rash    Current Outpatient Medications on File Prior to Visit  Medication Sig Dispense Refill  . acetaminophen (TYLENOL) 500 MG tablet Take 1,000 mg by mouth every 8 (eight) hours as needed for mild pain.    Marland Kitchen amLODipine (NORVASC) 10 MG tablet Take 1 tablet by mouth once daily for blood pressure. (Patient taking differently: Take 10 mg by mouth at bedtime. Take 1 tablet by mouth once daily for blood pressure.) 90 tablet 3  . aspirin 81 MG chewable tablet Chew 81 mg by mouth daily.     . diazepam (VALIUM) 5 MG tablet Take 1 tablet 1 hour prior to MRI and a second immediately before if needed 2 tablet 0  . gabapentin (NEURONTIN) 300 MG capsule Take 1 capsule  (300 mg total) by mouth at bedtime. 30 capsule 0  . hydrochlorothiazide (HYDRODIURIL) 25 MG tablet Take 1 tablet (25 mg total) by mouth daily. 90 tablet 3  . levothyroxine (SYNTHROID, LEVOTHROID) 100 MCG tablet Take 1 tablet by mouth every morning with water only. No food or medications for 30 minutes. 90 tablet 0  . lisinopril (PRINIVIL,ZESTRIL) 30 MG tablet Take 1 tablet (30 mg total) by mouth daily. 90 tablet 0  . nitroGLYCERIN (NITROSTAT) 0.4 MG SL tablet Place 1 tablet (0.4 mg total) under the tongue every 5 (five) minutes as needed for chest pain. 50 tablet 0  . PARoxetine (PAXIL) 20 MG tablet Take 1.5 tablets (30 mg total) by mouth daily. (Patient taking differently: Take 30 mg by mouth at bedtime. ) 135 tablet 2   No current facility-administered medications on file prior to visit.     BP 122/74   Pulse 62   Temp 97.9 F (36.6 C) (Oral)   Ht 5\' 1"  (1.549 m)   Wt 282 lb 8 oz (128.1 kg)   LMP 06/17/2007   SpO2 97%   BMI 53.38 kg/m    Objective:   Physical Exam  Constitutional: She appears well-nourished.  Neck: Neck supple.  Cardiovascular: Normal rate and regular rhythm.  Respiratory: Effort normal and breath sounds normal.  Skin: Skin is warm and dry.  Psychiatric: She has a normal mood and affect.           Assessment & Plan:

## 2018-12-23 NOTE — Patient Instructions (Signed)
Stop paroxetine. Start sertraline (Zoloft) for anxiety and depression.   Please update me in 4 weeks. Let me know if you have any problems prior to then.  It was a pleasure to see you today!

## 2018-12-23 NOTE — Assessment & Plan Note (Signed)
Chronic for years, deteriorated over the last several months. GAD 7 score of 16 and PHQ 9 score of 19 today. Discussed options for treatment, we agreed to trial a different medication.  Stop paroxetine, start sertraline. She will update in 4 weeks or sooner if needed.

## 2019-01-10 ENCOUNTER — Encounter (INDEPENDENT_AMBULATORY_CARE_PROVIDER_SITE_OTHER): Payer: Self-pay | Admitting: Orthopaedic Surgery

## 2019-01-18 ENCOUNTER — Other Ambulatory Visit: Payer: Self-pay | Admitting: Primary Care

## 2019-01-18 DIAGNOSIS — E039 Hypothyroidism, unspecified: Secondary | ICD-10-CM

## 2019-01-18 DIAGNOSIS — I10 Essential (primary) hypertension: Secondary | ICD-10-CM

## 2019-01-18 NOTE — Telephone Encounter (Signed)
hydrochlorothiazide (HYDRODIURIL) 25 MG Last prescribed on 07/20/2017 #90 with 3 refills.    Last office visit on 12/23/2018. No future appointment

## 2019-01-18 NOTE — Telephone Encounter (Signed)
Has she been taking the hydrochlorothiazide medication every day? Also she needs a general follow-up office visit/physical, please schedule for early June 2020.

## 2019-01-19 NOTE — Telephone Encounter (Signed)
Noted, she will need an office visit for further refills.

## 2019-01-19 NOTE — Telephone Encounter (Signed)
Spoken to patient and she stated that she lost the bottle. She is taking the medication and now the bottle is empty. She would like a refills.  Patient stated that she will call back and schedule the appointment. She didn't want to schedule at this time.

## 2019-03-07 ENCOUNTER — Encounter: Payer: Self-pay | Admitting: Primary Care

## 2019-03-07 ENCOUNTER — Ambulatory Visit (INDEPENDENT_AMBULATORY_CARE_PROVIDER_SITE_OTHER): Payer: No Typology Code available for payment source | Admitting: Primary Care

## 2019-03-07 VITALS — BP 149/98 | Temp 97.6°F | Wt 270.0 lb

## 2019-03-07 DIAGNOSIS — M79671 Pain in right foot: Secondary | ICD-10-CM

## 2019-03-07 NOTE — Patient Instructions (Signed)
Elevate and ice your foot.  Advance activity as tolerated.   Please notify me if you develop redness, swelling, increased pain.  It was a pleasure to see you today! Allie Bossier, NP-C

## 2019-03-07 NOTE — Progress Notes (Signed)
Subjective:    Patient ID: [name], female    DOB: 05-29-1961, 58 y.o.   MRN: [id number]  HPI  Virtual Visit via Video Note  I connected with [name] on 03/07/19 at  3:40 PM EDT by a video enabled telemedicine application and verified that I am speaking with the correct person using two identifiers.  Location: Patient: Home Provider: Office   I discussed the limitations of evaluation and management by telemedicine and the availability of in person appointments. The patient expressed understanding and agreed to proceed.  History of Present Illness:  [name] is a 58 year old female with a history of CKD, hypertension, prediabetes, hypothyroidism who presents today with a chief complaint of foot pain.  Her pain is located to the right upper mid dorsal foot that she first noticed four days ago after prolonged walking while looking for a new car. Her symptoms improved for a few days then pain returned yesterday. Her pain will improve with elevation and rest, worse with walking for prolonged periods of time. She's taken Tylenol with some improvement.   She denies erythema, swelling, open wounds, injury.    Observations/Objective:  Alert and oriented. Appears well, not sickly. No distress. Speaking in complete sentences. No erythema, swelling, wounds to right dorsal foot. Normal ROM while sitting.  Assessment and Plan:  Suspect tendonitis given pain began after increased activity for which she's not accustomed.  Consider gout, but doesn't clinically appear to be the case.  No obvious cellulitis. Discussed to rest, ice, elevate. She cannot take NSAID due to CKD, Tylenol is fine.  She will update in a few days if no better. Return precautions provided.  Follow Up Instructions:  Elevate and ice your foot.  Advance activity as tolerated.   Please notify me if you develop redness, swelling, increased pain.  It was a pleasure to see you today! Allie Bossier,  NP-C    I discussed the assessment and treatment plan with the patient. The patient was provided an opportunity to ask questions and all were answered. The patient agreed with the plan and demonstrated an understanding of the instructions.   The patient was advised to call back or seek an in-person evaluation if the symptoms worsen or if the condition fails to improve as anticipated.     [name], NP    Review of Systems  Musculoskeletal: Positive for arthralgias.  Skin: Negative for color change.  Neurological: Negative for weakness and numbness.       Past Medical History:  Diagnosis Date  . Acute kidney injury ([place]) 05/19/2017  . Acute myofascial strain of lumbosacral region 08/20/2016  . Allergic rhinitis   . ANEMIA, IRON DEFICIENCY, history of 04/05/2008   Qualifier: Diagnosis of  By: Philbert Riser MD, Manrique    . Anginal pain ([place])    a. 08/2006 nl stress testing; b. 07/2018 Cath: Nl cors, EF 55-65%.  . Arthritis   . BPPV (benign paroxysmal positional vertigo) 01/07/2017  . CHOLECYSTECTOMY, HX OF    Annotation: 2000 Qualifier: Diagnosis of  By: Eddie Dibbles MD, Bhakti    . Chronic left maxillary sinus disease 09/22/2012  . Chronic low back pain 11/08/2015  . CKD (chronic kidney disease), stage III ([place])    a. Followed by [facility]) in [place].  Marland Kitchen Depression   . Diarrhea 05/19/2017  . Diverticulitis   . Essential hypertension 02/09/2014  . Family history of leukemia 09/22/2012   She states that both her  uncle and his son was diagnosed with Leukemia. She would like to know more information about the leukemia.       Marland Kitchen GERD (gastroesophageal reflux disease)    no meds  . Hepatic steatosis 09/22/2012   Incidental finding on abd CT in 2011   . History of blood transfusion 03/2008   Requried 2u prbc; menometrorrhagia, uterine fibroids.  . History of methicillin resistant staphylococcus aureus (MRSA) 2017   PCR+  . Hx of colonic polyp 10/26/2017  . Hypertension    . Hypertriglyceridemia    164 - 2/11  . Hypothyroidism   . Injury of left rotator cuff 12/02/2016  . Insomnia   . INSOMNIA UNSPECIFIED 05/01/2010   Qualifier: Diagnosis of  By: Shon Baton, MD, Janett Billow    . Metabolic syndrome 85/27/7824  . Nausea 05/19/2017  . Nausea vomiting and diarrhea   . 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 Obesity    250 lbs 8/11  . OBESITY 08/31/2006   Annotation: Wt. 248 lbs. 11/2004 Qualifier: Diagnosis of  By: Eddie Dibbles MD, Bhakti    . Panic disorder 11/05  . PANIC DISORDER 08/31/2006   Diagnosed 11/05, well-controlled with Paxil, patient also takes albuterol when she has a panic attack, maybe once/month   . Pectus excavatum 09/22/2012   Incidental finding on CXR in 2013.    Marland Kitchen Perirectal abscess 11/14/2011  . Prediabetes 09/22/2012  . Preventative health care 02/09/2014  . Proteinuria 07/17/2012  . Urinary incontinence without sensory awareness 12/17/2016  . Venous (peripheral) insufficiency 11/08/2015     Social History   Socioeconomic History  . Marital status: Married    Spouse name: Not on file  . Number of children: Not on file  . Years of education: Not on file  . Highest education level: Not on file  Occupational History  . Not on file  Social Needs  . Financial resource strain: Not on file  . Food insecurity:    Worry: Not on file    Inability: Not on file  . Transportation needs:    Medical: Not on file    Non-medical: Not on file  Tobacco Use  . Smoking status: Never Smoker  . Smokeless tobacco: Never Used  Substance and Sexual Activity  . Alcohol use: No    Alcohol/week: 0.0 standard drinks  . Drug use: No  . Sexual activity: Yes    Birth control/protection: None  Lifestyle  . Physical activity:    Days per week: Not on file    Minutes per session: Not on file  . Stress: Not on file  Relationships  . Social connections:    Talks on phone: Not on file    Gets together: Not on file    Attends religious service: Not on file    Active  member of club or organization: Not on file    Attends meetings of clubs or organizations: Not on file    Relationship status: Not on file  . Intimate partner violence:    Fear of current or ex partner: Not on file    Emotionally abused: Not on file    Physically abused: Not on file    Forced sexual activity: Not on file  Other Topics Concern  . Not on file  Social History Narrative   Currently unemployed. Starting college in Aug for accounting.  Married, has 2 children lives with her youngest son.      Past Surgical History:  Procedure Laterality Date  . ABDOMINAL HYSTERECTOMY  04/2007  total abdominal hysterectomy ( cervix) with bilateral salpingo-oopherectomy  . APPENDECTOMY  02/23/2018   Procedure: APPENDECTOMY;  Surgeon: Robert Bellow, MD;  Location: [facility];  Service: General;;  . CHOLECYSTECTOMY  07/30/1999   Diagnosis of  By: Eddie Dibbles MD, Bhakti    . DILATION AND CURETTAGE OF UTERUS  1987  . INCISION AND DRAINAGE ABSCESS  11/02/2012   Procedure: INCISION AND DRAINAGE ABSCESS;  Surgeon: Odis Hollingshead, MD;  Location: [place];  Service: General;  Laterality: N/A;  I&D anorectal abscess  . INCISION AND DRAINAGE PERIRECTAL ABSCESS  11/14/2011   Procedure: IRRIGATION AND DEBRIDEMENT PERIRECTAL ABSCESS;  Surgeon: Zenovia Jarred, MD;  Location: [place];  Service: General;  Laterality: N/A;  . LAPAROSCOPIC SIGMOID COLECTOMY N/A 02/23/2018   Procedure: LAPAROSCOPIC ASSISTED SIGMOID COLECTOMY;  Surgeon: Robert Bellow, MD;  Location: [facility];  Service: General;  Laterality: N/A;  . LEFT HEART CATH AND CORONARY ANGIOGRAPHY Left 08/11/2018   Procedure: LEFT HEART CATH AND CORONARY ANGIOGRAPHY;  Surgeon: Wellington Hampshire, MD;  Location: [place] [facility];  Service: Cardiovascular;  Laterality: Left;  . NERVE, TENDON AND ARTERY REPAIR Left 06/11/2013   Procedure: EXPLORATION AND REPAIR LEFT FOREARM;  Surgeon: Schuyler Amor, MD;  Location: [place];  Service: Orthopedics;   Laterality: Left;  . SHOULDER ARTHROSCOPY WITH ROTATOR CUFF REPAIR AND SUBACROMIAL DECOMPRESSION Left 03/06/2016   Procedure: LEFT SHOULDER ARTHROSCOPY WITH ROTATOR CUFF REPAIR AND SUBACROMIAL DECOMPRESSION, DISTAL CLAVICLE EXCISION;  Surgeon: Leandrew Koyanagi, MD;  Location: [place];  Service: Orthopedics;  Laterality: Left;    Family History  Problem Relation Age of Onset  . Heart attack Mother        at the age of 65's  . Lung cancer Mother        metestatic, + smoker  . Aneurysm Mother   . Hyperlipidemia Mother   . Hypertension Mother   . Hypertension Father   . Heart attack Maternal Grandmother        at the age of 54's  . Breast cancer Maternal Aunt   . Colon cancer Neg Hx   . Stomach cancer Neg Hx   . Esophageal cancer Neg Hx   . Rectal cancer Neg Hx     Allergies  Allergen Reactions  . Cephalexin Itching  . Doxycycline Nausea And Vomiting  . Dilaudid [Hydromorphone Hcl] Other (See Comments)    Panic attack  . Sulfur Itching and Rash    Current Outpatient Medications on File Prior to Visit  Medication Sig Dispense Refill  . acetaminophen (TYLENOL) 500 MG tablet Take 1,000 mg by mouth every 8 (eight) hours as needed for mild pain.    Marland Kitchen amLODipine (NORVASC) 10 MG tablet Take 1 tablet by mouth once daily for blood pressure. (Patient taking differently: Take 10 mg by mouth at bedtime. Take 1 tablet by mouth once daily for blood pressure.) 90 tablet 3  . aspirin 81 MG chewable tablet Chew 81 mg by mouth daily.     . diazepam (VALIUM) 5 MG tablet Take 1 tablet 1 hour prior to MRI and a second immediately before if needed 2 tablet 0  . gabapentin (NEURONTIN) 300 MG capsule Take 1 capsule (300 mg total) by mouth at bedtime. 30 capsule 0  . hydrochlorothiazide (HYDRODIURIL) 25 MG tablet Take 1 tablet (25 mg total) by mouth daily. For blood pressure. 90 tablet 0  . levothyroxine (SYNTHROID, LEVOTHROID) 100 MCG tablet Take 1 tablet by mouth every morning with water  only. No food or  medications for 30 minutes. 90 tablet 0  . lisinopril (PRINIVIL,ZESTRIL) 30 MG tablet Take 1 tablet (30 mg total) by mouth daily. 90 tablet 0  . nitroGLYCERIN (NITROSTAT) 0.4 MG SL tablet Place 1 tablet (0.4 mg total) under the tongue every 5 (five) minutes as needed for chest pain. 50 tablet 0  . sertraline (ZOLOFT) 50 MG tablet Take 1 tablet (50 mg total) by mouth daily. For anxiety and depression. 90 tablet 0   No current facility-administered medications on file prior to visit.     BP (!) 149/98   Temp 97.6 F (36.4 C) (Oral)   Wt 270 lb (122.5 kg)   LMP 06/17/2007   BMI 51.02 kg/m    Objective:   Physical Exam  Constitutional: She is oriented to person, place, and time. She appears well-nourished.  Respiratory: Effort normal.  Musculoskeletal:     Right foot: Normal range of motion.       Feet:     Comments: Pain to dorsal right foot. No erythema, swelling, wounds. Normal ROM.  Neurological: She is alert and oriented to person, place, and time.  Skin: Skin is dry. No erythema.  Psychiatric: She has a normal mood and affect.           Assessment & Plan:

## 2019-03-16 ENCOUNTER — Encounter: Payer: Self-pay | Admitting: Primary Care

## 2019-03-16 ENCOUNTER — Telehealth (INDEPENDENT_AMBULATORY_CARE_PROVIDER_SITE_OTHER): Payer: No Typology Code available for payment source | Admitting: Primary Care

## 2019-03-16 VITALS — BP 138/86 | HR 66 | Temp 97.8°F | Ht 61.0 in | Wt 281.0 lb

## 2019-03-16 DIAGNOSIS — Z1239 Encounter for other screening for malignant neoplasm of breast: Secondary | ICD-10-CM

## 2019-03-16 DIAGNOSIS — N183 Chronic kidney disease, stage 3 unspecified: Secondary | ICD-10-CM

## 2019-03-16 DIAGNOSIS — R7303 Prediabetes: Secondary | ICD-10-CM

## 2019-03-16 DIAGNOSIS — F4323 Adjustment disorder with mixed anxiety and depressed mood: Secondary | ICD-10-CM

## 2019-03-16 DIAGNOSIS — E781 Pure hyperglyceridemia: Secondary | ICD-10-CM

## 2019-03-16 DIAGNOSIS — E039 Hypothyroidism, unspecified: Secondary | ICD-10-CM

## 2019-03-16 DIAGNOSIS — I10 Essential (primary) hypertension: Secondary | ICD-10-CM

## 2019-03-16 DIAGNOSIS — G8929 Other chronic pain: Secondary | ICD-10-CM

## 2019-03-16 DIAGNOSIS — M5441 Lumbago with sciatica, right side: Secondary | ICD-10-CM

## 2019-03-16 MED ORDER — AMLODIPINE BESYLATE 10 MG PO TABS: ORAL_TABLET | ORAL | 3 refills | Status: DC

## 2019-03-16 MED ORDER — LISINOPRIL 40 MG PO TABS: 40.0000 mg | ORAL_TABLET | Freq: Every day | ORAL | 0 refills | Status: DC

## 2019-03-16 NOTE — Assessment & Plan Note (Signed)
Visited nephrology once due to cost. Will trial a hold of her HCTZ to see if this helps to improve renal function. She is on no other offending agents. Repeat BMP pending for now, will also repeat in 2 weeks.

## 2019-03-16 NOTE — Progress Notes (Signed)
Subjective:    Patient ID: [name], female    DOB: 1961-02-20, 58 y.o.   MRN: [id number]  HPI  Virtual Visit via Video Note  I connected with [name] on 03/16/19 at  9:00 AM EDT by a video enabled telemedicine application and verified that I am speaking with the correct person using two identifiers.  Location: Patient: Home Provider: Office   I discussed the limitations of evaluation and management by telemedicine and the availability of in person appointments. The patient expressed understanding and agreed to proceed.  History of Present Illness:  [name] is a 58 year old female who presents today for follow up of chronic conditions. She is due for mammogram. Colonoscopy UTD.  1) Essential Hypertension: Currently managed on Amlodipine 10 mg, HCTZ 25 mg, lisinopril 30 mg. She is checking her BP at home and mostly sees readings of 140/90's. She is following with cardiology and plans on scheduling an appointment soon. She denies chest pain, dizziness, shortness of breath with rest and mild exertion.   BP Readings from Last 3 Encounters:  03/16/19 138/86  03/07/19 (!) 149/98  12/23/18 122/74    2) CKD: Managed on ACE. Currently following with nephrology, no recent visit due to cost. No recent renal function test.   3) Hypothyroidism: Currently managed on levothyroxine 100 mcg. SHe is taking her levothyroxine every morning with water, 2 hours before breakfast. She doesn't take with any other medications. She is needing a refill.   4) Anxiety and Depression: Currently managed on Zoloft 50 mg and is doing well. Denies SI/HI.     Observations/Objective:  Alert and oriented. Appears well, not sickly. No distress. Speaking in complete sentences.   Assessment and Plan:  See problem based charting.  Follow Up Instructions:  We've increased the dose of your lisinopril to 40 mg. Hold your hydrochlorothiazide 25 mg tablets for blood pressure right now.  Continue taking Amlodipine 10 mg for blood pressure.   Call the breast center to schedule your mammogram.  Call the front office to schedule a lab only appointment for now and then a follow up visit for blood pressure in 2 weeks.  It was a pleasure to see you today! Allie Bossier, NP-C    I discussed the assessment and treatment plan with the patient. The patient was provided an opportunity to ask questions and all were answered. The patient agreed with the plan and demonstrated an understanding of the instructions.   The patient was advised to call back or seek an in-person evaluation if the symptoms worsen or if the condition fails to improve as anticipated.     [name], NP    Review of Systems  Constitutional: Negative for fatigue.  Respiratory: Negative for shortness of breath.   Cardiovascular: Negative for chest pain.  Gastrointestinal: Negative for abdominal pain.  Neurological: Negative for dizziness and headaches.  Psychiatric/Behavioral: Negative for suicidal ideas. The patient is not nervous/anxious.        Past Medical History:  Diagnosis Date  . Acute kidney injury ([place]) 05/19/2017  . Acute myofascial strain of lumbosacral region 08/20/2016  . Allergic rhinitis   . ANEMIA, IRON DEFICIENCY, history of 04/05/2008   Qualifier: Diagnosis of  By: Philbert Riser MD, Manrique    . Anginal pain ([place])    a. 08/2006 nl stress testing; b. 07/2018 Cath: Nl cors, EF 55-65%.  . Arthritis   . BPPV (benign paroxysmal positional vertigo) 01/07/2017  . CHOLECYSTECTOMY, HX OF  Annotation: 2000 Qualifier: Diagnosis of  By: Eddie Dibbles MD, Bhakti    . Chronic left maxillary sinus disease 09/22/2012  . Chronic low back pain 11/08/2015  . CKD (chronic kidney disease), stage III ([place])    a. Followed by [facility] in [place].  Marland Kitchen Depression   . Diarrhea 05/19/2017  . Diverticulitis   . Essential hypertension 02/09/2014  . Family history of leukemia 09/22/2012   She states  that both her uncle and his son was diagnosed with Leukemia. She would like to know more information about the leukemia.       Marland Kitchen GERD (gastroesophageal reflux disease)    no meds  . Hepatic steatosis 09/22/2012   Incidental finding on abd CT in 2011   . History of blood transfusion 03/2008   Requried 2u prbc; menometrorrhagia, uterine fibroids.  . History of methicillin resistant staphylococcus aureus (MRSA) 2017   PCR+  . Hx of colonic polyp 10/26/2017  . Hypertension   . Hypertriglyceridemia    164 - 2/11  . Hypothyroidism   . Injury of left rotator cuff 12/02/2016  . Insomnia   . INSOMNIA UNSPECIFIED 05/01/2010   Qualifier: Diagnosis of  By: Shon Baton, MD, Janett Billow    . Metabolic syndrome 29/92/4268  . Nausea 05/19/2017  . Nausea vomiting and diarrhea   . 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 Obesity    250 lbs 8/11  . OBESITY 08/31/2006   Annotation: Wt. 248 lbs. 11/2004 Qualifier: Diagnosis of  By: Eddie Dibbles MD, Bhakti    . Panic disorder 11/05  . PANIC DISORDER 08/31/2006   Diagnosed 11/05, well-controlled with Paxil, patient also takes albuterol when she has a panic attack, maybe once/month   . Pectus excavatum 09/22/2012   Incidental finding on CXR in 2013.    Marland Kitchen Perirectal abscess 11/14/2011  . Prediabetes 09/22/2012  . Preventative health care 02/09/2014  . Proteinuria 07/17/2012  . Urinary incontinence without sensory awareness 12/17/2016  . Venous (peripheral) insufficiency 11/08/2015     Social History   Socioeconomic History  . Marital status: Married    Spouse name: Not on file  . Number of children: Not on file  . Years of education: Not on file  . Highest education level: Not on file  Occupational History  . Not on file  Social Needs  . Financial resource strain: Not on file  . Food insecurity:    Worry: Not on file    Inability: Not on file  . Transportation needs:    Medical: Not on file    Non-medical: Not on file  Tobacco Use  . Smoking status: Never Smoker  .  Smokeless tobacco: Never Used  Substance and Sexual Activity  . Alcohol use: No    Alcohol/week: 0.0 standard drinks  . Drug use: No  . Sexual activity: Yes    Birth control/protection: None  Lifestyle  . Physical activity:    Days per week: Not on file    Minutes per session: Not on file  . Stress: Not on file  Relationships  . Social connections:    Talks on phone: Not on file    Gets together: Not on file    Attends religious service: Not on file    Active member of club or organization: Not on file    Attends meetings of clubs or organizations: Not on file    Relationship status: Not on file  . Intimate partner violence:    Fear of current or ex partner: Not  on file    Emotionally abused: Not on file    Physically abused: Not on file    Forced sexual activity: Not on file  Other Topics Concern  . Not on file  Social History Narrative   Currently unemployed. Starting college in Aug for accounting.  Married, has 2 children lives with her youngest son.      Past Surgical History:  Procedure Laterality Date  . ABDOMINAL HYSTERECTOMY  04/2007   total abdominal hysterectomy ( cervix) with bilateral salpingo-oopherectomy  . APPENDECTOMY  02/23/2018   Procedure: APPENDECTOMY;  Surgeon: Robert Bellow, MD;  Location: [facility];  Service: General;;  . CHOLECYSTECTOMY  07/30/1999   Diagnosis of  By: Eddie Dibbles MD, Bhakti    . DILATION AND CURETTAGE OF UTERUS  1987  . INCISION AND DRAINAGE ABSCESS  11/02/2012   Procedure: INCISION AND DRAINAGE ABSCESS;  Surgeon: Odis Hollingshead, MD;  Location: [place] [facility];  Service: General;  Laterality: N/A;  I&D anorectal abscess  . INCISION AND DRAINAGE PERIRECTAL ABSCESS  11/14/2011   Procedure: IRRIGATION AND DEBRIDEMENT PERIRECTAL ABSCESS;  Surgeon: Zenovia Jarred, MD;  Location: [place];  Service: General;  Laterality: N/A;  . LAPAROSCOPIC SIGMOID COLECTOMY N/A 02/23/2018   Procedure: LAPAROSCOPIC ASSISTED SIGMOID COLECTOMY;  Surgeon: Robert Bellow, MD;  Location: [facility];  Service: General;  Laterality: N/A;  . LEFT HEART CATH AND CORONARY ANGIOGRAPHY Left 08/11/2018   Procedure: LEFT HEART CATH AND CORONARY ANGIOGRAPHY;  Surgeon: Wellington Hampshire, MD;  Location: [facility];  Service: Cardiovascular;  Laterality: Left;  . NERVE, TENDON AND ARTERY REPAIR Left 06/11/2013   Procedure: EXPLORATION AND REPAIR LEFT FOREARM;  Surgeon: Schuyler Amor, MD;  Location: [facility];  Service: Orthopedics;  Laterality: Left;  . SHOULDER ARTHROSCOPY WITH ROTATOR CUFF REPAIR AND SUBACROMIAL DECOMPRESSION Left 03/06/2016   Procedure: LEFT SHOULDER ARTHROSCOPY WITH ROTATOR CUFF REPAIR AND SUBACROMIAL DECOMPRESSION, DISTAL CLAVICLE EXCISION;  Surgeon: Leandrew Koyanagi, MD;  Location: [facility];  Service: Orthopedics;  Laterality: Left;    Family History  Problem Relation Age of Onset  . Heart attack Mother        at the age of 3's  . Lung cancer Mother        metestatic, + smoker  . Aneurysm Mother   . Hyperlipidemia Mother   . Hypertension Mother   . Hypertension Father   . Heart attack Maternal Grandmother        at the age of 78's  . Breast cancer Maternal Aunt   . Colon cancer Neg Hx   . Stomach cancer Neg Hx   . Esophageal cancer Neg Hx   . Rectal cancer Neg Hx     Allergies  Allergen Reactions  . Cephalexin Itching  . Doxycycline Nausea And Vomiting  . Dilaudid [Hydromorphone Hcl] Other (See Comments)    Panic attack  . Sulfur Itching and Rash    Current Outpatient Medications on File Prior to Visit  Medication Sig Dispense Refill  . acetaminophen (TYLENOL) 500 MG tablet Take 1,000 mg by mouth every 8 (eight) hours as needed for mild pain.    Marland Kitchen aspirin 81 MG chewable tablet Chew 81 mg by mouth daily.     . diazepam (VALIUM) 5 MG tablet Take 1 tablet 1 hour prior to MRI and a second immediately before if needed 2 tablet 0  . gabapentin (NEURONTIN) 300 MG capsule Take 1 capsule (300 mg total) by mouth at bedtime. 30 capsule  0   . hydrochlorothiazide (HYDRODIURIL) 25 MG tablet Take 1 tablet (25 mg total) by mouth daily. For blood pressure. 90 tablet 0  . levothyroxine (SYNTHROID, LEVOTHROID) 100 MCG tablet Take 1 tablet by mouth every morning with water only. No food or medications for 30 minutes. 90 tablet 0  . nitroGLYCERIN (NITROSTAT) 0.4 MG SL tablet Place 1 tablet (0.4 mg total) under the tongue every 5 (five) minutes as needed for chest pain. 50 tablet 0  . sertraline (ZOLOFT) 50 MG tablet Take 1 tablet (50 mg total) by mouth daily. For anxiety and depression. 90 tablet 0   No current facility-administered medications on file prior to visit.     BP 138/86   Pulse 66   Temp 97.8 F (36.6 C) (Temporal)   Ht 5\' 1"  (1.549 m)   Wt 281 lb (127.5 kg)   LMP 06/17/2007   BMI 53.09 kg/m    Objective:   Physical Exam  Constitutional: She is oriented to person, place, and time. She appears well-nourished.  Respiratory: Effort normal.  Neurological: She is alert and oriented to person, place, and time.  Psychiatric: She has a normal mood and affect.           Assessment & Plan:

## 2019-03-16 NOTE — Assessment & Plan Note (Signed)
Repeat A1C pending. 

## 2019-03-16 NOTE — Assessment & Plan Note (Signed)
Taking levothyroxine appropriately. Repeat TSH pending. Will refill medication once labs return.

## 2019-03-16 NOTE — Patient Instructions (Signed)
We've increased the dose of your lisinopril to 40 mg. Hold your hydrochlorothiazide 25 mg tablets for blood pressure right now. Continue taking Amlodipine 10 mg for blood pressure.   Call the breast center to schedule your mammogram.  Call the front office to schedule a lab only appointment for now and then a follow up visit for blood pressure in 2 weeks.  It was a pleasure to see you today! Allie Bossier, NP-C

## 2019-03-16 NOTE — Assessment & Plan Note (Signed)
Doing well on Zoloft. Continue same.

## 2019-03-16 NOTE — Assessment & Plan Note (Signed)
Repeat lipids pending. Encouraged a healthy diet and regular exercise.

## 2019-03-16 NOTE — Assessment & Plan Note (Signed)
Following with orthopedics, no improvement with lumbar injection. The plan is for her to see the bariatric clinic for weight loss plan.

## 2019-03-16 NOTE — Assessment & Plan Note (Signed)
Above goal with most home readings, also in the office on several occasions.  Increase lisinopril to 40 mg. Hold HCTZ to see if this helps to improve renal function. Continue Amlodipine 10 mg. CMP pending now.  Will have her follow up in the office in 2 weeks for re-evaluation.

## 2019-03-16 NOTE — Assessment & Plan Note (Signed)
Will be evaluated at the bariatric center when possible.

## 2019-03-17 ENCOUNTER — Other Ambulatory Visit (INDEPENDENT_AMBULATORY_CARE_PROVIDER_SITE_OTHER): Payer: No Typology Code available for payment source

## 2019-03-17 ENCOUNTER — Other Ambulatory Visit: Payer: Self-pay | Admitting: Primary Care

## 2019-03-17 DIAGNOSIS — N183 Chronic kidney disease, stage 3 unspecified: Secondary | ICD-10-CM

## 2019-03-17 DIAGNOSIS — R7303 Prediabetes: Secondary | ICD-10-CM

## 2019-03-17 DIAGNOSIS — I10 Essential (primary) hypertension: Secondary | ICD-10-CM

## 2019-03-17 DIAGNOSIS — E039 Hypothyroidism, unspecified: Secondary | ICD-10-CM

## 2019-03-17 DIAGNOSIS — E781 Pure hyperglyceridemia: Secondary | ICD-10-CM

## 2019-03-17 LAB — COMPREHENSIVE METABOLIC PANEL
ALT: 12 U/L (ref 0–35)
AST: 13 U/L (ref 0–37)
Albumin: 3.6 g/dL (ref 3.5–5.2)
Alkaline Phosphatase: 84 U/L (ref 39–117)
BUN: 18 mg/dL (ref 6–23)
CO2: 27 mEq/L (ref 19–32)
Calcium: 8.8 mg/dL (ref 8.4–10.5)
Chloride: 102 mEq/L (ref 96–112)
Creatinine, Ser: 1.17 mg/dL (ref 0.40–1.20)
GFR: 47.93 mL/min — ABNORMAL LOW (ref >60.00)
Glucose, Bld: 157 mg/dL — ABNORMAL HIGH (ref 70–99)
Potassium: 4.1 mEq/L (ref 3.5–5.1)
Sodium: 139 mEq/L (ref 135–145)
Total Bilirubin: 0.5 mg/dL (ref 0.2–1.2)
Total Protein: 6.8 g/dL (ref 6.0–8.3)

## 2019-03-17 LAB — LIPID PANEL
Cholesterol: 185 mg/dL (ref 0–200)
HDL: 36.2 mg/dL — ABNORMAL LOW (ref >39.00)
LDL Cholesterol: 118 mg/dL — ABNORMAL HIGH (ref 0–99)
NonHDL: 148.97
Total CHOL/HDL Ratio: 5
Triglycerides: 156 mg/dL — ABNORMAL HIGH (ref 0.0–149.0)
VLDL: 31.2 mg/dL (ref 0.0–40.0)

## 2019-03-17 LAB — TSH: TSH: 8.64 u[IU]/mL — ABNORMAL HIGH (ref 0.35–4.50)

## 2019-03-17 LAB — HEMOGLOBIN A1C: Hgb A1c MFr Bld: 5.9 % (ref 4.6–6.5)

## 2019-03-17 LAB — VITAMIN D 25 HYDROXY (VIT D DEFICIENCY, FRACTURES): VITD: 23.06 ng/mL — ABNORMAL LOW (ref 30.00–100.00)

## 2019-03-17 LAB — URIC ACID: Uric Acid, Serum: 9 mg/dL — ABNORMAL HIGH (ref 2.4–7.0)

## 2019-03-17 MED ORDER — LEVOTHYROXINE SODIUM 125 MCG PO TABS: ORAL_TABLET | ORAL | 1 refills | Status: DC

## 2019-03-28 ENCOUNTER — Other Ambulatory Visit: Payer: Self-pay

## 2019-03-28 ENCOUNTER — Encounter: Payer: Self-pay | Admitting: Primary Care

## 2019-03-28 ENCOUNTER — Ambulatory Visit (INDEPENDENT_AMBULATORY_CARE_PROVIDER_SITE_OTHER): Payer: Self-pay | Admitting: Primary Care

## 2019-03-28 VITALS — BP 140/92 | HR 64 | Temp 97.8°F | Ht 61.0 in | Wt 277.8 lb

## 2019-03-28 DIAGNOSIS — Z23 Encounter for immunization: Secondary | ICD-10-CM

## 2019-03-28 DIAGNOSIS — N183 Chronic kidney disease, stage 3 unspecified: Secondary | ICD-10-CM

## 2019-03-28 DIAGNOSIS — E790 Hyperuricemia without signs of inflammatory arthritis and tophaceous disease: Secondary | ICD-10-CM

## 2019-03-28 DIAGNOSIS — M1A9XX0 Chronic gout, unspecified, without tophus (tophi): Secondary | ICD-10-CM | POA: Insufficient documentation

## 2019-03-28 DIAGNOSIS — I10 Essential (primary) hypertension: Secondary | ICD-10-CM

## 2019-03-28 DIAGNOSIS — E039 Hypothyroidism, unspecified: Secondary | ICD-10-CM

## 2019-03-28 LAB — BASIC METABOLIC PANEL
BUN: 17 mg/dL (ref 6–23)
CO2: 26 mEq/L (ref 19–32)
Calcium: 9.6 mg/dL (ref 8.4–10.5)
Chloride: 103 mEq/L (ref 96–112)
Creatinine, Ser: 1.12 mg/dL (ref 0.40–1.20)
GFR: 50.4 mL/min — ABNORMAL LOW (ref >60.00)
Glucose, Bld: 94 mg/dL (ref 70–99)
Potassium: 4.6 mEq/L (ref 3.5–5.1)
Sodium: 139 mEq/L (ref 135–145)

## 2019-03-28 LAB — URIC ACID: Uric Acid, Serum: 8.7 mg/dL — ABNORMAL HIGH (ref 2.4–7.0)

## 2019-03-28 NOTE — Assessment & Plan Note (Signed)
Likely chronic gout, no prior diagnosis. Repeat Uric acid pending. She does not appear to be experiencing an acute gout flare so we will initiate renally dosed allopurinol once labs return.

## 2019-03-28 NOTE — Assessment & Plan Note (Signed)
Compliant to levothyroxine 125 mcg and is taking appropriately. Repeat TSH in 4 weeks.

## 2019-03-28 NOTE — Addendum Note (Signed)
Addended by: Jacqualin Combes on: 03/28/2019 03:38 PM   Modules accepted: Orders

## 2019-03-28 NOTE — Patient Instructions (Signed)
Stop by the lab prior to leaving today. I will notify you of your results once received.   It was a pleasure to see you today!  

## 2019-03-28 NOTE — Progress Notes (Signed)
Subjective:    Patient ID: [name], female    DOB: 1961/10/02, 58 y.o.   MRN: [id number]  HPI  [name] is a 58 year old female who presents today for follow up. She is also due for a tetanus vaccination.   She was last evaluated virtually on 03/16/19 for follow up of chronic conditions.  1) CKD/Hypertension: Chronic, visited nephrology once but could not return due to cost. During her last visit with [place] we decided to try holding her HCTZ as this may be causing insult to the kidneys. She denied taking other offending agents.   Since her last visit she has refrained from taking HCTZ. She is checking her BP at home which is running 140's/90's. She denies chest pain, dizziness, chest pain.   She is compliant to Amlodipine 10 mg and Lisinopril 40 mg.   BP Readings from Last 3 Encounters:  03/28/19 (!) 140/92  03/16/19 138/86  03/07/19 (!) 149/98   2) Gout: Uric acid level of 9.0 on labs from 03/17/19. She was previously  evaluated for acute foot pain on 03/07/19, she had no swelling, redness, increased pain so we treated conservatively.   She denies a prior diagnosis of gout. She continues to experience intermittent bilateral foot pain, mostly to the lateral and medial sides. She denies any erythema and swelling.  Wt Readings from Last 3 Encounters:  03/28/19 277 lb 12 oz (126 kg)  03/16/19 281 lb (127.5 kg)  03/07/19 270 lb (122.5 kg)     Review of Systems  Eyes: Negative for visual disturbance.  Respiratory: Negative for shortness of breath.   Cardiovascular: Negative for chest pain.  Musculoskeletal: Positive for arthralgias.       Bilateral foot pain  Skin: Negative for color change.  Neurological: Negative for dizziness and headaches.       Past Medical History:  Diagnosis Date  . Acute kidney injury ([place]) 05/19/2017  . Acute myofascial strain of lumbosacral region 08/20/2016  . Allergic rhinitis   . ANEMIA, IRON DEFICIENCY, history of 04/05/2008   Qualifier: Diagnosis of  By: Philbert Riser MD, Manrique    . Anginal pain ([place])    a. 08/2006 nl stress testing; b. 07/2018 Cath: Nl cors, EF 55-65%.  . Arthritis   . BPPV (benign paroxysmal positional vertigo) 01/07/2017  . CHOLECYSTECTOMY, HX OF    Annotation: 2000 Qualifier: Diagnosis of  By: Eddie Dibbles MD, Bhakti    . Chronic left maxillary sinus disease 09/22/2012  . Chronic low back pain 11/08/2015  . CKD (chronic kidney disease), stage III ([place])    a. Followed by [facility]) in [place].  Marland Kitchen Depression   . Diarrhea 05/19/2017  . Diverticulitis   . Essential hypertension 02/09/2014  . Family history of leukemia 09/22/2012   She states that both her uncle and his son was diagnosed with Leukemia. She would like to know more information about the leukemia.       Marland Kitchen GERD (gastroesophageal reflux disease)    no meds  . Hepatic steatosis 09/22/2012   Incidental finding on abd CT in 2011   . History of blood transfusion 03/2008   Requried 2u prbc; menometrorrhagia, uterine fibroids.  . History of methicillin resistant staphylococcus aureus (MRSA) 2017   PCR+  . Hx of colonic polyp 10/26/2017  . Hypertension   . Hypertriglyceridemia    164 - 2/11  . Hypothyroidism   . Injury of left rotator cuff 12/02/2016  . Insomnia   . INSOMNIA  UNSPECIFIED 05/01/2010   Qualifier: Diagnosis of  By: Shon Baton, MD, Janett Billow    . Metabolic syndrome 62/56/3893  . Nausea 05/19/2017  . Nausea vomiting and diarrhea   . 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 Obesity    250 lbs 8/11  . OBESITY 08/31/2006   Annotation: Wt. 248 lbs. 11/2004 Qualifier: Diagnosis of  By: Eddie Dibbles MD, Bhakti    . Panic disorder 11/05  . PANIC DISORDER 08/31/2006   Diagnosed 11/05, well-controlled with Paxil, patient also takes albuterol when she has a panic attack, maybe once/month   . Pectus excavatum 09/22/2012   Incidental finding on CXR in 2013.    Marland Kitchen Perirectal abscess 11/14/2011  . Prediabetes 09/22/2012  . Preventative health care  02/09/2014  . Proteinuria 07/17/2012  . Urinary incontinence without sensory awareness 12/17/2016  . Venous (peripheral) insufficiency 11/08/2015     Social History   Socioeconomic History  . Marital status: Married    Spouse name: Not on file  . Number of children: Not on file  . Years of education: Not on file  . Highest education level: Not on file  Occupational History  . Not on file  Social Needs  . Financial resource strain: Not on file  . Food insecurity    Worry: Not on file    Inability: Not on file  . Transportation needs    Medical: Not on file    Non-medical: Not on file  Tobacco Use  . Smoking status: Never Smoker  . Smokeless tobacco: Never Used  Substance and Sexual Activity  . Alcohol use: No    Alcohol/week: 0.0 standard drinks  . Drug use: No  . Sexual activity: Yes    Birth control/protection: None  Lifestyle  . Physical activity    Days per week: Not on file    Minutes per session: Not on file  . Stress: Not on file  Relationships  . Social Herbalist on phone: Not on file    Gets together: Not on file    Attends religious service: Not on file    Active member of club or organization: Not on file    Attends meetings of clubs or organizations: Not on file    Relationship status: Not on file  . Intimate partner violence    Fear of current or ex partner: Not on file    Emotionally abused: Not on file    Physically abused: Not on file    Forced sexual activity: Not on file  Other Topics Concern  . Not on file  Social History Narrative   Currently unemployed. Starting college in Aug for accounting.  Married, has 2 children lives with her youngest son.      Past Surgical History:  Procedure Laterality Date  . ABDOMINAL HYSTERECTOMY  04/2007   total abdominal hysterectomy ( cervix) with bilateral salpingo-oopherectomy  . APPENDECTOMY  02/23/2018   Procedure: APPENDECTOMY;  Surgeon: Robert Bellow, MD;  Location: [facility];  Service:  General;;  . CHOLECYSTECTOMY  07/30/1999   Diagnosis of  By: Eddie Dibbles MD, Bhakti    . DILATION AND CURETTAGE OF UTERUS  1987  . INCISION AND DRAINAGE ABSCESS  11/02/2012   Procedure: INCISION AND DRAINAGE ABSCESS;  Surgeon: Odis Hollingshead, MD;  Location: [facility];  Service: General;  Laterality: N/A;  I&D anorectal abscess  . INCISION AND DRAINAGE PERIRECTAL ABSCESS  11/14/2011   Procedure: IRRIGATION AND DEBRIDEMENT PERIRECTAL ABSCESS;  Surgeon: Zenovia Jarred, MD;  Location:  [facility];  Service: General;  Laterality: N/A;  . LAPAROSCOPIC SIGMOID COLECTOMY N/A 02/23/2018   Procedure: LAPAROSCOPIC ASSISTED SIGMOID COLECTOMY;  Surgeon: Robert Bellow, MD;  Location: [facility];  Service: General;  Laterality: N/A;  . LEFT HEART CATH AND CORONARY ANGIOGRAPHY Left 08/11/2018   Procedure: LEFT HEART CATH AND CORONARY ANGIOGRAPHY;  Surgeon: Wellington Hampshire, MD;  Location: [facility];  Service: Cardiovascular;  Laterality: Left;  . NERVE, TENDON AND ARTERY REPAIR Left 06/11/2013   Procedure: EXPLORATION AND REPAIR LEFT FOREARM;  Surgeon: Schuyler Amor, MD;  Location: [facility];  Service: Orthopedics;  Laterality: Left;  . SHOULDER ARTHROSCOPY WITH ROTATOR CUFF REPAIR AND SUBACROMIAL DECOMPRESSION Left 03/06/2016   Procedure: LEFT SHOULDER ARTHROSCOPY WITH ROTATOR CUFF REPAIR AND SUBACROMIAL DECOMPRESSION, DISTAL CLAVICLE EXCISION;  Surgeon: Leandrew Koyanagi, MD;  Location: [facility];  Service: Orthopedics;  Laterality: Left;    Family History  Problem Relation Age of Onset  . Heart attack Mother        at the age of 29's  . Lung cancer Mother        metestatic, + smoker  . Aneurysm Mother   . Hyperlipidemia Mother   . Hypertension Mother   . Hypertension Father   . Heart attack Maternal Grandmother        at the age of 47's  . Breast cancer Maternal Aunt   . Colon cancer Neg Hx   . Stomach cancer Neg Hx   . Esophageal cancer Neg Hx   . Rectal cancer Neg Hx     Allergies  Allergen Reactions   . Cephalexin Itching  . Doxycycline Nausea And Vomiting  . Dilaudid [Hydromorphone Hcl] Other (See Comments)    Panic attack  . Sulfur Itching and Rash    Current Outpatient Medications on File Prior to Visit  Medication Sig Dispense Refill  . acetaminophen (TYLENOL) 500 MG tablet Take 1,000 mg by mouth every 8 (eight) hours as needed for mild pain.    Marland Kitchen amLODipine (NORVASC) 10 MG tablet Take 1 tablet by mouth once daily for blood pressure. 90 tablet 3  . aspirin 81 MG chewable tablet Chew 81 mg by mouth daily.     . diazepam (VALIUM) 5 MG tablet Take 1 tablet 1 hour prior to MRI and a second immediately before if needed 2 tablet 0  . gabapentin (NEURONTIN) 300 MG capsule Take 1 capsule (300 mg total) by mouth at bedtime. 30 capsule 0  . hydrochlorothiazide (HYDRODIURIL) 25 MG tablet Take 1 tablet (25 mg total) by mouth daily. For blood pressure. 90 tablet 0  . levothyroxine (SYNTHROID) 125 MCG tablet Take 1 tablet by mouth every morning on an empty stomach. No food or other medications for 30 minutes. 30 tablet 1  . lisinopril (ZESTRIL) 40 MG tablet Take 1 tablet (40 mg total) by mouth daily. For blood pressure. 90 tablet 0  . nitroGLYCERIN (NITROSTAT) 0.4 MG SL tablet Place 1 tablet (0.4 mg total) under the tongue every 5 (five) minutes as needed for chest pain. 50 tablet 0  . sertraline (ZOLOFT) 50 MG tablet Take 1 tablet (50 mg total) by mouth daily. For anxiety and depression. 90 tablet 0   No current facility-administered medications on file prior to visit.     BP (!) 140/92   Pulse 64   Temp 97.8 F (36.6 C) (Tympanic)   Ht 5\' 1"  (1.549 m)   Wt 277 lb 12 oz (126 kg)   LMP  06/17/2007   SpO2 98%   BMI 52.48 kg/m    Objective:   Physical Exam  Constitutional: She appears well-nourished.  Neck: Neck supple.  Cardiovascular: Normal rate and regular rhythm.  Respiratory: Effort normal and breath sounds normal.  Skin: Skin is warm and dry. No erythema.  No swelling or  erythema to feet  Psychiatric: She has a normal mood and affect.           Assessment & Plan:

## 2019-03-28 NOTE — Assessment & Plan Note (Signed)
Repeat BMP pending, she has been off of HCTZ for 2 weeks. If renal function has improved we will remain off of HCTZ and consider hydralazine for treatment. Continue ACE and CCB.

## 2019-03-28 NOTE — Assessment & Plan Note (Signed)
Above goal off of HCTZ, also with increase in lisinopril to 40 mg and Amlodipine 10 mg. Start by rechecking BMP today. If renal function remains the same off of HCTZ then re-initiate. If renal function has improved then consider hydralazine.

## 2019-03-29 ENCOUNTER — Other Ambulatory Visit: Payer: Self-pay | Admitting: Primary Care

## 2019-03-29 DIAGNOSIS — I10 Essential (primary) hypertension: Secondary | ICD-10-CM

## 2019-03-29 DIAGNOSIS — M1A9XX0 Chronic gout, unspecified, without tophus (tophi): Secondary | ICD-10-CM

## 2019-03-29 MED ORDER — ALLOPURINOL 100 MG PO TABS: 100.0000 mg | ORAL_TABLET | Freq: Every day | ORAL | 0 refills | Status: DC

## 2019-03-29 MED ORDER — HYDRALAZINE HCL 25 MG PO TABS: 25.0000 mg | ORAL_TABLET | Freq: Two times a day (BID) | ORAL | 0 refills | Status: DC

## 2019-04-20 ENCOUNTER — Telehealth: Payer: Self-pay | Admitting: Orthopedic Surgery

## 2019-04-20 NOTE — Telephone Encounter (Signed)
[name] mother of [name] called to request a call back from Dr Marlou Sa to get an update on her daughter [name] that has been in hospital since 7/2 in [place]. They have not heard anything.  Please call Tameria Patti @ [phone] or [place] [phone]  No dob was given for [employer]

## 2019-04-20 NOTE — Telephone Encounter (Signed)
Sent separate message to Dr Marlou Sa to advise.

## 2019-05-10 DIAGNOSIS — I10 Essential (primary) hypertension: Secondary | ICD-10-CM

## 2019-05-11 MED ORDER — LISINOPRIL 40 MG PO TABS: 40.0000 mg | ORAL_TABLET | Freq: Every day | ORAL | 0 refills | Status: DC

## 2019-05-11 MED ORDER — HYDRALAZINE HCL 25 MG PO TABS: 25.0000 mg | ORAL_TABLET | Freq: Two times a day (BID) | ORAL | 0 refills | Status: DC

## 2019-06-26 DIAGNOSIS — I10 Essential (primary) hypertension: Secondary | ICD-10-CM

## 2019-06-26 DIAGNOSIS — F4323 Adjustment disorder with mixed anxiety and depressed mood: Secondary | ICD-10-CM

## 2019-06-26 DIAGNOSIS — E039 Hypothyroidism, unspecified: Secondary | ICD-10-CM

## 2019-06-27 MED ORDER — LEVOTHYROXINE SODIUM 125 MCG PO TABS: ORAL_TABLET | ORAL | 0 refills | Status: DC

## 2019-06-27 MED ORDER — AMLODIPINE BESYLATE 10 MG PO TABS: 10.0000 mg | ORAL_TABLET | Freq: Every day | ORAL | 0 refills | Status: DC

## 2019-06-27 MED ORDER — SERTRALINE HCL 50 MG PO TABS: 50.0000 mg | ORAL_TABLET | Freq: Every day | ORAL | 0 refills | Status: DC

## 2019-06-27 MED ORDER — LISINOPRIL 40 MG PO TABS: 40.0000 mg | ORAL_TABLET | Freq: Every day | ORAL | 0 refills | Status: DC

## 2019-06-29 ENCOUNTER — Ambulatory Visit: Payer: No Typology Code available for payment source | Admitting: Primary Care

## 2019-08-02 ENCOUNTER — Ambulatory Visit: Payer: No Typology Code available for payment source | Admitting: Primary Care

## 2019-08-17 DIAGNOSIS — E039 Hypothyroidism, unspecified: Secondary | ICD-10-CM

## 2019-08-17 DIAGNOSIS — I10 Essential (primary) hypertension: Secondary | ICD-10-CM

## 2019-08-17 DIAGNOSIS — F4323 Adjustment disorder with mixed anxiety and depressed mood: Secondary | ICD-10-CM

## 2019-08-18 MED ORDER — LEVOTHYROXINE SODIUM 125 MCG PO TABS: ORAL_TABLET | ORAL | 0 refills | Status: DC

## 2019-08-18 MED ORDER — SERTRALINE HCL 50 MG PO TABS: 50.0000 mg | ORAL_TABLET | Freq: Every day | ORAL | 0 refills | Status: DC

## 2019-08-18 MED ORDER — AMLODIPINE BESYLATE 10 MG PO TABS: 10.0000 mg | ORAL_TABLET | Freq: Every day | ORAL | 0 refills | Status: DC

## 2019-08-18 MED ORDER — LISINOPRIL 40 MG PO TABS: 40.0000 mg | ORAL_TABLET | Freq: Every day | ORAL | 0 refills | Status: DC

## 2019-08-24 ENCOUNTER — Ambulatory Visit: Payer: No Typology Code available for payment source | Admitting: Primary Care

## 2019-08-28 DIAGNOSIS — E781 Pure hyperglyceridemia: Secondary | ICD-10-CM

## 2019-08-28 DIAGNOSIS — R7303 Prediabetes: Secondary | ICD-10-CM

## 2019-08-28 DIAGNOSIS — E039 Hypothyroidism, unspecified: Secondary | ICD-10-CM

## 2019-08-28 DIAGNOSIS — E559 Vitamin D deficiency, unspecified: Secondary | ICD-10-CM

## 2019-08-28 DIAGNOSIS — K760 Fatty (change of) liver, not elsewhere classified: Secondary | ICD-10-CM

## 2019-08-28 DIAGNOSIS — I10 Essential (primary) hypertension: Secondary | ICD-10-CM

## 2019-08-28 DIAGNOSIS — M1A9XX0 Chronic gout, unspecified, without tophus (tophi): Secondary | ICD-10-CM

## 2019-08-30 LAB — COMPREHENSIVE METABOLIC PANEL
ALT: 18 IU/L (ref 0–32)
AST: 18 IU/L (ref 0–40)
Albumin/Globulin Ratio: 1.5 (ref 1.2–2.2)
Albumin: 4.4 g/dL (ref 3.8–4.9)
Alkaline Phosphatase: 119 IU/L — ABNORMAL HIGH (ref 39–117)
BUN/Creatinine Ratio: 20 (ref 9–23)
BUN: 25 mg/dL — ABNORMAL HIGH (ref 6–24)
Bilirubin Total: 0.5 mg/dL (ref 0.0–1.2)
CO2: 19 mmol/L — ABNORMAL LOW (ref 20–29)
Calcium: 9.5 mg/dL (ref 8.7–10.2)
Chloride: 103 mmol/L (ref 96–106)
Creatinine, Ser: 1.27 mg/dL — ABNORMAL HIGH (ref 0.57–1.00)
GFR calc Af Amer: 55 mL/min/{1.73_m2} — ABNORMAL LOW (ref >59)
GFR calc non Af Amer: 48 mL/min/{1.73_m2} — ABNORMAL LOW (ref >59)
Globulin, Total: 3 g/dL (ref 1.5–4.5)
Glucose: 99 mg/dL (ref 65–99)
Potassium: 5.1 mmol/L (ref 3.5–5.2)
Sodium: 140 mmol/L (ref 134–144)
Total Protein: 7.4 g/dL (ref 6.0–8.5)

## 2019-08-30 LAB — URIC ACID: Uric Acid: 8.3 mg/dL — ABNORMAL HIGH (ref 2.5–7.1)

## 2019-08-30 LAB — HEMOGLOBIN A1C
Est. average glucose Bld gHb Est-mCnc: 108 mg/dL
Hgb A1c MFr Bld: 5.4 % (ref 4.8–5.6)

## 2019-08-30 LAB — VITAMIN D 25 HYDROXY (VIT D DEFICIENCY, FRACTURES): Vit D, 25-Hydroxy: 19.3 ng/mL — ABNORMAL LOW (ref 30.0–100.0)

## 2019-08-30 LAB — TSH: TSH: 1.38 u[IU]/mL (ref 0.450–4.500)

## 2019-09-15 ENCOUNTER — Other Ambulatory Visit: Payer: Self-pay

## 2019-09-15 ENCOUNTER — Ambulatory Visit (INDEPENDENT_AMBULATORY_CARE_PROVIDER_SITE_OTHER): Payer: No Typology Code available for payment source | Admitting: Primary Care

## 2019-09-15 ENCOUNTER — Encounter: Payer: Self-pay | Admitting: Primary Care

## 2019-09-15 VITALS — BP 136/84 | HR 70 | Temp 97.2°F | Ht 61.0 in | Wt 272.8 lb

## 2019-09-15 DIAGNOSIS — M1A9XX0 Chronic gout, unspecified, without tophus (tophi): Secondary | ICD-10-CM

## 2019-09-15 DIAGNOSIS — I10 Essential (primary) hypertension: Secondary | ICD-10-CM

## 2019-09-15 DIAGNOSIS — Z23 Encounter for immunization: Secondary | ICD-10-CM

## 2019-09-15 DIAGNOSIS — Z86010 Personal history of colonic polyps: Secondary | ICD-10-CM

## 2019-09-15 DIAGNOSIS — M5441 Lumbago with sciatica, right side: Secondary | ICD-10-CM

## 2019-09-15 DIAGNOSIS — K56699 Other intestinal obstruction unspecified as to partial versus complete obstruction: Secondary | ICD-10-CM

## 2019-09-15 DIAGNOSIS — E781 Pure hyperglyceridemia: Secondary | ICD-10-CM

## 2019-09-15 DIAGNOSIS — E039 Hypothyroidism, unspecified: Secondary | ICD-10-CM

## 2019-09-15 DIAGNOSIS — R7303 Prediabetes: Secondary | ICD-10-CM

## 2019-09-15 DIAGNOSIS — E559 Vitamin D deficiency, unspecified: Secondary | ICD-10-CM

## 2019-09-15 DIAGNOSIS — F4323 Adjustment disorder with mixed anxiety and depressed mood: Secondary | ICD-10-CM

## 2019-09-15 DIAGNOSIS — N1831 Chronic kidney disease, stage 3a: Secondary | ICD-10-CM

## 2019-09-15 DIAGNOSIS — G8929 Other chronic pain: Secondary | ICD-10-CM

## 2019-09-15 MED ORDER — LISINOPRIL 40 MG PO TABS: 40.0000 mg | ORAL_TABLET | Freq: Every day | ORAL | 3 refills | Status: AC

## 2019-09-15 MED ORDER — SERTRALINE HCL 100 MG PO TABS: 100.0000 mg | ORAL_TABLET | Freq: Every day | ORAL | 0 refills | Status: DC

## 2019-09-15 MED ORDER — HYDRALAZINE HCL 25 MG PO TABS: 25.0000 mg | ORAL_TABLET | Freq: Two times a day (BID) | ORAL | 3 refills | Status: AC

## 2019-09-15 MED ORDER — GABAPENTIN 300 MG PO CAPS: 300.0000 mg | ORAL_CAPSULE | Freq: Every day | ORAL | 0 refills | Status: AC

## 2019-09-15 MED ORDER — ALLOPURINOL 100 MG PO TABS: 100.0000 mg | ORAL_TABLET | Freq: Every day | ORAL | 3 refills | Status: AC

## 2019-09-15 MED ORDER — VITAMIN D (ERGOCALCIFEROL) 1.25 MG (50000 UNIT) PO CAPS: ORAL_CAPSULE | ORAL | 0 refills | Status: AC

## 2019-09-15 MED ORDER — AMLODIPINE BESYLATE 10 MG PO TABS: 10.0000 mg | ORAL_TABLET | Freq: Every day | ORAL | 3 refills | Status: AC

## 2019-09-15 MED ORDER — LEVOTHYROXINE SODIUM 125 MCG PO TABS: ORAL_TABLET | ORAL | 3 refills | Status: AC

## 2019-09-15 NOTE — Assessment & Plan Note (Signed)
Treat with Rx for vitamin D 50,000 units weekly. Repeat labs in 2 months.

## 2019-09-15 NOTE — Assessment & Plan Note (Signed)
Recent uric acid level above goal, has been without allopurinol for over one month.   Refills provided today, she is not having an acute attack. Continue 100 mg daily. Repeat uric acid level in 2 months.

## 2019-09-15 NOTE — Assessment & Plan Note (Signed)
Resolved on recent labs. Continue to monitor.

## 2019-09-15 NOTE — Assessment & Plan Note (Signed)
Overall stable. Continue BP management, encouraged weight loss. Following with nephrology. Managed on ACE.

## 2019-09-15 NOTE — Assessment & Plan Note (Signed)
Recent TSH level at goal. Compliant to levothyroxine and is taking appropriately. Continue levothyroxine 125 mcg.

## 2019-09-15 NOTE — Patient Instructions (Signed)
Resume your allopurinol 100 mg tablets for gout prevention.  Use the gabapentin as needed for back pain.  Follow up with the kidney doctor as discussed.  Schedule a nurse visit and lab appointment for 2 months for Shingles shot and gout lab check.  It was a pleasure to see you today!

## 2019-09-15 NOTE — Assessment & Plan Note (Signed)
Due for repeat colonoscopy in 2024

## 2019-09-15 NOTE — Assessment & Plan Note (Signed)
Increased symptoms due to unfortunate circumstances with family health. Increase dose to 100 mg, she will update.

## 2019-09-15 NOTE — Addendum Note (Signed)
Addended by: Jacqualin Combes on: 09/15/2019 12:42 PM   Modules accepted: Orders

## 2019-09-15 NOTE — Assessment & Plan Note (Signed)
Borderline high in the office today, improved with home readings. Continue current regimen. Refills provided.

## 2019-09-15 NOTE — Assessment & Plan Note (Signed)
Discussed the importance of a healthy diet and regular exercise in order for weight loss, and to reduce the risk of any potential medical problems.  Repeat lipids in 2 months.

## 2019-09-15 NOTE — Progress Notes (Signed)
Subjective:    Patient ID: [name], female    DOB: Mar 20, 1961, 58 y.o.   MRN: [id number]  HPI  [name] is a 58 year old female with a history of Hypertension, hepatic steatosis, colon stricture, hypothyroidism, CKD Stage III, prediabetes who presents today for follow up.  1) Hypothyroidism: Currently managed on levothyroxine 125 mcg. Recent TSH of 1.38. She is taking her levothyroxine every morning without food, water only. No other medications or food for two hours.   2) Vitamin D Deficiency: History of CKD III. Recent vitamin D level of 19.3. She is not taking an supplemental vitamin D.   3) CKD Stage III: Recent CMP with creatinine of 1.27, GFR of 48. Following with nephrology, plans on scheduling an appointment soon.   4) Chronic Gout: Recent uric acid level of 8.3. She's been compliant to her allopurinol 100 mg except for the last month when she ran out. She has noticed increased aches in her feet. She had labs completed after being out of her allopurinol.   5) Essential Hypertension: Currently managed on hydralazine 25 mg BID, lisinopril 40 mg daily, amlodipine 10 mg daily. She denies dizziness, chest pain with mild exertion, headaches.   BP Readings from Last 3 Encounters:  09/15/19 136/84  03/28/19 (!) 140/92  03/16/19 138/86   6) Chronic Back Pain: Currently managed on PRN gabapentin 300 mg HS, one to two times weekly on average. She is needing a refill today.  7) Anxiety and Depression: Currently managed on Zoloft 50 mg but would like to discuss a dose increase. Increased symptoms of depression and anxiety since she's been tending to her daughter who had a major car accident, also dealing with other family health problems. She would like to increase her Zoloft dose given recent circumstances. Denies SI/HI.  Review of Systems  Respiratory: Negative for shortness of breath.   Cardiovascular: Negative for chest pain.  Musculoskeletal: Positive for arthralgias and  back pain.  Neurological: Negative for dizziness and headaches.  Psychiatric/Behavioral:       See HPI       Past Medical History:  Diagnosis Date  . Acute kidney injury ([place]) 05/19/2017  . Acute myofascial strain of lumbosacral region 08/20/2016  . Allergic rhinitis   . ANEMIA, IRON DEFICIENCY, history of 04/05/2008   Qualifier: Diagnosis of  By: Philbert Riser MD, Manrique    . Anginal pain ([place])    a. 08/2006 nl stress testing; b. 07/2018 Cath: Nl cors, EF 55-65%.  . Arthritis   . BPPV (benign paroxysmal positional vertigo) 01/07/2017  . CHOLECYSTECTOMY, HX OF    Annotation: 2000 Qualifier: Diagnosis of  By: Eddie Dibbles MD, Bhakti    . Chronic left maxillary sinus disease 09/22/2012  . Chronic low back pain 11/08/2015  . CKD (chronic kidney disease), stage III ([place])    a. Followed by [facility] in [place].  Marland Kitchen Depression   . Diarrhea 05/19/2017  . Diverticulitis   . Essential hypertension 02/09/2014  . Family history of leukemia 09/22/2012   She states that both her uncle and his son was diagnosed with Leukemia. She would like to know more information about the leukemia.       Marland Kitchen GERD (gastroesophageal reflux disease)    no meds  . Hepatic steatosis 09/22/2012   Incidental finding on abd CT in 2011   . History of blood transfusion 03/2008   Requried 2u prbc; menometrorrhagia, uterine fibroids.  . History of methicillin resistant staphylococcus aureus (MRSA)  2017   PCR+  . Hx of colonic polyp 10/26/2017  . Hypertension   . Hypertriglyceridemia    164 - 2/11  . Hypothyroidism   . Injury of left rotator cuff 12/02/2016  . Insomnia   . INSOMNIA UNSPECIFIED 05/01/2010   Qualifier: Diagnosis of  By: Shon Baton, MD, Janett Billow    . Metabolic syndrome [id number]  . Nausea 05/19/2017  . Nausea vomiting and diarrhea   . 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 Obesity    250 lbs 8/11  . OBESITY 08/31/2006   Annotation: Wt. 248 lbs. 11/2004 Qualifier: Diagnosis of  By: Eddie Dibbles MD, Bhakti    . Panic  disorder 11/05  . PANIC DISORDER 08/31/2006   Diagnosed 11/05, well-controlled with Paxil, patient also takes albuterol when she has a panic attack, maybe once/month   . Pectus excavatum 09/22/2012   Incidental finding on CXR in 2013.    Marland Kitchen Perirectal abscess 11/14/2011  . Prediabetes 09/22/2012  . Preventative health care 02/09/2014  . Proteinuria 07/17/2012  . Urinary incontinence without sensory awareness 12/17/2016  . Venous (peripheral) insufficiency 11/08/2015     Social History   Socioeconomic History  . Marital status: Married    Spouse name: Not on file  . Number of children: Not on file  . Years of education: Not on file  . Highest education level: Not on file  Occupational History  . Not on file  Social Needs  . Financial resource strain: Not on file  . Food insecurity    Worry: Not on file    Inability: Not on file  . Transportation needs    Medical: Not on file    Non-medical: Not on file  Tobacco Use  . Smoking status: Never Smoker  . Smokeless tobacco: Never Used  Substance and Sexual Activity  . Alcohol use: No    Alcohol/week: 0.0 standard drinks  . Drug use: No  . Sexual activity: Yes    Birth control/protection: None  Lifestyle  . Physical activity    Days per week: Not on file    Minutes per session: Not on file  . Stress: Not on file  Relationships  . Social Herbalist on phone: Not on file    Gets together: Not on file    Attends religious service: Not on file    Active member of club or organization: Not on file    Attends meetings of clubs or organizations: Not on file    Relationship status: Not on file  . Intimate partner violence    Fear of current or ex partner: Not on file    Emotionally abused: Not on file    Physically abused: Not on file    Forced sexual activity: Not on file  Other Topics Concern  . Not on file  Social History Narrative   Currently unemployed. Starting college in Aug for accounting.  Married, has 2  children lives with her youngest son.      Past Surgical History:  Procedure Laterality Date  . ABDOMINAL HYSTERECTOMY  04/2007   total abdominal hysterectomy ( cervix) with bilateral salpingo-oopherectomy  . APPENDECTOMY  02/23/2018   Procedure: APPENDECTOMY;  Surgeon: Robert Bellow, MD;  Location: [facility];  Service: General;;  . CHOLECYSTECTOMY  07/30/1999   Diagnosis of  By: Eddie Dibbles MD, Bhakti    . DILATION AND CURETTAGE OF UTERUS  1987  . INCISION AND DRAINAGE ABSCESS  11/02/2012   Procedure: INCISION AND DRAINAGE ABSCESS;  Surgeon:  Odis Hollingshead, MD;  Location: [facility];  Service: General;  Laterality: N/A;  I&D anorectal abscess  . INCISION AND DRAINAGE PERIRECTAL ABSCESS  11/14/2011   Procedure: IRRIGATION AND DEBRIDEMENT PERIRECTAL ABSCESS;  Surgeon: Zenovia Jarred, MD;  Location: [facility];  Service: General;  Laterality: N/A;  . LAPAROSCOPIC SIGMOID COLECTOMY N/A 02/23/2018   Procedure: LAPAROSCOPIC ASSISTED SIGMOID COLECTOMY;  Surgeon: Robert Bellow, MD;  Location: [facility];  Service: General;  Laterality: N/A;  . LEFT HEART CATH AND CORONARY ANGIOGRAPHY Left 08/11/2018   Procedure: LEFT HEART CATH AND CORONARY ANGIOGRAPHY;  Surgeon: Wellington Hampshire, MD;  Location: [facility];  Service: Cardiovascular;  Laterality: Left;  . NERVE, TENDON AND ARTERY REPAIR Left 06/11/2013   Procedure: EXPLORATION AND REPAIR LEFT FOREARM;  Surgeon: Schuyler Amor, MD;  Location: [facility];  Service: Orthopedics;  Laterality: Left;  . SHOULDER ARTHROSCOPY WITH ROTATOR CUFF REPAIR AND SUBACROMIAL DECOMPRESSION Left 03/06/2016   Procedure: LEFT SHOULDER ARTHROSCOPY WITH ROTATOR CUFF REPAIR AND SUBACROMIAL DECOMPRESSION, DISTAL CLAVICLE EXCISION;  Surgeon: Leandrew Koyanagi, MD;  Location: [facility];  Service: Orthopedics;  Laterality: Left;    Family History  Problem Relation Age of Onset  . Heart attack Mother        at the age of 68's  . Lung cancer Mother        metestatic, + smoker  .  Aneurysm Mother   . Hyperlipidemia Mother   . Hypertension Mother   . Hypertension Father   . Heart attack Maternal Grandmother        at the age of 43's  . Breast cancer Maternal Aunt   . Colon cancer Neg Hx   . Stomach cancer Neg Hx   . Esophageal cancer Neg Hx   . Rectal cancer Neg Hx     Allergies  Allergen Reactions  . Cephalexin Itching  . Doxycycline Nausea And Vomiting  . Dilaudid [Hydromorphone Hcl] Other (See Comments)    Panic attack  . Sulfur Itching and Rash    Current Outpatient Medications on File Prior to Visit  Medication Sig Dispense Refill  . acetaminophen (TYLENOL) 500 MG tablet Take 1,000 mg by mouth every 8 (eight) hours as needed for mild pain.    Marland Kitchen aspirin 81 MG chewable tablet Chew 81 mg by mouth daily.     . diazepam (VALIUM) 5 MG tablet Take 1 tablet 1 hour prior to MRI and a second immediately before if needed 2 tablet 0  . nitroGLYCERIN (NITROSTAT) 0.4 MG SL tablet Place 1 tablet (0.4 mg total) under the tongue every 5 (five) minutes as needed for chest pain. 50 tablet 0   No current facility-administered medications on file prior to visit.     BP 136/84   Pulse 70   Temp (!) 97.2 F (36.2 C) (Temporal)   Ht 5\' 1"  (1.549 m)   Wt 272 lb 12 oz (123.7 kg)   LMP 06/17/2007   SpO2 99%   BMI 51.54 kg/m    Objective:   Physical Exam  Constitutional: She appears well-nourished.  Neck: Neck supple.  Cardiovascular: Normal rate and regular rhythm.  Respiratory: Effort normal and breath sounds normal.  Skin: Skin is warm and dry.  Psychiatric: She has a normal mood and affect.           Assessment & Plan:

## 2019-11-29 ENCOUNTER — Ambulatory Visit: Payer: No Typology Code available for payment source

## 2019-11-29 ENCOUNTER — Other Ambulatory Visit: Payer: No Typology Code available for payment source

## 2019-12-30 ENCOUNTER — Other Ambulatory Visit: Payer: Self-pay | Admitting: Primary Care

## 2019-12-30 DIAGNOSIS — F4323 Adjustment disorder with mixed anxiety and depressed mood: Secondary | ICD-10-CM

## 2022-12-10 ENCOUNTER — Encounter: Payer: Self-pay | Admitting: Internal Medicine
